# Patient Record
Sex: Male | Born: 1965 | Race: White | Hispanic: No | Marital: Single | Smoking: Current every day smoker
Health system: Southern US, Community
[De-identification: ages and names within clinical notes are randomized; demographics above are authoritative.]

## PROBLEM LIST (undated history)

## (undated) DIAGNOSIS — F1490 Cocaine use, unspecified, uncomplicated: Secondary | ICD-10-CM

## (undated) DIAGNOSIS — E039 Hypothyroidism, unspecified: Secondary | ICD-10-CM

## (undated) DIAGNOSIS — F1010 Alcohol abuse, uncomplicated: Secondary | ICD-10-CM

## (undated) DIAGNOSIS — J449 Chronic obstructive pulmonary disease, unspecified: Secondary | ICD-10-CM

## (undated) DIAGNOSIS — F32A Depression, unspecified: Secondary | ICD-10-CM

## (undated) DIAGNOSIS — F329 Major depressive disorder, single episode, unspecified: Secondary | ICD-10-CM

## (undated) DIAGNOSIS — R569 Unspecified convulsions: Secondary | ICD-10-CM

## (undated) HISTORY — PX: BACK SURGERY: SHX140

---

## 2016-09-08 ENCOUNTER — Emergency Department
Admission: EM | Admit: 2016-09-08 | Discharge: 2016-09-09 | Disposition: A | Discharge status: Home / Self Care | Payer: Medicaid Other | Attending: Emergency Medicine | Admitting: Emergency Medicine

## 2016-09-08 ENCOUNTER — Encounter: Payer: Self-pay | Admitting: Emergency Medicine

## 2016-09-08 DIAGNOSIS — E872 Acidosis, unspecified: Secondary | ICD-10-CM

## 2016-09-08 DIAGNOSIS — F10920 Alcohol use, unspecified with intoxication, uncomplicated: Secondary | ICD-10-CM

## 2016-09-08 DIAGNOSIS — Z9114 Patient's other noncompliance with medication regimen: Secondary | ICD-10-CM | POA: Insufficient documentation

## 2016-09-08 DIAGNOSIS — F10129 Alcohol abuse with intoxication, unspecified: Secondary | ICD-10-CM | POA: Insufficient documentation

## 2016-09-08 DIAGNOSIS — E162 Hypoglycemia, unspecified: Secondary | ICD-10-CM | POA: Insufficient documentation

## 2016-09-08 DIAGNOSIS — F17200 Nicotine dependence, unspecified, uncomplicated: Secondary | ICD-10-CM | POA: Insufficient documentation

## 2016-09-08 HISTORY — DX: Unspecified convulsions: R56.9

## 2016-09-08 HISTORY — DX: Alcohol abuse, uncomplicated: F10.10

## 2016-09-08 LAB — CBC WITH DIFFERENTIAL/PLATELET
BASOS PCT: 2 %
Basophils Absolute: 0.2 10*3/uL — ABNORMAL HIGH (ref 0–0.1)
EOS PCT: 1 %
Eosinophils Absolute: 0.2 10*3/uL (ref 0–0.7)
HEMATOCRIT: 49.5 % (ref 40.0–52.0)
HEMOGLOBIN: 16.5 g/dL (ref 13.0–18.0)
Lymphocytes Relative: 38 %
Lymphs Abs: 4.4 10*3/uL — ABNORMAL HIGH (ref 1.0–3.6)
MCH: 30.2 pg (ref 26.0–34.0)
MCHC: 33.3 g/dL (ref 32.0–36.0)
MCV: 90.6 fL (ref 80.0–100.0)
MONO ABS: 0.6 10*3/uL (ref 0.2–1.0)
Monocytes Relative: 5 %
Neutro Abs: 6.3 10*3/uL (ref 1.4–6.5)
Neutrophils Relative %: 54 %
Platelets: 371 10*3/uL (ref 150–440)
RBC: 5.47 MIL/uL (ref 4.40–5.90)
RDW: 15.5 % — ABNORMAL HIGH (ref 11.5–14.5)
WBC: 11.6 10*3/uL — AB (ref 3.8–10.6)

## 2016-09-08 LAB — URINE DRUG SCREEN, QUALITATIVE (ARMC ONLY)
Amphetamines, Ur Screen: NOT DETECTED
BARBITURATES, UR SCREEN: NOT DETECTED
Benzodiazepine, Ur Scrn: NOT DETECTED
CANNABINOID 50 NG, UR ~~LOC~~: NOT DETECTED
COCAINE METABOLITE, UR ~~LOC~~: NOT DETECTED
MDMA (Ecstasy)Ur Screen: NOT DETECTED
METHADONE SCREEN, URINE: NOT DETECTED
OPIATE, UR SCREEN: NOT DETECTED
PHENCYCLIDINE (PCP) UR S: NOT DETECTED
Tricyclic, Ur Screen: NOT DETECTED

## 2016-09-08 LAB — COMPREHENSIVE METABOLIC PANEL
ALBUMIN: 4.2 g/dL (ref 3.5–5.0)
ALT: 18 U/L (ref 17–63)
ANION GAP: 16 — AB (ref 5–15)
AST: 27 U/L (ref 15–41)
Alkaline Phosphatase: 82 U/L (ref 38–126)
BILIRUBIN TOTAL: 0.4 mg/dL (ref 0.3–1.2)
BUN: 14 mg/dL (ref 6–20)
CO2: 18 mmol/L — ABNORMAL LOW (ref 22–32)
Calcium: 8.5 mg/dL — ABNORMAL LOW (ref 8.9–10.3)
Chloride: 109 mmol/L (ref 101–111)
Creatinine, Ser: 0.83 mg/dL (ref 0.61–1.24)
GFR calc non Af Amer: >60 mL/min (ref >60)
GLUCOSE: 70 mg/dL (ref 65–99)
POTASSIUM: 3.2 mmol/L — AB (ref 3.5–5.1)
Sodium: 143 mmol/L (ref 135–145)
TOTAL PROTEIN: 7.8 g/dL (ref 6.5–8.1)

## 2016-09-08 LAB — GLUCOSE, CAPILLARY
GLUCOSE-CAPILLARY: 192 mg/dL — AB (ref 65–99)
Glucose-Capillary: 44 mg/dL — CL (ref 65–99)

## 2016-09-08 LAB — LIPASE, BLOOD: LIPASE: 19 U/L (ref 11–51)

## 2016-09-08 LAB — OSMOLALITY: Osmolality: 378 mOsm/kg (ref 275–295)

## 2016-09-08 LAB — ACETAMINOPHEN LEVEL: Acetaminophen (Tylenol), Serum: <10 ug/mL — ABNORMAL LOW (ref 10–30)

## 2016-09-08 LAB — SALICYLATE LEVEL

## 2016-09-08 LAB — ETHANOL: ALCOHOL ETHYL (B): 313 mg/dL — AB (ref <5)

## 2016-09-08 MED ORDER — DEXTROSE 50 % IV SOLN
INTRAVENOUS | Status: AC
  Administered 2016-09-08: 50 mL via INTRAVENOUS
  Filled 2016-09-08: qty 50

## 2016-09-08 MED ORDER — SODIUM CHLORIDE 0.9 % IV BOLUS (SEPSIS)
1000.0000 mL | Freq: Once | INTRAVENOUS | Status: AC
  Administered 2016-09-08: 1000 mL via INTRAVENOUS

## 2016-09-08 MED ORDER — THIAMINE HCL 100 MG/ML IJ SOLN
100.0000 mg | Freq: Once | INTRAMUSCULAR | Status: AC
  Administered 2016-09-08: 100 mg via INTRAVENOUS
  Filled 2016-09-08: qty 2

## 2016-09-08 MED ORDER — FOLIC ACID 5 MG/ML IJ SOLN
1.0000 mg | Freq: Every day | INTRAMUSCULAR | Status: DC
  Administered 2016-09-08: 1 mg via INTRAVENOUS
  Filled 2016-09-08 (×2): qty 0.2

## 2016-09-08 MED ORDER — DEXTROSE 50 % IV SOLN
50.0000 mL | Freq: Once | INTRAVENOUS | Status: AC
  Administered 2016-09-08: 50 mL via INTRAVENOUS

## 2016-09-08 NOTE — ED Notes (Signed)
Pt eating sandwich tray and water to drink

## 2016-09-08 NOTE — ED Provider Notes (Signed)
[facility] Emergency Department Provider Note  ____________________________________________  Time seen: Approximately 7:18 PM  I have reviewed the triage vital signs and the nursing notes.   HISTORY  Chief Complaint Alcohol Intoxication and Hypoglycemia Level 5 caveat:  Portions of the history and physical were unable to be obtained due to: Altered mental status due to intoxication    HPI Rodney Watson is a 51 y.o. male who reports drinking 2.5 bottles of Listerine today. Reports she normally drinks a gallon of liquor daily. A seizure earlier today and a month ago due to medication noncompliance. He supposed to be on Depakote and Dilantin. Denies any acute pain anywhere. Not short of breath no fever or vomiting.     Past Medical History:  Diagnosis Date  . Alcohol abuse   . Seizures (HCC)      There are no active problems to display for this patient.    History reviewed. No pertinent surgical history.   Prior to Admission medications   Not on File  None currently, noncompliant with Depakote and phenytoin   Allergies Chocolate   No family history on file.  Social History Social History  Substance Use Topics  . Smoking status: Current Some Day Smoker  . Smokeless tobacco: Current User  . Alcohol use Yes  Marijuana use  Review of Systems  Constitutional:   No fever or chills.  ENT:   No sore throat. No rhinorrhea. Cardiovascular:   No chest pain or syncope. Respiratory:   No dyspnea or cough. Gastrointestinal:   Negative for abdominal pain, vomiting and diarrhea.  Musculoskeletal:   Negative for focal pain or swelling All other systems reviewed and are negative except as documented above in ROS and HPI.  ____________________________________________   PHYSICAL EXAM:  VITAL SIGNS: ED Triage Vitals  Enc Vitals Group     BP 09/08/16 1819 116/73     Pulse Rate 09/08/16 1819 (!) 103     Resp 09/08/16 1819 16     Temp 09/08/16  1819 97.9 F (36.6 C)     Temp Source 09/08/16 1819 Oral     SpO2 09/08/16 1818 96 %     Weight --      Height --      Head Circumference --      Peak Flow --      Pain Score --      Pain Loc --      Pain Edu? --      Excl. in GC? --     Vital signs reviewed, nursing assessments reviewed.   Constitutional:   Alert and orientedTo person and place. Not in distress. Eyes:   No scleral icterus.  EOMI. No nystagmus. No conjunctival pallor. PERRL. ENT   Head:   Normocephalic and atraumatic.   Nose:   No congestion/rhinnorhea.    Mouth/Throat:   Dry mucous membranes, no pharyngeal erythema. No peritonsillar mass. Minty breath   Neck:   No meningismus. Full ROM Hematological/Lymphatic/Immunilogical:   No cervical lymphadenopathy. Cardiovascular:   Tachycardia heart rate 105. Symmetric bilateral radial and DP pulses.  No murmurs.  Respiratory:   Normal respiratory effort without tachypnea/retractions. Breath sounds are clear and equal bilaterally. No wheezes/rales/rhonchi. Gastrointestinal:   Soft and nontender. Non distended. There is no CVA tenderness.  No rebound, rigidity, or guarding. Genitourinary:   deferred Musculoskeletal:   Normal range of motion in all extremities. No joint effusions.  No lower extremity tenderness.  No edema. Neurologic:   Normal speech  and language.  Motor grossly intact. No gross focal neurologic deficits are appreciated.  Skin:    Skin is warm, dry and intact. No rash noted.  No petechiae, purpura, or bullae.  ____________________________________________    LABS (pertinent positives/negatives) (all labs ordered are listed, but only abnormal results are displayed) Labs Reviewed  ACETAMINOPHEN LEVEL - Abnormal; Notable for the following:       Result Value   Acetaminophen (Tylenol), Serum <10 (*)    All other components within normal limits  COMPREHENSIVE METABOLIC PANEL - Abnormal; Notable for the following:    Potassium 3.2 (*)    CO2  18 (*)    Calcium 8.5 (*)    Anion gap 16 (*)    All other components within normal limits  ETHANOL - Abnormal; Notable for the following:    Alcohol, Ethyl (B) 313 (*)    All other components within normal limits  CBC WITH DIFFERENTIAL/PLATELET - Abnormal; Notable for the following:    WBC 11.6 (*)    RDW 15.5 (*)    Lymphs Abs 4.4 (*)    Basophils Absolute 0.2 (*)    All other components within normal limits  GLUCOSE, CAPILLARY - Abnormal; Notable for the following:    Glucose-Capillary 44 (*)    All other components within normal limits  LIPASE, BLOOD  SALICYLATE LEVEL  URINE DRUG SCREEN, QUALITATIVE ([facility] ONLY)  OSMOLALITY   ____________________________________________   EKG    ____________________________________________    RADIOLOGY  No results found.  ____________________________________________   PROCEDURES Procedures CRITICAL CARE Performed by: Scotty Court, Jaxzen Vanhorn   Total critical care time: 35 minutes  Critical care time was exclusive of separately billable procedures and treating other patients.  Critical care was necessary to treat or prevent imminent or life-threatening deterioration.  Critical care was time spent personally by me on the following activities: development of treatment plan with patient and/or surrogate as well as nursing, discussions with consultants, evaluation of patient's response to treatment, examination of patient, obtaining history from patient or surrogate, ordering and performing treatments and interventions, ordering and review of laboratory studies, ordering and review of radiographic studies, pulse oximetry and re-evaluation of patient's condition.  ____________________________________________   INITIAL IMPRESSION / ASSESSMENT AND PLAN / ED COURSE  Pertinent labs & imaging results that were available during my care of the patient were reviewed by me and considered in my medical decision making (see chart for  details).  Patient presents with intoxication, concern for salicylate toxicity as well. History not consistent with other toxic alcohol ingestion. We'll need to monitor in the ED until clinically sober given his homelessness and lack of other people to care for him.  Clinical Course as of Sep 08 2340  Sat Sep 08, 2016  1938 OBSERVATION CARE: This patient is being placed under observation care for the following reasons: Questionable overdose observed to r/o significant toxicity   [PS]  2000 Serum Osm gap is -1.5, within normal range. No evidence of unmeasured osmoles such as toxic alcohol at this time. Osmolality: (!!) 378 [PS]  2340 END OF OBSERVATION STATUS: After an appropriate period of observation, this patient is clinically progressing as expected and likely to be discharged due to improving mental status and lack of neurologic deterioration or other signs of toxicity.  Not yet clinically sober. Care signed out to Dr. Dolores Frame to continue monitoring to sobriety so that patient will be able to care for himself once discharged.   [PS]    Clinical Course User  Index [PS] Sharman Cheek, MD     ____________________________________________   FINAL CLINICAL IMPRESSION(S) / ED DIAGNOSES  Final diagnoses:  Alcoholic intoxication without complication (HCC)  Metabolic acidosis  Hypoglycemia      New Prescriptions   No medications on file     Portions of this note were generated with dragon dictation software. Dictation errors may occur despite best attempts at proofreading.    Sharman Cheek, MD 09/08/16 [phone]
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
## 2016-09-09 NOTE — ED Notes (Addendum)
Pt provided breakfast tray upon discharge.  Pt A/Ox4 and ambulatory with steady gait.  Pt provided phone to call his cousin prior to being placed in lobby.

## 2016-09-09 NOTE — ED Notes (Signed)
Pt up to bathroom with steady gait and then returned to bed. No complaints voiced at this time.

## 2016-09-09 NOTE — ED Notes (Signed)
Patient is resting at this time.

## 2016-09-09 NOTE — ED Provider Notes (Signed)
-----------------------------------------   7:04 AM on 09/09/2016 -----------------------------------------  Patient slept overnight.He did get up to use the restroom but promptly fell back asleep. Anticipate discharge home once patient is sober and ambulatory with steady gait. Care transferred to Dr. Darnelle CatalanMalinda.   Irean HongSung, Inza Mikrut J, MD 09/09/16 [phone]

## 2016-09-09 NOTE — ED Provider Notes (Signed)
Patient is now awake alert feeling well and called his relatives to come get him I will discharge him.   Rodney Watson, [name], MD 09/09/16 [phone]

## 2016-09-09 NOTE — Discharge Instructions (Signed)
Please return for any further problems. Be sure to eat regularly. Follow-up with [facility] if needed and AA.

## 2016-09-14 ENCOUNTER — Encounter: Payer: Self-pay | Admitting: Emergency Medicine

## 2016-09-14 ENCOUNTER — Emergency Department
Admission: EM | Admit: 2016-09-14 | Discharge: 2016-09-14 | Disposition: A | Discharge status: Home / Self Care | Payer: Medicaid Other | Attending: Emergency Medicine | Admitting: Emergency Medicine

## 2016-09-14 DIAGNOSIS — F17200 Nicotine dependence, unspecified, uncomplicated: Secondary | ICD-10-CM | POA: Diagnosis not present

## 2016-09-14 DIAGNOSIS — F10920 Alcohol use, unspecified with intoxication, uncomplicated: Secondary | ICD-10-CM | POA: Insufficient documentation

## 2016-09-14 DIAGNOSIS — T50902A Poisoning by unspecified drugs, medicaments and biological substances, intentional self-harm, initial encounter: Secondary | ICD-10-CM | POA: Diagnosis not present

## 2016-09-14 DIAGNOSIS — R4182 Altered mental status, unspecified: Secondary | ICD-10-CM | POA: Diagnosis present

## 2016-09-14 DIAGNOSIS — F17290 Nicotine dependence, other tobacco product, uncomplicated: Secondary | ICD-10-CM | POA: Diagnosis not present

## 2016-09-14 LAB — COMPREHENSIVE METABOLIC PANEL
ALBUMIN: 3.9 g/dL (ref 3.5–5.0)
ALT: 14 U/L — ABNORMAL LOW (ref 17–63)
ANION GAP: 11 (ref 5–15)
AST: 27 U/L (ref 15–41)
Alkaline Phosphatase: 72 U/L (ref 38–126)
BILIRUBIN TOTAL: 1.1 mg/dL (ref 0.3–1.2)
BUN: 14 mg/dL (ref 6–20)
CALCIUM: 8.3 mg/dL — AB (ref 8.9–10.3)
CO2: 24 mmol/L (ref 22–32)
Chloride: 100 mmol/L — ABNORMAL LOW (ref 101–111)
Creatinine, Ser: 0.75 mg/dL (ref 0.61–1.24)
Glucose, Bld: 180 mg/dL — ABNORMAL HIGH (ref 65–99)
POTASSIUM: 3.1 mmol/L — AB (ref 3.5–5.1)
Sodium: 135 mmol/L (ref 135–145)
Total Protein: 6.8 g/dL (ref 6.5–8.1)

## 2016-09-14 LAB — CBC WITH DIFFERENTIAL/PLATELET
Basophils Absolute: 0 10*3/uL (ref 0–0.1)
Basophils Relative: 0 %
Eosinophils Absolute: 0 10*3/uL (ref 0–0.7)
Eosinophils Relative: 1 %
HEMATOCRIT: 42.1 % (ref 40.0–52.0)
HEMOGLOBIN: 14.4 g/dL (ref 13.0–18.0)
LYMPHS ABS: 1.1 10*3/uL (ref 1.0–3.6)
LYMPHS PCT: 19 %
MCH: 31.1 pg (ref 26.0–34.0)
MCHC: 34.2 g/dL (ref 32.0–36.0)
MCV: 91.1 fL (ref 80.0–100.0)
MONO ABS: 0.4 10*3/uL (ref 0.2–1.0)
Monocytes Relative: 7 %
NEUTROS ABS: 4.2 10*3/uL (ref 1.4–6.5)
NEUTROS PCT: 73 %
Platelets: 202 10*3/uL (ref 150–440)
RBC: 4.62 MIL/uL (ref 4.40–5.90)
RDW: 15.2 % — ABNORMAL HIGH (ref 11.5–14.5)
WBC: 5.8 10*3/uL (ref 3.8–10.6)

## 2016-09-14 LAB — ACETAMINOPHEN LEVEL: Acetaminophen (Tylenol), Serum: <10 ug/mL — ABNORMAL LOW (ref 10–30)

## 2016-09-14 LAB — LIPASE, BLOOD: Lipase: 22 U/L (ref 11–51)

## 2016-09-14 LAB — ETHANOL: Alcohol, Ethyl (B): 185 mg/dL — ABNORMAL HIGH (ref <5)

## 2016-09-14 LAB — SALICYLATE LEVEL: Salicylate Lvl: <7 mg/dL (ref 2.8–30.0)

## 2016-09-14 MED ORDER — SODIUM CHLORIDE 0.9 % IV BOLUS (SEPSIS)
1000.0000 mL | Freq: Once | INTRAVENOUS | Status: AC
  Administered 2016-09-14: 1000 mL via INTRAVENOUS

## 2016-09-14 NOTE — BH Assessment (Signed)
TTS consult attempted. Pt states he does not feel well and instructs clinician to assess at later time.

## 2016-09-14 NOTE — Discharge Instructions (Signed)
Please continue your medicines. Please follow-up with [facility]. You can also follow-up with Alcoholics Anonymous. Remember they have meetings multiple times a day every day. Please return here for any further problems..Marland Kitchen

## 2016-09-14 NOTE — ED Notes (Addendum)
Bed alarm placed on pt per charge nurse

## 2016-09-14 NOTE — ED Triage Notes (Signed)
Pt to ED by EMS after "friends" called due to pt being Unresponsive. Upon arrival the pt was alert and told EMS that he was trying to kill himself.  Pt is homeless and claims he took 20 mucinex in an attempt to kill himself. EMS administered nasal narcan with no change. EMS states that the pt's CBG was 51 and they administered 1 amp of D50 which brought the pt's CBG to 313. Pt has vomited at least 2x. Pt is able to answer questions for triage.

## 2016-09-14 NOTE — ED Notes (Signed)
SOC completed - pt to discharge to home  Pt reports that he wishes to go to the homeless shelter - bus ticket to be provided at discharge so the red line can take him downtown to the shelter  Pt verbalizes agreement with the plan  Information for [facility] provided to him also

## 2016-09-14 NOTE — ED Provider Notes (Signed)
[facility] Emergency Department Provider Note  ____________________________________________  Time seen: Approximately 1:57 PM  I have reviewed the triage vital signs and the nursing notes.   HISTORY  Chief Complaint Altered Mental Status   Level 5 caveat:  Portions of the history and physical were unable to be obtained due to: Altered mental status due to intoxication  HPI Rodney Watson is a 51 y.o. male brought to the ED by EMS due to being unresponsive. They tried Narcan without relief. Patient is homeless and reports that he took an intentional overdose of Mucinex, 20 tablets, and an attempt to kill himself. He reports that he "don't want to be here no more." Not able to explain why. Denies drinking today, but is slurring his speech and smells of Listerine, which I know from previous expense with this patient to be a frequent substance of abuse for him.He reports he is supposed to take Depakote for seizures but has been out for 3 months.     Past Medical History:  Diagnosis Date  . Alcohol abuse   . Seizures (HCC)      There are no active problems to display for this patient.    History reviewed. No pertinent surgical history.   Prior to Admission medications   Not on File  None   Allergies Chocolate   History reviewed. No pertinent family history.  Social History Social History  Substance Use Topics  . Smoking status: Current Some Day Smoker  . Smokeless tobacco: Current User  . Alcohol use Yes    Review of Systems  Constitutional:   No fever or chills.  ENT:   No sore throat. No rhinorrhea. Cardiovascular:   No chest pain or syncope. Respiratory:   No dyspnea or cough. Gastrointestinal:   Negative for abdominal pain,Positive vomiting Musculoskeletal:   Negative for focal pain or swelling All other systems reviewed and are negative except as documented above in ROS and  HPI.  ____________________________________________   PHYSICAL EXAM:  VITAL SIGNS: ED Triage Vitals  Enc Vitals Group     BP 09/14/16 1345 (!) 127/91     Pulse Rate 09/14/16 1345 93     Resp --      Temp 09/14/16 1345 98.5 F (36.9 C)     Temp Source 09/14/16 1345 Oral     SpO2 09/14/16 1345 97 %     Weight 09/14/16 1340 160 lb (72.6 kg)     Height 09/14/16 1340 5\' 10"  (1.778 m)     Head Circumference --      Peak Flow --      Pain Score --      Pain Loc --      Pain Edu? --      Excl. in GC? --     Vital signs reviewed, nursing assessments reviewed.   Constitutional:   Alert and orientedTo person and place. Well appearing and in no distress. Eyes:   No scleral icterus.  EOMI. No nystagmus. No conjunctival pallor. PERRL. ENT   Head:   Normocephalic and atraumatic.   Nose:   No congestion/rhinnorhea.    Mouth/Throat:   MMM, no pharyngeal erythema. No peritonsillar mass.    Neck:   No meningismus. Full ROM Hematological/Lymphatic/Immunilogical:   No cervical lymphadenopathy. Cardiovascular:   RRR. Symmetric bilateral radial and DP pulses.  No murmurs.  Respiratory:   Normal respiratory effort without tachypnea/retractions. Breath sounds are clear and equal bilaterally. No wheezes/rales/rhonchi. Gastrointestinal:   Soft and nontender. Non  distended. There is no CVA tenderness.  No rebound, rigidity, or guarding. Genitourinary:   deferred Musculoskeletal:   Normal range of motion in all extremities. No joint effusions.  No lower extremity tenderness.  No edema. No midline spinal tenderness. Neurologic:   Slurred speech Slowed reaction time.  Motor grossly intact.   Skin:    Skin is warm, dry and intact. No rash noted.  No petechiae, purpura, or bullae.  ____________________________________________    LABS (pertinent positives/negatives) (all labs ordered are listed, but only abnormal results are displayed) Labs Reviewed  ACETAMINOPHEN LEVEL   COMPREHENSIVE METABOLIC PANEL  ETHANOL  LIPASE, BLOOD  SALICYLATE LEVEL  CBC WITH DIFFERENTIAL/PLATELET  URINALYSIS, COMPLETE ([facility]) WITH MICROSCOPIC  URINE DRUG SCREEN, QUALITATIVE ([facility] ONLY)  OSMOLALITY   ____________________________________________   EKG    ____________________________________________    RADIOLOGY  No results found.  ____________________________________________   PROCEDURES Procedures  ____________________________________________   INITIAL IMPRESSION / ASSESSMENT AND PLAN / ED COURSE  Pertinent labs & imaging results that were available during my care of the patient were reviewed by me and considered in my medical decision making (see chart for details).  Patient presents with likely alcohol intoxication and suicide attempt by Mucinex overdose. Unlikely to suffer any adverse effects from the Mucinex other than possibly kidney stones. Check labs and EKG. IVC pending sobriety and psych eval. IV fluids.   ----------------------------------------- 3:28 PM on 09/14/2016 -----------------------------------------  Labs unremarkable, ethanol level 180. Salicylate negative. Monitor for clinical sobriety given his homelessness, psych eval.     ____________________________________________   FINAL CLINICAL IMPRESSION(S) / ED DIAGNOSES  Final diagnoses:  Alcoholic intoxication without complication (HCC)  Intentional overdose of drug in tablet form (HCC)      New Prescriptions   No medications on file     Portions of this note were generated with dragon dictation software. Dictation errors may occur despite best attempts at proofreading.    Sharman CheekStafford, Evelio Rueda, MD 09/14/16 [phone]

## 2016-09-14 NOTE — ED Notes (Signed)
Charge notified of pt's SI. No sitter available at this time. RN will place a bed alarm on the patient and ensure that the pt's door remains open with frequent rounding on the pt.

## 2016-09-14 NOTE — ED Notes (Signed)
SOC    CALLED 

## 2016-09-14 NOTE — ED Provider Notes (Addendum)
Patient has been cleared and released by tele-psychiatry. He is currently sleeping. When he wakes up I will go talk to him again and if everything is going well I will discharge him.   Arnaldo NatalMalinda, Lisamarie Coke F, MD 09/14/16 1638 EKG was done earlier shows normal sinus rhythm rate of 94 slight rightward axis no acute ST-T wave changes there is delayed R-wave progression.   Arnaldo NatalMalinda, Natesha Hassey F, MD 09/14/16 [phone]

## 2016-09-14 NOTE — ED Notes (Signed)
Pt asked ED tech to throw his green polo shirt away.
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
## 2016-09-14 NOTE — ED Notes (Addendum)
MD Scotty CourtStafford at bedside. Pt informed EDP that he "OD'd " today and that he was trying to kill himself because he "doesn't want to be here anymore".

## 2016-09-14 NOTE — ED Notes (Signed)
Patient clean and placed on monitor

## 2016-09-14 NOTE — ED Notes (Signed)
PT IVC/ PENDING SOC CONSULT  

## 2016-10-27 ENCOUNTER — Emergency Department
Admission: EM | Admit: 2016-10-27 | Discharge: 2016-10-27 | Disposition: A | Discharge status: Home / Self Care | Payer: Medicaid Other | Attending: Emergency Medicine | Admitting: Emergency Medicine

## 2016-10-27 ENCOUNTER — Encounter: Payer: Self-pay | Admitting: Emergency Medicine

## 2016-10-27 DIAGNOSIS — Y939 Activity, unspecified: Secondary | ICD-10-CM | POA: Insufficient documentation

## 2016-10-27 DIAGNOSIS — T07XXXA Unspecified multiple injuries, initial encounter: Secondary | ICD-10-CM

## 2016-10-27 DIAGNOSIS — F1490 Cocaine use, unspecified, uncomplicated: Secondary | ICD-10-CM | POA: Insufficient documentation

## 2016-10-27 DIAGNOSIS — M25562 Pain in left knee: Secondary | ICD-10-CM | POA: Insufficient documentation

## 2016-10-27 DIAGNOSIS — Y999 Unspecified external cause status: Secondary | ICD-10-CM | POA: Insufficient documentation

## 2016-10-27 DIAGNOSIS — Y92410 Unspecified street and highway as the place of occurrence of the external cause: Secondary | ICD-10-CM | POA: Insufficient documentation

## 2016-10-27 DIAGNOSIS — Z23 Encounter for immunization: Secondary | ICD-10-CM | POA: Diagnosis not present

## 2016-10-27 DIAGNOSIS — T50903A Poisoning by unspecified drugs, medicaments and biological substances, assault, initial encounter: Secondary | ICD-10-CM | POA: Diagnosis not present

## 2016-10-27 DIAGNOSIS — F1010 Alcohol abuse, uncomplicated: Secondary | ICD-10-CM | POA: Insufficient documentation

## 2016-10-27 DIAGNOSIS — S30810A Abrasion of lower back and pelvis, initial encounter: Secondary | ICD-10-CM | POA: Insufficient documentation

## 2016-10-27 DIAGNOSIS — F1290 Cannabis use, unspecified, uncomplicated: Secondary | ICD-10-CM | POA: Insufficient documentation

## 2016-10-27 DIAGNOSIS — S3992XA Unspecified injury of lower back, initial encounter: Secondary | ICD-10-CM | POA: Diagnosis present

## 2016-10-27 DIAGNOSIS — F1910 Other psychoactive substance abuse, uncomplicated: Secondary | ICD-10-CM

## 2016-10-27 LAB — BASIC METABOLIC PANEL
Anion gap: 14 (ref 5–15)
BUN: 15 mg/dL (ref 6–20)
CHLORIDE: 97 mmol/L — AB (ref 101–111)
CO2: 25 mmol/L (ref 22–32)
CREATININE: 1.03 mg/dL (ref 0.61–1.24)
Calcium: 8.7 mg/dL — ABNORMAL LOW (ref 8.9–10.3)
GFR calc Af Amer: >60 mL/min (ref >60)
GFR calc non Af Amer: >60 mL/min (ref >60)
GLUCOSE: 142 mg/dL — AB (ref 65–99)
Potassium: 3.3 mmol/L — ABNORMAL LOW (ref 3.5–5.1)
SODIUM: 136 mmol/L (ref 135–145)

## 2016-10-27 LAB — URINALYSIS, COMPLETE (UACMP) WITH MICROSCOPIC
BILIRUBIN URINE: NEGATIVE
Bacteria, UA: NONE SEEN
Glucose, UA: NEGATIVE mg/dL
HGB URINE DIPSTICK: NEGATIVE
Ketones, ur: 20 mg/dL — AB
LEUKOCYTES UA: NEGATIVE
Nitrite: NEGATIVE
PH: 5 (ref 5.0–8.0)
Protein, ur: 100 mg/dL — AB
RBC / HPF: NONE SEEN RBC/hpf (ref 0–5)
SPECIFIC GRAVITY, URINE: 1.036 — AB (ref 1.005–1.030)

## 2016-10-27 LAB — HEPATIC FUNCTION PANEL
ALK PHOS: 69 U/L (ref 38–126)
ALT: 21 U/L (ref 17–63)
AST: 31 U/L (ref 15–41)
Albumin: 4 g/dL (ref 3.5–5.0)
BILIRUBIN INDIRECT: 0.6 mg/dL (ref 0.3–0.9)
Bilirubin, Direct: 0.2 mg/dL (ref 0.1–0.5)
Total Bilirubin: 0.8 mg/dL (ref 0.3–1.2)
Total Protein: 7.9 g/dL (ref 6.5–8.1)

## 2016-10-27 LAB — URINE DRUG SCREEN, QUALITATIVE (ARMC ONLY)
AMPHETAMINES, UR SCREEN: NOT DETECTED
BENZODIAZEPINE, UR SCRN: NOT DETECTED
Barbiturates, Ur Screen: NOT DETECTED
Cannabinoid 50 Ng, Ur ~~LOC~~: POSITIVE — AB
Cocaine Metabolite,Ur ~~LOC~~: POSITIVE — AB
MDMA (ECSTASY) UR SCREEN: NOT DETECTED
METHADONE SCREEN, URINE: NOT DETECTED
OPIATE, UR SCREEN: NOT DETECTED
Phencyclidine (PCP) Ur S: NOT DETECTED
Tricyclic, Ur Screen: POSITIVE — AB

## 2016-10-27 LAB — CBC WITH DIFFERENTIAL/PLATELET
BASOS ABS: 0.1 10*3/uL (ref 0–0.1)
BASOS PCT: 1 %
EOS PCT: 1 %
Eosinophils Absolute: 0.1 10*3/uL (ref 0–0.7)
HCT: 50.5 % (ref 40.0–52.0)
Hemoglobin: 17.3 g/dL (ref 13.0–18.0)
Lymphocytes Relative: 14 %
Lymphs Abs: 1.9 10*3/uL (ref 1.0–3.6)
MCH: 30.8 pg (ref 26.0–34.0)
MCHC: 34.3 g/dL (ref 32.0–36.0)
MCV: 89.8 fL (ref 80.0–100.0)
MONO ABS: 1.5 10*3/uL — AB (ref 0.2–1.0)
Monocytes Relative: 11 %
Neutro Abs: 10.1 10*3/uL — ABNORMAL HIGH (ref 1.4–6.5)
Neutrophils Relative %: 73 %
Platelets: 112 10*3/uL — ABNORMAL LOW (ref 150–440)
RBC: 5.62 MIL/uL (ref 4.40–5.90)
RDW: 16.3 % — AB (ref 11.5–14.5)
WBC: 13.7 10*3/uL — ABNORMAL HIGH (ref 3.8–10.6)

## 2016-10-27 LAB — ETHANOL: Alcohol, Ethyl (B): 176 mg/dL — ABNORMAL HIGH (ref <5)

## 2016-10-27 MED ORDER — LIDOCAINE HCL 2 % EX GEL
1.0000 "application " | Freq: Once | CUTANEOUS | Status: AC
  Administered 2016-10-27: 10 via TOPICAL

## 2016-10-27 MED ORDER — LIDOCAINE VISCOUS 2 % MT SOLN: 15.0000 mL | Freq: Once | OROMUCOSAL | Status: DC

## 2016-10-27 MED ORDER — SODIUM CHLORIDE 0.9 % IV BOLUS (SEPSIS)
1000.0000 mL | Freq: Once | INTRAVENOUS | Status: AC
  Administered 2016-10-27: 1000 mL via INTRAVENOUS

## 2016-10-27 MED ORDER — CEPHALEXIN 500 MG PO CAPS: 500.0000 mg | ORAL_CAPSULE | Freq: Three times a day (TID) | ORAL | 0 refills | Status: DC

## 2016-10-27 MED ORDER — LIDOCAINE HCL 2 % EX GEL: 1.0000 "application " | Freq: Two times a day (BID) | CUTANEOUS | 0 refills | Status: DC

## 2016-10-27 MED ORDER — LORAZEPAM 2 MG PO TABS
2.0000 mg | ORAL_TABLET | Freq: Once | ORAL | Status: AC
  Administered 2016-10-27: 2 mg via ORAL
  Filled 2016-10-27: qty 1

## 2016-10-27 MED ORDER — TETANUS-DIPHTH-ACELL PERTUSSIS 5-2.5-18.5 LF-MCG/0.5 IM SUSP
0.5000 mL | Freq: Once | INTRAMUSCULAR | Status: AC
  Administered 2016-10-27: 0.5 mL via INTRAMUSCULAR
  Filled 2016-10-27: qty 0.5

## 2016-10-27 MED ORDER — LIDOCAINE HCL 2 % EX GEL
CUTANEOUS | Status: AC
  Administered 2016-10-27: 10 via TOPICAL
  Filled 2016-10-27: qty 10
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
## 2016-10-27 NOTE — ED Notes (Signed)
Bilateral gluteal dressing applied to abrasion.

## 2016-10-27 NOTE — ED Notes (Signed)
Pt resting. Chest rise and fall noted.
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
## 2016-10-27 NOTE — Discharge Instructions (Signed)
Clean abrasions twice a day with mild soap and water. Begin taking Keflex 3 times a day for 10 days to prevent infection. Follow-up with your primary care doctor or [facility] if any concerns or for wound recheck. You may take Tylenol or ibuprofen as needed for pain.  Follow up sooner if any concerns for infection, fever, nausea or vomiting.
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
## 2016-10-27 NOTE — ED Notes (Signed)
Pt ambulatory to restroom. Pt changed back into street clothes. Meal tray provided. Unable to find ride home. Will d/c to lobby per Derrill KayGoodman MD orders.

## 2016-10-27 NOTE — ED Notes (Signed)
Refused vitals 

## 2017-03-30 ENCOUNTER — Emergency Department (HOSPITAL_COMMUNITY)
Admission: EM | Admit: 2017-03-30 | Discharge: 2017-04-01 | Disposition: A | Discharge status: Home / Self Care | Payer: Medicaid Other | Attending: Emergency Medicine | Admitting: Emergency Medicine

## 2017-03-30 ENCOUNTER — Emergency Department (HOSPITAL_COMMUNITY): Payer: Medicaid Other

## 2017-03-30 ENCOUNTER — Encounter (HOSPITAL_COMMUNITY): Payer: Self-pay

## 2017-03-30 DIAGNOSIS — R45851 Suicidal ideations: Secondary | ICD-10-CM | POA: Insufficient documentation

## 2017-03-30 DIAGNOSIS — S0001XA Abrasion of scalp, initial encounter: Secondary | ICD-10-CM | POA: Diagnosis not present

## 2017-03-30 DIAGNOSIS — Z23 Encounter for immunization: Secondary | ICD-10-CM | POA: Diagnosis not present

## 2017-03-30 DIAGNOSIS — S59901A Unspecified injury of right elbow, initial encounter: Secondary | ICD-10-CM | POA: Diagnosis not present

## 2017-03-30 DIAGNOSIS — F17210 Nicotine dependence, cigarettes, uncomplicated: Secondary | ICD-10-CM | POA: Insufficient documentation

## 2017-03-30 DIAGNOSIS — Y939 Activity, unspecified: Secondary | ICD-10-CM | POA: Diagnosis not present

## 2017-03-30 DIAGNOSIS — F10129 Alcohol abuse with intoxication, unspecified: Secondary | ICD-10-CM | POA: Insufficient documentation

## 2017-03-30 DIAGNOSIS — Y999 Unspecified external cause status: Secondary | ICD-10-CM | POA: Diagnosis not present

## 2017-03-30 DIAGNOSIS — F10920 Alcohol use, unspecified with intoxication, uncomplicated: Secondary | ICD-10-CM

## 2017-03-30 DIAGNOSIS — Y929 Unspecified place or not applicable: Secondary | ICD-10-CM | POA: Diagnosis not present

## 2017-03-30 DIAGNOSIS — W19XXXA Unspecified fall, initial encounter: Secondary | ICD-10-CM

## 2017-03-30 DIAGNOSIS — T07XXXA Unspecified multiple injuries, initial encounter: Secondary | ICD-10-CM

## 2017-03-30 DIAGNOSIS — F332 Major depressive disorder, recurrent severe without psychotic features: Secondary | ICD-10-CM | POA: Insufficient documentation

## 2017-03-30 DIAGNOSIS — W1789XA Other fall from one level to another, initial encounter: Secondary | ICD-10-CM | POA: Diagnosis not present

## 2017-03-30 DIAGNOSIS — S0990XA Unspecified injury of head, initial encounter: Secondary | ICD-10-CM | POA: Diagnosis present

## 2017-03-30 HISTORY — DX: Alcohol abuse, uncomplicated: F10.10

## 2017-03-30 LAB — COMPREHENSIVE METABOLIC PANEL
ALT: 14 U/L — ABNORMAL LOW (ref 17–63)
AST: 32 U/L (ref 15–41)
Albumin: 4.3 g/dL (ref 3.5–5.0)
Alkaline Phosphatase: 77 U/L (ref 38–126)
Anion gap: 17 — ABNORMAL HIGH (ref 5–15)
BUN: 8 mg/dL (ref 6–20)
CO2: 19 mmol/L — ABNORMAL LOW (ref 22–32)
Calcium: 8.5 mg/dL — ABNORMAL LOW (ref 8.9–10.3)
Chloride: 100 mmol/L — ABNORMAL LOW (ref 101–111)
Creatinine, Ser: 0.81 mg/dL (ref 0.61–1.24)
GFR calc Af Amer: >60 mL/min (ref >60)
GFR calc non Af Amer: >60 mL/min (ref >60)
Glucose, Bld: 88 mg/dL (ref 65–99)
Potassium: 4.5 mmol/L (ref 3.5–5.1)
Sodium: 136 mmol/L (ref 135–145)
Total Bilirubin: 0.6 mg/dL (ref 0.3–1.2)
Total Protein: 7.4 g/dL (ref 6.5–8.1)

## 2017-03-30 LAB — CBC WITH DIFFERENTIAL/PLATELET
Basophils Absolute: 0.1 10*3/uL (ref 0.0–0.1)
Basophils Relative: 1 %
Eosinophils Absolute: 0.1 10*3/uL (ref 0.0–0.7)
Eosinophils Relative: 1 %
HCT: 50.6 % (ref 39.0–52.0)
Hemoglobin: 16.9 g/dL (ref 13.0–17.0)
Lymphocytes Relative: 51 %
Lymphs Abs: 5.5 10*3/uL — ABNORMAL HIGH (ref 0.7–4.0)
MCH: 31.1 pg (ref 26.0–34.0)
MCHC: 33.4 g/dL (ref 30.0–36.0)
MCV: 93 fL (ref 78.0–100.0)
Monocytes Absolute: 0.9 10*3/uL (ref 0.1–1.0)
Monocytes Relative: 8 %
Neutro Abs: 4.3 10*3/uL (ref 1.7–7.7)
Neutrophils Relative %: 39 %
Platelets: 295 10*3/uL (ref 150–400)
RBC: 5.44 MIL/uL (ref 4.22–5.81)
RDW: 14.9 % (ref 11.5–15.5)
WBC: 10.9 10*3/uL — ABNORMAL HIGH (ref 4.0–10.5)

## 2017-03-30 LAB — I-STAT CG4 LACTIC ACID, ED
Lactic Acid, Venous: 5.83 mmol/L (ref 0.5–1.9)
Lactic Acid, Venous: 2.74 mmol/L (ref 0.5–1.9)

## 2017-03-30 LAB — ETHANOL: Alcohol, Ethyl (B): 339 mg/dL (ref <10)

## 2017-03-30 MED ORDER — SODIUM CHLORIDE 0.9 % IV BOLUS (SEPSIS)
1000.0000 mL | Freq: Once | INTRAVENOUS | Status: AC
  Administered 2017-03-30: 1000 mL via INTRAVENOUS

## 2017-03-30 MED ORDER — FENTANYL CITRATE (PF) 100 MCG/2ML IJ SOLN
50.0000 ug | INTRAMUSCULAR | Status: DC | PRN
  Administered 2017-03-30: 50 ug via INTRAVENOUS
  Filled 2017-03-30: qty 2

## 2017-03-30 MED ORDER — TETANUS-DIPHTH-ACELL PERTUSSIS 5-2.5-18.5 LF-MCG/0.5 IM SUSP
0.5000 mL | Freq: Once | INTRAMUSCULAR | Status: AC
  Administered 2017-03-30: 0.5 mL via INTRAMUSCULAR
  Filled 2017-03-30: qty 0.5

## 2017-03-30 MED ORDER — IOPAMIDOL (ISOVUE-300) INJECTION 61%
INTRAVENOUS | Status: AC
  Administered 2017-03-30: 100 mL via INTRAVENOUS
  Filled 2017-03-30: qty 100

## 2017-03-30 MED ORDER — SODIUM CHLORIDE 0.9 % IV SOLN
INTRAVENOUS | Status: DC
  Administered 2017-03-30: 19:00:00 via INTRAVENOUS

## 2017-03-30 NOTE — ED Notes (Signed)
Pt taken to CT.

## 2017-03-30 NOTE — ED Notes (Signed)
Monique RN made aware of patient's statements about not wanting to be here anymore. Made aware to reassess when patient is more sober.
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
## 2017-03-30 NOTE — Progress Notes (Signed)
Checked in with the nurse for this patient to see if there were any needs for him that she knew of.  Patient is sleep.  Suspected ETOH.  No needs at t   03/30/17 2017  Clinical Encounter Type  Visited With Health care provider  Visit Type Follow-up;Spiritual support;ED;Trauma  is time.  Spiritual Care services are available should they be needed.
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
## 2017-03-31 ENCOUNTER — Other Ambulatory Visit: Payer: Self-pay

## 2017-03-31 DIAGNOSIS — F333 Major depressive disorder, recurrent, severe with psychotic symptoms: Secondary | ICD-10-CM | POA: Diagnosis not present

## 2017-03-31 DIAGNOSIS — R45851 Suicidal ideations: Secondary | ICD-10-CM | POA: Diagnosis not present

## 2017-03-31 LAB — RAPID URINE DRUG SCREEN, HOSP PERFORMED
AMPHETAMINES: NOT DETECTED
BARBITURATES: NOT DETECTED
Benzodiazepines: NOT DETECTED
Cocaine: NOT DETECTED
OPIATES: NOT DETECTED
TETRAHYDROCANNABINOL: NOT DETECTED

## 2017-03-31 LAB — I-STAT CG4 LACTIC ACID, ED
LACTIC ACID, VENOUS: 2.27 mmol/L — AB (ref 0.5–1.9)
Lactic Acid, Venous: 1.25 mmol/L (ref 0.5–1.9)

## 2017-03-31 LAB — ETHANOL: Alcohol, Ethyl (B): <10 mg/dL (ref <10)

## 2017-03-31 MED ORDER — SODIUM CHLORIDE 0.9 % IV BOLUS (SEPSIS)
1000.0000 mL | Freq: Once | INTRAVENOUS | Status: AC
  Administered 2017-03-31: 1000 mL via INTRAVENOUS

## 2017-03-31 MED ORDER — VITAMIN B-1 100 MG PO TABS
100.0000 mg | ORAL_TABLET | Freq: Every day | ORAL | Status: DC
  Administered 2017-04-01: 100 mg via ORAL
  Filled 2017-03-31: qty 1

## 2017-03-31 MED ORDER — DIAZEPAM 5 MG/ML IJ SOLN
10.0000 mg | Freq: Once | INTRAMUSCULAR | Status: AC
  Administered 2017-03-31: 10 mg via INTRAVENOUS
  Filled 2017-03-31: qty 2

## 2017-03-31 MED ORDER — LORAZEPAM 2 MG/ML IJ SOLN
0.0000 mg | Freq: Four times a day (QID) | INTRAMUSCULAR | Status: DC
  Administered 2017-03-31: 1 mg via INTRAVENOUS
  Filled 2017-03-31: qty 1

## 2017-03-31 MED ORDER — LORAZEPAM 1 MG PO TABS
0.0000 mg | ORAL_TABLET | Freq: Four times a day (QID) | ORAL | Status: DC
  Administered 2017-03-31 (×2): 1 mg via ORAL
  Administered 2017-04-01: 2 mg via ORAL
  Administered 2017-04-01: 1 mg via ORAL
  Filled 2017-03-31: qty 1
  Filled 2017-03-31: qty 2
  Filled 2017-03-31 (×2): qty 1

## 2017-03-31 MED ORDER — THIAMINE HCL 100 MG/ML IJ SOLN
100.0000 mg | Freq: Every day | INTRAMUSCULAR | Status: DC
  Administered 2017-03-31: 100 mg via INTRAVENOUS
  Filled 2017-03-31: qty 2

## 2017-03-31 MED ORDER — ONDANSETRON HCL 4 MG PO TABS: 4.0000 mg | ORAL_TABLET | Freq: Three times a day (TID) | ORAL | Status: DC | PRN

## 2017-03-31 MED ORDER — LORAZEPAM 1 MG PO TABS: 0.0000 mg | ORAL_TABLET | Freq: Two times a day (BID) | ORAL | Status: DC

## 2017-03-31 MED ORDER — ONDANSETRON HCL 4 MG/2ML IJ SOLN
4.0000 mg | Freq: Once | INTRAMUSCULAR | Status: AC
  Administered 2017-03-31: 4 mg via INTRAVENOUS
  Filled 2017-03-31: qty 2

## 2017-03-31 MED ORDER — LORAZEPAM 2 MG/ML IJ SOLN: 0.0000 mg | Freq: Two times a day (BID) | INTRAMUSCULAR | Status: DC
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
## 2017-03-31 NOTE — ED Notes (Signed)
Pt arrived to F8 ambulatory - wearing hospital gown. Pt changed into burgundy scrubs, wanded by security, fall risk bracelet applied to pt's wrist, fall risk sign placed on door d/t pt unsteady on feet. Pt states he is in ED d/t "I was found in a creek. I drink too much". Pt noted to be calm, cooperative, pleasant. Lunch meal tray ordered for pt.

## 2017-03-31 NOTE — ED Notes (Signed)
[facility] called back and requested ETOH to be re-drawn to ensure below 100. Requested to be called back at [phone] w/results. Dr Phineas RealMabe aware. Order received.

## 2017-03-31 NOTE — ED Notes (Signed)
[facility] called inquiring about pt's referral - advised will call back to advise if has been accepted.

## 2017-03-31 NOTE — ED Notes (Signed)
Pt removed collar from neck without being cleared by EDP-Monique,RN

## 2017-03-31 NOTE — ED Notes (Signed)
Pt asked RN if it was bad to not want to be here anymore-Monique,RN

## 2017-03-31 NOTE — ED Notes (Addendum)
Pt unable to ambulate without assistance; pt does not have good balance and stumbles when standing; pt then confessed to Rancour, MD that he was suicidal with plan to "jump in front of a mack truck"

## 2017-03-31 NOTE — ED Notes (Signed)
Telepsych being performed. 

## 2017-03-31 NOTE — ED Notes (Signed)
No needs from sitter at bedside.
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
## 2017-03-31 NOTE — ED Notes (Signed)
Consulting civil engineerCharge RN at [facility] notified about pt's ETOH level. No beds currently available at facility. Pending discharges tomorrow and will be calling back with an update. [facility] # [phone]

## 2017-03-31 NOTE — ED Notes (Signed)
Pt has 4 black t-shirts, 1 coat, 1 vest, 3 pairs of socks, 1 pair of boots, toiletry items, bag with snacks, and 1 large back pack placed in [facility]. [facility] wallet with bank card, cell phone with ear buds, and a pocket knife-Monique,RN
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
## 2017-03-31 NOTE — [facility] Notes (Signed)
Patient has been re-assessed and recommendation: inpatient psych  [facility] the [facility]: [facility] appropriate beds [place]: no beds  Will await review of referrals and follow up with placement.  Rodney EmoryHannah Martha Soltys LCSW, MSW Clinical Social Work: Optician, dispensingystem Wide Float Coverage for :  [phone]

## 2017-03-31 NOTE — ED Notes (Signed)
TTS in process-Monique,RN  

## 2017-04-01 ENCOUNTER — Inpatient Hospital Stay (HOSPITAL_COMMUNITY)
Admission: AD | Admit: 2017-04-01 | Discharge: 2017-04-06 | DRG: 897 | Disposition: A | Discharge status: Home / Self Care | Payer: Medicaid Other | Source: Intra-hospital | Attending: Psychiatry | Admitting: Psychiatry

## 2017-04-01 ENCOUNTER — Encounter (HOSPITAL_COMMUNITY): Payer: Self-pay | Admitting: Emergency Medicine

## 2017-04-01 ENCOUNTER — Encounter (HOSPITAL_COMMUNITY): Payer: Self-pay | Admitting: *Deleted

## 2017-04-01 ENCOUNTER — Other Ambulatory Visit: Payer: Self-pay

## 2017-04-01 DIAGNOSIS — F332 Major depressive disorder, recurrent severe without psychotic features: Secondary | ICD-10-CM | POA: Diagnosis not present

## 2017-04-01 DIAGNOSIS — F1010 Alcohol abuse, uncomplicated: Secondary | ICD-10-CM

## 2017-04-01 DIAGNOSIS — Z885 Allergy status to narcotic agent status: Secondary | ICD-10-CM

## 2017-04-01 DIAGNOSIS — J449 Chronic obstructive pulmonary disease, unspecified: Secondary | ICD-10-CM | POA: Diagnosis present

## 2017-04-01 DIAGNOSIS — R569 Unspecified convulsions: Secondary | ICD-10-CM | POA: Diagnosis present

## 2017-04-01 DIAGNOSIS — F10229 Alcohol dependence with intoxication, unspecified: Principal | ICD-10-CM | POA: Diagnosis present

## 2017-04-01 DIAGNOSIS — F17210 Nicotine dependence, cigarettes, uncomplicated: Secondary | ICD-10-CM | POA: Diagnosis present

## 2017-04-01 DIAGNOSIS — F322 Major depressive disorder, single episode, severe without psychotic features: Secondary | ICD-10-CM | POA: Diagnosis present

## 2017-04-01 DIAGNOSIS — R45851 Suicidal ideations: Secondary | ICD-10-CM | POA: Diagnosis present

## 2017-04-01 DIAGNOSIS — F329 Major depressive disorder, single episode, unspecified: Secondary | ICD-10-CM | POA: Diagnosis present

## 2017-04-01 DIAGNOSIS — Z79899 Other long term (current) drug therapy: Secondary | ICD-10-CM

## 2017-04-01 DIAGNOSIS — Z590 Homelessness: Secondary | ICD-10-CM

## 2017-04-01 DIAGNOSIS — F1290 Cannabis use, unspecified, uncomplicated: Secondary | ICD-10-CM | POA: Diagnosis not present

## 2017-04-01 DIAGNOSIS — F39 Unspecified mood [affective] disorder: Secondary | ICD-10-CM | POA: Diagnosis not present

## 2017-04-01 DIAGNOSIS — F331 Major depressive disorder, recurrent, moderate: Secondary | ICD-10-CM | POA: Diagnosis not present

## 2017-04-01 DIAGNOSIS — R450 Nervousness: Secondary | ICD-10-CM | POA: Diagnosis not present

## 2017-04-01 DIAGNOSIS — F17220 Nicotine dependence, chewing tobacco, uncomplicated: Secondary | ICD-10-CM | POA: Diagnosis present

## 2017-04-01 DIAGNOSIS — Z560 Unemployment, unspecified: Secondary | ICD-10-CM | POA: Diagnosis not present

## 2017-04-01 DIAGNOSIS — R451 Restlessness and agitation: Secondary | ICD-10-CM | POA: Diagnosis not present

## 2017-04-01 DIAGNOSIS — S0001XA Abrasion of scalp, initial encounter: Secondary | ICD-10-CM | POA: Diagnosis not present

## 2017-04-01 DIAGNOSIS — F4310 Post-traumatic stress disorder, unspecified: Secondary | ICD-10-CM | POA: Diagnosis present

## 2017-04-01 DIAGNOSIS — G4700 Insomnia, unspecified: Secondary | ICD-10-CM | POA: Diagnosis not present

## 2017-04-01 DIAGNOSIS — F1910 Other psychoactive substance abuse, uncomplicated: Secondary | ICD-10-CM | POA: Diagnosis not present

## 2017-04-01 DIAGNOSIS — Z91018 Allergy to other foods: Secondary | ICD-10-CM | POA: Diagnosis not present

## 2017-04-01 DIAGNOSIS — Y908 Blood alcohol level of 240 mg/100 ml or more: Secondary | ICD-10-CM | POA: Diagnosis present

## 2017-04-01 DIAGNOSIS — F419 Anxiety disorder, unspecified: Secondary | ICD-10-CM | POA: Diagnosis not present

## 2017-04-01 HISTORY — DX: Chronic obstructive pulmonary disease, unspecified: J44.9

## 2017-04-01 HISTORY — DX: Depression, unspecified: F32.A

## 2017-04-01 HISTORY — DX: Major depressive disorder, single episode, unspecified: F32.9

## 2017-04-01 HISTORY — DX: Alcohol abuse, uncomplicated: F10.10

## 2017-04-01 MED ORDER — HYDROXYZINE HCL 25 MG PO TABS
25.0000 mg | ORAL_TABLET | Freq: Four times a day (QID) | ORAL | Status: AC | PRN
  Administered 2017-04-03: 25 mg via ORAL
  Filled 2017-04-01: qty 1

## 2017-04-01 MED ORDER — IBUPROFEN 400 MG PO TABS: 400.0000 mg | ORAL_TABLET | ORAL | Status: DC | PRN

## 2017-04-01 MED ORDER — LORAZEPAM 1 MG PO TABS
1.0000 mg | ORAL_TABLET | Freq: Two times a day (BID) | ORAL | Status: AC
  Administered 2017-04-04 (×2): 1 mg via ORAL
  Filled 2017-04-01 (×2): qty 1

## 2017-04-01 MED ORDER — VITAMIN B-1 100 MG PO TABS
100.0000 mg | ORAL_TABLET | Freq: Every day | ORAL | Status: DC
  Administered 2017-04-02 – 2017-04-06 (×5): 100 mg via ORAL
  Filled 2017-04-01 (×8): qty 1

## 2017-04-01 MED ORDER — LOPERAMIDE HCL 2 MG PO CAPS: 2.0000 mg | ORAL_CAPSULE | ORAL | Status: AC | PRN

## 2017-04-01 MED ORDER — NICOTINE POLACRILEX 2 MG MT GUM
2.0000 mg | CHEWING_GUM | OROMUCOSAL | Status: DC | PRN
  Administered 2017-04-02 – 2017-04-06 (×4): 2 mg via ORAL

## 2017-04-01 MED ORDER — LORAZEPAM 1 MG PO TABS
1.0000 mg | ORAL_TABLET | Freq: Every day | ORAL | Status: AC
  Administered 2017-04-05: 1 mg via ORAL
  Filled 2017-04-01: qty 1

## 2017-04-01 MED ORDER — ADULT MULTIVITAMIN W/MINERALS CH
1.0000 | ORAL_TABLET | Freq: Every day | ORAL | Status: DC
  Administered 2017-04-01 – 2017-04-06 (×6): 1 via ORAL
  Filled 2017-04-01 (×10): qty 1

## 2017-04-01 MED ORDER — ALUM & MAG HYDROXIDE-SIMETH 200-200-20 MG/5ML PO SUSP: 30.0000 mL | ORAL | Status: DC | PRN

## 2017-04-01 MED ORDER — LORAZEPAM 1 MG PO TABS
1.0000 mg | ORAL_TABLET | Freq: Three times a day (TID) | ORAL | Status: AC
  Administered 2017-04-03 (×3): 1 mg via ORAL
  Filled 2017-04-01 (×3): qty 1

## 2017-04-01 MED ORDER — LORAZEPAM 1 MG PO TABS
1.0000 mg | ORAL_TABLET | Freq: Four times a day (QID) | ORAL | Status: AC
  Administered 2017-04-01 – 2017-04-02 (×6): 1 mg via ORAL
  Filled 2017-04-01 (×6): qty 1

## 2017-04-01 MED ORDER — LORAZEPAM 1 MG PO TABS: 1.0000 mg | ORAL_TABLET | Freq: Four times a day (QID) | ORAL | Status: AC | PRN

## 2017-04-01 MED ORDER — THIAMINE HCL 100 MG/ML IJ SOLN
100.0000 mg | Freq: Once | INTRAMUSCULAR | Status: AC
  Administered 2017-04-01: 100 mg via INTRAMUSCULAR
  Filled 2017-04-01: qty 2

## 2017-04-01 MED ORDER — TRAZODONE HCL 50 MG PO TABS
50.0000 mg | ORAL_TABLET | Freq: Every evening | ORAL | Status: DC | PRN
  Administered 2017-04-01 – 2017-04-04 (×2): 50 mg via ORAL
  Filled 2017-04-01 (×3): qty 1

## 2017-04-01 MED ORDER — MAGNESIUM HYDROXIDE 400 MG/5ML PO SUSP: 30.0000 mL | Freq: Every day | ORAL | Status: DC | PRN

## 2017-04-01 MED ORDER — HYDROXYZINE HCL 25 MG PO TABS: 25.0000 mg | ORAL_TABLET | Freq: Three times a day (TID) | ORAL | Status: DC | PRN

## 2017-04-01 MED ORDER — ONDANSETRON 4 MG PO TBDP: 4.0000 mg | ORAL_TABLET | Freq: Four times a day (QID) | ORAL | Status: AC | PRN

## 2017-04-01 NOTE — [facility] Notes (Signed)
D: Pt was in his room upon initial approach.  Pt presents with depressed affect and mood.  Pt is disheveled and in scrubs.  He has abrasion to upper R side of forehead and he reports it is from a fall that occurred prior to admission.  His goal is "just to make it."  Pt reports he has history of seizures and he used to take Dilantin 200 mg PO daily, provider informed and phenytoin level lab ordered for tomorrow.  Pt denies HI, denies hallucinations, denies pain.  When asked if he is suicidal, he reports "that's why I'm here."  He [place]."  [facility].  He has minor tremor and sweats during assessment.  Pt has been visible in milieu interacting with peers and staff appropriately.  Pt attended evening group.    A: Introduced self to pt.  Actively listened to pt and offered support and encouragement. Medication administered per order.  PRN medication administered for sleep.  Fall prevention techniques reviewed with pt and pt verbalized understanding.  Q15 minute safety checks maintained.  R: Pt is safe on the unit.  Pt is compliant with medications.  He reports he will inform staff of needs and concerns.  Pt verbally contracts for safety.  Will continue to monitor and assess.

## 2017-04-01 NOTE — [facility] Notes (Signed)
[facility]  Ferne ReusJustina Okonkwo, NP is the accepting provider.  Dr.Cobos is the attending provider.  Call report to [phone]  [facility] notified.   Pt is Voluntary.  Pt may be transported by [facility].  Carney BernJean T. Kaylyn LimSutter, MSW, LCSWA Disposition Clinical Social Work [phone] (cell) [phone] (office)

## 2017-04-01 NOTE — Tx Team (Addendum)
Initial Treatment Plan 04/01/2017 4:50 PM Rodney Watson [id number]    PATIENT STRESSORS: Financial difficulties Substance abuse   PATIENT STRENGTHS: Ability for insight Average or above average intelligence Communication skills General fund of knowledge Motivation for treatment/growth   PATIENT IDENTIFIED PROBLEMS: Suicidal ideation " I'm tired of life"  Depression "I've been through a lot"  PTSD  Substance Abuse               DISCHARGE CRITERIA:  Improved stabilization in mood, thinking, and/or behavior Motivation to continue treatment in a less acute level of care Need for constant or close observation no longer present Reduction of life-threatening or endangering symptoms to within safe limits Verbal commitment to aftercare and medication compliance Withdrawal symptoms are absent or subacute and managed without 24-hour nursing intervention  PRELIMINARY DISCHARGE PLAN: Outpatient therapy Return to previous living arrangement  PATIENT/FAMILY INVOLVEMENT: This treatment plan has been presented to and reviewed with the patient, Rodney Watson, and/or family member.  The patient and family have been given the opportunity to ask questions and make suggestions.  Hoover BrownsJones, Braxden Lovering Howard, RN 04/01/2017, 4:50 PM

## 2017-04-01 NOTE — ED Notes (Signed)
Lunch tray orderd 

## 2017-04-01 NOTE — [facility] Group Notes (Signed)
[facility] Group Notes:  (Nursing/MHT/Case Management/Adjunct)  Date:  04/01/2017  Time:  4:00 p.m.  Type of Therapy:  Psychoeducational Skills  Participation Level:  Did Not Attend  Participation Quality:    Affect:    Cognitive:    Insight:    Engagement in Group:    Modes of Intervention:    Summary of Progress/Problems:  Patient did not attend.   Earline MayotteKnight, Taiga Lupinacci Shephard 04/01/2017, 6:11 PM

## 2017-04-01 NOTE — ED Notes (Signed)
[facility]  [facility] [place]

## 2017-04-01 NOTE — BH Assessment (Signed)
[facility] Assessment Progress Note Reassessment Pt reports due to medication for withdrawals that he slept well, but reports he still has no appetite. Pt reports he still has SI. Pt was shaking during reassessment. Pt reports he was praying for placement.

## 2017-04-01 NOTE — [facility] Notes (Signed)
Pt attended evening wrap up group and stated today was a 3 for him [facility] and is tired.
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
## 2017-04-02 DIAGNOSIS — Z590 Homelessness: Secondary | ICD-10-CM

## 2017-04-02 DIAGNOSIS — F322 Major depressive disorder, single episode, severe without psychotic features: Secondary | ICD-10-CM | POA: Diagnosis present

## 2017-04-02 LAB — PHENYTOIN LEVEL, TOTAL: Phenytoin Lvl: <2.5 ug/mL — ABNORMAL LOW (ref 10.0–20.0)

## 2017-04-02 LAB — TSH: TSH: 5.749 u[IU]/mL — ABNORMAL HIGH (ref 0.350–4.500)

## 2017-04-02 MED ORDER — PHENYTOIN 50 MG PO CHEW
100.0000 mg | CHEWABLE_TABLET | Freq: Three times a day (TID) | ORAL | Status: DC
  Filled 2017-04-02 (×4): qty 2

## 2017-04-02 MED ORDER — FLUOXETINE HCL 10 MG PO CAPS
10.0000 mg | ORAL_CAPSULE | Freq: Every day | ORAL | Status: DC
  Administered 2017-04-02 – 2017-04-06 (×5): 10 mg via ORAL
  Filled 2017-04-02 (×10): qty 1

## 2017-04-02 MED ORDER — MIRTAZAPINE 15 MG PO TABS
15.0000 mg | ORAL_TABLET | Freq: Every day | ORAL | Status: DC
  Administered 2017-04-02 – 2017-04-05 (×4): 15 mg via ORAL
  Filled 2017-04-02 (×7): qty 1
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
## 2017-04-02 NOTE — Progress Notes (Signed)
Recreation Therapy Notes Animal-Assisted Activity (AAA) Program Checklist/Progress Notes Patient Eligibility Criteria Checklist & Daily Group note for Rec TxIntervention  Date: 2.12.19 Time: 2:45 pm  [facility] [address]   AAA/T Program Assumption of Risk Form signed by Patient/ or Parent Legal Guardian Yes  Patient is free of allergies or sever asthma Yes  Patient reports no fear of animals Yes  Patient reports no history of cruelty to animals Yes  Patient understands his/her participation is voluntary Yes  Behavioral Response: Did not attend   Sheryle Hailarian Nazli Penn, Recreation Therapy Intern  Sheryle HailDarian Chandon Lazcano 04/02/2017 8:36 AM

## 2017-04-02 NOTE — [facility] Group Notes (Signed)
[facility] Group Notes:  (Nursing/MHT/Case Management/Adjunct)  Date:  04/02/2017  Time:  3:30 p.m.  Type of Therapy:  Psychoeducational Skills  Participation Level:  Did Not Attend   Participation Quality:    Affect:    Cognitive:    Insight:   Engagement in Group:   Modes of Intervention:   Summary of Progress/Problems:  Rodney Watson, Rodney Watson 04/02/2017, 5:40 PM

## 2017-04-02 NOTE — [facility] Group Notes (Signed)
[facility] Therapy 04/02/2017 1:15pm  Type of Therapy: Mental Health Association Presentation  Participation Level: DID NOT ATTEND. Pt chose to remain in bed.   Summary of Progress/Problems: Mental Health Association [facility]) Speaker came to talk about his personal journey with mental health. The pt processed ways by which to relate to the speaker. MHA speaker provided handouts and educational information pertaining to groups and services offered by [facility]. Pt was engaged in speaker's presentation and was receptive to resources provided.    Pulte HomesHeather N Smart, LCSW 04/02/2017 3:36 PM

## 2017-04-02 NOTE — Progress Notes (Signed)
DAR NOTE: Patient presents with anxious affect and depressed mood. Pt has been isolating in the room. Has some tremulous from alcohol consumption.  Denies pain, auditory and visual hallucinations.  Rates depression at 7, hopelessness at 6, and anxiety at 8.  Maintained on routine safety checks.  Medications given as prescribed.  Support and encouragement offered as needed.  Did not attended group.  States goal for today is "getting better." Patient observed socializing with peers in the dayroom.  Will continue to monitor.
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
## 2017-04-02 NOTE — Progress Notes (Signed)
D: Pt was in his room upon initial approach.  Pt presents with depressed affect and mood.  He reports his day was "great, I seen the doctor and he put me on some new medication."  His goal is to "stay in the right frame of mind."  Pt denies HI, denies hallucinations, denies pain.  He endorses SI without a plan and verbally contracts for safety.  Pt has withdrawal symptom of mild tremor tonight.  Pt has been visible in milieu interacting with peers and staff appropriately.  Pt attended part of evening group but left early.    A: Introduced self to pt.  Actively listened to pt and offered support and encouragement. Medications administered per order.  Medication education provided.  Q15 minute safety checks maintained.  R: Pt is safe on the unit.  Pt is compliant with medications.  Pt verbally contracts for safety.  Will continue to monitor and assess.
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
## 2017-04-02 NOTE — [facility] Group Notes (Signed)
Adult Psychoeducational Group Note  Date:  04/02/2017 Time:  7:10 PM  Group Topic/Focus:  Activity  Participation Level:  Did Not Attend   Philip AspenLatoya O Shiara Mcgough 04/02/2017, 7:10 PM

## 2017-04-03 DIAGNOSIS — F332 Major depressive disorder, recurrent severe without psychotic features: Secondary | ICD-10-CM

## 2017-04-03 DIAGNOSIS — F17210 Nicotine dependence, cigarettes, uncomplicated: Secondary | ICD-10-CM

## 2017-04-03 MED ORDER — PHENYTOIN SODIUM EXTENDED 100 MG PO CAPS
100.0000 mg | ORAL_CAPSULE | Freq: Three times a day (TID) | ORAL | Status: DC
  Administered 2017-04-03 – 2017-04-06 (×11): 100 mg via ORAL
  Filled 2017-04-03 (×17): qty 1

## 2017-04-03 NOTE — Plan of Care (Signed)
Patient verbalizes understanding of education, information provided. 

## 2017-04-03 NOTE — Progress Notes (Signed)
[facility] MD Progress Note  04/03/2017 12:44 PM Rodney Watson  MRN:  [id number]   Subjective:  Patient reports that he is doing okay today. He reports minimal withdrawal sym[ptoms with the use of medications. He reports sleeping well and having a good appetite. He denies any SI/HI/AVH and contracts for safety. He reports that after detox he jut wants to go to an outpatient treatment and refuses referral to residential programs.    Objective: Patient's chart and findings reviewed and discussed with treatment team. Patient presents in his room sleeping and has remained there most of the day. He has not attended groups yet, but feel this is due to withdrawals. Will continue current medications and the CSW is providing patient with information for outpatient treatment options.   Principal Problem: MDD (major depressive disorder) Diagnosis:   Patient Active Problem List   Diagnosis Date Noted  . MDD (major depressive disorder) [F32.9] 04/02/2017  . Alcohol abuse [F10.10] 04/01/2017   Total Time spent with patient: 15 minutes  Past Psychiatric History: See H&P  Past Medical History:  Past Medical History:  Diagnosis Date  . COPD (chronic obstructive pulmonary disease) (HCC)   . Depression   . ETOH abuse   . Seizures (HCC)    History reviewed. No pertinent surgical history. Family History: History reviewed. No pertinent family history. Family Psychiatric  History: See H&P Social History:  Social History   Substance and Sexual Activity  Alcohol Use Yes   Comment: 5th daily     Social History   Substance and Sexual Activity  Drug Use Yes  . Types: Marijuana   Comment: 1-2 joints/week    Social History   Socioeconomic History  . Marital status: Single    Spouse name: None  . Number of children: None  . Years of education: None  . Highest education level: None  Social Needs  . Financial resource strain: None  . Food insecurity - worry: None  . Food insecurity - inability: None   . Transportation needs - medical: None  . Transportation needs - non-medical: None  Occupational History  . None  Tobacco Use  . Smoking status: Current Every Day Smoker    Packs/day: 1.50    Years: 39.00    Pack years: 58.50    Types: Cigarettes  . Smokeless tobacco: Current User    Types: Chew  Substance and Sexual Activity  . Alcohol use: Yes    Comment: 5th daily  . Drug use: Yes    Types: Marijuana    Comment: 1-2 joints/week  . Sexual activity: No  Other Topics Concern  . None  Social History Narrative  . None   Additional Social History:    History of alcohol / drug use?: Yes Longest period of sobriety (when/how long): (unknown) Negative Consequences of Use: Financial, Personal relationships Withdrawal Symptoms: Seizures, Tremors, Sweats, Nausea / Vomiting Onset of Seizures: unknown Date of most recent seizure: 2 weeks ago   Name of Substance 2: marijuana 2 - Age of First Use: 12 2 - Amount (size/oz): 1-2 joints 2 - Frequency: monthly 2 - Duration: Ongoing 2 - Last Use / Amount: last month                Sleep: Good  Appetite:  Good  Current Medications: Current Facility-Administered Medications  Medication Dose Route Frequency Provider Last Rate Last Dose  . alum & mag hydroxide-simeth (MAALOX/MYLANTA) 200-200-20 MG/5ML suspension 30 mL  30 mL Oral Q4H PRN Oneta Rack, NP      .  FLUoxetine (PROZAC) capsule 10 mg  10 mg Oral Daily Izediuno, Delight Ovens, MD   10 mg at 04/03/17 0913  . hydrOXYzine (ATARAX/VISTARIL) tablet 25 mg  25 mg Oral Q6H PRN Oneta Rack, NP      . ibuprofen (ADVIL,MOTRIN) tablet 400 mg  400 mg Oral Q4H PRN Oneta Rack, NP      . loperamide (IMODIUM) capsule 2-4 mg  2-4 mg Oral PRN Oneta Rack, NP      . LORazepam (ATIVAN) tablet 1 mg  1 mg Oral Q6H PRN Oneta Rack, NP      . LORazepam (ATIVAN) tablet 1 mg  1 mg Oral TID Oneta Rack, NP   1 mg at 04/03/17 1204   Followed by  . [START ON 04/04/2017]  LORazepam (ATIVAN) tablet 1 mg  1 mg Oral BID Oneta Rack, NP       Followed by  . [START ON 04/05/2017] LORazepam (ATIVAN) tablet 1 mg  1 mg Oral Daily Lewis, Tanika N, NP      . magnesium hydroxide (MILK OF MAGNESIA) suspension 30 mL  30 mL Oral Daily PRN Oneta Rack, NP      . mirtazapine (REMERON) tablet 15 mg  15 mg Oral QHS Izediuno, Delight Ovens, MD   15 mg at 04/02/17 2104  . multivitamin with minerals tablet 1 tablet  1 tablet Oral Daily Oneta Rack, NP   1 tablet at 04/03/17 0913  . nicotine polacrilex (NICORETTE) gum 2 mg  2 mg Oral PRN Georgiann Cocker, MD   2 mg at 04/02/17 1341  . ondansetron (ZOFRAN-ODT) disintegrating tablet 4 mg  4 mg Oral Q6H PRN Oneta Rack, NP      . phenytoin (DILANTIN) ER capsule 100 mg  100 mg Oral TID Georgiann Cocker, MD   100 mg at 04/03/17 1202  . thiamine (VITAMIN B-1) tablet 100 mg  100 mg Oral Daily Oneta Rack, NP   100 mg at 04/03/17 0912  . traZODone (DESYREL) tablet 50 mg  50 mg Oral QHS PRN Oneta Rack, NP   50 mg at 04/01/17 2109    Lab Results:  Results for orders placed or performed during the hospital encounter of 04/01/17 (from the past 48 hour(s))  TSH     Status: Abnormal   Collection Time: 04/02/17  6:33 AM  Result Value Ref Range   TSH 5.749 (H) 0.350 - 4.500 uIU/mL    Comment: Performed by a 3rd Generation assay with a functional sensitivity of <=0.01 uIU/mL. Performed at [facility], [address] [place], [place] [address]   Phenytoin level, total     Status: Abnormal   Collection Time: 04/02/17  6:33 AM  Result Value Ref Range   Phenytoin Lvl <2.5 (L) 10.0 - 20.0 ug/mL    Comment: Performed at [facility], [address] [place], [place] [address]    Blood Alcohol level:  Lab Results  Component Value Date   ETH <10 03/31/2017   ETH 339 (HH) 03/30/2017    Metabolic Disorder Labs: No results found for: HGBA1C, MPG No results found for: PROLACTIN No  results found for: CHOL, TRIG, HDL, CHOLHDL, VLDL, LDLCALC  Physical Findings: AIMS: Facial and Oral Movements Muscles of Facial Expression: None, normal Lips and Perioral Area: None, normal Jaw: None, normal Tongue: None, normal,Extremity Movements Upper (arms, wrists, hands, fingers): None, normal Lower (legs, knees, ankles, toes): None, normal, Trunk Movements  Neck, shoulders, hips: None, normal, Overall Severity Severity of abnormal movements (highest score from questions above): None, normal Incapacitation due to abnormal movements: None, normal Patient's awareness of abnormal movements (rate only patient's report): No Awareness, Dental Status Current problems with teeth and/or dentures?: No Does patient usually wear dentures?: No  CIWA:  CIWA-Ar Total: 4 COWS:     Musculoskeletal: Strength & Muscle Tone: within normal limits Gait & Station: normal Patient leans: N/A  Psychiatric Specialty Exam: Physical Exam  Nursing note and vitals reviewed. Constitutional: He is oriented to person, place, and time. He appears well-developed and well-nourished.  Cardiovascular: Normal rate.  Respiratory: Effort normal.  Musculoskeletal: Normal range of motion.  Neurological: He is alert and oriented to person, place, and time.  Skin: Skin is warm.    Review of Systems  Constitutional: Negative.   HENT: Negative.   Eyes: Negative.   Respiratory: Negative.   Cardiovascular: Negative.   Gastrointestinal: Negative.   Genitourinary: Negative.   Musculoskeletal: Negative.   Skin: Negative.   Neurological: Negative.   Endo/Heme/Allergies: Negative.   Psychiatric/Behavioral: Positive for depression and substance abuse. Negative for hallucinations and suicidal ideas. The patient is nervous/anxious.     Blood pressure (!) 138/99, pulse 85, temperature 98.2 F (36.8 C), temperature source Oral, resp. rate 18, height 5' 5.5" (1.664 m).Body mass index is 27.04 kg/m.  General Appearance:  Casual  Eye Contact:  Good  Speech:  Clear and Coherent and Normal Rate  Volume:  Normal  Mood:  Depressed  Affect:  Flat  Thought Process:  Goal Directed and Descriptions of Associations: Intact  Orientation:  Full (Time, Place, and Person)  Thought Content:  WDL  Suicidal Thoughts:  No  Homicidal Thoughts:  No  Memory:  Immediate;   Good Recent;   Good Remote;   Good  Judgement:  Good  Insight:  Good  Psychomotor Activity:  Normal  Concentration:  Concentration: Good and Attention Span: Good  Recall:  Good  Fund of Knowledge:  Good  Language:  Good  Akathisia:  No  Handed:  Right  AIMS (if indicated):     Assets:  Communication Skills Desire for Improvement Financial Resources/Insurance Housing Physical Health Social Support Transportation  ADL's:  Intact  Cognition:  WNL  Sleep:  Number of Hours: 6.5   Problems Addressed: MDD severe Alcohol abuse  Treatment Plan Summary: Daily contact with patient to assess and evaluate symptoms and progress in treatment, Medication management and Plan is to:  -Continue Prozac 10 mg PO Daily for mood control -Continue Ativan Detox Protocol -Continue Remeron 15 mg PO QHS for mood stability -Continue Trazodone 50 mg PO QHS PRN for insomnia -Continue Vistaril 25 mg PO Q6H PRN for anxiety -Encourage group therapy participation  Maryfrances Bunnellravis B Pernella Ackerley, FNP 04/03/2017, 12:44 PM

## 2017-04-03 NOTE — [facility] Group Notes (Signed)
Pt was alert and engaged in therapeutic television topic on vulnerability. 

## 2017-04-03 NOTE — Progress Notes (Signed)
D: Patient observed resting in bed, withdrawn. Patient's gait unsteady however states he does not need to use the wheelchair. Refused staff assist with ambulation. Patient's affect flat, mood depressed. Refused self inventory completion. States goal for today is to "catch up on my rest." Denies pain however is experiencing some withdrawal. Reports slight tremors, sweating.   A: Medicated per orders, no prns requested or required. Level III obs in place for safety. Emotional support offered and self inventory reviewed. Encouraged completion of Suicide Safety Plan and programming participation. Discussed POC with MD, SW.  Fall prevention plan in place and reviewed with patient as pt is a high fall risk due to seizure hx and unsteady gait.   R: Patient verbalizes understanding of POC, falls prevention education.Patient endorsing passive SI however verbally contracts for safety. No HI/AVH and remains safe on level III obs. Will continue to monitor closely and make verbal contact frequently.

## 2017-04-03 NOTE — Tx Team (Signed)
Interdisciplinary Treatment and Diagnostic Plan Update  04/03/2017 Time of Session: 0830AM Rodney Watson MRN: [id number]  Principal Diagnosis: MDD (major depressive disorder)  Secondary Diagnoses: Principal Problem:   MDD (major depressive disorder) Active Problems:   Alcohol abuse   Current Medications:  Current Facility-Administered Medications  Medication Dose Route Frequency Provider Last Rate Last Dose  . alum & mag hydroxide-simeth (MAALOX/MYLANTA) 200-200-20 MG/5ML suspension 30 mL  30 mL Oral Q4H PRN Oneta Rack, NP      . FLUoxetine (PROZAC) capsule 10 mg  10 mg Oral Daily Izediuno, Delight Ovens, MD   10 mg at 04/03/17 0913  . hydrOXYzine (ATARAX/VISTARIL) tablet 25 mg  25 mg Oral Q6H PRN Oneta Rack, NP      . ibuprofen (ADVIL,MOTRIN) tablet 400 mg  400 mg Oral Q4H PRN Oneta Rack, NP      . loperamide (IMODIUM) capsule 2-4 mg  2-4 mg Oral PRN Oneta Rack, NP      . LORazepam (ATIVAN) tablet 1 mg  1 mg Oral Q6H PRN Oneta Rack, NP      . LORazepam (ATIVAN) tablet 1 mg  1 mg Oral TID Oneta Rack, NP   1 mg at 04/03/17 0913   Followed by  . [START ON 04/04/2017] LORazepam (ATIVAN) tablet 1 mg  1 mg Oral BID Oneta Rack, NP       Followed by  . [START ON 04/05/2017] LORazepam (ATIVAN) tablet 1 mg  1 mg Oral Daily Lewis, Tanika N, NP      . magnesium hydroxide (MILK OF MAGNESIA) suspension 30 mL  30 mL Oral Daily PRN Oneta Rack, NP      . mirtazapine (REMERON) tablet 15 mg  15 mg Oral QHS Izediuno, Delight Ovens, MD   15 mg at 04/02/17 2104  . multivitamin with minerals tablet 1 tablet  1 tablet Oral Daily Oneta Rack, NP   1 tablet at 04/03/17 0913  . nicotine polacrilex (NICORETTE) gum 2 mg  2 mg Oral PRN Georgiann Cocker, MD   2 mg at 04/02/17 1341  . ondansetron (ZOFRAN-ODT) disintegrating tablet 4 mg  4 mg Oral Q6H PRN Oneta Rack, NP      . phenytoin (DILANTIN) ER capsule 100 mg  100 mg Oral TID Georgiann Cocker, MD   100 mg at  04/03/17 0913  . thiamine (VITAMIN B-1) tablet 100 mg  100 mg Oral Daily Oneta Rack, NP   100 mg at 04/03/17 0912  . traZODone (DESYREL) tablet 50 mg  50 mg Oral QHS PRN Oneta Rack, NP   50 mg at 04/01/17 2109   PTA Medications: Medications Prior to Admission  Medication Sig Dispense Refill Last Dose  . phenytoin (DILANTIN) 200 MG ER capsule Take 200 mg by mouth daily.   Unknown at Unknown time    Patient Stressors: Financial difficulties Substance abuse  Patient Strengths: Ability for insight Average or above average intelligence Communication skills General fund of knowledge Motivation for treatment/growth  Treatment Modalities: Medication Management, Group therapy, Case management,  1 to 1 session with clinician, Psychoeducation, Recreational therapy.   Physician Treatment Plan for Primary Diagnosis: MDD (major depressive disorder) Long Term Goal(s): Improvement in symptoms so as ready for discharge Improvement in symptoms so as ready for discharge   Short Term Goals: Ability to identify changes in lifestyle to reduce recurrence of condition will improve Ability to verbalize feelings will improve Ability to disclose and discuss  suicidal ideas Ability to demonstrate self-control will improve Ability to identify and develop effective coping behaviors will improve Ability to maintain clinical measurements within normal limits will improve Compliance with prescribed medications will improve Ability to identify triggers associated with substance abuse/mental health issues will improve Ability to identify changes in lifestyle to reduce recurrence of condition will improve Ability to verbalize feelings will improve Ability to disclose and discuss suicidal ideas Ability to demonstrate self-control will improve Ability to identify and develop effective coping behaviors will improve Ability to maintain clinical measurements within normal limits will improve Compliance with  prescribed medications will improve Ability to identify triggers associated with substance abuse/mental health issues will improve  Medication Management: Evaluate patient's response, side effects, and tolerance of medication regimen.  Therapeutic Interventions: 1 to 1 sessions, Unit Group sessions and Medication administration.  Evaluation of Outcomes: Progressing  Physician Treatment Plan for Secondary Diagnosis: Principal Problem:   MDD (major depressive disorder) Active Problems:   Alcohol abuse  Long Term Goal(s): Improvement in symptoms so as ready for discharge Improvement in symptoms so as ready for discharge   Short Term Goals: Ability to identify changes in lifestyle to reduce recurrence of condition will improve Ability to verbalize feelings will improve Ability to disclose and discuss suicidal ideas Ability to demonstrate self-control will improve Ability to identify and develop effective coping behaviors will improve Ability to maintain clinical measurements within normal limits will improve Compliance with prescribed medications will improve Ability to identify triggers associated with substance abuse/mental health issues will improve Ability to identify changes in lifestyle to reduce recurrence of condition will improve Ability to verbalize feelings will improve Ability to disclose and discuss suicidal ideas Ability to demonstrate self-control will improve Ability to identify and develop effective coping behaviors will improve Ability to maintain clinical measurements within normal limits will improve Compliance with prescribed medications will improve Ability to identify triggers associated with substance abuse/mental health issues will improve     Medication Management: Evaluate patient's response, side effects, and tolerance of medication regimen.  Therapeutic Interventions: 1 to 1 sessions, Unit Group sessions and Medication administration.  Evaluation of  Outcomes: Progressing   RN Treatment Plan for Primary Diagnosis: MDD (major depressive disorder) Long Term Goal(s): Knowledge of disease and therapeutic regimen to maintain health will improve  Short Term Goals: Ability to remain free from injury will improve, Ability to disclose and discuss suicidal ideas and Ability to identify and develop effective coping behaviors will improve  Medication Management: RN will administer medications as ordered by provider, will assess and evaluate patient's response and provide education to patient for prescribed medication. RN will report any adverse and/or side effects to prescribing provider.  Therapeutic Interventions: 1 on 1 counseling sessions, Psychoeducation, Medication administration, Evaluate responses to treatment, Monitor vital signs and CBGs as ordered, Perform/monitor CIWA, COWS, AIMS and Fall Risk screenings as ordered, Perform wound care treatments as ordered.  Evaluation of Outcomes: Progressing   LCSW Treatment Plan for Primary Diagnosis: MDD (major depressive disorder) Long Term Goal(s): Safe transition to appropriate next level of care at discharge, Engage patient in therapeutic group addressing interpersonal concerns.  Short Term Goals: Engage patient in aftercare planning with referrals and resources, Facilitate patient progression through stages of change regarding substance use diagnoses and concerns and Identify triggers associated with mental health/substance abuse issues  Therapeutic Interventions: Assess for all discharge needs, 1 to 1 time with Social worker, Explore available resources and support systems, Assess for adequacy in community support network,  Educate family and significant other(s) on suicide prevention, Complete Psychosocial Assessment, Interpersonal group therapy.  Evaluation of Outcomes: Progressing   Progress in Treatment: Attending groups: Yes. Participating in groups: Yes. Taking medication as prescribed:  Yes. Toleration medication: Yes. Family/Significant other contact made: SPE completed with pt; pt declined to consent to collateral contact.  Patient understands diagnosis: Yes. Discussing patient identified problems/goals with staff: Yes. Medical problems stabilized or resolved: Yes. Denies suicidal/homicidal ideation: Yes. Issues/concerns per patient self-inventory: No.  Other: n/a   New problem(s) identified: No, Describe:  n/a  New Short Term/Long Term Goal(s): detox, medication management for mood stabilization; elimination of SI thoughts; development of comprehensive mental wellness/sobriety plan.   Patient Goal: "to follow-up at [facility] when I leave here and get my medications right."   Discharge Plan or Barriers: CSW assessing for appropriate referrals. Pt requesting [facility] appt and information to "other programs." Pt states that he does not want to go directly to treatment from here. [facility] pamphlet and AA/NA list provided for additional community support.   Reason for Continuation of Hospitalization: Anxiety Depression Medication stabilization Suicidal ideation  Estimated Length of Stay: Friday, 04/05/17  Attendees: Patient: Rodney Watson 04/03/2017 9:25 AM  Physician: Dr. Jackquline Berlin MD; Dr. Altamese Marshall MD 04/03/2017 9:25 AM  Nursing: Armando Reichert RN; [place] RN 04/03/2017 9:25 AM  RN Care Manager: Juliann Pares 04/03/2017 9:25 AM  Social Worker: [employer], LCSW 04/03/2017 9:25 AM  Recreational Therapist: x 04/03/2017 9:25 AM  Other: Armandina Stammer NP; Feliz Beam Money NP 04/03/2017 9:25 AM  Other:  04/03/2017 9:25 AM  Other: 04/03/2017 9:25 AM    Scribe for Treatment Team: Ledell Peoples Smart, LCSW 04/03/2017 9:25 AM

## 2017-04-03 NOTE — Progress Notes (Signed)
Recreation Therapy Notes  Date: 04/03/17 Time: 0930 Location: [address] Dayroom  Group Topic: Stress Management  Goal Area(s) Addresses:  Patient will verbalize importance of using healthy stress management.  Patient will identify positive emotions associated with healthy stress management.   Intervention: Stress Management  Activity :  LRT introduced the stress management technique of guided imagery.  LRT read a script to guide patients to envision their peaceful place. Patients were to follow along as LRT read script.  Education:  Stress Management, Discharge Planning.   Education Outcome: Acknowledges edcuation/In group clarification offered/Needs additional education  Clinical Observations/Feedback: Pt did not attend group.    Caroll RancherMarjette Sahvannah Rieser, LRT/CTRS         Caroll RancherLindsay, Aleaya Latona A 04/03/2017 11:33 AM

## 2017-04-03 NOTE — [facility] Group Notes (Signed)
LCSW Group Therapy Note   04/03/2017 1:15pm   Type of Therapy and Topic:  Group Therapy:  Overcoming Obstacles   Participation Level:  Did Not Attend--pt invited. Chose to remain in bed.    Description of Group:    In this group patients will be encouraged to explore what they see as obstacles to their own wellness and recovery. They will be guided to discuss their thoughts, feelings, and behaviors related to these obstacles. The group will process together ways to cope with barriers, with attention given to specific choices patients can make. Each patient will be challenged to identify changes they are motivated to make in order to overcome their obstacles. This group will be process-oriented, with patients participating in exploration of their own experiences as well as giving and receiving support and challenge from other group members.   Therapeutic Goals: 1. Patient will identify personal and current obstacles as they relate to admission. 2. Patient will identify barriers that currently interfere with their wellness or overcoming obstacles.  3. Patient will identify feelings, thought process and behaviors related to these barriers. 4. Patient will identify two changes they are willing to make to overcome these obstacles:      Summary of Patient Progress   x   Therapeutic Modalities:   Cognitive Behavioral Therapy Solution Focused Therapy Motivational Interviewing Relapse Prevention Therapy  Ledell PeoplesHeather N Smart, LCSW 04/03/2017 12:39 PM

## 2017-04-03 NOTE — Progress Notes (Signed)
Patient did not attend the evening speaker NA meeting. Pt was notified that group was beginning but returned to his room.   

## 2017-04-04 DIAGNOSIS — F332 Major depressive disorder, recurrent severe without psychotic features: Secondary | ICD-10-CM

## 2017-04-04 DIAGNOSIS — G4700 Insomnia, unspecified: Secondary | ICD-10-CM

## 2017-04-04 DIAGNOSIS — F419 Anxiety disorder, unspecified: Secondary | ICD-10-CM

## 2017-04-04 DIAGNOSIS — F1290 Cannabis use, unspecified, uncomplicated: Secondary | ICD-10-CM

## 2017-04-04 DIAGNOSIS — R450 Nervousness: Secondary | ICD-10-CM

## 2017-04-04 DIAGNOSIS — F1910 Other psychoactive substance abuse, uncomplicated: Secondary | ICD-10-CM

## 2017-04-04 MED ORDER — HYDROXYZINE HCL 25 MG PO TABS
25.0000 mg | ORAL_TABLET | Freq: Four times a day (QID) | ORAL | Status: DC | PRN
Start: 2017-04-04 — End: 2017-04-06
  Filled 2017-04-04: qty 1

## 2017-04-04 NOTE — Progress Notes (Signed)
[facility] MD Progress Note  04/04/2017 11:41 AM Rodney Watson  MRN:  [id number]   Subjective:  Patient states that he is doing well today. He denies any withdrawal symptoms and denies any depression. He has some minor anxiety. Patient denies any SI/HI/AVH and contracts for safety. He reports sleeping good and having a good appetite. He is still refusing residential treatment and just wants to go to outpatient services and groups like AA.  Objective: Patient's chart and findings reviewed and discussed with treatment team. Patient presents in his room lying down. He is pleasant and cooperative. Will continue current medication regimen and detox. Will discharge in 1-2 more days. He is homeless and wants to discharge back to the street.    Principal Problem: MDD (major depressive disorder), severe (HCC) Diagnosis:   Patient Active Problem List   Diagnosis Date Noted  . Severe episode of recurrent major depressive disorder, without psychotic features (HCC) [F33.2]   . MDD (major depressive disorder), severe (HCC) [F32.2] 04/02/2017  . Alcohol abuse [F10.10] 04/01/2017   Total Time spent with patient: 15 minutes  Past Psychiatric History: See H&P  Past Medical History:  Past Medical History:  Diagnosis Date  . COPD (chronic obstructive pulmonary disease) (HCC)   . Depression   . ETOH abuse   . Seizures (HCC)    History reviewed. No pertinent surgical history. Family History: History reviewed. No pertinent family history. Family Psychiatric  History: See H&P Social History:  Social History   Substance and Sexual Activity  Alcohol Use Yes   Comment: 5th daily     Social History   Substance and Sexual Activity  Drug Use Yes  . Types: Marijuana   Comment: 1-2 joints/week    Social History   Socioeconomic History  . Marital status: Single    Spouse name: None  . Number of children: None  . Years of education: None  . Highest education level: None  Social Needs  . Financial  resource strain: None  . Food insecurity - worry: None  . Food insecurity - inability: None  . Transportation needs - medical: None  . Transportation needs - non-medical: None  Occupational History  . None  Tobacco Use  . Smoking status: Current Every Day Smoker    Packs/day: 1.50    Years: 39.00    Pack years: 58.50    Types: Cigarettes  . Smokeless tobacco: Current User    Types: Chew  Substance and Sexual Activity  . Alcohol use: Yes    Comment: 5th daily  . Drug use: Yes    Types: Marijuana    Comment: 1-2 joints/week  . Sexual activity: No  Other Topics Concern  . None  Social History Narrative  . None   Additional Social History:    History of alcohol / drug use?: Yes Longest period of sobriety (when/how long): (unknown) Negative Consequences of Use: Financial, Personal relationships Withdrawal Symptoms: Seizures, Tremors, Sweats, Nausea / Vomiting Onset of Seizures: unknown Date of most recent seizure: 2 weeks ago   Name of Substance 2: marijuana 2 - Age of First Use: 12 2 - Amount (size/oz): 1-2 joints 2 - Frequency: monthly 2 - Duration: Ongoing 2 - Last Use / Amount: last month                Sleep: Good  Appetite:  Good  Current Medications: Current Facility-Administered Medications  Medication Dose Route Frequency Provider Last Rate Last Dose  . alum & mag hydroxide-simeth (MAALOX/MYLANTA) 200-200-20 MG/5ML  suspension 30 mL  30 mL Oral Q4H PRN Oneta Rack, NP      . FLUoxetine (PROZAC) capsule 10 mg  10 mg Oral Daily Izediuno, Delight Ovens, MD   10 mg at 04/04/17 0818  . hydrOXYzine (ATARAX/VISTARIL) tablet 25 mg  25 mg Oral Q6H PRN Oneta Rack, NP   25 mg at 04/03/17 2110  . ibuprofen (ADVIL,MOTRIN) tablet 400 mg  400 mg Oral Q4H PRN Oneta Rack, NP      . loperamide (IMODIUM) capsule 2-4 mg  2-4 mg Oral PRN Oneta Rack, NP      . LORazepam (ATIVAN) tablet 1 mg  1 mg Oral Q6H PRN Oneta Rack, NP      . LORazepam (ATIVAN)  tablet 1 mg  1 mg Oral BID Oneta Rack, NP   1 mg at 04/04/17 0818   Followed by  . [START ON 04/05/2017] LORazepam (ATIVAN) tablet 1 mg  1 mg Oral Daily Lewis, Tanika N, NP      . magnesium hydroxide (MILK OF MAGNESIA) suspension 30 mL  30 mL Oral Daily PRN Oneta Rack, NP      . mirtazapine (REMERON) tablet 15 mg  15 mg Oral QHS Izediuno, Delight Ovens, MD   15 mg at 04/03/17 2110  . multivitamin with minerals tablet 1 tablet  1 tablet Oral Daily Oneta Rack, NP   1 tablet at 04/04/17 0981  . nicotine polacrilex (NICORETTE) gum 2 mg  2 mg Oral PRN Georgiann Cocker, MD   2 mg at 04/03/17 2151  . ondansetron (ZOFRAN-ODT) disintegrating tablet 4 mg  4 mg Oral Q6H PRN Oneta Rack, NP      . phenytoin (DILANTIN) ER capsule 100 mg  100 mg Oral TID Georgiann Cocker, MD   100 mg at 04/04/17 0817  . thiamine (VITAMIN B-1) tablet 100 mg  100 mg Oral Daily Oneta Rack, NP   100 mg at 04/04/17 0817  . traZODone (DESYREL) tablet 50 mg  50 mg Oral QHS PRN Oneta Rack, NP   50 mg at 04/01/17 2109    Lab Results:  No results found for this or any previous visit (from the past 48 hour(s)).  Blood Alcohol level:  Lab Results  Component Value Date   ETH <10 03/31/2017   ETH 339 (HH) 03/30/2017    Metabolic Disorder Labs: No results found for: HGBA1C, MPG No results found for: PROLACTIN No results found for: CHOL, TRIG, HDL, CHOLHDL, VLDL, LDLCALC  Physical Findings: AIMS: Facial and Oral Movements Muscles of Facial Expression: None, normal Lips and Perioral Area: None, normal Jaw: None, normal Tongue: None, normal,Extremity Movements Upper (arms, wrists, hands, fingers): None, normal Lower (legs, knees, ankles, toes): None, normal, Trunk Movements Neck, shoulders, hips: None, normal, Overall Severity Severity of abnormal movements (highest score from questions above): None, normal Incapacitation due to abnormal movements: None, normal Patient's awareness of abnormal  movements (rate only patient's report): No Awareness, Dental Status Current problems with teeth and/or dentures?: No Does patient usually wear dentures?: No  CIWA:  CIWA-Ar Total: 2 COWS:     Musculoskeletal: Strength & Muscle Tone: within normal limits Gait & Station: normal Patient leans: N/A  Psychiatric Specialty Exam: Physical Exam  Nursing note and vitals reviewed. Constitutional: He is oriented to person, place, and time. He appears well-developed and well-nourished.  Cardiovascular: Normal rate.  Respiratory: Effort normal.  Musculoskeletal: Normal range of motion.  Neurological: He is  alert and oriented to person, place, and time.  Skin: Skin is warm.    Review of Systems  Constitutional: Negative.   HENT: Negative.   Eyes: Negative.   Respiratory: Negative.   Cardiovascular: Negative.   Gastrointestinal: Negative.   Genitourinary: Negative.   Musculoskeletal: Negative.   Skin: Negative.   Neurological: Negative.   Endo/Heme/Allergies: Negative.   Psychiatric/Behavioral: Positive for substance abuse. Negative for hallucinations and suicidal ideas. The patient is nervous/anxious.     Blood pressure (!) 139/105, pulse 81, temperature 98 F (36.7 C), temperature source Oral, resp. rate 18, height 5' 5.5" (1.664 m).Body mass index is 27.04 kg/m.  General Appearance: Casual  Eye Contact:  Good  Speech:  Clear and Coherent and Normal Rate  Volume:  Normal  Mood:  Euthymic  Affect:  Congruent  Thought Process:  Goal Directed and Descriptions of Associations: Intact  Orientation:  Full (Time, Place, and Person)  Thought Content:  WDL  Suicidal Thoughts:  No  Homicidal Thoughts:  No  Memory:  Immediate;   Good Recent;   Good Remote;   Good  Judgement:  Good  Insight:  Good  Psychomotor Activity:  Normal  Concentration:  Concentration: Good and Attention Span: Good  Recall:  Good  Fund of Knowledge:  Good  Language:  Good  Akathisia:  No  Handed:  Right   AIMS (if indicated):     Assets:  Communication Skills Desire for Improvement Financial Resources/Insurance Physical Health Social Support Transportation  ADL's:  Intact  Cognition:  WNL  Sleep:  Number of Hours: 4   Problems Addressed: MDD severe Alcohol abuse  Treatment Plan Summary: Daily contact with patient to assess and evaluate symptoms and progress in treatment, Medication management and Plan is to:  -Continue Prozac 10 mg PO Daily for mood control -Continue Ativan Detox Protocol -Continue Remeron 15 mg PO QHS for mood stability -Continue Trazodone 50 mg PO QHS PRN for insomnia -Continue Vistaril 25 mg PO Q6H PRN for anxiety -Encourage group therapy participation  Maryfrances Bunnellravis B Meila Berke, FNP 04/04/2017, 11:41 AM

## 2017-04-04 NOTE — [facility] Group Notes (Signed)
LCSW Group Therapy Note  04/04/2017 1:15pm  Type of Therapy/Topic:  Group Therapy:  Feelings about Diagnosis  Participation Level:  Active   Description of Group:   This group will allow patients to explore their thoughts and feelings about diagnoses they have received. Patients will be guided to explore their level of understanding and acceptance of these diagnoses. Facilitator will encourage patients to process their thoughts and feelings about the reactions of others to their diagnosis and will guide patients in identifying ways to discuss their diagnosis with significant others in their lives. This group will be process-oriented, with patients participating in exploration of their own experiences, giving and receiving support, and processing challenge from other group members.   Therapeutic Goals: 1. Patient will demonstrate understanding of diagnosis as evidenced by identifying two or more symptoms of the disorder 2. Patient will be able to express two feelings regarding the diagnosis 3. Patient will demonstrate their ability to communicate their needs through discussion and/or role play  Summary of Patient Progress:  Rodney Watson was attentive and engaged during today's processing group. He shared that he thinks there is still a stigma attached to mental illness "but I"m ready to get some help for myself." Rodney Watson continues to show progress in the group setting with improving insight.      Therapeutic Modalities:   Cognitive Behavioral Therapy Brief Therapy Feelings Identification    Ledell PeoplesHeather N Smart, LCSW 04/04/2017 12:46 PM

## 2017-04-04 NOTE — Progress Notes (Signed)
Patient ID: Rodney Watson, male   DOB: [birth year], 52 y.o.   MRN: [id number]   DAR: Pt. Denies SI/HI and A/V Hallucinations. He reports that his sleep last night was fair, his appetite is fair, his energy level is low, and his concentration is good. He rates his depression level 6/10, his hopelessness level 6/10, and his anxiety level is 8/10. He reports agitation and tremors as part of his withdrawal symptoms although he does not appear tremulous. Support and encouragement provided to the patient. Scheduled medications administered to patient per physician's orders. Patient is initially irritable and guarded however as the day progresses he is more polite and cooperative. He is seen in the milieu intermittently. Q15 minute checks are maintained for safety.

## 2017-04-04 NOTE — Progress Notes (Signed)
Pt did attend the evening wrap up group and was attentive and supportive. Pt was a little resistant and irritable asking if he had the right to remain silent and not share during group. Pt did share one positive thing about his day is that he got through it but wanted to skip what he was grateful for. Pt participated in sharing three loves for Valentine's day. Pt loves to eat shrimp, loves to walk, and loves to hear quiet.

## 2017-04-05 MED ORDER — GABAPENTIN 300 MG PO CAPS
300.0000 mg | ORAL_CAPSULE | Freq: Three times a day (TID) | ORAL | Status: DC
  Administered 2017-04-05 – 2017-04-06 (×3): 300 mg via ORAL
  Filled 2017-04-05 (×8): qty 1

## 2017-04-05 MED ORDER — TRIAMCINOLONE ACETONIDE 0.1 % EX CREA
TOPICAL_CREAM | Freq: Three times a day (TID) | CUTANEOUS | Status: DC
  Administered 2017-04-06 (×2): via TOPICAL
  Filled 2017-04-05 (×2): qty 15

## 2017-04-05 MED ORDER — GABAPENTIN 100 MG PO CAPS
100.0000 mg | ORAL_CAPSULE | Freq: Three times a day (TID) | ORAL | Status: DC
  Administered 2017-04-05: 100 mg via ORAL
  Filled 2017-04-05 (×4): qty 1

## 2017-04-05 NOTE — Progress Notes (Signed)
[facility] MD Progress Note  04/05/2017 11:51 AM Rodney Watson  MRN:  [id number]   Subjective:  Patient reports that Rodney Watson slept good last night. Rodney Watson is feeling agitated and has some minor withdrawals. Rodney Watson denies any SI/HI/AVH and contracts for safety. Rodney Watson still wants to be discharged to outpatient services and go to [employer]    Objective: Patient's chart and findings reviewed and discussed with treatment team. Rodney Watson presents in his bed sleeping. Rodney Watson has a flat affect and appears irritated, but is cooperative. Today was the last dose of the Ativan detox and Rodney Watson will be monitored for at least another day. Will also start him on Gabapentin 100 mg TID for agitation and withdrawal symptoms.     Principal Problem: MDD (major depressive disorder), severe (HCC) Diagnosis:   Patient Active Problem List   Diagnosis Date Noted  . Severe episode of recurrent major depressive disorder, without psychotic features (HCC) [F33.2]   . MDD (major depressive disorder), severe (HCC) [F32.2] 04/02/2017  . Alcohol abuse [F10.10] 04/01/2017   Total Time spent with patient: 25 minutes  Past Psychiatric History: See H&P  Past Medical History:  Past Medical History:  Diagnosis Date  . COPD (chronic obstructive pulmonary disease) (HCC)   . Depression   . ETOH abuse   . Seizures (HCC)    History reviewed. No pertinent surgical history. Family History: History reviewed. No pertinent family history. Family Psychiatric  History: See H&P Social History:  Social History   Substance and Sexual Activity  Alcohol Use Yes   Comment: 5th daily     Social History   Substance and Sexual Activity  Drug Use Yes  . Types: Marijuana   Comment: 1-2 joints/week    Social History   Socioeconomic History  . Marital status: Single    Spouse name: None  . Number of children: None  . Years of education: None  . Highest education level: None  Social Needs  . Financial resource strain: None  . Food insecurity - worry: None  .  Food insecurity - inability: None  . Transportation needs - medical: None  . Transportation needs - non-medical: None  Occupational History  . None  Tobacco Use  . Smoking status: Current Every Day Smoker    Packs/day: 1.50    Years: 39.00    Pack years: 58.50    Types: Cigarettes  . Smokeless tobacco: Current User    Types: Chew  Substance and Sexual Activity  . Alcohol use: Yes    Comment: 5th daily  . Drug use: Yes    Types: Marijuana    Comment: 1-2 joints/week  . Sexual activity: No  Other Topics Concern  . None  Social History Narrative  . None   Additional Social History:    History of alcohol / drug use?: Yes Longest period of sobriety (when/how long): (unknown) Negative Consequences of Use: Financial, Personal relationships Withdrawal Symptoms: Seizures, Tremors, Sweats, Nausea / Vomiting Onset of Seizures: unknown Date of most recent seizure: 2 weeks ago   Name of Substance 2: marijuana 2 - Age of First Use: 12 2 - Amount (size/oz): 1-2 joints 2 - Frequency: monthly 2 - Duration: Ongoing 2 - Last Use / Amount: last month                Sleep: Good  Appetite:  Good  Current Medications: Current Facility-Administered Medications  Medication Dose Route Frequency Provider Last Rate Last Dose  . alum & mag hydroxide-simeth (MAALOX/MYLANTA) 200-200-20 MG/5ML suspension  30 mL  30 mL Oral Q4H PRN Oneta RackLewis, Tanika N, NP      . FLUoxetine (PROZAC) capsule 10 mg  10 mg Oral Daily Izediuno, Delight OvensVincent A, MD   10 mg at 04/05/17 0823  . gabapentin (NEURONTIN) capsule 100 mg  100 mg Oral TID Saphire Barnhart, Gerlene Burdockravis B, FNP      . hydrOXYzine (ATARAX/VISTARIL) tablet 25 mg  25 mg Oral Q6H PRN Nira ConnBerry, Jason A, NP      . ibuprofen (ADVIL,MOTRIN) tablet 400 mg  400 mg Oral Q4H PRN Oneta RackLewis, Tanika N, NP      . magnesium hydroxide (MILK OF MAGNESIA) suspension 30 mL  30 mL Oral Daily PRN Oneta RackLewis, Tanika N, NP      . mirtazapine (REMERON) tablet 15 mg  15 mg Oral QHS Izediuno, Delight OvensVincent A,  MD   15 mg at 04/04/17 2100  . multivitamin with minerals tablet 1 tablet  1 tablet Oral Daily Oneta RackLewis, Tanika N, NP   1 tablet at 04/05/17 0824  . nicotine polacrilex (NICORETTE) gum 2 mg  2 mg Oral PRN Georgiann CockerIzediuno, Vincent A, MD   2 mg at 04/03/17 2151  . phenytoin (DILANTIN) ER capsule 100 mg  100 mg Oral TID Georgiann CockerIzediuno, Vincent A, MD   100 mg at 04/05/17 0823  . thiamine (VITAMIN B-1) tablet 100 mg  100 mg Oral Daily Oneta RackLewis, Tanika N, NP   100 mg at 04/05/17 [id number]  . traZODone (DESYREL) tablet 50 mg  50 mg Oral QHS PRN Oneta RackLewis, Tanika N, NP   50 mg at 04/04/17 2100    Lab Results:  No results found for this or any previous visit (from the past 48 hour(s)).  Blood Alcohol level:  Lab Results  Component Value Date   ETH <10 03/31/2017   ETH 339 (HH) 03/30/2017    Metabolic Disorder Labs: No results found for: HGBA1C, MPG No results found for: PROLACTIN No results found for: CHOL, TRIG, HDL, CHOLHDL, VLDL, LDLCALC  Physical Findings: AIMS: Facial and Oral Movements Muscles of Facial Expression: None, normal Lips and Perioral Area: None, normal Jaw: None, normal Tongue: None, normal,Extremity Movements Upper (arms, wrists, hands, fingers): None, normal Lower (legs, knees, ankles, toes): None, normal, Trunk Movements Neck, shoulders, hips: None, normal, Overall Severity Severity of abnormal movements (highest score from questions above): None, normal Incapacitation due to abnormal movements: None, normal Patient's awareness of abnormal movements (rate only patient's report): No Awareness, Dental Status Current problems with teeth and/or dentures?: No Does patient usually wear dentures?: No  CIWA:  CIWA-Ar Total: 1 COWS:     Musculoskeletal: Strength & Muscle Tone: within normal limits Gait & Station: normal Patient leans: N/A  Psychiatric Specialty Exam: Physical Exam  Nursing note and vitals reviewed. Constitutional: Rodney Watson is oriented to person, place, and time. Rodney Watson appears  well-developed and well-nourished.  Cardiovascular: Normal rate.  Respiratory: Effort normal.  Musculoskeletal: Normal range of motion.  Neurological: Rodney Watson is alert and oriented to person, place, and time.  Skin: Skin is warm.    Review of Systems  Constitutional: Negative.   HENT: Negative.   Eyes: Negative.   Respiratory: Negative.   Cardiovascular: Negative.   Gastrointestinal: Negative.   Genitourinary: Negative.   Musculoskeletal: Negative.   Skin: Negative.   Neurological: Negative.   Endo/Heme/Allergies: Negative.   Psychiatric/Behavioral: Positive for substance abuse. Negative for hallucinations and suicidal ideas. The patient is nervous/anxious.     Blood pressure (!) 123/93, pulse 83, temperature 98.3 F (36.8 C), resp. rate 16,  height 5' 5.5" (1.664 m).Body mass index is 27.04 kg/m.  General Appearance: Casual  Eye Contact:  Good  Speech:  Clear and Coherent and Normal Rate  Volume:  Normal  Mood:  Irritable  Affect:  Flat  Thought Process:  Goal Directed and Descriptions of Associations: Intact  Orientation:  Full (Time, Place, and Person)  Thought Content:  WDL  Suicidal Thoughts:  No  Homicidal Thoughts:  No  Memory:  Immediate;   Good Recent;   Good Remote;   Good  Judgement:  Good  Insight:  Good  Psychomotor Activity:  Normal  Concentration:  Concentration: Good and Attention Span: Good  Recall:  Good  Fund of Knowledge:  Good  Language:  Good  Akathisia:  No  Handed:  Right  AIMS (if indicated):     Assets:  Communication Skills Desire for Improvement Financial Resources/Insurance Physical Health Social Support Transportation  ADL's:  Intact  Cognition:  WNL  Sleep:  Number of Hours: 4.5   Problems Addressed: MDD severe Alcohol abuse  Treatment Plan Summary: Daily contact with patient to assess and evaluate symptoms and progress in treatment, Medication management and Plan is to:  -Continue Prozac 10 mg PO Daily for mood control -Start  Gabapentin 100 mg PO TID PRN for agitation and withdrawal symptoms -Continue Remeron 15 mg PO QHS for mood stability -Continue Trazodone 50 mg PO QHS PRN for insomnia -Continue Vistaril 25 mg PO Q6H PRN for anxiety -Encourage group therapy participation  Maryfrances Bunnell, FNP 04/05/2017, 11:51 AM

## 2017-04-05 NOTE — Progress Notes (Signed)
Data. Patient denies SI/HI/AVH. Verbally contracts for safety on the unit and to come to staff before acting of any self harm thoughts/feelings.  Patient  Woke up irritable this morning, and reported poor sleep. He requested a change in his sleep medication. Patients affect is flat and irritable and does not brighten with interaction. Patient slept late and missed morning orientation group. Patient did not complete is self assessment this shift. Action. Emotional support and encouragement offered. Education provided on medication, indications and side effect. Q 15 minute checks done for safety. Response. Safety on the unit maintained through 15 minute checks.  Medications taken as prescribed. Attended groups.

## 2017-04-05 NOTE — Plan of Care (Signed)
Patient has been appropriate in his communication of his feelings this shift. Patient has had no injuries this shift, either self inflicted, or otherwise. Patient has been attending most unit activities this shift.

## 2017-04-05 NOTE — Progress Notes (Signed)
Psychoeducational Group Note  Date:  04/05/2017 Time:1000  Group Topic/Focus:  Recovery Goals:   The focus of this group is to identify appropriate goals for recovery and establish a plan to achieve them.  Participation Level: Did Not Attend  Participation Quality:  Not Applicable  Affect:  Not Applicable  Cognitive:  Not Applicable  Insight:  Not Applicable  Engagement in Group: Not Applicable  Additional Comments:    Rodney Watson, Rodney Watson Patience 04/05/2017, 3:17 PM

## 2017-04-05 NOTE — [facility] Group Notes (Signed)
LCSW Group Therapy Note  04/05/2017 1:15pm  Type of Therapy and Topic:  Group Therapy:  Feelings around Relapse and Recovery  Participation Level:  Active   Description of Group:    Patients in this group will discuss emotions they experience before and after a relapse. They will process how experiencing these feelings, or avoidance of experiencing them, relates to having a relapse. Facilitator will guide patients to explore emotions they have related to recovery. Patients will be encouraged to process which emotions are more powerful. They will be guided to discuss the emotional reaction significant others in their lives may have to their relapse or recovery. Patients will be assisted in exploring ways to respond to the emotions of others without this contributing to a relapse.  Therapeutic Goals: 1. Patient will identify two or more emotions that lead to a relapse for them 2. Patient will identify two emotions that result when they relapse 3. Patient will identify two emotions related to recovery 4. Patient will demonstrate ability to communicate their needs through discussion and/or role plays   Summary of Patient Progress:  Rodney Watson was attentive and engaged during today's processing group. He shared that through his last relapse, he learned that he needs to "Stay away from alcohol [facility]." Rodney Watson continues to show progress in the group setting with improving insight.    Therapeutic Modalities:   Cognitive Behavioral Therapy Solution-Focused Therapy Assertiveness Training Relapse Prevention Therapy   Ledell PeoplesHeather N Smart, LCSW 04/05/2017 11:32 AM

## 2017-04-05 NOTE — Progress Notes (Signed)
D.  Pt pleasant but anxious on approach, complaint of circular reddened area to his left palm.  Pt states area has been itching and that he has had it form before and become a blister.  Pt was positive for evening AA group, observed engaged in appropriate but minimal interaction with peers on the unit.  Pt denies SI/HI/AVH at this time.  A.  Support and encouragement offered, medication given as ordered.  Spoke to NP SardisJason who then went to see Pt.  New orders received.  R.  Pt remains safe on the unit, will continue to monitor.

## 2017-04-05 NOTE — Progress Notes (Signed)
Recreation Therapy Notes  Date: 04/05/17 Time: 0930 Location: 300 [address] Dayroom  Group Topic: Stress Management  Goal Area(s) Addresses:  Patient will verbalize importance of using healthy stress management.  Patient will identify positive emotions associated with healthy stress management.   Intervention: Stress Management  Activity :  Body Scan Meditation.  LRT introduced the stress management technique of meditation.  LRT played a meditation from the Calm app that allowed patients to take inventory of any sensations or tension they may have been experiencing.  Education:  Stress Management, Discharge Planning.   Education Outcome: Acknowledges edcuation/In group clarification offered/Needs additional education  Clinical Observations/Feedback: Pt did not attend group.    Caroll RancherMarjette Kaysha Parsell, LRT/CTRS         Caroll RancherLindsay, Valeta Paz A 04/05/2017 12:53 PM

## 2017-04-05 NOTE — [facility] Group Notes (Signed)
[facility] Group Notes:  (Nursing/MHT/Case Management/Adjunct)  Date:  04/05/2017  Time:  6:40 PM  Type of Therapy:  Nurse Education  Participation Level:  Minimal  Participation Quality:  Appropriate  Affect:  Appropriate  Cognitive:  Appropriate  Insight:  Appropriate  Engagement in Group:  Engaged  Modes of Intervention:  Discussion and Education  Summary of Progress/Problems: Nurse discussed the SMART goal setting guide for coping skills and relapse prevention. Nurse also discussed the Relapse Prevention Plan Worksheet and encouraged all patients to fill the worksheet out, individually, thoughtfully and with the goal of staying heathy.   Almira Barenny G Kimbree Casanas 04/05/2017, 6:40 PM

## 2017-04-06 DIAGNOSIS — Z560 Unemployment, unspecified: Secondary | ICD-10-CM

## 2017-04-06 DIAGNOSIS — F39 Unspecified mood [affective] disorder: Secondary | ICD-10-CM

## 2017-04-06 DIAGNOSIS — F1010 Alcohol abuse, uncomplicated: Secondary | ICD-10-CM

## 2017-04-06 DIAGNOSIS — F331 Major depressive disorder, recurrent, moderate: Secondary | ICD-10-CM

## 2017-04-06 DIAGNOSIS — Y908 Blood alcohol level of 240 mg/100 ml or more: Secondary | ICD-10-CM

## 2017-04-06 DIAGNOSIS — F322 Major depressive disorder, single episode, severe without psychotic features: Secondary | ICD-10-CM

## 2017-04-06 DIAGNOSIS — R451 Restlessness and agitation: Secondary | ICD-10-CM

## 2017-04-06 MED ORDER — GABAPENTIN 300 MG PO CAPS: 300.0000 mg | ORAL_CAPSULE | Freq: Three times a day (TID) | ORAL | 0 refills | Status: DC

## 2017-04-06 MED ORDER — MIRTAZAPINE 15 MG PO TABS: 15.0000 mg | ORAL_TABLET | Freq: Every day | ORAL | 0 refills | Status: DC

## 2017-04-06 MED ORDER — TRAZODONE HCL 50 MG PO TABS: 50.0000 mg | ORAL_TABLET | Freq: Every evening | ORAL | 0 refills | Status: DC | PRN

## 2017-04-06 MED ORDER — FLUOXETINE HCL 10 MG PO CAPS: 10.0000 mg | ORAL_CAPSULE | Freq: Every day | ORAL | 0 refills | Status: DC

## 2017-04-06 MED ORDER — PHENYTOIN SODIUM EXTENDED 100 MG PO CAPS: 100.0000 mg | ORAL_CAPSULE | Freq: Three times a day (TID) | ORAL | 0 refills | Status: DC

## 2017-04-06 MED ORDER — TRIAMCINOLONE ACETONIDE 0.1 % EX CREA: TOPICAL_CREAM | Freq: Three times a day (TID) | CUTANEOUS | 0 refills | Status: DC

## 2017-04-06 NOTE — B[facility] Suicide Risk Assessment (Signed)
[facility] Discharge Suicide Risk Assessment   Principal Problem: MDD (major depressive disorder), severe [facility]) Discharge Diagnoses:  Patient Active Problem List   Diagnosis Date Noted  . Severe episode of recurrent major depressive disorder, without psychotic features (HCC) [F33.2]   . MDD (major depressive disorder), severe (HCC) [F32.2] 04/02/2017  . Alcohol abuse [F10.10] 04/01/2017    Total Time spent with patient: 45 minutes  Musculoskeletal: Strength & Muscle Tone: within normal limits Gait & Station: normal Patient leans: N/A  Psychiatric Specialty Exam: Review of Systems  Constitutional: Negative.   HENT: Negative.   Eyes: Negative.   Respiratory: Negative.   Cardiovascular: Negative.   Gastrointestinal: Negative.   Genitourinary: Negative.   Musculoskeletal: Negative.   Skin: Negative.   Neurological: Negative.   Endo/Heme/Allergies: Negative.   Psychiatric/Behavioral: Negative for depression, hallucinations, memory loss, substance abuse and suicidal ideas. The patient is not nervous/anxious and does not have insomnia.     Blood pressure (!) 121/94, pulse 83, temperature 98.1 F (36.7 C), temperature source Oral, resp. rate 18, height 5' 5.5" (1.664 m).Body mass index is 27.04 kg/m.  General Appearance: Neatly dressed, pleasant, engaging well and cooperative. Appropriate behavior. Not in any distress. Good relatedness. Not internally stimulated.  Eye Contact::  Good  Speech:  Spontaneous, normal prosody. Normal tone and rate.   Volume:  Normal  Mood:  Euthymic  Affect:  Appropriate and Full Range  Thought Process:  Linear  Orientation:  Full (Time, Place, and Person)  Thought Content:  No delusional theme. No preoccupation with violent thoughts. No negative ruminations. No obsession.  No hallucination in any modality.   Suicidal Thoughts:  No  Homicidal Thoughts:  No  Memory:  Immediate;   Good Recent;   Good Remote;   Good  Judgement:  Good  Insight:  Good   Psychomotor Activity:  Normal  Concentration:  Good  Recall:  Good  Fund of Knowledge:Good  Language: WNL  Akathisia:  Negative  Handed:    AIMS (if indicated):     Assets:  Communication Skills Desire for Improvement Financial Resources/Insurance Resilience  Sleep:  Number of Hours: 6.75  Cognition: WNL  ADL's:  Intact   Clinical Assessment::   52 y.o Caucasian male, never married, no kids, homeless since 1996. Background history of AUD, MDD and PTSD Presented to the ER via emergency services. Rodney Watson Seat of a height and landed 20 feet below. Was intoxicated with alcohol at presentation.BAL 339 mg/dl. Reported suicidal thoughts and increasing depression at the ER. Routine labs are within normal limits. Toxicology is negative. UDS is negative.   Seen today. He has completely come off alcohol. No residual withdrawal symptoms. He showed me rash over his palm. Says it has been a recurring theme for months now. 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 we reviewed literature on it's association with with alcohol together. Patient told me that he has just made up his mind to quit alcohol for good.. Reports that he is in good spirits. Not feeling depressed. Reports normal energy and interest. Has been maintaining normal biological functions. He is able to think clearly. He is able to focus on task. His thoughts are not crowded or racing. No evidence of mania. No hallucination in any modality. He is not making any delusional statement. No passivity of will/thought. He is fully in touch with reality. No thoughts of suicide. No thoughts of homicide. No violent thoughts. No overwhelming anxiety. No access to weapons.  Denies any new stressors.   Nursing  staff reports that patient has been appropriate on the unit. Patient has been interacting well with peers. No behavioral issues. Patient has not voiced any suicidal thoughts. Patient has not been observed to be internally stimulated. Patient has  been adherent with treatment recommendations. Patient has been tolerating their medication well.   Patient was discussed at team. Team members feels that patient is back to his baseline level of function. Team agrees with plan to discharge patient today.   Demographic Factors:  Male  Loss Factors: NA  Historical Factors: Impulsivity  Risk Reduction Factors:   Positive social support, Positive therapeutic relationship and Positive coping skills or problem solving skills  Continued Clinical Symptoms:  As above   Cognitive Features That Contribute To Risk:  None    Suicide Risk:  Minimal: No identifiable suicidal ideation.  Patient is not having any thoughts of suicide at this time. Modifiable risk factors targeted during this admission includes substance use. Demographical and historical risk factors cannot be modified. Patient is now engaging well. Patient is reliable and is future oriented. We have buffered patient's support structures. At this point, patient is at low risk of suicide. Patient is aware of the effects of psychoactive substances on decision making process. Patient has been provided with emergency contacts. Patient acknowledges to use resources provided if unforseen circumstances changes their current risk stratification.    Follow-up Information    [facility] Follow up on 04/10/2017.   Specialty:  Behavioral Health Why:  Follow up is scheduled for 04/10/17 at 8:15. Please bring your ID and any proof of insurance. Contact information: [address] [place] [address] [phone]           Plan Of Care/Follow-up recommendations:  1. Continue current psychotropic medications 2. Mental health and addiction follow up as arranged.  3. Provided limited quantity of prescriptions   Georgiann CockerVincent A Analeise Mccleery, MD 04/06/2017, 10:27 AM

## 2017-04-06 NOTE — Progress Notes (Signed)
  [facility] :  Will you be returning to the same living situation after discharge:  Yes,  alone in woods At discharge, do you have transportation home?: Yes,  walk Do you have the ability to pay for your medications: Yes,  disability income and Medicaid  Release of information consent forms completed and turned in to Medical Records by CSW.  Patient to Follow up at: Follow-up Information    [facility] Follow up on 04/10/2017.   Specialty:  Behavioral Health Why:  Follow up is scheduled for 04/10/17 at 8:15. Please bring your ID and any proof of insurance. Contact information: [address] [place] [address] [phone]           Next level of care provider has [facility] Link:no  Safety Planning and Suicide Prevention discussed: Yes,  with patient  Have you used any form of tobacco in the last 30 days? (Cigarettes, Smokeless Tobacco, Cigars, and/or Pipes): Yes  Has patient been referred to the Quitline?: Patient refused referral  Patient has been referred for addiction treatment: Yes  Lynnell ChadMareida J Grossman-Orr, LCSW 04/06/2017, 5:14 PM

## 2017-04-06 NOTE — Discharge Summary (Signed)
Physician Discharge Summary Note  Patient:  Rodney Watson is an 52 y.o., male MRN:  [id number] DOB:  [birth year] Patient phone:  [phone] (home)  Patient address:   [place] [address],  Total Time spent with patient: 30 minutes  Date of Admission:  04/01/2017 Date of Discharge: 04/06/2017  Reason for Admission:  Depression with suicide threat  Principal Problem: MDD (major depressive disorder), severe [facility]) Discharge Diagnoses: Patient Active Problem List   Diagnosis Date Noted  . Moderate episode of recurrent major depressive disorder ([place]) [F33.1]   . Severe episode of recurrent major depressive disorder, without psychotic features ([place]) [F33.2]   . MDD (major depressive disorder), severe ([place]) [F32.2] 04/02/2017  . Alcohol abuse [F10.10] 04/01/2017    Past Psychiatric History: depression, alcohol abuse  Past Medical History:  Past Medical History:  Diagnosis Date  . COPD (chronic obstructive pulmonary disease) ([place])   . Depression   . ETOH abuse   . Seizures ([place])    History reviewed. No pertinent surgical history. Family History: History reviewed. No pertinent family history. Family Psychiatric  History: none Social History:  Social History   Substance and Sexual Activity  Alcohol Use Yes   Comment: 5th daily     Social History   Substance and Sexual Activity  Drug Use Yes  . Types: Marijuana   Comment: 1-2 joints/week    Social History   Socioeconomic History  . Marital status: Single    Spouse name: None  . Number of children: None  . Years of education: None  . Highest education level: None  Social Needs  . Financial resource strain: None  . Food insecurity - worry: None  . Food insecurity - inability: None  . Transportation needs - medical: None  . Transportation needs - non-medical: None  Occupational History  . None  Tobacco Use  . Smoking status: Current Every Day Smoker    Packs/day: 1.50    Years: 39.00    Pack years:  58.50    Types: Cigarettes  . Smokeless tobacco: Current User    Types: Chew  Substance and Sexual Activity  . Alcohol use: Yes    Comment: 5th daily  . Drug use: Yes    Types: Marijuana    Comment: 1-2 joints/week  . Sexual activity: No  Other Topics Concern  . None  Social History Narrative  . None    Hospital Course:  On admission 04/02/2017:  52 y.o Caucasian male, never married, no kids, homeless since 1996. Background history of AUD, MDD and PTSD Presented to the ER via emergency services. [place] of a height and landed 20 feet below. Was intoxicated with alcohol at presentation.BAL 339 mg/dl. Reported suicidal thoughts and increasing depression at the ER. Routine labs are within normal limits. Toxicology is negative. UDS is negative.  At interview, patient reports that he has been an alcoholic for many years. Daily pattern of drinking. Says he prefers to live in a tent in the woods. He has been feeling down off and on. Says he has been feeling like there is no point going on for many years. Says while intoxicated he tends to feel more suicidal. Patient has been off his medications for a long time. Says his fall was accidental and probably related to seizures. Says he wants to get back on his medications. Shakes are less, no sweatiness, no headaches. No retching, nausea or vomiting. No fullness in the head. No visual, tactile or auditory hallucination. No internal restlessness. No current  Suicidal. No homicidal thoughts. No thoughts of violence. He does not have access to weapons.   Medications:  Ativan alcohol detox protocol continued, started Prozac 10 mg daily for depression and Remeron 15 mg at bedtime for sleep.  04/03/2017:  Subjective:  Patient reports that he is doing okay today. He reports minimal withdrawal sym[ptoms with the use of medications. He reports sleeping well and having a good appetite. He denies any SI/HI/AVH and contracts for safety. He reports that after detox he jut  wants to go to an outpatient treatment and refuses referral to residential programs.    Objective: Patient's chart and findings reviewed and discussed with treatment team. Patient presents in his room sleeping and has remained there most of the day. He has not attended groups yet, but feel this is due to withdrawals. Will continue current medications and the CSW is providing patient with information for outpatient treatment options.   Medications:  Continue medications  04/04/2017:  Subjective:  Patient states that he is doing well today. He denies any withdrawal symptoms and denies any depression. He has some minor anxiety. Patient denies any SI/HI/AVH and contracts for safety. He reports sleeping good and having a good appetite. He is still refusing residential treatment and just wants to go to outpatient services and groups like AA.  Objective: Patient's chart and findings reviewed and discussed with treatment team. Patient presents in his room lying down. He is pleasant and cooperative. Will continue current medication regimen and detox. Will discharge in 1-2 more days. He is homeless and wants to discharge back to the street.    Medications:  No changes  04/05/2017:  Subjective:  Patient reports that he slept good last night. He is feeling agitated and has some minor withdrawals. He denies any SI/HI/AVH and contracts for safety. He still wants to be discharged to outpatient services and go to [employer].    Objective: Patient's chart and findings reviewed and discussed with treatment team. He presents in his bed sleeping. He has a flat affect and appears irritated, but is cooperative. Today was the last dose of the Ativan detox and he will be monitored for at least another day. Will also start him on Gabapentin 100 mg TID for agitation and withdrawal symptoms.     Medications:  Ativan alcohol detox complete, started gabapentin 100 mg TID for withdrawal symptoms, continue other  medications  04/06/2017:  Patient has met maximum benefit of hospitalization.  He has attended individual and group therapies.  No suicidal/homicidal ideations, hallucinations, or withdrawal symptoms.  He will [facility] with [facility] after discharge. Rx and crisis numbers provided at discharge, stable for discharge.   Physical Findings: AIMS: Facial and Oral Movements Muscles of Facial Expression: None, normal Lips and Perioral Area: None, normal Jaw: None, normal Tongue: None, normal,Extremity Movements Upper (arms, wrists, hands, fingers): None, normal Lower (legs, knees, ankles, toes): None, normal, Trunk Movements Neck, shoulders, hips: None, normal, Overall Severity Severity of abnormal movements (highest score from questions above): None, normal Incapacitation due to abnormal movements: None, normal Patient's awareness of abnormal movements (rate only patient's report): No Awareness, Dental Status Current problems with teeth and/or dentures?: No Does patient usually wear dentures?: No  CIWA:  CIWA-Ar Total: 3 COWS:     Musculoskeletal: Strength & Muscle Tone: within normal limits Gait & Station: normal Patient leans: N/A  Psychiatric Specialty Exam: Physical Exam  Constitutional: He is oriented to person, place, and time. He appears well-developed and well-nourished.  HENT:  Head:  Normocephalic.  Neck: Normal range of motion.  Respiratory: Effort normal.  Musculoskeletal: Normal range of motion.  Neurological: He is alert and oriented to person, place, and time.  Psychiatric: He has a normal mood and affect. His speech is normal and behavior is normal. Judgment and thought content normal. Cognition and memory are normal.    Review of Systems  Psychiatric/Behavioral: Positive for substance abuse.  All other systems reviewed and are negative.   Blood pressure (!) 121/94, pulse 83, temperature 98.1 F (36.7 C), temperature source Oral, resp. rate 18, height 5' 5.5" (1.664  m).Body mass index is 27.04 kg/m.  General Appearance: Casual  Eye Contact:  Good  Speech:  Normal Rate  Volume:  Normal  Mood:  Euthymic  Affect:  Congruent  Thought Process:  Coherent and Descriptions of Associations: Intact  Orientation:  Full (Time, Place, and Person)  Thought Content:  WDL and Logical  Suicidal Thoughts:  No  Homicidal Thoughts:  No  Memory:  Immediate;   Good Recent;   Good Remote;   Good  Judgement:  Fair  Insight:  Good  Psychomotor Activity:  Normal  Concentration:  Concentration: Good and Attention Span: Good  Recall:  Good  Fund of Knowledge:  Fair  Language:  Good  Akathisia:  No  Handed:  Right  AIMS (if indicated):     Assets:  Leisure Time Physical Health Resilience Social Support  ADL's:  Intact  Cognition:  WNL  Sleep:  Number of Hours: 6.75     Have you used any form of tobacco in the last 30 days? (Cigarettes, Smokeless Tobacco, Cigars, and/or Pipes): Yes  Has this patient used any form of tobacco in the last 30 days? (Cigarettes, Smokeless Tobacco, Cigars, and/or Pipes) No, N/A  Blood Alcohol level:  Lab Results  Component Value Date   ETH <10 03/31/2017   ETH 339 (HH) 42/35/3614    Metabolic Disorder Labs:  No results found for: HGBA1C, MPG No results found for: PROLACTIN No results found for: CHOL, TRIG, HDL, CHOLHDL, VLDL, LDLCALC  See Psychiatric Specialty Exam and Suicide Risk Assessment completed by Attending Physician prior to discharge.  Discharge destination:  Home  Is patient on multiple antipsychotic therapies at discharge:  No   Has Patient had three or more failed trials of antipsychotic monotherapy by history:  No  Recommended Plan for Multiple Antipsychotic Therapies: NA  Discharge Instructions    Diet - low sodium heart healthy   Complete by:  As directed    Discharge instructions   Complete by:  As directed    Follow-up with [facility] on Tuesday   Increase activity slowly   Complete by:  As  directed      Allergies as of 04/06/2017      Reactions   Chocolate Hives   Vicodin [hydrocodone-acetaminophen] Nausea And Vomiting      Medication List    TAKE these medications     Indication  FLUoxetine 10 MG capsule Commonly known as:  PROZAC Take 1 capsule (10 mg total) by mouth daily. Start taking on:  04/07/2017  Indication:  Depression   gabapentin 300 MG capsule Commonly known as:  NEURONTIN Take 1 capsule (300 mg total) by mouth 3 (three) times daily.  Indication:  Alcohol Withdrawal Syndrome   mirtazapine 15 MG tablet Commonly known as:  REMERON Take 1 tablet (15 mg total) by mouth at bedtime.  Indication:  Major Depressive Disorder   phenytoin 100 MG ER capsule Commonly known as:  DILANTIN Take 1 capsule (100 mg total) by mouth 3 (three) times daily. What changed:    medication strength  how much to take  when to take this  Indication:  Seizure   traZODone 50 MG tablet Commonly known as:  DESYREL Take 1 tablet (50 mg total) by mouth at bedtime as needed for sleep.  Indication:  Trouble Sleeping   triamcinolone cream 0.1 % Commonly known as:  KENALOG Apply topically 3 (three) times daily.  Indication:  Allergic Contact Dermatitis      Follow-up Information    [facility] Follow up on 04/10/2017.   Specialty:  Behavioral Health Why:  Follow up is scheduled for 04/10/17 at 8:15. Please bring your ID and any proof of insurance. Contact information: [address] [place] [address] [phone]           Follow-up recommendations:  Activity:  as tolerated Diet:  heart healthy diet  Comments:  Follow-up with [facility] after discharge.  SignedWaylan Boga, NP 04/06/2017, 11:43 AM

## 2017-04-06 NOTE — [facility] Group Notes (Signed)
Orientation / Goals   Date:  04/06/2017  Time:  5:16 PM  Type of Therapy:  Nurse Education  The group focuses on teaching patients who their staff is, what their staff responsibilities are and what the unit programming / scheduling looks like    Participation Level:  Active  Participation Quality:  Attentive  Affect:  Anxious  Cognitive:  Alert  Insight:  Limited  Engagement in Group:  Engaged  Modes of Intervention:  Discussion  Summary of Progress/Problems:  Rodney Watson, Rodney Watson 04/06/2017, 5:16 PM

## 2017-04-06 NOTE — Progress Notes (Signed)
Patient attended the evening A.A. meeting and was appropriate.  

## 2017-04-06 NOTE — [facility] Group Notes (Signed)
LCSW Group Therapy Note  04/06/2017 9:30-10:30AM - 300 Hall, 10:30-11:30 - 400 Hall, 11:30-12:00 - 500 Hall  Type of Therapy and Topic:  Group Therapy: Anger Cues and Responses  Participation Level:  Active   Description of Group:   In this group, patients learned how to recognize the physical, cognitive, emotional, and behavioral responses they have to anger-provoking situations.  They identified a recent time they became angry and how they reacted.  They analyzed how their reaction was possibly beneficial and how it was possibly unhelpful.  The group discussed a variety of healthier coping skills that could help with such a situation in the future.  Deep breathing was practiced briefly.  Therapeutic Goals: 1. Patients will remember their last incident of anger and how they felt emotionally and physically, what their thoughts were at the time, and how they behaved. 2. Patients will identify how their behavior at that time worked for them, as well as how it worked against them. 3. Patients will explore possible new behaviors to use in future anger situations. 4. Patients will learn that anger itself is normal and cannot be eliminated, and that healthier reactions can assist with resolving conflict rather than worsening situations.  Summary of Patient Progress:  The patient shared that their most recent time of anger was so long ago he cannot remember and later talked about how he has to stay away from people in order to not become upset.  He stated he gets angry about being homeless, then also said it is best for him to stay away from people and hang out with "raccoons."  Therapeutic Modalities:   Cognitive Behavioral Therapy  Lynnell ChadMareida J Grossman-Orr  04/06/2017 8:28 AM

## 2017-04-06 NOTE — Progress Notes (Signed)
Patient ID: Rodney Watson, male   DOB: Jun 22, 1965, 52 y.o.   MRN: [id number] Patient discharged to home/self care.  Patient left with on bus.  Patient was eager to discharged and acknowledged understanding of discharge instructions and receipt of his personal belongings.

## 2017-04-06 NOTE — [facility] Suicide Risk Assessment (Signed)
[facility] INPATIENT:  Family/Significant Other Suicide Prevention Education  Suicide Prevention Education:  Education Completed WITH PATIENT BY WEEKDAY CSW, PER HANDOFF.  Patient has been provided the following suicide prevention education, prior to the discharge of the patient.  The suicide prevention education provided includes the following:  Suicide risk factors  Suicide prevention and interventions  National Suicide Hotline telephone number  [facility] assessment telephone number  [facility] Emergency Assistance 911  [facility] and/or Residential Mobile Crisis Unit telephone number  Request made of family/significant other to:  Remove weapons (e.g., guns, rifles, knives), all items previously/currently identified as safety concern.    Remove drugs/medications (over-the-counter, prescriptions, illicit drugs), all items previously/currently identified as a safety concern.  The patient other verbalizes understanding of the suicide prevention education information provided.  The family member/significant other agrees to remove the items of safety concern listed above.  Rodney JaegerMareida J Watson 04/06/2017, 5:27 PM

## 2017-04-08 ENCOUNTER — Encounter: Payer: Self-pay | Admitting: Emergency Medicine

## 2017-04-09 ENCOUNTER — Encounter (HOSPITAL_COMMUNITY): Payer: Self-pay | Admitting: *Deleted

## 2017-04-09 ENCOUNTER — Emergency Department (HOSPITAL_COMMUNITY)
Admission: EM | Admit: 2017-04-09 | Discharge: 2017-04-10 | Disposition: A | Discharge status: Home / Self Care | Payer: Medicaid Other | Attending: Emergency Medicine | Admitting: Emergency Medicine

## 2017-04-09 ENCOUNTER — Other Ambulatory Visit: Payer: Self-pay

## 2017-04-09 DIAGNOSIS — J449 Chronic obstructive pulmonary disease, unspecified: Secondary | ICD-10-CM | POA: Insufficient documentation

## 2017-04-09 DIAGNOSIS — R45851 Suicidal ideations: Secondary | ICD-10-CM | POA: Diagnosis not present

## 2017-04-09 DIAGNOSIS — F1014 Alcohol abuse with alcohol-induced mood disorder: Secondary | ICD-10-CM | POA: Diagnosis present

## 2017-04-09 DIAGNOSIS — F1010 Alcohol abuse, uncomplicated: Secondary | ICD-10-CM

## 2017-04-09 DIAGNOSIS — F17210 Nicotine dependence, cigarettes, uncomplicated: Secondary | ICD-10-CM | POA: Diagnosis not present

## 2017-04-09 LAB — ACETAMINOPHEN LEVEL: Acetaminophen (Tylenol), Serum: <10 ug/mL — ABNORMAL LOW (ref 10–30)

## 2017-04-09 LAB — COMPREHENSIVE METABOLIC PANEL
ALT: 15 U/L — ABNORMAL LOW (ref 17–63)
AST: 23 U/L (ref 15–41)
Albumin: 4.5 g/dL (ref 3.5–5.0)
Alkaline Phosphatase: 86 U/L (ref 38–126)
Anion gap: 13 (ref 5–15)
BUN: 13 mg/dL (ref 6–20)
CO2: 23 mmol/L (ref 22–32)
Calcium: 9.3 mg/dL (ref 8.9–10.3)
Chloride: 106 mmol/L (ref 101–111)
Creatinine, Ser: 0.77 mg/dL (ref 0.61–1.24)
GFR calc Af Amer: >60 mL/min (ref >60)
GFR calc non Af Amer: >60 mL/min (ref >60)
Glucose, Bld: 108 mg/dL — ABNORMAL HIGH (ref 65–99)
Potassium: 3.9 mmol/L (ref 3.5–5.1)
Sodium: 142 mmol/L (ref 135–145)
Total Bilirubin: 0.6 mg/dL (ref 0.3–1.2)
Total Protein: 8.1 g/dL (ref 6.5–8.1)

## 2017-04-09 LAB — CBC
HCT: 48.3 % (ref 39.0–52.0)
Hemoglobin: 17.1 g/dL — ABNORMAL HIGH (ref 13.0–17.0)
MCH: 32.1 pg (ref 26.0–34.0)
MCHC: 35.4 g/dL (ref 30.0–36.0)
MCV: 90.8 fL (ref 78.0–100.0)
Platelets: 229 10*3/uL (ref 150–400)
RBC: 5.32 MIL/uL (ref 4.22–5.81)
RDW: 14.8 % (ref 11.5–15.5)
WBC: 12.8 10*3/uL — ABNORMAL HIGH (ref 4.0–10.5)

## 2017-04-09 LAB — SALICYLATE LEVEL: Salicylate Lvl: <7 mg/dL (ref 2.8–30.0)

## 2017-04-09 LAB — RAPID URINE DRUG SCREEN, HOSP PERFORMED
Amphetamines: NOT DETECTED
Barbiturates: NOT DETECTED
Benzodiazepines: NOT DETECTED
Cocaine: NOT DETECTED
Opiates: NOT DETECTED
Tetrahydrocannabinol: NOT DETECTED

## 2017-04-09 LAB — ETHANOL: Alcohol, Ethyl (B): 394 mg/dL (ref <10)

## 2017-04-09 MED ORDER — TRAZODONE HCL 50 MG PO TABS: 50.0000 mg | ORAL_TABLET | Freq: Every evening | ORAL | Status: DC | PRN

## 2017-04-09 MED ORDER — MIRTAZAPINE 30 MG PO TABS: 15.0000 mg | ORAL_TABLET | Freq: Every day | ORAL | Status: DC

## 2017-04-09 MED ORDER — LORAZEPAM 2 MG/ML IJ SOLN: 0.0000 mg | Freq: Four times a day (QID) | INTRAMUSCULAR | Status: DC

## 2017-04-09 MED ORDER — LORAZEPAM 1 MG PO TABS
0.0000 mg | ORAL_TABLET | Freq: Four times a day (QID) | ORAL | Status: DC
  Administered 2017-04-10: 1 mg via ORAL
  Filled 2017-04-09: qty 1

## 2017-04-09 MED ORDER — LORAZEPAM 1 MG PO TABS: 0.0000 mg | ORAL_TABLET | Freq: Two times a day (BID) | ORAL | Status: DC

## 2017-04-09 MED ORDER — VITAMIN B-1 100 MG PO TABS: 100.0000 mg | ORAL_TABLET | Freq: Once | ORAL | Status: DC

## 2017-04-09 MED ORDER — PHENYTOIN SODIUM EXTENDED 100 MG PO CAPS
100.0000 mg | ORAL_CAPSULE | Freq: Three times a day (TID) | ORAL | Status: DC
  Administered 2017-04-10: 100 mg via ORAL
  Filled 2017-04-09: qty 1

## 2017-04-09 MED ORDER — FLUOXETINE HCL 10 MG PO CAPS
10.0000 mg | ORAL_CAPSULE | Freq: Every day | ORAL | Status: DC
  Administered 2017-04-10: 10 mg via ORAL
  Filled 2017-04-09: qty 1

## 2017-04-09 MED ORDER — ADULT MULTIVITAMIN W/MINERALS CH
1.0000 | ORAL_TABLET | Freq: Once | ORAL | Status: AC
  Administered 2017-04-10: 1 via ORAL
  Filled 2017-04-09: qty 1

## 2017-04-09 MED ORDER — GABAPENTIN 300 MG PO CAPS
300.0000 mg | ORAL_CAPSULE | Freq: Three times a day (TID) | ORAL | Status: DC
  Administered 2017-04-10: 300 mg via ORAL
  Filled 2017-04-09: qty 1

## 2017-04-09 MED ORDER — LORAZEPAM 2 MG/ML IJ SOLN: 0.0000 mg | Freq: Two times a day (BID) | INTRAMUSCULAR | Status: DC

## 2017-04-09 NOTE — ED Triage Notes (Addendum)
Pt states he wants to "kill myself" plans to use RR tracks. Pt brought [facility]GPD. Pt cc/o hip pain from fall tonight. Pt states he has been drinking

## 2017-04-09 NOTE — ED Notes (Signed)
Pt has been scrubed out and has been wanded by security. Pt's belongings are in 3 personal belonging bags located at triage nurses desk.

## 2017-04-09 NOTE — ED Notes (Signed)
Bed: WLPT4 Expected date:  Expected time:  Means of arrival:  Comments: 

## 2017-04-09 NOTE — ED Provider Notes (Signed)
[facility]-EMERGENCY DEPT Provider Note   CSN: [id number] Arrival date & time: 04/09/17  2032     History   Chief Complaint Chief Complaint  Patient presents with  . Suicidal    HPI Rodney Watson is a 52 y.o. male.  The history is provided by the patient and medical records. No language interpreter was used.   Rodney Watson is a 52 y.o. male  with a PMH of COPD, ETOH abuse, homelessness who presents to the Emergency Department complaining of suicidal thoughts.  Patient reports that he wanted to walk into ongoing traffic or onto the railroad tracks in an attempt to kill himself.  He reports being "tired of being so tired".  He reports homelessness plays a major part in this.  Daily drinker and did drink 2 pints of vodka today.  [employer] at bedside states that they received a phone call of patient walking around near traffic.  He expressed suicidal ideations, therefore was brought to emergency department.  Past Medical History:  Diagnosis Date  . Alcohol abuse   . COPD (chronic obstructive pulmonary disease) ([facility])   . Depression   . ETOH abuse   . Seizures [facility])     Patient Active Problem List   Diagnosis Date Noted  . Moderate episode of recurrent major depressive disorder ([facility])   . Severe episode of recurrent major depressive disorder, without psychotic features ([facility])   . MDD (major depressive disorder), severe ([facility]) 04/02/2017  . Alcohol abuse 04/01/2017    History reviewed. No pertinent surgical history.     Home Medications    Prior to Admission medications   Medication Sig Start Date End Date Taking? Authorizing Provider  phenytoin (DILANTIN) 100 MG ER capsule Take 1 capsule (100 mg total) by mouth 3 (three) times daily. 04/06/17  Yes Lord, Herminio HeadsJamison Y, NP  cephALEXin (KEFLEX) 500 MG capsule Take 1 capsule (500 mg total) by mouth 3 (three) times daily. 10/27/16   Tommi RumpsSummers, Rhonda L, PA-C  FLUoxetine (PROZAC) 10 MG capsule Take 1 capsule (10 mg total)  by mouth daily. 04/07/17   Charm RingsLord, Jamison Y, NP  gabapentin (NEURONTIN) 300 MG capsule Take 1 capsule (300 mg total) by mouth 3 (three) times daily. 04/06/17   Charm RingsLord, Jamison Y, NP  lidocaine (XYLOCAINE) 2 % jelly Apply 1 application topically 2 (two) times daily. Before washing area 10/27/16   Tommi RumpsSummers, Rhonda L, PA-C  mirtazapine (REMERON) 15 MG tablet Take 1 tablet (15 mg total) by mouth at bedtime. 04/06/17   Charm RingsLord, Jamison Y, NP  traZODone (DESYREL) 50 MG tablet Take 1 tablet (50 mg total) by mouth at bedtime as needed for sleep. 04/06/17   Charm RingsLord, Jamison Y, NP  triamcinolone cream (KENALOG) 0.1 % Apply topically 3 (three) times daily. 04/06/17   Charm RingsLord, Jamison Y, NP    Family History No family history on file.  Social History Social History   Tobacco Use  . Smoking status: Current Every Day Smoker    Packs/day: 1.50    Years: 39.00    Pack years: 58.50    Types: Cigarettes  . Smokeless tobacco: Current User    Types: Chew  Substance Use Topics  . Alcohol use: Yes    Comment: 5th daily  . Drug use: Yes    Types: Marijuana    Comment: 1-2 joints/week     Allergies   Chocolate; Chocolate; and Vicodin [hydrocodone-acetaminophen]   Review of Systems Review of Systems  Psychiatric/Behavioral: Positive for suicidal ideas.  All  other systems reviewed and are negative.    Physical Exam Updated Vital Signs BP (!) 140/113 (BP Location: Right Arm)   Pulse (!) 105   Temp 97.8 F (36.6 C) (Oral)   Resp 20   Ht 5\' 8"  (1.727 m)   SpO2 96%   BMI 25.09 kg/m   Physical Exam  Constitutional: He is oriented to person, place, and time. He appears well-developed and well-nourished. No distress.  HENT:  Head: Normocephalic and atraumatic.  Neck: Neck supple.  Cardiovascular: Normal rate, regular rhythm and normal heart sounds.  No murmur heard. Pulmonary/Chest: Effort normal and breath sounds normal. No respiratory distress.  Abdominal: Soft. He exhibits no distension. There is no  tenderness.  Neurological: He is alert and oriented to person, place, and time.  Skin: Skin is warm and dry.  Nursing note and vitals reviewed.    ED Treatments / Results  Labs (all labs ordered are listed, but only abnormal results are displayed) Labs Reviewed  COMPREHENSIVE METABOLIC PANEL - Abnormal; Notable for the following components:      Result Value   Glucose, Bld 108 (*)    ALT 15 (*)    All other components within normal limits  ETHANOL - Abnormal; Notable for the following components:   Alcohol, Ethyl (B) 394 (*)    All other components within normal limits  ACETAMINOPHEN LEVEL - Abnormal; Notable for the following components:   Acetaminophen (Tylenol), Serum <10 (*)    All other components within normal limits  CBC - Abnormal; Notable for the following components:   WBC 12.8 (*)    Hemoglobin 17.1 (*)    All other components within normal limits  SALICYLATE LEVEL  RAPID URINE DRUG SCREEN, HOSP PERFORMED    EKG  EKG Interpretation None       Radiology No results found.  Procedures Procedures (including critical care time)  Medications Ordered in ED Medications  multivitamin with minerals tablet 1 tablet (not administered)  thiamine (VITAMIN B-1) tablet 100 mg (not administered)  LORazepam (ATIVAN) injection 0-4 mg (not administered)    Or  LORazepam (ATIVAN) tablet 0-4 mg (not administered)  LORazepam (ATIVAN) injection 0-4 mg (not administered)    Or  LORazepam (ATIVAN) tablet 0-4 mg (not administered)     Initial Impression / Assessment and Plan / ED Course  I have reviewed the triage vital signs and the nursing notes.  Pertinent labs & imaging results that were available during my care of the patient were reviewed by me and considered in my medical decision making (see chart for details).    Rodney Watson is a 52 y.o. male who presents to ED for suicidal thoughts with plan to run out into traffic or jump in front of the railroad tracks.  Endorses alcohol use with EtOH of 394 in ED today.  Given multivitamin and thiamine.  Labs otherwise reassuring.  Placed on CIWA protocol with disposition pending psychiatry recommendations.   Final Clinical Impressions(s) / ED Diagnoses   Final diagnoses:  Alcohol abuse  Suicidal thoughts    ED Discharge Orders    None       [place]on Merry, Chase Picket, PA-C 04/09/17 2336    Raeford Razor, MD 04/10/17 [phone]

## 2017-04-10 DIAGNOSIS — F1014 Alcohol abuse with alcohol-induced mood disorder: Secondary | ICD-10-CM | POA: Diagnosis present

## 2017-04-10 MED ORDER — GABAPENTIN 300 MG PO CAPS: 300.0000 mg | ORAL_CAPSULE | Freq: Three times a day (TID) | ORAL | 0 refills | Status: DC

## 2017-04-10 MED ORDER — PHENYTOIN SODIUM EXTENDED 100 MG PO CAPS: 100.0000 mg | ORAL_CAPSULE | Freq: Three times a day (TID) | ORAL | 0 refills | Status: DC

## 2017-04-10 NOTE — BHH Suicide Risk Assessment (Signed)
Suicide Risk Assessment  Discharge Assessment   [facility]   Principal Problem: Alcohol abuse with alcohol-induced mood disorder [facility](HCC) Discharge Diagnoses:  Patient Active Problem List   Diagnosis Date Noted  . Alcohol abuse with alcohol-induced mood disorder (HCC) [F10.14] 04/10/2017    Priority: High  . Moderate episode of recurrent major depressive disorder (HCC) [F33.1]   . Alcohol abuse [F10.10] 04/01/2017    Total Time spent with patient: 45 minutes  Musculoskeletal: Strength & Muscle Tone: within normal limits Gait & Station: normal Patient leans: N/A  Psychiatric Specialty Exam:   Blood pressure 131/86, pulse (!) 105, temperature 97.8 F (36.6 C), temperature source Oral, resp. rate 18, height 5\' 8"  (1.727 m), SpO2 95 %.Body mass index is 25.09 kg/m.  General Appearance: Casual  Eye Contact::  Good  Speech:  Normal Rate409  Volume:  Normal  Mood:  Euthymic  Affect:  Congruent  Thought Process:  Coherent and Descriptions of Associations: Intact  Orientation:  Full (Time, Place, and Person)  Thought Content:  WDL and Logical  Suicidal Thoughts:  No  Homicidal Thoughts:  No  Memory:  Immediate;   Good Recent;   Good Remote;   Good  Judgement:  Fair  Insight:  Fair  Psychomotor Activity:  Normal  Concentration:  Good  Recall:  Good  Fund of Knowledge:Fair  Language: Good  Akathisia:  No  Handed:  Right  AIMS (if indicated):     Assets:  Leisure Time Physical Health Resilience Social Support  Sleep:     Cognition: WNL  ADL's:  Intact   Mental Status Per Nursing Assessment::   On Admission:   52 yo male who presented to the ED with alcohol intoxication and suicidal ideations.  [facility] and had refused recovery/rehab.  [facility] and AA information was provided but he did not follow-up.  Unfortunately, he went back to drinking shortly after discharge.  Today, he denies suicidal/homicidal ideations,  hallucinations, and withdrawal symptoms.  Demographic Factors:  Male and Caucasian  Loss Factors: NA  Historical Factors: NA  Risk Reduction Factors:   Sense of responsibility to family  Continued Clinical Symptoms:  None  Cognitive Features That Contribute To Risk:  None    Suicide Risk:  Minimal: No identifiable suicidal ideation.  Patients presenting with no risk factors but with morbid ruminations; may be classified as minimal risk based on the severity of the depressive symptoms    Plan Of Care/Follow-up recommendations:  Activity:  as tolerated Diet:  heart healthy diet  Hilde Churchman, NP 04/10/2017, 11:36 AM

## 2017-04-10 NOTE — Patient Outreach (Signed)
[facility]PSS met with the patient and provided substance use recovery support. Patient stated how he was only interested in getting connected with [facility] and the [facility]. Patient was recently discharged from [facility] on 04/06/17. CPSS provided information for the [facility] and the patient stated how he already has information about [facility] in his bag that he brought with him to the hospital. CPSS provided information for AA meetings in [place] and substance use outpatient/residential substance use recovery treatment resources. CPSS also provided CPSS contact information and encouraged the patient to contact CPSS at anytime for substance use recovery support.

## 2017-04-10 NOTE — ED Notes (Signed)
Pt discharged safely with Resources and RX.  RX and discharge instructions were reviewed prior to his departure.  All belongings were returned to pt.  Bus pass was given.  Pt was in no distress.
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
## 2017-04-10 NOTE — ED Notes (Signed)
Pt oriented to room and unit.  Pt contracts for safety.  He is in no acute distress. He went straight to bed upon arrival .  15 minute checks and video monitoring in place.

## 2017-04-11 ENCOUNTER — Emergency Department (HOSPITAL_COMMUNITY)
Admission: EM | Admit: 2017-04-11 | Discharge: 2017-04-13 | Disposition: A | Discharge status: Other Acute Inpatient Hospital | Payer: Medicaid Other | Attending: Emergency Medicine | Admitting: Emergency Medicine

## 2017-04-11 ENCOUNTER — Encounter (HOSPITAL_COMMUNITY): Payer: Self-pay | Admitting: Emergency Medicine

## 2017-04-11 DIAGNOSIS — F17210 Nicotine dependence, cigarettes, uncomplicated: Secondary | ICD-10-CM | POA: Diagnosis not present

## 2017-04-11 DIAGNOSIS — J449 Chronic obstructive pulmonary disease, unspecified: Secondary | ICD-10-CM | POA: Diagnosis not present

## 2017-04-11 DIAGNOSIS — F1020 Alcohol dependence, uncomplicated: Secondary | ICD-10-CM | POA: Diagnosis not present

## 2017-04-11 DIAGNOSIS — R45851 Suicidal ideations: Secondary | ICD-10-CM | POA: Diagnosis not present

## 2017-04-11 DIAGNOSIS — M25561 Pain in right knee: Secondary | ICD-10-CM | POA: Insufficient documentation

## 2017-04-11 DIAGNOSIS — F332 Major depressive disorder, recurrent severe without psychotic features: Secondary | ICD-10-CM | POA: Diagnosis not present

## 2017-04-11 DIAGNOSIS — F329 Major depressive disorder, single episode, unspecified: Secondary | ICD-10-CM | POA: Diagnosis present

## 2017-04-11 DIAGNOSIS — F1010 Alcohol abuse, uncomplicated: Secondary | ICD-10-CM

## 2017-04-11 LAB — COMPREHENSIVE METABOLIC PANEL
ALBUMIN: 4.4 g/dL (ref 3.5–5.0)
ALT: 15 U/L — ABNORMAL LOW (ref 17–63)
AST: 21 U/L (ref 15–41)
Alkaline Phosphatase: 83 U/L (ref 38–126)
Anion gap: 11 (ref 5–15)
BUN: 7 mg/dL (ref 6–20)
CO2: 26 mmol/L (ref 22–32)
Calcium: 8.7 mg/dL — ABNORMAL LOW (ref 8.9–10.3)
Chloride: 102 mmol/L (ref 101–111)
Creatinine, Ser: 0.77 mg/dL (ref 0.61–1.24)
GFR calc Af Amer: >60 mL/min (ref >60)
GFR calc non Af Amer: >60 mL/min (ref >60)
GLUCOSE: 98 mg/dL (ref 65–99)
POTASSIUM: 3.7 mmol/L (ref 3.5–5.1)
Sodium: 139 mmol/L (ref 135–145)
Total Bilirubin: 0.4 mg/dL (ref 0.3–1.2)
Total Protein: 7.6 g/dL (ref 6.5–8.1)

## 2017-04-11 LAB — SALICYLATE LEVEL: Salicylate Lvl: <7 mg/dL (ref 2.8–30.0)

## 2017-04-11 LAB — RAPID URINE DRUG SCREEN, HOSP PERFORMED
AMPHETAMINES: NOT DETECTED
BARBITURATES: NOT DETECTED
BENZODIAZEPINES: NOT DETECTED
Cocaine: NOT DETECTED
Opiates: NOT DETECTED
Tetrahydrocannabinol: NOT DETECTED

## 2017-04-11 LAB — CBC
HCT: 48.2 % (ref 39.0–52.0)
HEMOGLOBIN: 16.3 g/dL (ref 13.0–17.0)
MCH: 31.2 pg (ref 26.0–34.0)
MCHC: 33.8 g/dL (ref 30.0–36.0)
MCV: 92.2 fL (ref 78.0–100.0)
PLATELETS: 262 10*3/uL (ref 150–400)
RBC: 5.23 MIL/uL (ref 4.22–5.81)
RDW: 15 % (ref 11.5–15.5)
WBC: 9 10*3/uL (ref 4.0–10.5)

## 2017-04-11 LAB — ETHANOL: Alcohol, Ethyl (B): 351 mg/dL (ref <10)

## 2017-04-11 LAB — ACETAMINOPHEN LEVEL: Acetaminophen (Tylenol), Serum: <10 ug/mL — ABNORMAL LOW (ref 10–30)

## 2017-04-11 NOTE — ED Triage Notes (Signed)
BIB EMS from streets. Pt states he is suicidal, he states he wants to jump infront of a train. [facility]. ETOH on board, pt was aggressive en route.

## 2017-04-11 NOTE — ED Provider Notes (Signed)
[facility] EMERGENCY DEPARTMENT Provider Note   CSN: [id number] Arrival date & time: 04/11/17  2138     History   Chief Complaint Chief Complaint  Patient presents with  . Suicidal    HPI Rodney Watson is a 52 y.o. male with a hx of EtOH, COPD, Depression, seizures presents to the Emergency Department complaining of gradual, persistent, progressively worsening SI with plan to step out in front from a train onset evening.  Patient reports that he often feels suicidal but tonight he intended to "go through with it."  Nothing seems to make his symptoms better or worse.  Patient reports he only had "a little bit to drink" however he appears acutely intoxicated.  Patient also complaining of right knee pain for the last several days.  He denies known injury but reports that there is a wound on his knee.  No treatments prior to arrival.  Patient denies homicidal ideation, auditory or visual hallucinations.  He also denies numbness, tingling, weakness in the right leg.  He reports he has been able to ambulate.   The history is provided by the patient and medical records. No language interpreter was used.    Past Medical History:  Diagnosis Date  . Alcohol abuse   . COPD (chronic obstructive pulmonary disease) (HCC)   . Depression   . ETOH abuse   . Seizures [facility])     Patient Active Problem List   Diagnosis Date Noted  . Alcohol abuse with alcohol-induced mood disorder (HCC) 04/10/2017  . Moderate episode of recurrent major depressive disorder (HCC)   . Alcohol abuse 04/01/2017    History reviewed. No pertinent surgical history.     Home Medications    Prior to Admission medications   Medication Sig Start Date End Date Taking? Authorizing Provider  gabapentin (NEURONTIN) 300 MG capsule Take 1 capsule (300 mg total) by mouth 3 (three) times daily. 04/10/17   Charm Rings, NP  phenytoin (DILANTIN) 100 MG ER capsule Take 1 capsule (100 mg total) by mouth 3  (three) times daily. 04/10/17   Charm Rings, NP    Family History No family history on file.  Social History Social History   Tobacco Use  . Smoking status: Current Every Day Smoker    Packs/day: 1.50    Years: 39.00    Pack years: 58.50    Types: Cigarettes  . Smokeless tobacco: Current User    Types: Chew  Substance Use Topics  . Alcohol use: Yes    Comment: 5th daily  . Drug use: Yes    Types: Marijuana    Comment: 1-2 joints/week     Allergies   Chocolate; Chocolate; and Vicodin [hydrocodone-acetaminophen]   Review of Systems Review of Systems  Constitutional: Negative for appetite change, diaphoresis, fatigue, fever and unexpected weight change.  HENT: Negative for mouth sores.   Eyes: Negative for visual disturbance.  Respiratory: Negative for cough, chest tightness, shortness of breath and wheezing.   Cardiovascular: Negative for chest pain.  Gastrointestinal: Negative for abdominal pain, constipation, diarrhea, nausea and vomiting.  Endocrine: Negative for polydipsia, polyphagia and polyuria.  Genitourinary: Negative for dysuria, frequency, hematuria and urgency.  Musculoskeletal: Positive for arthralgias and joint swelling. Negative for back pain and neck stiffness.  Skin: Negative for rash.  Allergic/Immunologic: Negative for immunocompromised state.  Neurological: Negative for syncope, light-headedness and headaches.  Hematological: Does not bruise/bleed easily.  Psychiatric/Behavioral: Positive for suicidal ideas. Negative for sleep disturbance. The patient is not  nervous/anxious.      Physical Exam Updated Vital Signs BP 127/89 (BP Location: Right Arm)   Pulse 97   Temp 98.1 F (36.7 C) (Oral)   Resp 18   Ht 5\' 8"  (1.727 m)   Wt 74.8 kg (165 lb)   SpO2 96%   BMI 25.09 kg/m   Physical Exam  Constitutional: He appears well-developed and well-nourished. No distress.  Awake, alert, nontoxic appearance Somewhat slurred speech.  HENT:    Head: Normocephalic and atraumatic.  Mouth/Throat: Oropharynx is clear and moist. No oropharyngeal exudate.  Eyes: Conjunctivae are normal. No scleral icterus.  Neck: Normal range of motion. Neck supple.  Cardiovascular: Normal rate, regular rhythm and intact distal pulses.  Capillary refill < 3 sec  Pulmonary/Chest: Effort normal and breath sounds normal. No respiratory distress. He has no wheezes.  Equal chest expansion  Abdominal: Soft. Bowel sounds are normal. He exhibits no mass. There is no tenderness. There is no rebound and no guarding.  Musculoskeletal: Normal range of motion. He exhibits tenderness. He exhibits no edema.  Right knee: Full extension and flexion to 90 degrees with some pain.  Patient reports pain specifically to the patella.  Associated abrasions noted to the patella with tenderness to palpation.  No abnormal patellar movement.  Neurological: He is alert. Coordination normal.  Sensation tach to normal touch in the right lower extremity Strength 5/5 in the RLE  Skin: Skin is warm and dry. He is not diaphoretic.  No tenting of the skin  Psychiatric: He has a normal mood and affect. He is not actively hallucinating. He expresses suicidal ideation. He expresses no homicidal ideation. He expresses suicidal plans. He expresses no homicidal plans.  Nursing note and vitals reviewed.    ED Treatments / Results  Labs (all labs ordered are listed, but only abnormal results are displayed) Labs Reviewed  COMPREHENSIVE METABOLIC PANEL - Abnormal; Notable for the following components:      Result Value   Calcium 8.7 (*)    ALT 15 (*)    All other components within normal limits  ETHANOL - Abnormal; Notable for the following components:   Alcohol, Ethyl (B) 351 (*)    All other components within normal limits  ACETAMINOPHEN LEVEL - Abnormal; Notable for the following components:   Acetaminophen (Tylenol), Serum <10 (*)    All other components within normal limits   ETHANOL - Abnormal; Notable for the following components:   Alcohol, Ethyl (B) 225 (*)    All other components within normal limits  SALICYLATE LEVEL  CBC  RAPID URINE DRUG SCREEN, HOSP PERFORMED    Radiology Dg Knee Complete 4 Views Right  Result Date: 04/12/2017 CLINICAL DATA:  Pain and swelling. EXAM: RIGHT KNEE - COMPLETE 4+ VIEW COMPARISON:  None. FINDINGS: No evidence of fracture, dislocation, or joint effusion. No evidence of arthropathy or other focal bone abnormality. Soft tissues are unremarkable. IMPRESSION: Negative. Electronically Signed   By: Awilda Metro M.D.   On: 04/12/2017 01:19    Procedures Procedures (including critical care time)  Medications Ordered in ED Medications  LORazepam (ATIVAN) injection 0-4 mg (0 mg Intravenous Hold 04/12/17 0600)    Or  LORazepam (ATIVAN) tablet 0-4 mg ( Oral See Alternative 04/12/17 0600)  LORazepam (ATIVAN) injection 0-4 mg (not administered)    Or  LORazepam (ATIVAN) tablet 0-4 mg (not administered)  thiamine (VITAMIN B-1) tablet 100 mg (not administered)    Or  thiamine (B-1) injection 100 mg (not administered)  gabapentin (  NEURONTIN) capsule 300 mg (not administered)  phenytoin (DILANTIN) ER capsule 100 mg (not administered)     Initial Impression / Assessment and Plan / ED Course  I have reviewed the triage vital signs and the nursing notes.  Pertinent labs & imaging results that were available during my care of the patient were reviewed by me and considered in my medical decision making (see chart for details).  Clinical Course as of Apr 12 650  Thu Apr 11, 2017  2355 intoxicated Alcohol, Ethyl (B): (!!) 351 [HM]  Fri Apr 12, 2017  [id number] Patient will need to sober before TTS can be initiated. Alcohol, Ethyl (B): (!!) 351 [HM]  [id number] Decreased enough for TTS eval Alcohol, Ethyl (B): (!) 225 [HM]  [id number] I personally reviewed the images. DG Knee Complete 4 Views Right [HM]    Clinical Course User Index [HM]  [place] Hy, Dahlia ClientHannah, [place]PA-C   Patient presents with alcohol intoxication and suicidal ideation.  He was required to sober before TTS evaluation was ordered.  Patient also complaining of right knee pain.  Several abrasions noted.  Patient is able to flex and extend knee.  Doubt septic joint.  Abrasions are several days old.  Recommend he keep them clean  TTS evaluation pending.  Patient is medically cleared for evaluation.  Final Clinical Impressions(s) / ED Diagnoses   Final diagnoses:  Suicidal ideation  Alcohol abuse  Acute pain of right knee    ED Discharge Orders    None       Mardene SayerMuthersbaugh, Boyd KerbsHannah, PA-C 04/12/17 [id number]    Zadie RhineWickline, Donald, MD 04/12/17 [phone]

## 2017-04-11 NOTE — ED Notes (Signed)
Pt changed into paper scrubs. Wanded by security. Per staffing no sitter available. Charge RN aware

## 2017-04-12 ENCOUNTER — Emergency Department (HOSPITAL_COMMUNITY): Payer: Medicaid Other

## 2017-04-12 LAB — ETHANOL: Alcohol, Ethyl (B): 225 mg/dL — ABNORMAL HIGH (ref <10)

## 2017-04-12 MED ORDER — PHENYTOIN SODIUM EXTENDED 100 MG PO CAPS
100.0000 mg | ORAL_CAPSULE | Freq: Three times a day (TID) | ORAL | Status: DC
  Administered 2017-04-12 – 2017-04-13 (×4): 100 mg via ORAL
  Filled 2017-04-12 (×4): qty 1

## 2017-04-12 MED ORDER — THIAMINE HCL 100 MG/ML IJ SOLN: 100.0000 mg | Freq: Every day | INTRAMUSCULAR | Status: DC

## 2017-04-12 MED ORDER — LORAZEPAM 2 MG/ML IJ SOLN: 0.0000 mg | Freq: Two times a day (BID) | INTRAMUSCULAR | Status: DC

## 2017-04-12 MED ORDER — LORAZEPAM 1 MG PO TABS
0.0000 mg | ORAL_TABLET | Freq: Four times a day (QID) | ORAL | Status: DC
  Administered 2017-04-12 – 2017-04-13 (×4): 2 mg via ORAL
  Filled 2017-04-12 (×4): qty 2

## 2017-04-12 MED ORDER — LORAZEPAM 1 MG PO TABS: 0.0000 mg | ORAL_TABLET | Freq: Two times a day (BID) | ORAL | Status: DC

## 2017-04-12 MED ORDER — LORAZEPAM 2 MG/ML IJ SOLN: 0.0000 mg | Freq: Four times a day (QID) | INTRAMUSCULAR | Status: DC

## 2017-04-12 MED ORDER — VITAMIN B-1 100 MG PO TABS
100.0000 mg | ORAL_TABLET | Freq: Every day | ORAL | Status: DC
  Administered 2017-04-12 – 2017-04-13 (×2): 100 mg via ORAL
  Filled 2017-04-12 (×2): qty 1

## 2017-04-12 MED ORDER — GABAPENTIN 300 MG PO CAPS
300.0000 mg | ORAL_CAPSULE | Freq: Three times a day (TID) | ORAL | Status: DC
  Administered 2017-04-12 – 2017-04-13 (×4): 300 mg via ORAL
  Filled 2017-04-12 (×4): qty 1

## 2017-04-12 NOTE — ED Notes (Signed)
Pt having TTS, pharmacy tech at nurses station looking through pts belongings in front of this RN to see what meds the pt takes, sitter present

## 2017-04-12 NOTE — ED Notes (Signed)
[place], MD informed of pts CBG, pt to have bag lunch until tray arrives, pt lunch tray declined and reordered b/c pt does not want fish,

## 2017-04-12 NOTE — ED Notes (Signed)
Regular Diet was ordered for lunch.

## 2017-04-12 NOTE — ED Notes (Signed)
Patient was given a snack and drink. 
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
## 2017-04-13 ENCOUNTER — Encounter (HOSPITAL_COMMUNITY): Payer: Self-pay

## 2017-04-13 ENCOUNTER — Inpatient Hospital Stay (HOSPITAL_COMMUNITY)
Admission: AD | Admit: 2017-04-13 | Discharge: 2017-04-16 | DRG: 885 | Disposition: A | Discharge status: Home / Self Care | Payer: Medicaid Other | Source: Intra-hospital | Attending: Psychiatry | Admitting: Psychiatry

## 2017-04-13 ENCOUNTER — Other Ambulatory Visit: Payer: Self-pay

## 2017-04-13 DIAGNOSIS — F1290 Cannabis use, unspecified, uncomplicated: Secondary | ICD-10-CM | POA: Diagnosis not present

## 2017-04-13 DIAGNOSIS — F17210 Nicotine dependence, cigarettes, uncomplicated: Secondary | ICD-10-CM | POA: Diagnosis present

## 2017-04-13 DIAGNOSIS — Z885 Allergy status to narcotic agent status: Secondary | ICD-10-CM | POA: Diagnosis not present

## 2017-04-13 DIAGNOSIS — F10239 Alcohol dependence with withdrawal, unspecified: Secondary | ICD-10-CM | POA: Diagnosis present

## 2017-04-13 DIAGNOSIS — Z91018 Allergy to other foods: Secondary | ICD-10-CM

## 2017-04-13 DIAGNOSIS — R569 Unspecified convulsions: Secondary | ICD-10-CM | POA: Diagnosis present

## 2017-04-13 DIAGNOSIS — G4700 Insomnia, unspecified: Secondary | ICD-10-CM | POA: Diagnosis not present

## 2017-04-13 DIAGNOSIS — F1014 Alcohol abuse with alcohol-induced mood disorder: Secondary | ICD-10-CM

## 2017-04-13 DIAGNOSIS — Z736 Limitation of activities due to disability: Secondary | ICD-10-CM

## 2017-04-13 DIAGNOSIS — J449 Chronic obstructive pulmonary disease, unspecified: Secondary | ICD-10-CM | POA: Diagnosis present

## 2017-04-13 DIAGNOSIS — Z590 Homelessness: Secondary | ICD-10-CM | POA: Diagnosis not present

## 2017-04-13 DIAGNOSIS — F332 Major depressive disorder, recurrent severe without psychotic features: Secondary | ICD-10-CM | POA: Diagnosis present

## 2017-04-13 DIAGNOSIS — F1024 Alcohol dependence with alcohol-induced mood disorder: Secondary | ICD-10-CM | POA: Diagnosis present

## 2017-04-13 DIAGNOSIS — Z9119 Patient's noncompliance with other medical treatment and regimen: Secondary | ICD-10-CM | POA: Diagnosis not present

## 2017-04-13 DIAGNOSIS — R45851 Suicidal ideations: Secondary | ICD-10-CM

## 2017-04-13 DIAGNOSIS — F1010 Alcohol abuse, uncomplicated: Secondary | ICD-10-CM | POA: Diagnosis present

## 2017-04-13 DIAGNOSIS — F419 Anxiety disorder, unspecified: Secondary | ICD-10-CM | POA: Diagnosis not present

## 2017-04-13 DIAGNOSIS — Z23 Encounter for immunization: Secondary | ICD-10-CM | POA: Diagnosis not present

## 2017-04-13 DIAGNOSIS — F331 Major depressive disorder, recurrent, moderate: Secondary | ICD-10-CM | POA: Diagnosis not present

## 2017-04-13 DIAGNOSIS — R450 Nervousness: Secondary | ICD-10-CM | POA: Diagnosis not present

## 2017-04-13 MED ORDER — HYDROXYZINE HCL 25 MG PO TABS
25.0000 mg | ORAL_TABLET | Freq: Four times a day (QID) | ORAL | Status: AC | PRN
  Administered 2017-04-13 – 2017-04-16 (×8): 25 mg via ORAL
  Filled 2017-04-13 (×8): qty 1

## 2017-04-13 MED ORDER — MAGNESIUM HYDROXIDE 400 MG/5ML PO SUSP: 30.0000 mL | Freq: Every day | ORAL | Status: DC | PRN

## 2017-04-13 MED ORDER — TRAZODONE HCL 50 MG PO TABS
50.0000 mg | ORAL_TABLET | Freq: Every day | ORAL | Status: DC
  Administered 2017-04-13: 50 mg via ORAL
  Filled 2017-04-13 (×3): qty 1

## 2017-04-13 MED ORDER — MIRTAZAPINE 15 MG PO TABS
15.0000 mg | ORAL_TABLET | Freq: Every day | ORAL | Status: DC
  Administered 2017-04-13 – 2017-04-15 (×3): 15 mg via ORAL
  Filled 2017-04-13 (×7): qty 1

## 2017-04-13 MED ORDER — CHLORDIAZEPOXIDE HCL 25 MG PO CAPS
25.0000 mg | ORAL_CAPSULE | Freq: Four times a day (QID) | ORAL | Status: AC | PRN
  Administered 2017-04-13: 25 mg via ORAL
  Filled 2017-04-13: qty 1

## 2017-04-13 MED ORDER — NICOTINE POLACRILEX 2 MG MT GUM
2.0000 mg | CHEWING_GUM | OROMUCOSAL | Status: DC | PRN
  Administered 2017-04-13 – 2017-04-16 (×7): 2 mg via ORAL
  Filled 2017-04-13 (×5): qty 1

## 2017-04-13 MED ORDER — PNEUMOCOCCAL VAC POLYVALENT 25 MCG/0.5ML IJ INJ
0.5000 mL | INJECTION | INTRAMUSCULAR | Status: AC
  Administered 2017-04-14: 0.5 mL via INTRAMUSCULAR

## 2017-04-13 MED ORDER — TRIAMCINOLONE ACETONIDE 0.1 % EX CREA
1.0000 | TOPICAL_CREAM | Freq: Three times a day (TID) | CUTANEOUS | Status: DC
Start: 2017-04-14 — End: 2017-04-16
  Administered 2017-04-14 – 2017-04-16 (×4): 1 via TOPICAL
  Filled 2017-04-13: qty 15

## 2017-04-13 MED ORDER — LOPERAMIDE HCL 2 MG PO CAPS: 2.0000 mg | ORAL_CAPSULE | ORAL | Status: AC | PRN

## 2017-04-13 MED ORDER — THIAMINE HCL 100 MG/ML IJ SOLN: 100.0000 mg | Freq: Once | INTRAMUSCULAR | Status: DC

## 2017-04-13 MED ORDER — ALUM & MAG HYDROXIDE-SIMETH 200-200-20 MG/5ML PO SUSP: 30.0000 mL | ORAL | Status: DC | PRN

## 2017-04-13 MED ORDER — ONDANSETRON 4 MG PO TBDP
4.0000 mg | ORAL_TABLET | Freq: Four times a day (QID) | ORAL | Status: AC | PRN
Start: 2017-04-13 — End: 2017-04-16

## 2017-04-13 MED ORDER — PHENYTOIN SODIUM EXTENDED 100 MG PO CAPS
100.0000 mg | ORAL_CAPSULE | Freq: Three times a day (TID) | ORAL | Status: DC
  Administered 2017-04-13 – 2017-04-16 (×10): 100 mg via ORAL
  Filled 2017-04-13 (×16): qty 1

## 2017-04-13 MED ORDER — ADULT MULTIVITAMIN W/MINERALS CH
1.0000 | ORAL_TABLET | Freq: Every day | ORAL | Status: DC
  Administered 2017-04-14 – 2017-04-16 (×3): 1 via ORAL
  Filled 2017-04-13 (×6): qty 1

## 2017-04-13 MED ORDER — FLUOXETINE HCL 10 MG PO CAPS
10.0000 mg | ORAL_CAPSULE | Freq: Every day | ORAL | Status: DC
  Administered 2017-04-13 – 2017-04-16 (×4): 10 mg via ORAL
  Filled 2017-04-13 (×7): qty 1

## 2017-04-13 MED ORDER — VITAMIN B-1 100 MG PO TABS
100.0000 mg | ORAL_TABLET | Freq: Every day | ORAL | Status: DC
  Administered 2017-04-14 – 2017-04-16 (×3): 100 mg via ORAL
  Filled 2017-04-13 (×6): qty 1

## 2017-04-13 MED ORDER — INFLUENZA VAC SPLIT QUAD 0.5 ML IM SUSY
0.5000 mL | PREFILLED_SYRINGE | INTRAMUSCULAR | Status: AC
  Administered 2017-04-14: 0.5 mL via INTRAMUSCULAR
  Filled 2017-04-13: qty 0.5

## 2017-04-13 MED ORDER — GABAPENTIN 300 MG PO CAPS
300.0000 mg | ORAL_CAPSULE | Freq: Three times a day (TID) | ORAL | Status: DC
  Administered 2017-04-13 – 2017-04-16 (×10): 300 mg via ORAL
  Filled 2017-04-13 (×20): qty 1

## 2017-04-13 NOTE — ED Notes (Signed)
Pt requesting Sprite to drink. Reminded pt of snack and meal times, but agreed to get it for him at this time since he had been sleeping soundly at the 2100 snack time tonight.

## 2017-04-13 NOTE — ED Notes (Signed)
Pt voiced understanding and agreement w/tx plan [facility] - may arrive at 1230. Pt signed consent forms - [facility], copy sent to [facility] Records, and [facility].

## 2017-04-13 NOTE — Progress Notes (Signed)
Per Steffanie RainwaterLindsey, [facility] rm [facility]. Accepted by Leighton Ruffina Okonkwo, NP.  Pt can come at 12:30 on 04/13/17. Garner NashGregory Katisha Shimizu, MSW, LCSW Clinical Social Worker 04/13/2017 11:21 AM

## 2017-04-13 NOTE — ED Notes (Signed)
Pt has been ambulating to bathroom and back to room x 3 this am w/o difficulty. When pt is sleeping, no tremors noted. When RN woke pt so may take his meds, pt began shaking his hands. Pt took meds w/o difficulty - no shaking/tremors noted - pt was able to place med cup and cup of water to his mouth w/o difficulty.

## 2017-04-13 NOTE — ED Provider Notes (Signed)
Dr. Jama Flavorsobos - ac cepted   Eber HongMiller, Tatelyn Vanhecke, MD 04/13/17 [phone]

## 2017-04-13 NOTE — Progress Notes (Signed)
D: Pt  Passive SI-contracts for safety. denies HI/AVH. Pt is pleasant and cooperative. Pt stated he was feeling "confused " at this time , but is hopeful to be doing better.   A: Pt was offered support and encouragement. Pt was given scheduled medications. Pt was encourage to attend groups. Q 15 minute checks were done for safety.    R:Pt attends groups and interacts well with peers and staff. Pt is taking medication. Pt has no complaints.Pt receptive to treatment and safety maintained on unit.

## 2017-04-13 NOTE — Consult Note (Signed)
Telepsych Consultation   Reason for Consult: Alcohol use disorder Referring Physician: Agapito Games Location of Patient: [facility] ED Location of Provider: [facility]  Patient Identification: Rodney Watson MRN:  [id number] Principal Diagnosis: <principal problem not specified> Diagnosis:   Patient Active Problem List   Diagnosis Date Noted  . Alcohol abuse with alcohol-induced mood disorder ([place]) [F10.14] 04/10/2017  . Moderate episode of recurrent major depressive disorder ([place]) [F33.1]   . Alcohol abuse [F10.10] 04/01/2017    Total Time spent with patient: 30 minutes  Subjective:   Rodney Watson is a 52 y.o. male patient admitted with Alcohol use disorder, Severe; Major depressive disorder, Recurrent episode, Severe.  HPI: Per the TTS assessment completed on 04/12/17 by Despina Hidden: Rodney Watson is an 52 y.o. male [facility] to [facility] with complaints of depression and suicidal ideation with a plan to step in front of train. Patient report he was recently discharged from behavioral health on Saturday, shortly after his discharge patient relapse on alcohol triggered by feelings of depression. Patient report he really wants to stop drinking and attend therapy to address his depression. Patient not forth coming on trigger for depressive symptoms. Patient report he last drink alcohol 2.21.2019. Patient denies homicidal ideation; auditory / visual hallucinations.    On Exam: Patient was seen via tele-psych, chart reviewed with treatment team. Patient in bed, awake, alert and oriented x4. Patient reiterated the reason for this hospital admission as documented above. Patient stated, "I came to the hospital because of depression and suicide ideation". Depression has been off and on for about a week. He stated since he was discharged from [facility], that he has not been able to get his medications because he don't have the money for the co-pay. He reported that he  gets his disability check on the first of the month. Patient continues to endorse suicidal ideation with plan to walk in front of the train. Patient's stated that he wants to go back to the [facility] because he likes to help that he got there and the treatments too. Patient acknowledges that he drinks too much and he wants to stop. He said that its difficult to stop a habits he is used to for a long time. Patient understands the implications of excessive drinking including financial, emotional, mental and medical burdens. Patient denies any homicide ideation as well as visual and auditory hallucinations. Patient is unable to contract for safety upon discharge.  Past Psychiatric History: As in H&P  Risk to Self: Suicidal Ideation: Yes-Currently Present(Pt report SI thought w/ a plan ) Suicidal Intent: No(pt report he last had a SI intent in 1989 ) Is patient at risk for suicide?: Yes Suicidal Plan?: Yes-Currently Present Specify Current Suicidal Plan: Get hit by a freight train Access to Means: Yes Specify Access to Suicidal Means: Go to train track or railyard What has been your use of drugs/alcohol within the last 12 months?: alcohol, THC How many times?: 1(report SI attempt in 1989) Other Self Harm Risks: none report Triggers for Past Attempts: Other (Comment)(depression, other personal issues pt did not disclose) Intentional Self Injurious Behavior: None Risk to Others: Homicidal Ideation: No Thoughts of Harm to Others: No Current Homicidal Intent: No Current Homicidal Plan: No Access to Homicidal Means: No Identified Victim: no identified victim History of harm to others?: No Assessment of Violence: None Noted Violent Behavior Description: Pt denies Does patient have access to weapons?: No Criminal Charges Pending?: No Does patient have a court date:  No Prior Inpatient Therapy: Prior Inpatient Therapy: Yes Prior Therapy Dates: 02/2017; 2009 Prior Therapy Facilty/Provider(s):  [facility] Reason for Treatment: Alcohol use Prior Outpatient Therapy: Prior Outpatient Therapy: No Prior Therapy Dates: NA Prior Therapy Facilty/Provider(s): NA Reason for Treatment: NA Does patient have an ACCT team?: No Does patient have Intensive In-House Services?  : No Does patient have [facility] services? : No Does patient have P4CC services?: No  Past Medical History:  Past Medical History:  Diagnosis Date  . Alcohol abuse   . COPD (chronic obstructive pulmonary disease) ([place])   . Depression   . ETOH abuse   . Seizures ([place])    History reviewed. No pertinent surgical history. Family History: No family history on file. Family Psychiatric  History: Unknown Social History:  Social History   Substance and Sexual Activity  Alcohol Use Yes   Comment: 5th daily     Social History   Substance and Sexual Activity  Drug Use Yes  . Types: Marijuana   Comment: 1-2 joints/week    Social History   Socioeconomic History  . Marital status: Single    Spouse name: None  . Number of children: None  . Years of education: None  . Highest education level: None  Social Needs  . Financial resource strain: None  . Food insecurity - worry: None  . Food insecurity - inability: None  . Transportation needs - medical: None  . Transportation needs - non-medical: None  Occupational History  . None  Tobacco Use  . Smoking status: Current Every Day Smoker    Packs/day: 1.50    Years: 39.00    Pack years: 58.50    Types: Cigarettes  . Smokeless tobacco: Current User    Types: Chew  Substance and Sexual Activity  . Alcohol use: Yes    Comment: 5th daily  . Drug use: Yes    Types: Marijuana    Comment: 1-2 joints/week  . Sexual activity: No  Other Topics Concern  . None  Social History Narrative   ** Merged History Encounter **       Additional Social History:    Allergies:   Allergies  Allergen Reactions  . Chocolate   . Chocolate Hives  . Vicodin  [Hydrocodone-Acetaminophen] Nausea And Vomiting    Labs:  Results for orders placed or performed during the hospital encounter of 04/11/17 (from the past 48 hour(s))  Comprehensive metabolic panel     Status: Abnormal   Collection Time: 04/11/17  9:51 PM  Result Value Ref Range   Sodium 139 135 - 145 mmol/L   Potassium 3.7 3.5 - 5.1 mmol/L   Chloride 102 101 - 111 mmol/L   CO2 26 22 - 32 mmol/L   Glucose, Bld 98 65 - 99 mg/dL   BUN 7 6 - 20 mg/dL   Creatinine, Ser 0.77 0.61 - 1.24 mg/dL   Calcium 8.7 (L) 8.9 - 10.3 mg/dL   Total Protein 7.6 6.5 - 8.1 g/dL   Albumin 4.4 3.5 - 5.0 g/dL   AST 21 15 - 41 U/L   ALT 15 (L) 17 - 63 U/L   Alkaline Phosphatase 83 38 - 126 U/L   Total Bilirubin 0.4 0.3 - 1.2 mg/dL   GFR calc non Af Amer >60 >60 mL/min   GFR calc Af Amer >60 >60 mL/min    Comment: (NOTE) The eGFR has been calculated using the CKD EPI equation. This calculation has not been validated in all clinical situations. eGFR's persistently <60  mL/min signify possible Chronic Kidney Disease.    Anion gap 11 5 - 15    Comment: Performed at [facility] [address], [place], [place] [address]  Ethanol     Status: Abnormal   Collection Time: 04/11/17  9:51 PM  Result Value Ref Range   Alcohol, Ethyl (B) 351 (HH) <10 mg/dL    Comment:        LOWEST DETECTABLE LIMIT FOR SERUM ALCOHOL IS 10 mg/dL FOR MEDICAL PURPOSES ONLY CRITICAL RESULT CALLED TO, READ BACK BY AND VERIFIED WITH: PHILLIPS,T RN 04/11/2017 2324 JORDANS Performed at [facility], [place] [address], [place], [place] [address]   Salicylate level     Status: None   Collection Time: 04/11/17  9:51 PM  Result Value Ref Range   Salicylate Lvl <6.2 2.8 - 30.0 mg/dL    Comment: Performed at [facility] [address], [place], [place] [address]  Acetaminophen level     Status: Abnormal   Collection Time: 04/11/17  9:51 PM  Result Value Ref Range   Acetaminophen (Tylenol), Serum <10 (L) 10 - 30 ug/mL     Comment:        THERAPEUTIC CONCENTRATIONS VARY SIGNIFICANTLY. A RANGE OF 10-30 ug/mL MAY BE AN EFFECTIVE CONCENTRATION FOR MANY PATIENTS. HOWEVER, SOME ARE BEST TREATED AT CONCENTRATIONS OUTSIDE THIS RANGE. ACETAMINOPHEN CONCENTRATIONS >150 ug/mL AT 4 HOURS AFTER INGESTION AND >50 ug/mL AT 12 HOURS AFTER INGESTION ARE OFTEN ASSOCIATED WITH TOXIC REACTIONS. Performed at [facility], [place] [address], [place], [place] [address]   cbc     Status: None   Collection Time: 04/11/17  9:51 PM  Result Value Ref Range   WBC 9.0 4.0 - 10.5 K/uL   RBC 5.23 4.22 - 5.81 MIL/uL   Hemoglobin 16.3 13.0 - 17.0 g/dL   HCT 48.2 39.0 - 52.0 %   MCV 92.2 78.0 - 100.0 fL   MCH 31.2 26.0 - 34.0 pg   MCHC 33.8 30.0 - 36.0 g/dL   RDW 15.0 11.5 - 15.5 %   Platelets 262 150 - 400 K/uL    Comment: Performed at [facility] [address] [place], [place] [address]  Rapid urine drug screen (hospital performed)     Status: None   Collection Time: 04/11/17  9:51 PM  Result Value Ref Range   Opiates NONE DETECTED NONE DETECTED   Cocaine NONE DETECTED NONE DETECTED   Benzodiazepines NONE DETECTED NONE DETECTED   Amphetamines NONE DETECTED NONE DETECTED   Tetrahydrocannabinol NONE DETECTED NONE DETECTED   Barbiturates NONE DETECTED NONE DETECTED    Comment: (NOTE) DRUG SCREEN FOR MEDICAL PURPOSES ONLY.  IF CONFIRMATION IS NEEDED FOR ANY PURPOSE, NOTIFY LAB WITHIN 5 DAYS. LOWEST DETECTABLE LIMITS FOR URINE DRUG SCREEN Drug Class                     Cutoff (ng/mL) Amphetamine and metabolites    1000 Barbiturate and metabolites    200 Benzodiazepine                 626 Tricyclics and metabolites     300 Opiates and metabolites        300 Cocaine and metabolites        300 THC                            50 Performed at [facility], [place] [address], [place], [place] [address]   Ethanol     Status: Abnormal   Collection Time: 04/12/17  2:39 AM  Result Value Ref Range    Alcohol, Ethyl (B) 225 (H) <10 mg/dL    Comment:        LOWEST DETECTABLE LIMIT FOR SERUM ALCOHOL IS 10 mg/dL FOR MEDICAL PURPOSES ONLY Performed at [facility], [place] [address], [place], [place] [address]     Medications:  Current Facility-Administered Medications  Medication Dose Route Frequency Provider Last Rate Last Dose  . gabapentin (NEURONTIN) capsule 300 mg  300 mg Oral TID Muthersbaugh, Jarrett Soho, PA-C   300 mg at 04/12/17 2231  . LORazepam (ATIVAN) injection 0-4 mg  0-4 mg Intravenous Q6H Muthersbaugh, Jarrett Soho, PA-C   Stopped at 04/12/17 0134   Or  . LORazepam (ATIVAN) tablet 0-4 mg  0-4 mg Oral Q6H Muthersbaugh, Hannah, PA-C   2 mg at 04/13/17 7425  . [START ON 04/14/2017] LORazepam (ATIVAN) injection 0-4 mg  0-4 mg Intravenous Q12H Muthersbaugh, Hannah, PA-C       Or  . Derrill Memo ON 04/14/2017] LORazepam (ATIVAN) tablet 0-4 mg  0-4 mg Oral Q12H Muthersbaugh, Hannah, PA-C      . phenytoin (DILANTIN) ER capsule 100 mg  100 mg Oral TID Muthersbaugh, Hannah, PA-C   100 mg at 04/12/17 2231  . thiamine (VITAMIN B-1) tablet 100 mg  100 mg Oral Daily Muthersbaugh, Jarrett Soho, PA-C   100 mg at 04/12/17 9563   Or  . thiamine (B-1) injection 100 mg  100 mg Intravenous Daily Muthersbaugh, Jarrett Soho, PA-C       Current Outpatient Medications  Medication Sig Dispense Refill  . FLUoxetine (PROZAC) 10 MG tablet Take 10 mg by mouth daily.    . mirtazapine (REMERON) 15 MG tablet Take 15 mg by mouth at bedtime.    . traZODone (DESYREL) 50 MG tablet Take 50 mg by mouth at bedtime.    . triamcinolone cream (KENALOG) 0.1 % Apply 1 application topically 3 (three) times daily.    Marland Kitchen gabapentin (NEURONTIN) 300 MG capsule Take 1 capsule (300 mg total) by mouth 3 (three) times daily. 90 capsule 0  . phenytoin (DILANTIN) 100 MG ER capsule Take 1 capsule (100 mg total) by mouth 3 (three) times daily. 90 capsule 0    Musculoskeletal: UTA via camera  Psychiatric Specialty Exam: Physical Exam  Nursing note  and vitals reviewed.   Review of Systems  Psychiatric/Behavioral: Positive for depression, substance abuse and suicidal ideas. Negative for hallucinations. The patient is not nervous/anxious and does not have insomnia.   All other systems reviewed and are negative.   Blood pressure (!) 142/76, pulse 70, temperature 97.8 F (36.6 C), temperature source Oral, resp. rate 16, height _0  (1.727 m), weight 74.8 kg (165 lb), SpO2 98 %.Body mass index is 25.09 kg/m.  General Appearance: on hospital scrub  Eye Contact:  Good  Speech:  Clear and Coherent and Normal Rate  Volume:  Normal  Mood:  Anxious and Depressed  Affect:  Blunt  Thought Process:  Coherent and Goal Directed  Orientation:  Full (Time, Place, and Person)  Thought Content:  WDL and Logical  Suicidal Thoughts:  Yes.  with intent/plan  Homicidal Thoughts:  No  Memory:  Immediate;   Good Recent;   Good Remote;   Fair  Judgement:  Intact  Insight:  Fair and Present  Psychomotor Activity:  Normal  Concentration:  Concentration: Good and Attention  Span: Good  Recall:  Good  Fund of Knowledge:  Good  Language:  Good  Akathisia:  Negative  Handed:  Right  AIMS (if indicated):     Assets:  Communication Skills Desire for Improvement Financial Resources/Insurance Social Support  ADL's:  Intact  Cognition:  WNL  Sleep:        Treatment Plan Summary: Daily contact with patient to assess and evaluate symptoms and progress in treatment, Medication management and Plan to admit to psych inpatient   Disposition: Recommend psychiatric Inpatient admission when medically cleared.  This service was provided via telemedicine using a 2-way, interactive audio and video technology.  Names of all persons participating in this telemedicine service and their role in this encounter. Name: Rodney Watson Role: Patient  Name: Rodney Watson  Role: NP           Vicenta Aly, NP 04/13/2017 10:06 AM

## 2017-04-13 NOTE — ED Notes (Signed)
Patient was given a snack and drink, and A Regular Diet Ordered for Lunch.

## 2017-04-13 NOTE — Progress Notes (Signed)
Rodney Watson is a 52 year old male being admitted voluntarily to [facility] from [facility].  He came to the ED with suicidal ideation and substance abuse.  [facility], he continues to report suicidal ideation but will contract for safety on the unit.  He denies HI or A/V hallucinations.  He reported drinking 1/5 of liquor daily after he was [facility].  He was pleasant and cooperative.  Alert and oriented X 3.  Oriented him to the unit.  Admission paperwork completed and signed.  Belongings searched and secured in locker # 33, no contraband found.  Skin assessment completed and noted scaly sore on left palm and red area on both buttocks.  Q 15 minute checks initiated for safety.  We will continue to monitor the progress towards his goals.

## 2017-04-13 NOTE — Tx Team (Signed)
Initial Treatment Plan 04/13/2017 1:12 PM Rodney Watson ZOX:[id number]    PATIENT STRESSORS: Financial difficulties Substance abuse   PATIENT STRENGTHS: Capable of independent living Communication skills General fund of knowledge   PATIENT IDENTIFIED PROBLEMS: Substance abuse  Depression  Suicidal ideation  "To get better"  "Get off alcohol"             DISCHARGE CRITERIA:  Improved stabilization in mood, thinking, and/or behavior Verbal commitment to aftercare and medication compliance Withdrawal symptoms are absent or subacute and managed without 24-hour nursing intervention  PRELIMINARY DISCHARGE PLAN: Attend 12-step recovery group Outpatient therapy Medication management  PATIENT/FAMILY INVOLVEMENT: This treatment plan has been presented to and reviewed with the patient, Rodney Watson.  The patient and family have been given the opportunity to ask questions and make suggestions.  Levin BaconHeather V Juriel Cid, RN 04/13/2017, 1:12 PM

## 2017-04-14 DIAGNOSIS — Z590 Homelessness: Secondary | ICD-10-CM

## 2017-04-14 DIAGNOSIS — F17210 Nicotine dependence, cigarettes, uncomplicated: Secondary | ICD-10-CM

## 2017-04-14 DIAGNOSIS — F1014 Alcohol abuse with alcohol-induced mood disorder: Secondary | ICD-10-CM

## 2017-04-14 DIAGNOSIS — F332 Major depressive disorder, recurrent severe without psychotic features: Principal | ICD-10-CM

## 2017-04-14 MED ORDER — ENSURE ENLIVE PO LIQD
237.0000 mL | Freq: Two times a day (BID) | ORAL | Status: DC | PRN
  Administered 2017-04-14: 237 mL via ORAL
  Filled 2017-04-14: qty 237

## 2017-04-14 MED ORDER — ACAMPROSATE CALCIUM 333 MG PO TBEC
666.0000 mg | DELAYED_RELEASE_TABLET | Freq: Three times a day (TID) | ORAL | Status: DC
  Administered 2017-04-14 – 2017-04-16 (×7): 666 mg via ORAL
  Filled 2017-04-14 (×12): qty 2

## 2017-04-14 MED ORDER — TRAZODONE HCL 50 MG PO TABS
50.0000 mg | ORAL_TABLET | Freq: Every evening | ORAL | Status: DC | PRN
  Administered 2017-04-14: 50 mg via ORAL

## 2017-04-14 MED ORDER — TRAZODONE HCL 100 MG PO TABS
100.0000 mg | ORAL_TABLET | Freq: Every day | ORAL | Status: DC
  Filled 2017-04-14 (×2): qty 1

## 2017-04-14 NOTE — Progress Notes (Signed)
D: Pt passive SI-contracts for safety. Pt stated he was feeling better, pt seen in dayroom playing cards with peers  A: Pt was offered support and encouragement. Pt was given scheduled medications. Pt was encourage to attend groups. Q 15 minute checks were done for safety.   R:Pt attends groups and interacts well with peers and staff. Pt is taking medication. Pt has no complaints.Pt receptive to treatment and safety maintained on unit.
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
## 2017-04-14 NOTE — Progress Notes (Signed)
NUTRITION ASSESSMENT  Pt identified as at risk on the Malnutrition Screen Tool  INTERVENTION: 1. Supplements: Ensure Enlive po PRN, each supplement provides 350 kcal and 20 grams of protein  NUTRITION DIAGNOSIS: Unintentional weight loss related to sub-optimal intake as evidenced by pt report.   Goal: Pt to meet >/= 90% of their estimated nutrition needs.  Monitor:  PO intake  Assessment:  Pt admitted with ETOH abuse. Pt reports drinking 1/5th of liquor daily. Per chart review, pt with scaled weight for this admission. Prior weights suspected to be stated weights. Will order Ensure PRN if pt is unable to eat >50% of meals.   Height: Ht Readings from Last 1 Encounters:  04/13/17 5' 5.5" (1.664 m)    Weight: Wt Readings from Last 1 Encounters:  04/13/17 134 lb 8 oz (61 kg)    Weight Hx: Wt Readings from Last 10 Encounters:  04/13/17 134 lb 8 oz (61 kg)  04/11/17 165 lb (74.8 kg)  03/30/17 165 lb (74.8 kg)  10/27/16 130 lb (59 kg)  09/14/16 160 lb (72.6 kg)    BMI:  Body mass index is 22.04 kg/m. Pt meets criteria for normal based on current BMI.  Estimated Nutritional Needs: Kcal: 25-30 kcal/kg Protein: > 1 gram protein/kg Fluid: 1 ml/kcal  Diet Order: Diet regular Room service appropriate? Yes; Fluid consistency: Thin Pt is also offered choice of unit snacks mid-morning and mid-afternoon.  Pt is eating as desired.   Lab results and medications reviewed.   Tilda FrancoLindsey Anali Cabanilla, MS, RD, LDN [facility] Clinical Dietitian Pager: [phone] After Hours Pager: [phone]

## 2017-04-14 NOTE — Plan of Care (Signed)
  Coping: Ability to cope will improve 04/14/2017 1957 - Progressing by Rodney Watson, Rodney Petsch A, RN Note Pt passive SI-contracts for safety

## 2017-04-14 NOTE — [facility] Group Notes (Signed)
[facility] LCSW Group Therapy Note  Date/Time:  04/14/2017  11:00AM-12:00PM  Type of Therapy and Topic:  Group Therapy:  Music and Mood  Participation Level:  Did Not Attend   Description of Group: In this process group, members listened to a variety of genres of music and identified that different types of music evoke different responses.  Patients were encouraged to identify music that was soothing for them and music that was energizing for them.  Patients discussed how this knowledge can help with wellness and recovery in various ways including managing depression and anxiety as well as encouraging healthy sleep habits.    Therapeutic Goals: 1. Patients will explore the impact of different varieties of music on mood 2. Patients will verbalize the thoughts they have when listening to different types of music 3. Patients will identify music that is soothing to them as well as music that is energizing to them 4. Patients will discuss how to use this knowledge to assist in maintaining wellness and recovery 5. Patients will explore the use of music as a coping skill  Summary of Patient Progress:  N/A  Therapeutic Modalities: Solution Focused Brief Therapy Activity   Maribelle Hopple Grossman-Orr, LCSW    
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
## 2017-04-14 NOTE — Progress Notes (Signed)
D. Pt presents with an anxious affect and depressed behavior. Pt mildly agitated this am upon approach, endorsing some anxiety. Pt reports little relief from vistaril. Per pt's self inventory, pt rates his depression, hopelessness and anxiety a 7/6/7, respectively. Pt currently denies pain, SI/HI and AV hallucinations.  A. Labs and vitals monitored. Pt compliant with medications. Pt supported emotionally and encouraged to express concerns and ask questions.   R. Pt remains safe with 15 minute checks. Will continue POC.

## 2017-04-14 NOTE — Progress Notes (Signed)
Patient did attend the evening speaker AA meeting.  

## 2017-04-14 NOTE — [facility] Suicide Risk Assessment (Signed)
[facility] INPATIENT:  Family/Significant Other Suicide Prevention Education  Suicide Prevention Education:  Patient Refusal for Family/Significant Other Suicide Prevention Education: The patient Rodney Watson has refused to provide written consent for family/significant other to be provided Family/Significant Other Suicide Prevention Education during admission and/or prior to discharge.  Physician notified.  PATIENT REPORTS HE HAS NOBODY TO WHOM SPE COULD BE GIVEN.  Carloyn JaegerMareida J Grossman-Orr 04/14/2017, 4:40 PM
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
## 2017-04-15 DIAGNOSIS — F1290 Cannabis use, unspecified, uncomplicated: Secondary | ICD-10-CM

## 2017-04-15 DIAGNOSIS — G4700 Insomnia, unspecified: Secondary | ICD-10-CM

## 2017-04-15 DIAGNOSIS — F419 Anxiety disorder, unspecified: Secondary | ICD-10-CM

## 2017-04-15 DIAGNOSIS — R450 Nervousness: Secondary | ICD-10-CM

## 2017-04-15 LAB — PHENYTOIN LEVEL, TOTAL: Phenytoin Lvl: 5.8 ug/mL — ABNORMAL LOW (ref 10.0–20.0)

## 2017-04-15 MED ORDER — TRAZODONE HCL 100 MG PO TABS
100.0000 mg | ORAL_TABLET | Freq: Every evening | ORAL | Status: DC | PRN
  Administered 2017-04-15: 100 mg via ORAL

## 2017-04-15 NOTE — Progress Notes (Signed)
Pt attended AA group this evening.  

## 2017-04-15 NOTE — Plan of Care (Signed)
Patient has had no injuries this shift, self inflicted or otherwise. Patient has taken his medications as scheduled. Patient has not attended most of the unit activities this shift.

## 2017-04-15 NOTE — Progress Notes (Signed)
D: Pt denies SI/HI/AVH. Pt is pleasant and cooperative. Pt stated he was stressed about the future, pt said he really wanted to stop drinking, he was not going to stop "smoking pot". Pt stated his anxiety was high today due to his stressors.   A: Pt was offered support and encouragement. Pt was given scheduled medications. Pt was encourage to attend groups. Q 15 minute checks were done for safety.   R:Pt attends groups and interacts well with peers and staff. Pt is taking medication. Pt has no complaints.Pt receptive to treatment and safety maintained on unit.

## 2017-04-15 NOTE — [facility] Group Notes (Signed)
LCSW Group Therapy Note   04/15/2017 1:15pm   Type of Therapy and Topic:  Group Therapy:  Overcoming Obstacles   Participation Level:  Active   Description of Group:    In this group patients will be encouraged to explore what they see as obstacles to their own wellness and recovery. They will be guided to discuss their thoughts, feelings, and behaviors related to these obstacles. The group will process together ways to cope with barriers, with attention given to specific choices patients can make. Each patient will be challenged to identify changes they are motivated to make in order to overcome their obstacles. This group will be process-oriented, with patients participating in exploration of their own experiences as well as giving and receiving support and challenge from other group members.   Therapeutic Goals: 1. Patient will identify personal and current obstacles as they relate to admission. 2. Patient will identify barriers that currently interfere with their wellness or overcoming obstacles.  3. Patient will identify feelings, thought process and behaviors related to these barriers. 4. Patient will identify two changes they are willing to make to overcome these obstacles:      Summary of Patient Progress   Rodney Watson was attentive and engaged during today's processing group. He shared that his biggest obstacle involves "staying away from people who drink and use drugs. "They are my friends. It's hard to turn my back on them." Rodney Watson was open to feedback and advice from the group and plans to [facility] to get linked with housing and community support services after discharge. He continues to show progress in the group setting with improving insight.    Therapeutic Modalities:   Cognitive Behavioral Therapy Solution Focused Therapy Motivational Interviewing Relapse Prevention Therapy  Ledell PeoplesHeather N Smart, LCSW 04/15/2017 3:18 PM

## 2017-04-15 NOTE — Progress Notes (Signed)
Psychoeducational Group Note  Date:  04/15/2017 Time:  0932  Group Topic/Focus:  Wellness Toolbox:   The focus of this group is to discuss various aspects of wellness, balancing those aspects and exploring ways to increase the ability to experience wellness.  Patients will create a wellness toolbox for use upon discharge.  Participation Level: Did Not Attend  Participation Quality:  Not Applicable  Affect:  Not Applicable  Cognitive:  Not Applicable  Insight:  Not Applicable  Engagement in Group: Not Applicable  Additional Comments:  Pt refused to attend group this morning.  Kaija Kovacevic E 04/15/2017, 10:51 AM

## 2017-04-15 NOTE — Progress Notes (Signed)
Data. Patient denies SI/HI/AVH. Verbally contracts for safety on the unit and to come to staff before acting of any self harm thoughts/feelings.  Patient was extremely irritable this morning. Patient refused to look at or talk to the nurse. He was able to answer questions by nodding, or shaking, his head. At one point he became so angry, he stepped back from the counter and just put his head down and balled his hands into fists and refused to answer anything else. He just took his meds and walked away. After lunch, patient came to nurse and apologized for his morning behavior and he was appropriate and his affect was bright. No his self assessment patient reports 6/10 for depression, 5/10 for hopelessness and 7/10 for anxiety. His goal for today is, "Get through it." Action. Emotional support and encouragement offered. Education provided on medication, indications and side effect. Q 15 minute checks done for safety. Response. Safety on the unit maintained through 15 minute checks.  Medications taken as prescribed. Attended some groups.

## 2017-04-15 NOTE — Tx Team (Signed)
Interdisciplinary Treatment and Diagnostic Plan Update  04/15/2017 Time of Session: 1829HB Rodney Watson MRN: [id number]    Principal Diagnosis: MDD (major depressive disorder), recurrent episode, severe ([place])  Secondary Diagnoses: Principal Problem:   MDD (major depressive disorder), recurrent episode, severe ([place]) Active Problems:   Alcohol abuse   Alcohol abuse with alcohol-induced mood disorder ([place])   Current Medications:  Current Facility-Administered Medications  Medication Dose Route Frequency Provider Last Rate Last Dose  . acamprosate (CAMPRAL) tablet 666 mg  666 mg Oral TID WC Money, Lowry Ram, FNP   666 mg at 04/15/17 0636  . alum & mag hydroxide-simeth (MAALOX/MYLANTA) 200-200-20 MG/5ML suspension 30 mL  30 mL Oral Q4H PRN Okonkwo, Justina A, NP      . chlordiazePOXIDE (LIBRIUM) capsule 25 mg  25 mg Oral Q6H PRN Money, Lowry Ram, FNP   25 mg at 04/13/17 8101  . feeding supplement (ENSURE ENLIVE) (ENSURE ENLIVE) liquid 237 mL  237 mL Oral BID PRN Cobos, Myer Peer, MD   237 mL at 04/14/17 1414  . FLUoxetine (PROZAC) capsule 10 mg  10 mg Oral Daily Okonkwo, Justina A, NP   10 mg at 04/15/17 0829  . gabapentin (NEURONTIN) capsule 300 mg  300 mg Oral TID Lu Duffel, Justina A, NP   300 mg at 04/15/17 0831  . hydrOXYzine (ATARAX/VISTARIL) tablet 25 mg  25 mg Oral Q6H PRN Money, Lowry Ram, FNP   25 mg at 04/14/17 2244  . loperamide (IMODIUM) capsule 2-4 mg  2-4 mg Oral PRN Money, Lowry Ram, FNP      . magnesium hydroxide (MILK OF MAGNESIA) suspension 30 mL  30 mL Oral Daily PRN Okonkwo, Justina A, NP      . mirtazapine (REMERON) tablet 15 mg  15 mg Oral QHS Okonkwo, Justina A, NP   15 mg at 04/14/17 2244  . multivitamin with minerals tablet 1 tablet  1 tablet Oral Daily Money, Lowry Ram, FNP   1 tablet at 04/15/17 [phone]  . nicotine polacrilex (NICORETTE) gum 2 mg  2 mg Oral PRN Cobos, Myer Peer, MD   2 mg at 04/14/17 2245  . ondansetron (ZOFRAN-ODT) disintegrating tablet 4 mg  4 mg Oral  Q6H PRN Money, Lowry Ram, FNP      . phenytoin (DILANTIN) ER capsule 100 mg  100 mg Oral TID Lu Duffel, Justina A, NP   100 mg at 04/15/17 0830  . thiamine (B-1) injection 100 mg  100 mg Intramuscular Once Money, Darnelle Maffucci B, FNP      . thiamine (VITAMIN B-1) tablet 100 mg  100 mg Oral Daily Money, Lowry Ram, FNP   100 mg at 04/15/17 2585  . traZODone (DESYREL) tablet 50 mg  50 mg Oral QHS PRN Cobos, Myer Peer, MD   50 mg at 04/14/17 2244  . triamcinolone cream (KENALOG) 0.1 % 1 application  1 application Topical TID Rozetta Nunnery, NP   1 application at 27/78/24 1410   PTA Medications: Medications Prior to Admission  Medication Sig Dispense Refill Last Dose  . FLUoxetine (PROZAC) 10 MG tablet Take 10 mg by mouth daily.     Marland Kitchen gabapentin (NEURONTIN) 300 MG capsule Take 1 capsule (300 mg total) by mouth 3 (three) times daily. 90 capsule 0   . mirtazapine (REMERON) 15 MG tablet Take 15 mg by mouth at bedtime.     . phenytoin (DILANTIN) 100 MG ER capsule Take 1 capsule (100 mg total) by mouth 3 (three) times daily. 90 capsule 0   .  traZODone (DESYREL) 50 MG tablet Take 50 mg by mouth at bedtime.     . triamcinolone cream (KENALOG) 0.1 % Apply 1 application topically 3 (three) times daily.       Patient Stressors: Financial difficulties Substance abuse  Patient Strengths: Capable of independent living Curator fund of knowledge  Treatment Modalities: Medication Management, Group therapy, Case management,  1 to 1 session with clinician, Psychoeducation, Recreational therapy.   Physician Treatment Plan for Primary Diagnosis: MDD (major depressive disorder), recurrent episode, severe ([place]) Long Term Goal(s): Improvement in symptoms so as ready for discharge Improvement in symptoms so as ready for discharge   Short Term Goals: Ability to disclose and discuss suicidal ideas Ability to demonstrate self-control will improve Ability to identify and develop effective coping  behaviors will improve Compliance with prescribed medications will improve Ability to identify triggers associated with substance abuse/mental health issues will improve  Medication Management: Evaluate patient's response, side effects, and tolerance of medication regimen.  Therapeutic Interventions: 1 to 1 sessions, Unit Group sessions and Medication administration.  Evaluation of Outcomes: Not Met  Physician Treatment Plan for Secondary Diagnosis: Principal Problem:   MDD (major depressive disorder), recurrent episode, severe ([place]) Active Problems:   Alcohol abuse   Alcohol abuse with alcohol-induced mood disorder ([place])  Long Term Goal(s): Improvement in symptoms so as ready for discharge Improvement in symptoms so as ready for discharge   Short Term Goals: Ability to disclose and discuss suicidal ideas Ability to demonstrate self-control will improve Ability to identify and develop effective coping behaviors will improve Compliance with prescribed medications will improve Ability to identify triggers associated with substance abuse/mental health issues will improve     Medication Management: Evaluate patient's response, side effects, and tolerance of medication regimen.  Therapeutic Interventions: 1 to 1 sessions, Unit Group sessions and Medication administration.  Evaluation of Outcomes: Not Met   RN Treatment Plan for Primary Diagnosis: MDD (major depressive disorder), recurrent episode, severe ([place]) Long Term Goal(s): Knowledge of disease and therapeutic regimen to maintain health will improve  Short Term Goals: Ability to remain free from injury will improve, Ability to disclose and discuss suicidal ideas and Ability to identify and develop effective coping behaviors will improve  Medication Management: RN will administer medications as ordered by provider, will assess and evaluate patient's response and provide education to patient for prescribed medication. RN will  report any adverse and/or side effects to prescribing provider.  Therapeutic Interventions: 1 on 1 counseling sessions, Psychoeducation, Medication administration, Evaluate responses to treatment, Monitor vital signs and CBGs as ordered, Perform/monitor CIWA, COWS, AIMS and Fall Risk screenings as ordered, Perform wound care treatments as ordered.  Evaluation of Outcomes: Not Met   LCSW Treatment Plan for Primary Diagnosis: MDD (major depressive disorder), recurrent episode, severe ([place]) Long Term Goal(s): Safe transition to appropriate next level of care at discharge, Engage patient in therapeutic group addressing interpersonal concerns.  Short Term Goals: Engage patient in aftercare planning with referrals and resources, Facilitate patient progression through stages of change regarding substance use diagnoses and concerns and Identify triggers associated with mental health/substance abuse issues  Therapeutic Interventions: Assess for all discharge needs, 1 to 1 time with Social worker, Explore available resources and support systems, Assess for adequacy in community support network, Educate family and significant other(s) on suicide prevention, Complete Psychosocial Assessment, Interpersonal group therapy.  Evaluation of Outcomes: Not Met  Progress in Treatment: Attending groups: No. Participating in groups: No. Taking medication as prescribed: Yes.  Toleration medication: Yes. Family/Significant other contact made: No, will contact:  family member if pt consents. Patient understands diagnosis: Yes. Discussing patient identified problems/goals with staff: Yes. Medical problems stabilized or resolved: Yes. Denies suicidal/homicidal ideation: No. Passive SI/Able to contract for safety.  Issues/concerns per patient self-inventory: No. Other: n/a   New problem(s) identified: No, Describe:  n/a  New Short Term/Long Term Goal(s): detox, medication management for mood stabilization;  elimination of SI thoughts; development of comprehensive mental wellness/sobriety plan.   Patient Goal: "to focus on getting off alcohol and staying on the campral. To work on getting better sleep at night."  Discharge Plan or Barriers: CSW assessing. Pt is refusing residential treatment despite coming to [facility] for detox twice within a few weeks. Pt is homeless and would like to discharge back to the street and attend [facility] for outpatient mental health care. AA/NA list and [place] pamphlet provided to pt for additional community supports.   Reason for Continuation of Hospitalization: Anxiety Depression Medication stabilization Suicidal ideation Withdrawal symptoms  Estimated Length of Stay: Tuesday, 04/16/17  Attendees: Patient: Rodney Watson 04/15/2017 9:18 AM  Physician: Dr. Parke Poisson MD 04/15/2017 9:18 AM  Nursing: Opal Sidles RN 04/15/2017 9:18 AM  RN Care Manager: Lars Pinks CM 04/15/2017 9:18 AM  Social Worker: Maxie Better, LCSW; Taelon Bendorf LCSWA 04/15/2017 9:18 AM  Recreational Therapist: Rhunette Croft  04/15/2017 9:18 AM  Other: Lindell Spar NP; Marvia Pickles NP; Ricky Ala NP 04/15/2017 9:18 AM  Other:  04/15/2017 9:18 AM  Other: 04/15/2017 9:18 AM    Scribe for Treatment Team: Kimber Relic Smart, LCSW 04/15/2017 9:18 AM

## 2017-04-15 NOTE — Progress Notes (Addendum)
[facility] Psychiatric MD Progress Note  04/15/2017 12:52 PM Wiliam Cauthorn  MRN:  [id number]   Subjective:  Patient reports that he did not sleep well and is requesting a higher dose of Trazodone. He denies any SI/HI/AVH and contracts for safety. He reports he is having some withdrawals and he doesn't feel good today. He denies any medication side effects. He reports still feeling depressed and denies any anxiety. He is still refusing any rehab or residential treatment and just wants outpatient services.    Objective: Patient's chart and findings reviewed and discussed with treatment team. Patient presents in his bed and is completely covered by his blankets. He is easily awakened. He is pleasant and cooperative. He does have some minor tremors. Will increase the Trazodone 100 mg QHS PRN for sleep and continue all other medications.   Principal Problem: MDD (major depressive disorder), recurrent episode, severe (HCC) Diagnosis:   Patient Active Problem List   Diagnosis Date Noted  . MDD (major depressive disorder), recurrent episode, severe (HCC) [F33.2] 04/13/2017  . Alcohol abuse with alcohol-induced mood disorder (HCC) [F10.14] 04/10/2017  . Alcohol abuse [F10.10] 04/01/2017   Total Time spent with patient: 30 minutes  Past Psychiatric History: See H&P  Past Medical History:  Past Medical History:  Diagnosis Date  . Alcohol abuse   . COPD (chronic obstructive pulmonary disease) (HCC)   . Depression   . ETOH abuse   . Seizures (HCC)    History reviewed. No pertinent surgical history. Family History: History reviewed. No pertinent family history. Family Psychiatric  History: See H&P Social History:  Social History   Substance and Sexual Activity  Alcohol Use Yes   Comment: 5th daily     Social History   Substance and Sexual Activity  Drug Use Yes  . Types: Marijuana   Comment: 1-2 joints/week    Social History   Socioeconomic History  . Marital status: Single    Spouse name: None  .  Number of children: None  . Years of education: None  . Highest education level: None  Social Needs  . Financial resource strain: None  . Food insecurity - worry: None  . Food insecurity - inability: None  . Transportation needs - medical: None  . Transportation needs - non-medical: None  Occupational History  . None  Tobacco Use  . Smoking status: Current Every Day Smoker    Packs/day: 1.50    Years: 39.00    Pack years: 58.50    Types: Cigarettes  . Smokeless tobacco: Current User    Types: Chew  Substance and Sexual Activity  . Alcohol use: Yes    Comment: 5th daily  . Drug use: Yes    Types: Marijuana    Comment: 1-2 joints/week  . Sexual activity: No  Other Topics Concern  . None  Social History Narrative   ** Merged History Encounter **       Additional Social History:                         Sleep: Fair  Appetite:  Good  Current Medications: Current Facility-Administered Medications  Medication Dose Route Frequency Provider Last Rate Last Dose  . acamprosate (CAMPRAL) tablet 666 mg  666 mg Oral TID WC Money, Gerlene Burdock, FNP   666 mg at 04/15/17 0636  . alum & mag hydroxide-simeth (MAALOX/MYLANTA) 200-200-20 MG/5ML suspension 30 mL  30 mL Oral Q4H PRN Okonkwo, Justina A, NP      .  chlordiazePOXIDE (LIBRIUM) capsule 25 mg  25 mg Oral Q6H PRN Money, Gerlene Burdockravis B, FNP   25 mg at 04/13/17 [id number]  . feeding supplement (ENSURE ENLIVE) (ENSURE ENLIVE) liquid 237 mL  237 mL Oral BID PRN Tacori Kvamme, Rockey SituFernando A, MD   237 mL at 04/14/17 1414  . FLUoxetine (PROZAC) capsule 10 mg  10 mg Oral Daily Okonkwo, Justina A, NP   10 mg at 04/15/17 0829  . gabapentin (NEURONTIN) capsule 300 mg  300 mg Oral TID Beryle Lathekonkwo, Justina A, NP   300 mg at 04/15/17 0831  . hydrOXYzine (ATARAX/VISTARIL) tablet 25 mg  25 mg Oral Q6H PRN Money, Gerlene Burdockravis B, FNP   25 mg at 04/14/17 2244  . loperamide (IMODIUM) capsule 2-4 mg  2-4 mg Oral PRN Money, Gerlene Burdockravis B, FNP      . magnesium hydroxide (MILK OF  MAGNESIA) suspension 30 mL  30 mL Oral Daily PRN Okonkwo, Justina A, NP      . mirtazapine (REMERON) tablet 15 mg  15 mg Oral QHS Okonkwo, Justina A, NP   15 mg at 04/14/17 2244  . multivitamin with minerals tablet 1 tablet  1 tablet Oral Daily Money, Gerlene Burdockravis B, FNP   1 tablet at 04/15/17 [phone]  . nicotine polacrilex (NICORETTE) gum 2 mg  2 mg Oral PRN Suzie Vandam, Rockey SituFernando A, MD   2 mg at 04/14/17 2245  . ondansetron (ZOFRAN-ODT) disintegrating tablet 4 mg  4 mg Oral Q6H PRN Money, Gerlene Burdockravis B, FNP      . phenytoin (DILANTIN) ER capsule 100 mg  100 mg Oral TID Beryle Lathekonkwo, Justina A, NP   100 mg at 04/15/17 0830  . thiamine (B-1) injection 100 mg  100 mg Intramuscular Once Money, Feliz Beamravis B, FNP      . thiamine (VITAMIN B-1) tablet 100 mg  100 mg Oral Daily Money, Gerlene Burdockravis B, FNP   100 mg at 04/15/17 [id number]  . traZODone (DESYREL) tablet 100 mg  100 mg Oral QHS PRN Money, Gerlene Burdockravis B, FNP      . triamcinolone cream (KENALOG) 0.1 % 1 application  1 application Topical TID Jackelyn PolingBerry, Jason A, NP   1 application at 04/14/17 1410    Lab Results:  Results for orders placed or performed during the hospital encounter of 04/13/17 (from the past 48 hour(s))  Phenytoin level, total     Status: Abnormal   Collection Time: 04/15/17  6:24 AM  Result Value Ref Range   Phenytoin Lvl 5.8 (L) 10.0 - 20.0 ug/mL    Comment: Performed at [facility], [address] [place], [place] [address]    Blood Alcohol level:  Lab Results  Component Value Date   ETH 225 (H) 04/12/2017   ETH 351 (HH) 04/11/2017    Metabolic Disorder Labs: No results found for: HGBA1C, MPG No results found for: PROLACTIN No results found for: CHOL, TRIG, HDL, CHOLHDL, VLDL, LDLCALC  Physical Findings: AIMS: Facial and Oral Movements Muscles of Facial Expression: None, normal Lips and Perioral Area: None, normal Jaw: None, normal Tongue: None, normal,Extremity Movements Upper (arms, wrists, hands, fingers): None, normal Lower (legs,  knees, ankles, toes): None, normal, Trunk Movements Neck, shoulders, hips: None, normal, Overall Severity Severity of abnormal movements (highest score from questions above): None, normal Incapacitation due to abnormal movements: None, normal Patient's awareness of abnormal movements (rate only patient's report): No Awareness, Dental Status Current problems with teeth and/or dentures?: No Does patient usually wear dentures?: No  CIWA:  CIWA-Ar Total: 2 COWS:  Musculoskeletal: Strength & Muscle Tone: within normal limits Gait & Station: normal Patient leans: N/A  Psychiatric Specialty Exam: Physical Exam  Nursing note and vitals reviewed. Constitutional: He is oriented to person, place, and time. He appears well-developed and well-nourished.  Cardiovascular: Normal rate.  Respiratory: Effort normal.  Musculoskeletal: Normal range of motion.  Neurological: He is alert and oriented to person, place, and time.  Skin: Skin is warm.    Review of Systems  Constitutional: Negative.   HENT: Negative.   Eyes: Negative.   Respiratory: Negative.   Cardiovascular: Negative.   Genitourinary: Negative.   Musculoskeletal: Negative.   Skin: Negative.   Neurological: Positive for tremors.  Endo/Heme/Allergies: Negative.   Psychiatric/Behavioral: Positive for depression and substance abuse. Negative for hallucinations and suicidal ideas. The patient is nervous/anxious and has insomnia.     Blood pressure (!) 141/95, pulse 89, temperature 98.2 F (36.8 C), temperature source Oral, resp. rate 16, height 5' 5.5" (1.664 m), weight 61 kg (134 lb 8 oz), SpO2 98 %.Body mass index is 22.04 kg/m.  General Appearance: Casual  Eye Contact:  Good  Speech:  Clear and Coherent and Normal Rate  Volume:  Normal  Mood:  Depressed  Affect:  Flat  Thought Process:  Goal Directed and Descriptions of Associations: Intact  Orientation:  Full (Time, Place, and Person)  Thought Content:  WDL  Suicidal  Thoughts:  No  Homicidal Thoughts:  No  Memory:  Immediate;   Good Recent;   Good Remote;   Good  Judgement:  Good  Insight:  Good  Psychomotor Activity:  Normal  Concentration:  Concentration: Good  Recall:  Good  Fund of Knowledge:  Good  Language:  Good  Akathisia:  No  Handed:  Right  AIMS (if indicated):     Assets:  Communication Skills Desire for Improvement Social Support  ADL's:  Intact  Cognition:  WNL  Sleep:  Number of Hours: 4.75   Problems Addressed: Alcohol abuse  MDD severe Alcohol induced mood disorder  Treatment Plan Summary: Daily contact with patient to assess and evaluate symptoms and progress in treatment, Medication management and Plan is to:  -Continue Librium 25 mg PRN for CIWA >10 -Continue Prozac 10 mg PO Daily for mood stability -Continue Dilantin 100 mg TID for seizures -Increase Trazodone 100 mg PO QHS PRN for sleep -Continue Gabapentin 300 mg PO TID for withdrawal symptoms -Continue Vistaril 25 mg Q6H PRN for anxiety -Encourage group therapy participation  Maryfrances Bunnell, FNP 04/15/2017, 12:52 PM   Agree with NP Progress Note

## 2017-04-16 MED ORDER — LISINOPRIL 20 MG PO TABS
20.0000 mg | ORAL_TABLET | Freq: Once | ORAL | Status: AC
  Administered 2017-04-16: 20 mg via ORAL
  Filled 2017-04-16 (×2): qty 1

## 2017-04-16 MED ORDER — TRAZODONE HCL 100 MG PO TABS: 100.0000 mg | ORAL_TABLET | Freq: Every evening | ORAL | 0 refills | Status: DC | PRN

## 2017-04-16 MED ORDER — HYDROXYZINE HCL 25 MG PO TABS: ORAL_TABLET | ORAL | 0 refills | Status: DC

## 2017-04-16 MED ORDER — MIRTAZAPINE 15 MG PO TABS: 15.0000 mg | ORAL_TABLET | Freq: Every day | ORAL | 0 refills | Status: DC

## 2017-04-16 MED ORDER — ACAMPROSATE CALCIUM 333 MG PO TBEC: 666.0000 mg | DELAYED_RELEASE_TABLET | Freq: Three times a day (TID) | ORAL | 0 refills | Status: DC

## 2017-04-16 MED ORDER — PHENYTOIN SODIUM EXTENDED 100 MG PO CAPS: 100.0000 mg | ORAL_CAPSULE | Freq: Three times a day (TID) | ORAL | 0 refills | Status: DC

## 2017-04-16 MED ORDER — LISINOPRIL 10 MG PO TABS
10.0000 mg | ORAL_TABLET | Freq: Every day | ORAL | Status: DC
  Filled 2017-04-16 (×2): qty 1

## 2017-04-16 MED ORDER — GABAPENTIN 300 MG PO CAPS: 300.0000 mg | ORAL_CAPSULE | Freq: Three times a day (TID) | ORAL | 0 refills | Status: DC

## 2017-04-16 MED ORDER — FLUOXETINE HCL 10 MG PO CAPS: 10.0000 mg | ORAL_CAPSULE | Freq: Every day | ORAL | 0 refills | Status: DC

## 2017-04-16 MED ORDER — NICOTINE POLACRILEX 2 MG MT GUM: 2.0000 mg | CHEWING_GUM | OROMUCOSAL | 0 refills | Status: DC | PRN

## 2017-04-16 MED ORDER — TRIAMCINOLONE ACETONIDE 0.1 % EX CREA: 1.0000 "application " | TOPICAL_CREAM | Freq: Three times a day (TID) | CUTANEOUS | 0 refills | Status: DC

## 2017-04-16 MED ORDER — LISINOPRIL 10 MG PO TABS: 10.0000 mg | ORAL_TABLET | Freq: Every day | ORAL | 0 refills | Status: DC

## 2017-04-16 NOTE — Progress Notes (Signed)
Patient ID: Rodney Watson, male   DOB: [birth year], 52 y.o.   MRN: [id number]  NP N[place] is aware of patient's trending BP.

## 2017-04-16 NOTE — [facility] Suicide Risk Assessment (Signed)
[facility] Discharge Suicide Risk Assessment   Principal Problem: MDD (major depressive disorder), recurrent episode, severe ([place]) Discharge Diagnoses:  Patient Active Problem List   Diagnosis Date Noted  . MDD (major depressive disorder), recurrent episode, severe ([place]) [F33.2] 04/13/2017  . Alcohol abuse with alcohol-induced mood disorder ([place]) [F10.14] 04/10/2017  . Alcohol abuse [F10.10] 04/01/2017    Total Time spent with patient: 30 minutes  Musculoskeletal: Strength & Muscle Tone: within normal limits Gait & Station: normal Patient leans: N/A  Psychiatric Specialty Exam: Review of Systems  Constitutional: Negative for chills and fever.  Respiratory: Negative for cough and shortness of breath.   Cardiovascular: Negative for chest pain.  Gastrointestinal: Negative for abdominal pain, heartburn, nausea and vomiting.  Psychiatric/Behavioral: Negative for depression, hallucinations, substance abuse and suicidal ideas.    Blood pressure (!) 133/91, pulse 81, temperature 97.6 F (36.4 C), temperature source Oral, resp. rate 16, height 5' 5.5" (1.664 m), weight 61 kg (134 lb 8 oz), SpO2 98 %.Body mass index is 22.04 kg/m.  General Appearance: Casual and Fairly Groomed  Engineer, water::  Good  Speech:  Clear and Coherent and Normal Rate  Volume:  Normal  Mood:  Euthymic  Affect:  Appropriate and Congruent  Thought Process:  Coherent and Goal Directed  Orientation:  Full (Time, Place, and Person)  Thought Content:  Logical  Suicidal Thoughts:  No  Homicidal Thoughts:  No  Memory:  Immediate;   Fair Recent;   Fair Remote;   Fair  Judgement:  Fair  Insight:  Fair  Psychomotor Activity:  Normal  Concentration:  Fair  Recall:  Good  Fund of [place]  Language: Fair  Akathisia:  No  Handed:    AIMS (if indicated):     Assets:  Communication Skills Leisure Time Physical Health Resilience  Sleep:  Number of Hours: 5.25  Cognition: WNL  ADL's:  Intact   Mental Status  Per Nursing Assessment::   On Admission:  Suicidal ideation indicated by patient  Demographic Factors:  Male, Low socioeconomic status, Living alone and Unemployed  Loss Factors: Financial problems/change in socioeconomic status  Historical Factors: Family history of mental illness or substance abuse and Impulsivity  Risk Reduction Factors:   Positive social support, Positive therapeutic relationship and Positive coping skills or problem solving skills  Continued Clinical Symptoms:  Depression:   Severe  Cognitive Features That Contribute To Risk:  None    Suicide Risk:  Minimal: No identifiable suicidal ideation.  Patients presenting with no risk factors but with morbid ruminations; may be classified as minimal risk based on the severity of the depressive symptoms  Follow-up Information    [facility] Follow up.   Why:  Walk-In Clinic is open at 8am Monday-Friday.  You should arrive as early as possible to receive an assessment and be started in medication management. Contact information: [place] [address] [phone]         Subjective Data:  Rodney Watson is a 52 y/o M with history of MDD and alcohol use disorder who was admitted with worsening depression and alcohol use. Pt was placed on alcohol detox protocol and restarted on his previous home medications. He reported gradual improvement of his mood symptoms. He met with SW team to discuss options for treatment after discharge.  Today upon evaluation, pt shares, "Unfortunately, I've been chronically homeless and I ran into the wrong crowd again and started drinking." Pt reports that since his admission, he has improved insight to what his  typical triggers are, and he plans to avoid associating with the people that tend to supply him with alcohol and encourage his drinking. Pt reports that after he has detoxed, he is feeling better today. He is tolerating his medications without difficulty. His appetite is  good. He denies SI/HI/AH/VH. He is in agreement to continue his current treatment regimen without changes. He was able to engage in safety planning including plan to return to [facility] or contact emergency services if he feels unable to maintain his own safety or the safety of others. Pt had no further questions, comments, or concerns.   Plan Of Care/Follow-up recommendations:   - Discharge to outpatient level of care  -MDD recurrent, severe, without psychotic features   - Continue prozac 22m po qDay   - Continue remeron 13mpo qhs  -Anxiety   - Continue atarax 2567mo q6h prn anxiety  - Seizure disorder   - Continue dilantin ER 100m25mD   Activity:  as tolerated Diet:  normal Tests:  NA Other:  see above for DC plan  [name] 04/16/2017, 10:50 AM

## 2017-04-16 NOTE — Discharge Summary (Addendum)
Physician Discharge Summary Note  Patient:  Rodney Watson is an 52 y.o., male  MRN:  [id number]  DOB:  1965/04/17  Patient phone:  [phone] (home)   Patient address:   [place] [address],   Total Time spent with patient: Greater than 30 minutes  Date of Admission:  04/13/2017  Date of Discharge: 04/16/2017  Reason for Admission: Worsening alcohol use & depressive symptoms.  Principal Problem: MDD (major depressive disorder), recurrent episode, severe [facility])  Discharge Diagnoses: Patient Active Problem List   Diagnosis Date Noted  . MDD (major depressive disorder), recurrent episode, severe ([place]) [F33.2] 04/13/2017  . Alcohol abuse with alcohol-induced mood disorder ([place]) [F10.14] 04/10/2017  . Alcohol abuse [F10.10] 04/01/2017   Past Psychiatric History: Major depression, alcohol abuse  Past Medical History:  Past Medical History:  Diagnosis Date  . Alcohol abuse   . COPD (chronic obstructive pulmonary disease) ([place])   . Depression   . ETOH abuse   . Seizures ([place])    History reviewed. No pertinent surgical history.  Family History: History reviewed. No pertinent family history.  Family Psychiatric  History: See H&P  Social History:  Social History   Substance and Sexual Activity  Alcohol Use Yes   Comment: 5th daily     Social History   Substance and Sexual Activity  Drug Use Yes  . Types: Marijuana   Comment: 1-2 joints/week    Social History   Socioeconomic History  . Marital status: Single    Spouse name: None  . Number of children: None  . Years of education: None  . Highest education level: None  Social Needs  . Financial resource strain: None  . Food insecurity - worry: None  . Food insecurity - inability: None  . Transportation needs - medical: None  . Transportation needs - non-medical: None  Occupational History  . None  Tobacco Use  . Smoking status: Current Every Day Smoker    Packs/day: 1.50    Years: 39.00    Pack  years: 58.50    Types: Cigarettes  . Smokeless tobacco: Current User    Types: Chew  Substance and Sexual Activity  . Alcohol use: Yes    Comment: 5th daily  . Drug use: Yes    Types: Marijuana    Comment: 1-2 joints/week  . Sexual activity: No  Other Topics Concern  . None  Social History Narrative   ** Merged History [facility] Course: (Per Md's SRA): Rodney Watson is a 52 y/o M with history of MDD and alcohol use disorder who was admitted with worsening depression and alcohol use. Pt was placed on alcohol detox protocol and restarted on his previous home medications. He reported gradual improvement of his mood symptoms. He met with SW team to discuss options for treatment after discharge.  After the admission assessment, It was determined that Rodney Watson will need mood stabilization treatments. The medication regimen targeting those presenting symptoms were dicussed & initiated. He was medicated & discharged on; Campral 666 mg for alcoholism, Prozac10 mg for depression, Gabapentin 300 mg for agitation, Hydroxyzine 25 mg prn for anxiety, Mirtazapine 15 mg for insomnia, Nicorette gum 2 mg for smoking cessation & Trazodone 100 mg for insomnia. He was also enrolled & participated in the group counseling sessions being offered & held on this unit. He learned coping skills that should help him after discharge to cope better to maintain mood stability/sobriety. He also received other medication regimen  for the other medical issues presented. He tolerated his treatment regimen without any adverse effects or reactions reported.  Today upon his discharge evaluation with the attending psychiatrist, pt shares, "Unfortunately, I've been chronically homeless and I ran into the wrong crowd again and started drinking." Pt reports that since his admission, he has improved insight to what his typical triggers are, and he plans to avoid associating with the people that tend to supply him with  alcohol and encourage his drinking. Pt reports that after he has detoxed, he is feeling better today. He is tolerating his medications without difficulty. His appetite is good. He denies SI/HI/AH/VH. He is in agreement to continue his current treatment regimen without changes. He was able to engage in safety planning including plan to return to [facility] or contact emergency services if he feels unable to maintain his own safety or the safety of others. Pt had no further questions, comments, or concerns.  Upon discharge, Rodney Watson presents mentally & medically stable. He will continue mental health care & substance abuse treatment on outpatient basis as noted below. He is provided with all the necessary information needed to make this appointment without problems. He left [facility] with all personal belongings in no apparent distress. Transportation per city bus. [place] assisted with bus pass.  Physical Findings: AIMS: Facial and Oral Movements Muscles of Facial Expression: None, normal Lips and Perioral Area: None, normal Jaw: None, normal Tongue: None, normal,Extremity Movements Upper (arms, wrists, hands, fingers): None, normal Lower (legs, knees, ankles, toes): None, normal, Trunk Movements Neck, shoulders, hips: None, normal, Overall Severity Severity of abnormal movements (highest score from questions above): None, normal Incapacitation due to abnormal movements: None, normal Patient's awareness of abnormal movements (rate only patient's report): No Awareness, Dental Status Current problems with teeth and/or dentures?: No Does patient usually wear dentures?: No  CIWA:  CIWA-Ar Total: 0 COWS:     Musculoskeletal: Strength & Muscle Tone: within normal limits Gait & Station: normal Patient leans: N/A  Psychiatric Specialty Exam: Physical Exam  Constitutional: He is oriented to person, place, and time. He appears well-developed and well-nourished.  HENT:  Head: Normocephalic.  Eyes: Pupils are  equal, round, and reactive to light.  Neck: Normal range of motion.  Cardiovascular:  Elevated blood pressure  Respiratory: Effort normal.  GI: Soft.  Genitourinary:  Genitourinary Comments: Deferred  Musculoskeletal: Normal range of motion.  Neurological: He is alert and oriented to person, place, and time.  Skin: Skin is warm.  Psychiatric: He has a normal mood and affect. His speech is normal and behavior is normal. Judgment and thought content normal. Cognition and memory are normal.    Review of Systems  Constitutional: Negative.   HENT: Negative.   Eyes: Negative.   Respiratory: Negative.   Cardiovascular: Negative.   Gastrointestinal: Negative.   Genitourinary: Negative.   Musculoskeletal: Negative.   Skin: Negative.   Neurological: Negative.   Endo/Heme/Allergies: Negative.   Psychiatric/Behavioral: Positive for depression (Stable) and substance abuse (Hx. alcoholism, chronic). Negative for hallucinations, memory loss and suicidal ideas. The patient has insomnia (Stable). The patient is not nervous/anxious.   All other systems reviewed and are negative.   Blood pressure (!) 146/98, pulse 79, temperature 97.6 F (36.4 C), temperature source Oral, resp. rate 16, height 5' 5.5" (1.664 m), weight 61 kg (134 lb 8 oz), SpO2 98 %.Body mass index is 22.04 kg/m.  See Md's SRA   Have you used any form of tobacco in the last 30 days? (Cigarettes,  Smokeless Tobacco, Cigars, and/or Pipes): Yes  Has this patient used any form of tobacco in the last 30 days? (Cigarettes, Smokeless Tobacco, Cigars, and/or Pipes):Yes, an FDA-approved tobacco cessation medication was offered at discharge.  Blood Alcohol level:  Lab Results  Component Value Date   ETH 225 (H) 04/12/2017   ETH 351 (HH) 36/62/9476   Metabolic Disorder Labs:  No results found for: HGBA1C, MPG No results found for: PROLACTIN No results found for: CHOL, TRIG, HDL, CHOLHDL, VLDL, LDLCALC  See Psychiatric Specialty Exam  and Suicide Risk Assessment completed by Attending Physician prior to discharge.  Discharge destination:  Home  Is patient on multiple antipsychotic therapies at discharge:  No   Has Patient had three or more failed trials of antipsychotic monotherapy by history:  No  Recommended Plan for Multiple Antipsychotic Therapies: NA  Allergies as of 04/16/2017      Reactions   Chocolate    Chocolate Hives   Vicodin [hydrocodone-acetaminophen] Nausea And Vomiting      Medication List    STOP taking these medications   FLUoxetine 10 MG tablet Commonly known as:  PROZAC Replaced by:  FLUoxetine 10 MG capsule     TAKE these medications     Indication  acamprosate 333 MG tablet Commonly known as:  CAMPRAL Take 2 tablets (666 mg total) by mouth 3 (three) times daily with meals. For alcoholism  Indication:  Excessive Use of Alcohol   FLUoxetine 10 MG capsule Commonly known as:  PROZAC Take 1 capsule (10 mg total) by mouth daily. For depression Start taking on:  04/17/2017 Replaces:  FLUoxetine 10 MG tablet  Indication:  Excessive Use of Alcohol   gabapentin 300 MG capsule Commonly known as:  NEURONTIN Take 1 capsule (300 mg total) by mouth 3 (three) times daily. For agitation What changed:  additional instructions  Indication:  Alcohol Withdrawal Syndrome, Agitation   hydrOXYzine 25 MG tablet Commonly known as:  ATARAX/VISTARIL Take 1 tablet (25 mg) by mouth four times daily as needed: For aniety  Indication:  Feeling Anxious   lisinopril 10 MG tablet Commonly known as:  PRINIVIL,ZESTRIL Take 1 tablet (10 mg total) by mouth daily. For high blood pressure Start taking on:  04/17/2017  Indication:  High Blood Pressure Disorder   mirtazapine 15 MG tablet Commonly known as:  REMERON Take 1 tablet (15 mg total) by mouth at bedtime. For depression/sleep What changed:  additional instructions  Indication:  Major Depressive Disorder, Insomnia   nicotine polacrilex 2 MG  gum Commonly known as:  NICORETTE Take 1 each (2 mg total) by mouth as needed for smoking cessation. (May purchase from over the counter): For smoking cessation  Indication:  Nicotine Addiction   phenytoin 100 MG ER capsule Commonly known as:  DILANTIN Take 1 capsule (100 mg total) by mouth 3 (three) times daily. For seizure activities What changed:  additional instructions  Indication:  Seizure   traZODone 100 MG tablet Commonly known as:  DESYREL Take 1 tablet (100 mg total) by mouth at bedtime as needed for sleep. What changed:    medication strength  how much to take  when to take this  reasons to take this  Indication:  Trouble Sleeping   triamcinolone cream 0.1 % Commonly known as:  KENALOG Apply 1 application topically 3 (three) times daily. For rashes What changed:  additional instructions  Indication:  Skin Inflammation      Follow-up Information    [facility] Follow up.   Why:  Walk-In Clinic is open at 8am Monday-Friday.  You should arrive as early as possible to receive an assessment and be started in medication management.CSW unable to schedule an appointment prior to discharge.   Contact information: [address] [place] [address] [phone]          Follow-up recommendations: Activity:  As tolerated Diet: As recommended by your primary care doctor. Keep all scheduled follow-up appointments as recommended.    Comments: Patient is instructed prior to discharge to: Take all medications as prescribed by his/her mental healthcare provider. Report any adverse effects and or reactions from the medicines to his/her outpatient provider promptly. Patient has been instructed & cautioned: To not engage in alcohol and or illegal drug use while on prescription medicines. In the event of worsening symptoms, patient is instructed to call the crisis hotline, 911 and or go to the nearest ED for appropriate evaluation and treatment of symptoms. To follow-up with  his/her primary care provider for your other medical issues, concerns and or health care needs.   Signed: Lindell Spar, NP, PMHNP, FNP-BC 04/16/2017, 12:56 PM   Patient seen, Suicide Assessment Completed.  Disposition Plan Reviewed

## 2017-04-16 NOTE — [facility] Suicide Risk Assessment (Signed)
[facility] INPATIENT:  Family/Significant Other Suicide Prevention Education  Suicide Prevention Education:   SPE completed with patient, as patient refused to consent to family contact. SPI pamphlet provided to pt and pt was encouraged to share information with support network, ask questions, and talk about any concerns relating to SPE. Patient denies access to guns/firearms and verbalized understanding of information provided. Mobile Crisis information also provided to patient.   

## 2017-04-16 NOTE — Progress Notes (Signed)
Patient ID: Rodney Watson, male   DOB: 1965/04/16, 52 y.o.   MRN: [id number]  DAR: Pt. Denies SI/HI and A/V Hallucinations. He reports that his sleep last night was fair, his appetite is fair, his energy level is normal, and his concentration is good. He rates his depression and anxiety level at 5/10. Patient does not report any pain or discomfort at this time. Support and encouragement provided to the patient. Scheduled medications administered to patient per physician's orders. Patient is receptive and cooperative. He reports that he feels ready for discharge soon. Q15 minute checks are maintained for safety.

## 2017-04-16 NOTE — Progress Notes (Signed)
  [facility] :  Will you be returning to the same living situation after discharge:  Yes,  patient is homeless and plans to return to his previous living situation.  At discharge, do you have transportation home?: Yes,  bus passes Do you have the ability to pay for your medications: No.  Release of information consent forms completed and in the chart;  Patient's signature needed at discharge.  Patient to Follow up at: Follow-up Information    [facility] Follow up.   Why:  Walk-In Clinic is open at 8am Monday-Friday.  You should arrive as early as possible to receive an assessment and be started in medication management.CSW unable to schedule an appointment prior to discharge.   Contact information: [address] [place] [address] [phone]           Next level of care provider [facility]:yes  Safety Planning and Suicide Prevention discussed: Yes,  with the patient. Patient refused consent for any collateral contacts.   Have you used any form of tobacco in the last 30 days? (Cigarettes, Smokeless Tobacco, Cigars, and/or Pipes): Yes  Has patient been referred to the Quitline?: Patient refused referral  Patient has been referred for addiction treatment: Pt. refused referral    Maeola SarahJolan E Harm Jou, LCSWA 04/16/2017, 11:42 AM

## 2017-04-16 NOTE — Progress Notes (Signed)
Patient ID: Rodney Watson, male   DOB: 1965-06-30, 52 y.o.   MRN: [id number]  Discharge Note: Belongings returned to patient at time of discharge. Discharge instructions and medications were reviewed with patient. Patient verbalized understanding of both medications and discharge instructions. Patient discharged to lobby with 2 bus passes. She is in no distress. Q15 minute safety checks maintained until discharge.

## 2017-05-11 ENCOUNTER — Emergency Department (HOSPITAL_COMMUNITY): Payer: Medicaid Other

## 2017-05-11 ENCOUNTER — Emergency Department (HOSPITAL_COMMUNITY)
Admission: EM | Admit: 2017-05-11 | Discharge: 2017-05-12 | Disposition: A | Discharge status: Home / Self Care | Payer: Medicaid Other | Attending: Emergency Medicine | Admitting: Emergency Medicine

## 2017-05-11 ENCOUNTER — Other Ambulatory Visit: Payer: Self-pay

## 2017-05-11 ENCOUNTER — Encounter (HOSPITAL_COMMUNITY): Payer: Self-pay | Admitting: Behavioral Health

## 2017-05-11 DIAGNOSIS — F17210 Nicotine dependence, cigarettes, uncomplicated: Secondary | ICD-10-CM | POA: Insufficient documentation

## 2017-05-11 DIAGNOSIS — F10920 Alcohol use, unspecified with intoxication, uncomplicated: Secondary | ICD-10-CM

## 2017-05-11 DIAGNOSIS — Z046 Encounter for general psychiatric examination, requested by authority: Secondary | ICD-10-CM | POA: Diagnosis not present

## 2017-05-11 DIAGNOSIS — R45851 Suicidal ideations: Secondary | ICD-10-CM | POA: Diagnosis not present

## 2017-05-11 DIAGNOSIS — Z590 Homelessness: Secondary | ICD-10-CM | POA: Diagnosis not present

## 2017-05-11 DIAGNOSIS — W19XXXA Unspecified fall, initial encounter: Secondary | ICD-10-CM | POA: Diagnosis not present

## 2017-05-11 DIAGNOSIS — F329 Major depressive disorder, single episode, unspecified: Secondary | ICD-10-CM | POA: Insufficient documentation

## 2017-05-11 DIAGNOSIS — R51 Headache: Secondary | ICD-10-CM | POA: Insufficient documentation

## 2017-05-11 DIAGNOSIS — F1014 Alcohol abuse with alcohol-induced mood disorder: Secondary | ICD-10-CM | POA: Diagnosis present

## 2017-05-11 DIAGNOSIS — Y939 Activity, unspecified: Secondary | ICD-10-CM | POA: Insufficient documentation

## 2017-05-11 DIAGNOSIS — R569 Unspecified convulsions: Secondary | ICD-10-CM | POA: Insufficient documentation

## 2017-05-11 DIAGNOSIS — J449 Chronic obstructive pulmonary disease, unspecified: Secondary | ICD-10-CM | POA: Diagnosis not present

## 2017-05-11 DIAGNOSIS — Y999 Unspecified external cause status: Secondary | ICD-10-CM | POA: Insufficient documentation

## 2017-05-11 DIAGNOSIS — Z79899 Other long term (current) drug therapy: Secondary | ICD-10-CM | POA: Diagnosis not present

## 2017-05-11 DIAGNOSIS — Y9285 Railroad track as the place of occurrence of the external cause: Secondary | ICD-10-CM | POA: Diagnosis not present

## 2017-05-11 LAB — COMPREHENSIVE METABOLIC PANEL
ALBUMIN: 3.8 g/dL (ref 3.5–5.0)
ALT: 16 U/L — ABNORMAL LOW (ref 17–63)
AST: 35 U/L (ref 15–41)
Alkaline Phosphatase: 67 U/L (ref 38–126)
Anion gap: 12 (ref 5–15)
BUN: 5 mg/dL — ABNORMAL LOW (ref 6–20)
CHLORIDE: 105 mmol/L (ref 101–111)
CO2: 26 mmol/L (ref 22–32)
Calcium: 8.3 mg/dL — ABNORMAL LOW (ref 8.9–10.3)
Creatinine, Ser: 0.7 mg/dL (ref 0.61–1.24)
GFR calc Af Amer: >60 mL/min (ref >60)
Glucose, Bld: 103 mg/dL — ABNORMAL HIGH (ref 65–99)
POTASSIUM: 3.8 mmol/L (ref 3.5–5.1)
SODIUM: 143 mmol/L (ref 135–145)
Total Bilirubin: 0.7 mg/dL (ref 0.3–1.2)
Total Protein: 6.9 g/dL (ref 6.5–8.1)

## 2017-05-11 LAB — URINALYSIS, ROUTINE W REFLEX MICROSCOPIC
Bilirubin Urine: NEGATIVE
GLUCOSE, UA: NEGATIVE mg/dL
HGB URINE DIPSTICK: NEGATIVE
Ketones, ur: NEGATIVE mg/dL
Leukocytes, UA: NEGATIVE
Nitrite: NEGATIVE
PH: 7 (ref 5.0–8.0)
Protein, ur: NEGATIVE mg/dL
Specific Gravity, Urine: 1.002 — ABNORMAL LOW (ref 1.005–1.030)

## 2017-05-11 LAB — CBC WITH DIFFERENTIAL/PLATELET
BASOS ABS: 0.1 10*3/uL (ref 0.0–0.1)
BASOS PCT: 1 %
EOS ABS: 0.1 10*3/uL (ref 0.0–0.7)
EOS PCT: 1 %
HCT: 44.9 % (ref 39.0–52.0)
Hemoglobin: 15.1 g/dL (ref 13.0–17.0)
Lymphocytes Relative: 40 %
Lymphs Abs: 2 10*3/uL (ref 0.7–4.0)
MCH: 31.3 pg (ref 26.0–34.0)
MCHC: 33.6 g/dL (ref 30.0–36.0)
MCV: 93 fL (ref 78.0–100.0)
Monocytes Absolute: 0.5 10*3/uL (ref 0.1–1.0)
Monocytes Relative: 9 %
Neutro Abs: 2.5 10*3/uL (ref 1.7–7.7)
Neutrophils Relative %: 49 %
PLATELETS: 258 10*3/uL (ref 150–400)
RBC: 4.83 MIL/uL (ref 4.22–5.81)
RDW: 16.1 % — AB (ref 11.5–15.5)
WBC: 5.1 10*3/uL (ref 4.0–10.5)

## 2017-05-11 LAB — ACETAMINOPHEN LEVEL: Acetaminophen (Tylenol), Serum: <10 ug/mL — ABNORMAL LOW (ref 10–30)

## 2017-05-11 LAB — RAPID URINE DRUG SCREEN, HOSP PERFORMED
AMPHETAMINES: NOT DETECTED
BARBITURATES: NOT DETECTED
BENZODIAZEPINES: NOT DETECTED
Cocaine: NOT DETECTED
Opiates: NOT DETECTED
TETRAHYDROCANNABINOL: POSITIVE — AB

## 2017-05-11 LAB — ETHANOL: ALCOHOL ETHYL (B): 307 mg/dL — AB (ref <10)

## 2017-05-11 LAB — SALICYLATE LEVEL

## 2017-05-11 MED ORDER — LORAZEPAM 2 MG/ML IJ SOLN: 0.0000 mg | Freq: Two times a day (BID) | INTRAMUSCULAR | Status: DC

## 2017-05-11 MED ORDER — ACAMPROSATE CALCIUM 333 MG PO TBEC
666.0000 mg | DELAYED_RELEASE_TABLET | Freq: Three times a day (TID) | ORAL | Status: DC
  Administered 2017-05-12: 666 mg via ORAL
  Filled 2017-05-11 (×3): qty 2

## 2017-05-11 MED ORDER — SODIUM CHLORIDE 0.9 % IV BOLUS (SEPSIS)
1000.0000 mL | Freq: Once | INTRAVENOUS | Status: AC
  Administered 2017-05-11: 1000 mL via INTRAVENOUS

## 2017-05-11 MED ORDER — PHENYTOIN SODIUM EXTENDED 100 MG PO CAPS
100.0000 mg | ORAL_CAPSULE | Freq: Three times a day (TID) | ORAL | Status: DC
  Administered 2017-05-12: 100 mg via ORAL
  Filled 2017-05-11: qty 1

## 2017-05-11 MED ORDER — FLUOXETINE HCL 10 MG PO CAPS
10.0000 mg | ORAL_CAPSULE | Freq: Every day | ORAL | Status: DC
  Administered 2017-05-12: 10 mg via ORAL
  Filled 2017-05-11: qty 1

## 2017-05-11 MED ORDER — MIRTAZAPINE 30 MG PO TABS: 15.0000 mg | ORAL_TABLET | Freq: Every day | ORAL | Status: DC

## 2017-05-11 MED ORDER — LORAZEPAM 1 MG PO TABS: 0.0000 mg | ORAL_TABLET | Freq: Two times a day (BID) | ORAL | Status: DC

## 2017-05-11 MED ORDER — THIAMINE HCL 100 MG/ML IJ SOLN
100.0000 mg | Freq: Every day | INTRAMUSCULAR | Status: DC
  Administered 2017-05-11: 100 mg via INTRAVENOUS
  Filled 2017-05-11: qty 2

## 2017-05-11 MED ORDER — LORAZEPAM 1 MG PO TABS
0.0000 mg | ORAL_TABLET | Freq: Four times a day (QID) | ORAL | Status: DC
  Administered 2017-05-12: 1 mg via ORAL
  Filled 2017-05-11: qty 1

## 2017-05-11 MED ORDER — LORAZEPAM 2 MG/ML IJ SOLN: 0.0000 mg | Freq: Four times a day (QID) | INTRAMUSCULAR | Status: DC

## 2017-05-11 MED ORDER — NICOTINE POLACRILEX 2 MG MT GUM: 2.0000 mg | CHEWING_GUM | OROMUCOSAL | Status: DC | PRN

## 2017-05-11 MED ORDER — VITAMIN B-1 100 MG PO TABS
100.0000 mg | ORAL_TABLET | Freq: Every day | ORAL | Status: DC
  Administered 2017-05-12: 100 mg via ORAL
  Filled 2017-05-11: qty 1

## 2017-05-11 MED ORDER — LISINOPRIL 10 MG PO TABS
10.0000 mg | ORAL_TABLET | Freq: Every day | ORAL | Status: DC
  Administered 2017-05-12: 10 mg via ORAL
  Filled 2017-05-11: qty 1

## 2017-05-11 NOTE — ED Notes (Signed)
Bed: [id number] Expected date: 05/11/17 Expected time: 2:37 PM Means of arrival: Ambulance Comments: seizure
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
## 2017-05-11 NOTE — ED Triage Notes (Signed)
Pt bib EMS and is homeless and presents to the ED after the seizure.  Pt hx of seizures and reported to EMS that he drank "1 40's" today.  Pt smelled of ETOH for EMS. EMS did not note any post-ictal symptoms.

## 2017-05-11 NOTE — ED Provider Notes (Signed)
Medical screening examination/treatment/procedure(s) were conducted as a shared visit with non-physician practitioner(s) and myself.  I personally evaluated the patient during the encounter.   EKG Interpretation None     52 year old male here after having possible seizures.  No reported seizure activity per EMS.  Does admit to daily alcohol use.  He is sleeping but arousable here.  His alcohol level is over 300.  Head CT negative.  Will allow to metabolize until sober   Lorre NickAllen, Merril Isakson, MD 05/11/17 1810

## 2017-05-11 NOTE — ED Provider Notes (Signed)
[facility]-EMERGENCY DEPT Provider Note   CSN: [id number] Arrival date & time: 05/11/17  1435     History   Chief Complaint Chief Complaint  Patient presents with  . Seizures  . Alcohol Intoxication  . Suicidal    HPI Rodney Watson is a 52 y.o. male past medical history of alcohol abuse, depression who presents for evaluation of alcohol intoxication and possible seizure.  Per EMS, patient is homeless and reports a seizure that occurred today.  Patient states that he "drink too much today."he states he had previously told EMS that he drinks "one 40s today."  Patient states that he fell and hit his head on the railroad tracks. Patient reports suicidal ideations. Patient states that he was thinking of going to the railroad tracks and jumping in front of it. No HI, hallucinations.   EM LEVEL 5 CAVEAT: DUE TO PATIENT'S MENTAL STATUS  The history is provided by the EMS personnel.    Past Medical History:  Diagnosis Date  . Alcohol abuse   . COPD (chronic obstructive pulmonary disease) (HCC)   . Depression   . ETOH abuse   . Seizures [facility])     Patient Active Problem List   Diagnosis Date Noted  . MDD (major depressive disorder), recurrent episode, severe (HCC) 04/13/2017  . Alcohol abuse with alcohol-induced mood disorder (HCC) 04/10/2017  . Alcohol abuse 04/01/2017    History reviewed. No pertinent surgical history.      Home Medications    Prior to Admission medications   Medication Sig Start Date End Date Taking? Authorizing Provider  acamprosate (CAMPRAL) 333 MG tablet Take 2 tablets (666 mg total) by mouth 3 (three) times daily with meals. For alcoholism 04/16/17  Yes Armandina Stammer I, NP  FLUoxetine (PROZAC) 10 MG capsule Take 1 capsule (10 mg total) by mouth daily. For depression 04/17/17  Yes Nwoko, Nicole Kindred I, NP  gabapentin (NEURONTIN) 300 MG capsule Take 1 capsule (300 mg total) by mouth 3 (three) times daily. For agitation 04/16/17  Yes Armandina Stammer I, NP  hydrOXYzine (ATARAX/VISTARIL) 25 MG tablet Take 1 tablet (25 mg) by mouth four times daily as needed: For aniety 04/16/17  Yes Nwoko, Nicole Kindred I, NP  lisinopril (PRINIVIL,ZESTRIL) 10 MG tablet Take 1 tablet (10 mg total) by mouth daily. For high blood pressure 04/17/17  Yes Nwoko, Agnes I, NP  mirtazapine (REMERON) 15 MG tablet Take 1 tablet (15 mg total) by mouth at bedtime. For depression/sleep 04/16/17  Yes Armandina Stammer I, NP  nicotine polacrilex (NICORETTE) 2 MG gum Take 1 each (2 mg total) by mouth as needed for smoking cessation. (May purchase from over the counter): For smoking cessation 04/16/17  Yes Armandina Stammer I, NP  phenytoin (DILANTIN) 100 MG ER capsule Take 1 capsule (100 mg total) by mouth 3 (three) times daily. For seizure activities 04/16/17  Yes Armandina Stammer I, NP  traZODone (DESYREL) 100 MG tablet Take 1 tablet (100 mg total) by mouth at bedtime as needed for sleep. 04/16/17  Yes Armandina Stammer I, NP  triamcinolone cream (KENALOG) 0.1 % Apply 1 application topically 3 (three) times daily. For rashes 04/16/17  Yes Sanjuana Kava, NP    Family History History reviewed. No pertinent family history.  Social History Social History   Tobacco Use  . Smoking status: Current Every Day Smoker    Packs/day: 1.50    Years: 39.00    Pack years: 58.50    Types: Cigarettes  . Smokeless tobacco:  Current User    Types: Chew  Substance Use Topics  . Alcohol use: Yes    Comment: 5th daily  . Drug use: Yes    Types: Marijuana    Comment: 1-2 joints/week     Allergies   Chocolate; Chocolate; and Vicodin [hydrocodone-acetaminophen]   Review of Systems Review of Systems  Unable to perform ROS: Mental status change     Physical Exam Updated Vital Signs BP (!) 148/86   Pulse (!) 102   Temp 98.4 F (36.9 C) (Oral)   Resp 18   SpO2 99%   Physical Exam  Constitutional: He is oriented to person, place, and time. He appears well-developed and well-nourished.  HENT:  Head:  Normocephalic and atraumatic.    Mouth/Throat: Oropharynx is clear and moist and mucous membranes are normal.  No open wounds, abrasions or lacerations.   Eyes: Pupils are equal, round, and reactive to light. Conjunctivae, EOM and lids are normal.  PERRL bilaterally  Neck: Full passive range of motion without pain.  Cardiovascular: Normal rate, regular rhythm, normal heart sounds and normal pulses. Exam reveals no gallop and no friction rub.  No murmur heard. Pulmonary/Chest: Effort normal and breath sounds normal.  Abdominal: Soft. Normal appearance. There is no tenderness. There is no rigidity and no guarding.  Abdomen is soft, non-distended, non-tender.   Musculoskeletal: Normal range of motion.  Neurological: He is alert and oriented to person, place, and time.  Unable to assess neuro exam secondary to patient's refusal to cooperate   Skin: Skin is warm and dry. Capillary refill takes less than 2 seconds.  Psychiatric: He has a normal mood and affect. His speech is normal. He expresses suicidal ideation. He expresses no homicidal ideation. He expresses suicidal plans. He expresses no homicidal plans.  Nursing note and vitals reviewed.    ED Treatments / Results  Labs (all labs ordered are listed, but only abnormal results are displayed) Labs Reviewed  URINALYSIS, ROUTINE W REFLEX MICROSCOPIC - Abnormal; Notable for the following components:      Result Value   Color, Urine STRAW (*)    Specific Gravity, Urine 1.002 (*)    All other components within normal limits  RAPID URINE DRUG SCREEN, HOSP PERFORMED - Abnormal; Notable for the following components:   Tetrahydrocannabinol POSITIVE (*)    All other components within normal limits  ETHANOL - Abnormal; Notable for the following components:   Alcohol, Ethyl (B) 307 (*)    All other components within normal limits  CBC WITH DIFFERENTIAL/PLATELET - Abnormal; Notable for the following components:   RDW 16.1 (*)    All other  components within normal limits  COMPREHENSIVE METABOLIC PANEL - Abnormal; Notable for the following components:   Glucose, Bld 103 (*)    BUN 5 (*)    Calcium 8.3 (*)    ALT 16 (*)    All other components within normal limits  ACETAMINOPHEN LEVEL - Abnormal; Notable for the following components:   Acetaminophen (Tylenol), Serum <10 (*)    All other components within normal limits  SALICYLATE LEVEL    EKG None  Radiology Ct Head Wo Contrast  Result Date: 05/11/2017 CLINICAL DATA:  Seizure, ethanol intake, history ethanol abuse, COPD, smoker EXAM: CT HEAD WITHOUT CONTRAST TECHNIQUE: Contiguous axial images were obtained from the base of the skull through the vertex without intravenous contrast. Sagittal and coronal MPR images reconstructed from axial data set. COMPARISON:  03/30/2017 FINDINGS: Brain: Normal ventricular morphology. No midline shift or  mass effect. Normal appearance of brain parenchyma. No intracranial hemorrhage, mass lesion, evidence of acute infarction, or extra-axial fluid collection. Vascular: Normal appearance Skull: Intact Sinuses/Orbits: Clear Other: N/A IMPRESSION: Normal exam, unchanged. Electronically Signed   By: Ulyses Southward M.D.   On: 05/11/2017 16:50   Ct Cervical Spine Wo Contrast  Result Date: 05/11/2017 CLINICAL DATA:  Seizure EXAM: CT CERVICAL SPINE WITHOUT CONTRAST TECHNIQUE: Multidetector CT imaging of the cervical spine was performed without intravenous contrast. Multiplanar CT image reconstructions were also generated. COMPARISON:  03/30/2017 FINDINGS: Alignment: Cervical lordosis is maintained. Intact craniocervical relationship. Intact atlantodental interval. Skull base and vertebrae: No acute fracture. No primary bone lesion or focal pathologic process. Soft tissues and spinal canal: No prevertebral fluid or swelling. No visible canal hematoma. Disc levels: Mild disc space narrowing from C2 through C5 with moderate disc space narrowing from C5 through T1.  Small posterior marginal osteophytes are noted from C4 through C7. Uncovertebral joint osteoarthritis with uncinate spurring from C3-4 through C6-7. These contribute to bilateral neural foraminal encroachment from C4 through T1, mild-to-moderate. No jumped or perched facets. Upper chest: No acute abnormality. A few subpleural blebs are noted at the right lung apex. Other: None IMPRESSION: Cervical spondylosis without acute cervical spine fracture or listhesis. Electronically Signed   By: Tollie Eth M.D.   On: 05/11/2017 18:41    Procedures Procedures (including critical care time)  Medications Ordered in ED Medications  LORazepam (ATIVAN) injection 0-4 mg (0 mg Intravenous Not Given 05/11/17 1655)    Or  LORazepam (ATIVAN) tablet 0-4 mg ( Oral See Alternative 05/11/17 1655)  LORazepam (ATIVAN) injection 0-4 mg (has no administration in time range)    Or  LORazepam (ATIVAN) tablet 0-4 mg (has no administration in time range)  thiamine (VITAMIN B-1) tablet 100 mg ( Oral See Alternative 05/11/17 1700)    Or  thiamine (B-1) injection 100 mg (100 mg Intravenous Given 05/11/17 1700)  acamprosate (CAMPRAL) tablet 666 mg (has no administration in time range)  FLUoxetine (PROZAC) capsule 10 mg (has no administration in time range)  lisinopril (PRINIVIL,ZESTRIL) tablet 10 mg (has no administration in time range)  mirtazapine (REMERON) tablet 15 mg (has no administration in time range)  nicotine polacrilex (NICORETTE) gum 2 mg (has no administration in time range)  phenytoin (DILANTIN) ER capsule 100 mg (has no administration in time range)  sodium chloride 0.9 % bolus 1,000 mL (0 mLs Intravenous Stopped 05/11/17 1841)     Initial Impression / Assessment and Plan / ED Course  I have reviewed the triage vital signs and the nursing notes.  Pertinent labs & imaging results that were available during my care of the patient were reviewed by me and considered in my medical decision making (see chart for  details).     52 y.o. M with PMH/o ETOH intoxication, seizures who presents for evaluation of alcohol intoxication and possible seizure that occurred today.  On EMS arrival, patient was alert and oriented.  No past postictal abnormalities.  Patient states that he has been drinking too much today.  Patient also reports that he fell and hit the back of his head on a railroad. Patient is afebril, non-toxic appearing, sitting comfortably on examination table. Vital signs reviewed and stable. Patient is slightly tachycardic.  Consider seizures from alcohol withdrawal.  Also concern for intracranial abnormality given patient's history of fall, tenderness to posterior occiput.  Plan to check basic labs, CT head. IVF given for fluid resuscitation. CIWA protocol initiated.  Salicylate level unremarkable.  Acetaminophen level unremarkable.  CBC without any significant leukocytosis, anemia.  CMP shows slight hyperglycemia otherwise unremarkable.  CT head is negative for any acute abnormality.  CT C-spine is negative for any acute abnormality.  Ethanol level is 307.  Urine rapid drug screen is positive for marijuana.  UA is negative for any acute infectious etiology.  Per TTS recommendation, patient will be observed overnight for evaluation in the morning once he is more sober.  Final Clinical Impressions(s) / ED Diagnoses   Final diagnoses:  Suicidal ideation  Alcoholic intoxication without complication [facility](HCC)    ED Discharge Orders    None       Rosana HoesLayden, Laisha Rau A, PA-C 05/12/17 0007    Lorre NickAllen, Anthony, MD 05/12/17 1555

## 2017-05-12 DIAGNOSIS — F1014 Alcohol abuse with alcohol-induced mood disorder: Secondary | ICD-10-CM

## 2017-05-12 DIAGNOSIS — F17210 Nicotine dependence, cigarettes, uncomplicated: Secondary | ICD-10-CM

## 2017-05-12 MED ORDER — GABAPENTIN 300 MG PO CAPS: 300.0000 mg | ORAL_CAPSULE | Freq: Three times a day (TID) | ORAL | 0 refills | Status: DC

## 2017-05-12 MED ORDER — GABAPENTIN 100 MG PO CAPS: 200.0000 mg | ORAL_CAPSULE | Freq: Two times a day (BID) | ORAL | Status: DC

## 2017-05-12 MED ORDER — GABAPENTIN 300 MG PO CAPS: 300.0000 mg | ORAL_CAPSULE | Freq: Three times a day (TID) | ORAL | Status: DC

## 2017-05-12 NOTE — Patient Outreach (Signed)
ED Peer Support Specialist Patient Intake (Complete at intake & 30-60 Day Follow-up)  Name: Rodney Watson  MRN: [id number]  Age: 52 y.o.   Date of Admission: 05/12/2017  Intake: Initial Comments:      Primary Reason Admitted: is an 52 y.o. male who presented in the [place] today after having an alcohol withdrawal seizure.  Upon arrival, patient stated that he was suicidal with a plan to walk in front of a train.  Patient was just in [facility] in January and February.  He states that he did not follow-up with his aftercare on both occasions and states that he has been drinking 4-5 forties for the past month and states that he has been smoking 1-2 joints per week.  Patient states that he has a prior suicide attempt in 1989 when he cut his wrists, but states that since then that he has only had suicidal thoughts.  Patient denies HI and when questioned about hallucinations, he hesitated and said, "just put down no." Patient states that he just signed up for the [facility] at [facility], but states that he has not started with their services yet.     Lab values: Alcohol/ETOH: Positive Positive UDS? 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 Yes([facility])  Number of ED visits:    Reason(s) for visit:    In past year, has patient been hospitalized for any reason? No  Number of hospitalizations:    Reason(s) for hospitalization:    In past year, has patient been arrested? Yes  Number of arrests:    Reason(s) for  arrest:    In past year, has patient been incarcerated? No  Number of incarcerations:    Reason(s) for incarceration:    In past year, has patient received medication-assisted treatment? No  In past year, patient received the following treatments:    In past year, has patient received any harm reduction services? No  Did this include any of the following?    In past year, has patient received care from a mental health provider for diagnosis other than SUD? No  In past year, is this first time patient has overdosed? No  Number of past overdoses:    In past year, is this first time patient has been hospitalized for an overdose? No  Number of hospitalizations for overdose(s):    Is patient currently receiving treatment for a mental health diagnosis? No  Patient reports experiencing difficulty participating in SUD treatment: No    Most important reason(s) for this difficulty?    Has patient received prior services for treatment? No  In past, patient has received services from following agencies:    Plan of Care:  Suggested follow up at these agencies/treatment centers: [facility])(Wants to work on getting into Long term facility after he gets personal stuff situated )  Other information: CPSS met with Pt an was able to process with Pt to complete the questioned need for CPSS. CPSS was able to use motivational interviewing to future assist Pt with what he addressed he wanted at this time. CPSS discussed the options that Pt may benefit from in the  community. CPSS was able to give Pt information an contact information for Pt to stay in communication with CPSS.    Aaron Edelman Claire Dolores, CPSS  05/12/2017 10:30 AM

## 2017-05-12 NOTE — [facility] Suicide Risk Assessment (Signed)
Suicide Risk Assessment  Discharge Assessment   [facility]   Principal Problem: Alcohol abuse with alcohol-induced mood disorder [facility](HCC) Discharge Diagnoses:  Patient Active Problem List   Diagnosis Date Noted  . Alcohol abuse with alcohol-induced mood disorder (HCC) [F10.14] 04/10/2017    Priority: High  . MDD (major depressive disorder), recurrent episode, severe (HCC) [F33.2] 04/13/2017  . Alcohol abuse [F10.10] 04/01/2017    Total Time spent with patient: 45 minutes  Musculoskeletal: Strength & Muscle Tone: within normal limits Gait & Station: normal Patient leans: N/A  Psychiatric Specialty Exam: Physical Exam  Constitutional: He is oriented to person, place, and time. He appears well-developed and well-nourished.  HENT:  Head: Normocephalic.  Neck: Normal range of motion.  Respiratory: Effort normal.  Musculoskeletal: Normal range of motion.  Neurological: He is alert and oriented to person, place, and time.  Psychiatric: He has a normal mood and affect. His speech is normal and behavior is normal. Judgment and thought content normal. Cognition and memory are normal.    Review of Systems  Psychiatric/Behavioral: Positive for substance abuse.  All other systems reviewed and are negative.   Blood pressure (!) 155/88, pulse 92, temperature 98.2 F (36.8 C), temperature source Oral, resp. rate 16, SpO2 100 %.There is no height or weight on file to calculate BMI.  General Appearance: Casual  Eye Contact:  Good  Speech:  Normal Rate  Volume:  Normal  Mood:  Euthymic  Affect:  Congruent  Thought Process:  Coherent and Descriptions of Associations: Intact  Orientation:  Full (Time, Place, and Person)  Thought Content:  WDL and Logical  Suicidal Thoughts:  No  Homicidal Thoughts:  No  Memory:  Immediate;   Good Recent;   Good Remote;   Good  Judgement:  Fair  Insight:  Fair  Psychomotor Activity:  Normal  Concentration:  Concentration:  Good and Attention Span: Good  Recall:  Good  Fund of Knowledge:  Good  Language:  Good  Akathisia:  No  Handed:  Right  AIMS (if indicated):     Assets:  Leisure Time Physical Health Resilience  ADL's:  Intact  Cognition:  WNL  Sleep:       Mental Status Per Nursing Assessment::   On Admission:   alcohol abuse with suicidal ideations  Demographic Factors:  Male  Loss Factors: NA  Historical Factors: NA  Risk Reduction Factors:   Sense of responsibility to family  Continued Clinical Symptoms:  None  Cognitive Features That Contribute To Risk:  None    Suicide Risk:  Minimal: No identifiable suicidal ideation.  Patients presenting with no risk factors but with morbid ruminations; may be classified as minimal risk based on the severity of the depressive symptoms    Plan Of Care/Follow-up recommendations:  Activity:  as toleraetd Diet:  heart healthy diet  Yoshino Broccoli, NP 05/12/2017, 11:51 AM

## 2017-05-12 NOTE — [facility] Assessment (Signed)
[facility]    Per Dr Jannifer FranklinAkintayo, patient will be discharged to follow-up with outpatient resources.

## 2017-05-12 NOTE — Consult Note (Addendum)
[place] Psychiatry Consult   Reason for Consult:  Alcohol abuse with suicidal ideations Referring Physician:  EDP Patient Identification: Rodney Watson MRN:  [id number] Principal Diagnosis: Alcohol abuse with alcohol-induced mood disorder [facility]) Diagnosis:   Patient Active Problem List   Diagnosis Date Noted  . Alcohol abuse with alcohol-induced mood disorder ([place]) [F10.14] 04/10/2017    Priority: High  . MDD (major depressive disorder), recurrent episode, severe ([place]) [F33.2] 04/13/2017  . Alcohol abuse [F10.10] 04/01/2017    Total Time spent with patient: 45 minutes  Subjective:   Rodney Watson is a 52 y.o. male patient does not warrant admission.  HPI:  52 yo male who presented to the ED with alcohol intoxication and suicidal ideations.  Today, he is clear and coherent with no suicidal ideations, hallucinations, or withdrawal symptoms.  He met with Peer Support but wants to be discharged as it is not raining and he can walk.  Pleasant and cooperative, stable for discharge.  Past Psychiatric History: alcohol dependence, depression  Risk to Self: NOne Risk to Others: Homicidal Ideation: No Thoughts of Harm to Others: No Current Homicidal Intent: No Current Homicidal Plan: No Access to Homicidal Means: No Identified Victim: none History of harm to others?: No Assessment of Violence: None Noted Does patient have access to weapons?: No Criminal Charges Pending?: No Does patient have a court date: No Prior Inpatient Therapy: Prior Inpatient Therapy: Yes Prior Therapy Dates: (2019 x 2 at [facility]) Prior Therapy Facilty/Provider(s): [facility] Reason for Treatment: (alcohol use and depression) Prior Outpatient Therapy: Prior Outpatient Therapy: No Prior Therapy Dates: NA Prior Therapy Facilty/Provider(s): NA Reason for Treatment: NA Does patient have an ACCT team?: No Does patient have Intensive In-House Services?  : No Does patient have [facility] services? : No Does patient have  P4CC services?: No  Past Medical History:  Past Medical History:  Diagnosis Date  . Alcohol abuse   . COPD (chronic obstructive pulmonary disease) ([place])   . Depression   . ETOH abuse   . Seizures ([place])    History reviewed. No pertinent surgical history. Family History: History reviewed. No pertinent family history. Family Psychiatric  History: none Social History:  Social History   Substance and Sexual Activity  Alcohol Use Yes   Comment: 5th daily     Social History   Substance and Sexual Activity  Drug Use Yes  . Types: Marijuana   Comment: 1-2 joints/week    Social History   Socioeconomic History  . Marital status: Single    Spouse name: Not on file  . Number of children: Not on file  . Years of education: Not on file  . Highest education level: Not on file  Occupational History  . Not on file  Social Needs  . Financial resource strain: Not on file  . Food insecurity:    Worry: Not on file    Inability: Not on file  . Transportation needs:    Medical: Not on file    Non-medical: Not on file  Tobacco Use  . Smoking status: Current Every Day Smoker    Packs/day: 1.50    Years: 39.00    Pack years: 58.50    Types: Cigarettes  . Smokeless tobacco: Current User    Types: Chew  Substance and Sexual Activity  . Alcohol use: Yes    Comment: 5th daily  . Drug use: Yes    Types: Marijuana    Comment: 1-2 joints/week  . Sexual activity: Never  Lifestyle  .  Physical activity:    Days per week: Not on file    Minutes per session: Not on file  . Stress: Not on file  Relationships  . Social connections:    Talks on phone: Not on file    Gets together: Not on file    Attends religious service: Not on file    Active member of club or organization: Not on file    Attends meetings of clubs or organizations: Not on file    Relationship status: Not on file  Other Topics Concern  . Not on file  Social History Narrative   ** Merged History Encounter **        Additional Social History:    Allergies:   Allergies  Allergen Reactions  . Chocolate   . Chocolate Hives  . Vicodin [Hydrocodone-Acetaminophen] Nausea And Vomiting    Labs:  Results for orders placed or performed during the hospital encounter of 05/11/17 (from the past 48 hour(s))  Urinalysis, Routine w reflex microscopic     Status: Abnormal   Collection Time: 05/11/17  4:56 PM  Result Value Ref Range   Color, Urine STRAW (A) YELLOW   APPearance CLEAR CLEAR   Specific Gravity, Urine 1.002 (L) 1.005 - 1.030   pH 7.0 5.0 - 8.0   Glucose, UA NEGATIVE NEGATIVE mg/dL   Hgb urine dipstick NEGATIVE NEGATIVE   Bilirubin Urine NEGATIVE NEGATIVE   Ketones, ur NEGATIVE NEGATIVE mg/dL   Protein, ur NEGATIVE NEGATIVE mg/dL   Nitrite NEGATIVE NEGATIVE   Leukocytes, UA NEGATIVE NEGATIVE    Comment: Performed at [facility], [place] [address] [place], [place] [address]  Urine rapid drug screen (hosp performed)     Status: Abnormal   Collection Time: 05/11/17  4:57 PM  Result Value Ref Range   Opiates NONE DETECTED NONE DETECTED   Cocaine NONE DETECTED NONE DETECTED   Benzodiazepines NONE DETECTED NONE DETECTED   Amphetamines NONE DETECTED NONE DETECTED   Tetrahydrocannabinol POSITIVE (A) NONE DETECTED   Barbiturates NONE DETECTED NONE DETECTED    Comment: (NOTE) DRUG SCREEN FOR MEDICAL PURPOSES ONLY.  IF CONFIRMATION IS NEEDED FOR ANY PURPOSE, NOTIFY LAB WITHIN 5 DAYS. LOWEST DETECTABLE LIMITS FOR URINE DRUG SCREEN Drug Class                     Cutoff (ng/mL) Amphetamine and metabolites    1000 Barbiturate and metabolites    200 Benzodiazepine                 169 Tricyclics and metabolites     300 Opiates and metabolites        300 Cocaine and metabolites        300 THC                            50 Performed at [facility], [place] [address] [place], [place] [address]   Ethanol     Status: Abnormal   Collection Time: 05/11/17  4:57  PM  Result Value Ref Range   Alcohol, Ethyl (B) 307 (HH) <10 mg/dL    Comment:        LOWEST DETECTABLE LIMIT FOR SERUM ALCOHOL IS 10 mg/dL FOR MEDICAL PURPOSES ONLY CRITICAL RESULT CALLED TO, READ BACK BY AND VERIFIED WITH: TAI,M RN [phone] [place],N  Performed at [place] [address], [place], [place] [address]   CBC with Differential  Status: Abnormal   Collection Time: 05/11/17  4:58 PM  Result Value Ref Range   WBC 5.1 4.0 - 10.5 K/uL   RBC 4.83 4.22 - 5.81 MIL/uL   Hemoglobin 15.1 13.0 - 17.0 g/dL   HCT 44.9 39.0 - 52.0 %   MCV 93.0 78.0 - 100.0 fL   MCH 31.3 26.0 - 34.0 pg   MCHC 33.6 30.0 - 36.0 g/dL   RDW 16.1 (H) 11.5 - 15.5 %   Platelets 258 150 - 400 K/uL   Neutrophils Relative % 49 %   Neutro Abs 2.5 1.7 - 7.7 K/uL   Lymphocytes Relative 40 %   Lymphs Abs 2.0 0.7 - 4.0 K/uL   Monocytes Relative 9 %   Monocytes Absolute 0.5 0.1 - 1.0 K/uL   Eosinophils Relative 1 %   Eosinophils Absolute 0.1 0.0 - 0.7 K/uL   Basophils Relative 1 %   Basophils Absolute 0.1 0.0 - 0.1 K/uL    Comment: Performed at [facility], [place] [address] [place], [place] [address]  Comprehensive metabolic panel     Status: Abnormal   Collection Time: 05/11/17  4:58 PM  Result Value Ref Range   Sodium 143 135 - 145 mmol/L   Potassium 3.8 3.5 - 5.1 mmol/L   Chloride 105 101 - 111 mmol/L   CO2 26 22 - 32 mmol/L   Glucose, Bld 103 (H) 65 - 99 mg/dL   BUN 5 (L) 6 - 20 mg/dL   Creatinine, Ser 0.70 0.61 - 1.24 mg/dL   Calcium 8.3 (L) 8.9 - 10.3 mg/dL   Total Protein 6.9 6.5 - 8.1 g/dL   Albumin 3.8 3.5 - 5.0 g/dL   AST 35 15 - 41 U/L   ALT 16 (L) 17 - 63 U/L   Alkaline Phosphatase 67 38 - 126 U/L   Total Bilirubin 0.7 0.3 - 1.2 mg/dL   GFR calc non Af Amer >60 >60 mL/min   GFR calc Af Amer >60 >60 mL/min    Comment: (NOTE) The eGFR has been calculated using the CKD EPI equation. This calculation has not been validated in all clinical  situations. eGFR's persistently <60 mL/min signify possible Chronic Kidney Disease.    Anion gap 12 5 - 15    Comment: Performed at [facility], [place] [address], [place], [place] [address]  Salicylate level     Status: None   Collection Time: 05/11/17  4:58 PM  Result Value Ref Range   Salicylate Lvl <5.0 2.8 - 30.0 mg/dL    Comment: Performed at [facility], [place] [address] [place], [place] [address]  Acetaminophen level     Status: Abnormal   Collection Time: 05/11/17  4:58 PM  Result Value Ref Range   Acetaminophen (Tylenol), Serum <10 (L) 10 - 30 ug/mL    Comment:        THERAPEUTIC CONCENTRATIONS VARY SIGNIFICANTLY. A RANGE OF 10-30 ug/mL MAY BE AN EFFECTIVE CONCENTRATION FOR MANY PATIENTS. HOWEVER, SOME ARE BEST TREATED AT CONCENTRATIONS OUTSIDE THIS RANGE. ACETAMINOPHEN CONCENTRATIONS >150 ug/mL AT 4 HOURS AFTER INGESTION AND >50 ug/mL AT 12 HOURS AFTER INGESTION ARE OFTEN ASSOCIATED WITH TOXIC REACTIONS. Performed at [facility], [place] [address], [place], [place] [address]     Current Facility-Administered Medications  Medication Dose Route Frequency Provider Last Rate Last Dose  . acamprosate (CAMPRAL) tablet 666 mg  666 mg Oral TID WC Providence Lanius A, PA-C   666 mg at 05/12/17  1021  . FLUoxetine (PROZAC) capsule 10 mg  10 mg Oral Daily Providence Lanius A, PA-C   10 mg at 05/12/17 1021  . gabapentin (NEURONTIN) capsule 200 mg  200 mg Oral BID Geovanni Rahming, MD      . lisinopril (PRINIVIL,ZESTRIL) tablet 10 mg  10 mg Oral Daily Layden, Lindsey A, PA-C   10 mg at 05/12/17 1022  . mirtazapine (REMERON) tablet 15 mg  15 mg Oral QHS Layden, Lindsey A, PA-C      . nicotine polacrilex (NICORETTE) gum 2 mg  2 mg Oral PRN Providence Lanius A, PA-C      . phenytoin (DILANTIN) ER capsule 100 mg  100 mg Oral TID Providence Lanius A, PA-C   100 mg at 05/12/17 1021  . thiamine (VITAMIN B-1) tablet 100 mg  100 mg Oral Daily  Providence Lanius A, PA-C   100 mg at 05/12/17 1021   Or  . thiamine (B-1) injection 100 mg  100 mg Intravenous Daily Providence Lanius A, PA-C   100 mg at 05/11/17 1700   Current Outpatient Medications  Medication Sig Dispense Refill  . acamprosate (CAMPRAL) 333 MG tablet Take 2 tablets (666 mg total) by mouth 3 (three) times daily with meals. For alcoholism 180 tablet 0  . FLUoxetine (PROZAC) 10 MG capsule Take 1 capsule (10 mg total) by mouth daily. For depression 30 capsule 0  . gabapentin (NEURONTIN) 300 MG capsule Take 1 capsule (300 mg total) by mouth 3 (three) times daily. For agitation 90 capsule 0  . hydrOXYzine (ATARAX/VISTARIL) 25 MG tablet Take 1 tablet (25 mg) by mouth four times daily as needed: For aniety 60 tablet 0  . lisinopril (PRINIVIL,ZESTRIL) 10 MG tablet Take 1 tablet (10 mg total) by mouth daily. For high blood pressure 30 tablet 0  . mirtazapine (REMERON) 15 MG tablet Take 1 tablet (15 mg total) by mouth at bedtime. For depression/sleep 30 tablet 0  . nicotine polacrilex (NICORETTE) 2 MG gum Take 1 each (2 mg total) by mouth as needed for smoking cessation. (May purchase from over the counter): For smoking cessation 100 tablet 0  . phenytoin (DILANTIN) 100 MG ER capsule Take 1 capsule (100 mg total) by mouth 3 (three) times daily. For seizure activities 60 capsule 0  . traZODone (DESYREL) 100 MG tablet Take 1 tablet (100 mg total) by mouth at bedtime as needed for sleep. 30 tablet 0  . triamcinolone cream (KENALOG) 0.1 % Apply 1 application topically 3 (three) times daily. For rashes 30 g 0    Musculoskeletal: Strength & Muscle Tone: within normal limits Gait & Station: normal Patient leans: N/A  Psychiatric Specialty Exam: Physical Exam  Constitutional: He is oriented to person, place, and time. He appears well-developed and well-nourished.  HENT:  Head: Normocephalic.  Neck: Normal range of motion.  Respiratory: Effort normal.  Musculoskeletal: Normal range of  motion.  Neurological: He is alert and oriented to person, place, and time.  Psychiatric: He has a normal mood and affect. His speech is normal and behavior is normal. Judgment and thought content normal. Cognition and memory are normal.    Review of Systems  Psychiatric/Behavioral: Positive for substance abuse.  All other systems reviewed and are negative.   Blood pressure (!) 155/88, pulse 92, temperature 98.2 F (36.8 C), temperature source Oral, resp. rate 16, SpO2 100 %.There is no height or weight on file to calculate BMI.  General Appearance: Casual  Eye Contact:  Good  Speech:  Normal  Rate  Volume:  Normal  Mood:  Euthymic  Affect:  Congruent  Thought Process:  Coherent and Descriptions of Associations: Intact  Orientation:  Full (Time, Place, and Person)  Thought Content:  WDL and Logical  Suicidal Thoughts:  No  Homicidal Thoughts:  No  Memory:  Immediate;   Good Recent;   Good Remote;   Good  Judgement:  Fair  Insight:  Fair  Psychomotor Activity:  Normal  Concentration:  Concentration: Good and Attention Span: Good  Recall:  Good  Fund of Knowledge:  Good  Language:  Good  Akathisia:  No  Handed:  Right  AIMS (if indicated):     Assets:  Leisure Time Physical Health Resilience  ADL's:  Intact  Cognition:  WNL  Sleep:        Treatment Plan Summary: Daily contact with patient to assess and evaluate symptoms and progress in treatment, Medication management and Plan alcohol abuse with alcohol induced mood disorder:  -Crisis stabilization -Medication management:  Ativan alcohol detox protocol, Campral 666 mg TID for alcohol abuse, Remeron 15 mg at bedtime for sleep, gabapentin 200 mg BID for alcohol withdrawal increased to 300 mg TID, and Prozac 10 mg daily for depression.  Medical medications restarted. -Individual and substance abuse counseling -Peer support  Disposition: No evidence of imminent risk to self or others at present.    Waylan Boga,  NP 05/12/2017 11:34 AM  Patient seen face-to-face for psychiatric evaluation, chart reviewed and case discussed with the physician extender and developed treatment plan. Reviewed the information documented and agree with the treatment plan. Corena Pilgrim, MD

## 2017-05-15 ENCOUNTER — Other Ambulatory Visit: Payer: Self-pay

## 2017-05-15 ENCOUNTER — Emergency Department (HOSPITAL_COMMUNITY)
Admission: EM | Admit: 2017-05-15 | Discharge: 2017-05-16 | Disposition: A | Discharge status: Other Acute Inpatient Hospital | Payer: Medicaid Other | Attending: Emergency Medicine | Admitting: Emergency Medicine

## 2017-05-15 ENCOUNTER — Encounter (HOSPITAL_COMMUNITY): Payer: Self-pay | Admitting: *Deleted

## 2017-05-15 DIAGNOSIS — F329 Major depressive disorder, single episode, unspecified: Secondary | ICD-10-CM | POA: Diagnosis present

## 2017-05-15 DIAGNOSIS — Y907 Blood alcohol level of 200-239 mg/100 ml: Secondary | ICD-10-CM | POA: Diagnosis not present

## 2017-05-15 DIAGNOSIS — Z008 Encounter for other general examination: Secondary | ICD-10-CM | POA: Insufficient documentation

## 2017-05-15 DIAGNOSIS — F332 Major depressive disorder, recurrent severe without psychotic features: Secondary | ICD-10-CM | POA: Diagnosis not present

## 2017-05-15 DIAGNOSIS — F17210 Nicotine dependence, cigarettes, uncomplicated: Secondary | ICD-10-CM | POA: Insufficient documentation

## 2017-05-15 DIAGNOSIS — Z79899 Other long term (current) drug therapy: Secondary | ICD-10-CM | POA: Diagnosis not present

## 2017-05-15 DIAGNOSIS — F10229 Alcohol dependence with intoxication, unspecified: Secondary | ICD-10-CM | POA: Diagnosis not present

## 2017-05-15 DIAGNOSIS — F1010 Alcohol abuse, uncomplicated: Secondary | ICD-10-CM

## 2017-05-15 DIAGNOSIS — J449 Chronic obstructive pulmonary disease, unspecified: Secondary | ICD-10-CM | POA: Diagnosis not present

## 2017-05-15 DIAGNOSIS — R45851 Suicidal ideations: Secondary | ICD-10-CM | POA: Diagnosis not present

## 2017-05-15 LAB — COMPREHENSIVE METABOLIC PANEL
ALBUMIN: 4.4 g/dL (ref 3.5–5.0)
ALT: 21 U/L (ref 17–63)
ANION GAP: 11 (ref 5–15)
AST: 36 U/L (ref 15–41)
Alkaline Phosphatase: 84 U/L (ref 38–126)
BILIRUBIN TOTAL: 0.6 mg/dL (ref 0.3–1.2)
BUN: 8 mg/dL (ref 6–20)
CHLORIDE: 103 mmol/L (ref 101–111)
CO2: 26 mmol/L (ref 22–32)
Calcium: 9.1 mg/dL (ref 8.9–10.3)
Creatinine, Ser: 0.87 mg/dL (ref 0.61–1.24)
GFR calc Af Amer: >60 mL/min (ref >60)
Glucose, Bld: 99 mg/dL (ref 65–99)
POTASSIUM: 3.9 mmol/L (ref 3.5–5.1)
Sodium: 140 mmol/L (ref 135–145)
TOTAL PROTEIN: 8.1 g/dL (ref 6.5–8.1)

## 2017-05-15 LAB — CBC
HCT: 48.8 % (ref 39.0–52.0)
Hemoglobin: 16.9 g/dL (ref 13.0–17.0)
MCH: 31.9 pg (ref 26.0–34.0)
MCHC: 34.6 g/dL (ref 30.0–36.0)
MCV: 92.1 fL (ref 78.0–100.0)
PLATELETS: 217 10*3/uL (ref 150–400)
RBC: 5.3 MIL/uL (ref 4.22–5.81)
RDW: 15.9 % — AB (ref 11.5–15.5)
WBC: 7.7 10*3/uL (ref 4.0–10.5)

## 2017-05-15 LAB — SALICYLATE LEVEL: Salicylate Lvl: <7 mg/dL (ref 2.8–30.0)

## 2017-05-15 LAB — ACETAMINOPHEN LEVEL

## 2017-05-15 LAB — ETHANOL: ALCOHOL ETHYL (B): 200 mg/dL — AB (ref <10)

## 2017-05-15 LAB — RAPID URINE DRUG SCREEN, HOSP PERFORMED
AMPHETAMINES: NOT DETECTED
BENZODIAZEPINES: NOT DETECTED
Barbiturates: NOT DETECTED
COCAINE: NOT DETECTED
OPIATES: NOT DETECTED
Tetrahydrocannabinol: POSITIVE — AB

## 2017-05-15 MED ORDER — PHENYTOIN SODIUM EXTENDED 100 MG PO CAPS
200.0000 mg | ORAL_CAPSULE | Freq: Two times a day (BID) | ORAL | Status: DC
  Administered 2017-05-15: 200 mg via ORAL
  Filled 2017-05-15: qty 2

## 2017-05-15 MED ORDER — LORAZEPAM 1 MG PO TABS: 0.0000 mg | ORAL_TABLET | Freq: Two times a day (BID) | ORAL | Status: DC

## 2017-05-15 MED ORDER — THIAMINE HCL 100 MG/ML IJ SOLN: 100.0000 mg | Freq: Every day | INTRAMUSCULAR | Status: DC

## 2017-05-15 MED ORDER — LORAZEPAM 2 MG/ML IJ SOLN: 0.0000 mg | Freq: Four times a day (QID) | INTRAMUSCULAR | Status: DC

## 2017-05-15 MED ORDER — LISINOPRIL 10 MG PO TABS
10.0000 mg | ORAL_TABLET | Freq: Every day | ORAL | Status: DC
  Administered 2017-05-15: 10 mg via ORAL
  Filled 2017-05-15: qty 1

## 2017-05-15 MED ORDER — LORAZEPAM 2 MG/ML IJ SOLN: 0.0000 mg | Freq: Two times a day (BID) | INTRAMUSCULAR | Status: DC

## 2017-05-15 MED ORDER — VITAMIN B-1 100 MG PO TABS
100.0000 mg | ORAL_TABLET | Freq: Every day | ORAL | Status: DC
  Administered 2017-05-15: 100 mg via ORAL
  Filled 2017-05-15: qty 1

## 2017-05-15 MED ORDER — GABAPENTIN 300 MG PO CAPS
300.0000 mg | ORAL_CAPSULE | Freq: Three times a day (TID) | ORAL | Status: DC
Start: 2017-05-15 — End: 2017-05-16
  Administered 2017-05-15: 300 mg via ORAL
  Filled 2017-05-15: qty 1

## 2017-05-15 MED ORDER — LORAZEPAM 1 MG PO TABS
0.0000 mg | ORAL_TABLET | Freq: Four times a day (QID) | ORAL | Status: DC
  Administered 2017-05-15: 2 mg via ORAL
  Filled 2017-05-15: qty 2

## 2017-05-15 NOTE — ED Notes (Signed)
Bed: WBH43 Expected date:  Expected time:  Means of arrival:  Comments: Triage 3 

## 2017-05-15 NOTE — ED Provider Notes (Signed)
[facility]-EMERGENCY DEPT Provider Note   CSN: [id number] Arrival date & time: 05/15/17  1621     History   Chief Complaint Chief Complaint  Patient presents with  . Suicidal  . Medical Clearance    HPI Rodney Watson is a 52 y.o. male with a past medical history of alcohol abuse, depression, COPD, who presents to ED for medical clearance.  He was [facility] after presenting for his suicidal ideations with plan of wanting to walk in front of a train.  He is also requesting detox from his alcohol abuse.  His last drink was this morning and he drank three 40 ounce beers.  He has tried detoxing from alcohol several months ago unsuccessfully.  Denies any HI, AVH. Per triage note, [facility] days of alcohol detox and stabilize on blood pressure and seizure medications.  He admits that he is not taking his hypertension or seizure medications in several weeks due to financial issues.  HPI  Past Medical History:  Diagnosis Date  . Alcohol abuse   . COPD (chronic obstructive pulmonary disease) (HCC)   . Depression   . ETOH abuse   . Seizures [facility](HCC)     Patient Active Problem List   Diagnosis Date Noted  . MDD (major depressive disorder), recurrent episode, severe (HCC) 04/13/2017  . Alcohol abuse with alcohol-induced mood disorder (HCC) 04/10/2017  . Alcohol abuse 04/01/2017    History reviewed. No pertinent surgical history.      Home Medications    Prior to Admission medications   Medication Sig Start Date End Date Taking? Authorizing Provider  acamprosate (CAMPRAL) 333 MG tablet Take 2 tablets (666 mg total) by mouth 3 (three) times daily with meals. For alcoholism 04/16/17  Yes Armandina StammerNwoko, Agnes I, NP  cloNIDine (CATAPRES) 0.1 MG tablet Take 0.1 mg by mouth daily.   Yes [provider]  FLUoxetine (PROZAC) 10 MG capsule Take 1 capsule (10 mg total) by mouth daily. For depression 04/17/17  Yes Nwoko, Nicole KindredAgnes I, NP    gabapentin (NEURONTIN) 300 MG capsule Take 1 capsule (300 mg total) by mouth 3 (three) times daily. For agitation 04/16/17  Yes Armandina StammerNwoko, Agnes I, NP  gabapentin (NEURONTIN) 300 MG capsule Take 1 capsule (300 mg total) by mouth 3 (three) times daily. 05/12/17  Yes Charm RingsLord, Jamison Y, NP  hydrOXYzine (ATARAX/VISTARIL) 25 MG tablet Take 1 tablet (25 mg) by mouth four times daily as needed: For aniety 04/16/17  Yes Nwoko, Nicole KindredAgnes I, NP  lisinopril (PRINIVIL,ZESTRIL) 10 MG tablet Take 1 tablet (10 mg total) by mouth daily. For high blood pressure 04/17/17  Yes Nwoko, Agnes I, NP  mirtazapine (REMERON) 15 MG tablet Take 1 tablet (15 mg total) by mouth at bedtime. For depression/sleep 04/16/17  Yes Armandina StammerNwoko, Agnes I, NP  phenytoin (DILANTIN) 100 MG ER capsule Take 200 mg by mouth 2 (two) times daily.   Yes [provider]  traZODone (DESYREL) 100 MG tablet Take 1 tablet (100 mg total) by mouth at bedtime as needed for sleep. 04/16/17  Yes Armandina StammerNwoko, Agnes I, NP  triamcinolone cream (KENALOG) 0.1 % Apply 1 application topically 3 (three) times daily. For rashes 04/16/17  Yes Armandina StammerNwoko, Agnes I, NP  phenytoin (DILANTIN) 100 MG ER capsule Take 1 capsule (100 mg total) by mouth 3 (three) times daily. For seizure activities Patient not taking: Reported on 05/15/2017 04/16/17   Sanjuana KavaNwoko, Agnes I, NP    Family History No family  history on file.  Social History Social History   Tobacco Use  . Smoking status: Current Every Day Smoker    Packs/day: 1.50    Years: 39.00    Pack years: 58.50    Types: Cigarettes  . Smokeless tobacco: Current User    Types: Chew  Substance Use Topics  . Alcohol use: Yes    Comment: 5th daily  . Drug use: Yes    Types: Marijuana    Comment: 1-2 joints/week     Allergies   Chocolate; Chocolate; and Vicodin [hydrocodone-acetaminophen]   Review of Systems Review of Systems  Constitutional: Negative for appetite change, chills and fever.  HENT: Negative for ear pain, rhinorrhea,  sneezing and sore throat.   Eyes: Negative for photophobia and visual disturbance.  Respiratory: Negative for cough, chest tightness, shortness of breath and wheezing.   Cardiovascular: Negative for chest pain and palpitations.  Gastrointestinal: Negative for abdominal pain, blood in stool, constipation, diarrhea, nausea and vomiting.  Genitourinary: Negative for dysuria, hematuria and urgency.  Musculoskeletal: Negative for myalgias.  Skin: Negative for rash.  Neurological: Negative for dizziness, weakness and light-headedness.  Psychiatric/Behavioral: Positive for suicidal ideas. Negative for agitation, decreased concentration and hallucinations.     Physical Exam Updated Vital Signs BP (!) 117/91   Pulse (!) 117   Temp 98.8 F (37.1 C) (Oral)   Resp 18   Wt 61.2 kg (135 lb)   SpO2 98%   BMI 22.12 kg/m   Physical Exam  Constitutional: He appears well-developed and well-nourished. No distress.  No tremors noted.  HENT:  Head: Normocephalic and atraumatic.  Nose: Nose normal.  Eyes: Conjunctivae and EOM are normal. Left eye exhibits no discharge. No scleral icterus.  Neck: Normal range of motion. Neck supple.  Cardiovascular: Regular rhythm, normal heart sounds and intact distal pulses. Tachycardia present. Exam reveals no gallop and no friction rub.  No murmur heard. Pulmonary/Chest: Effort normal and breath sounds normal. No respiratory distress.  Abdominal: Soft. Bowel sounds are normal. He exhibits no distension. There is no tenderness. There is no guarding.  Musculoskeletal: Normal range of motion. He exhibits no edema.  Neurological: He is alert. He exhibits normal muscle tone. Coordination normal.  Skin: Skin is warm and dry. No rash noted.  Psychiatric: His mood appears anxious. He is not actively hallucinating. He expresses suicidal ideation. He expresses suicidal plans.  Nursing note and vitals reviewed.    ED Treatments / Results  Labs (all labs ordered are  listed, but only abnormal results are displayed) Labs Reviewed  ETHANOL - Abnormal; Notable for the following components:      Result Value   Alcohol, Ethyl (B) 200 (*)    All other components within normal limits  ACETAMINOPHEN LEVEL - Abnormal; Notable for the following components:   Acetaminophen (Tylenol), Serum <10 (*)    All other components within normal limits  CBC - Abnormal; Notable for the following components:   RDW 15.9 (*)    All other components within normal limits  RAPID URINE DRUG SCREEN, HOSP PERFORMED - Abnormal; Notable for the following components:   Tetrahydrocannabinol POSITIVE (*)    All other components within normal limits  COMPREHENSIVE METABOLIC PANEL  SALICYLATE LEVEL    EKG None  Radiology No results found.  Procedures Procedures (including critical care time)  Medications Ordered in ED Medications  LORazepam (ATIVAN) injection 0-4 mg (has no administration in time range)    Or  LORazepam (ATIVAN) tablet 0-4 mg (has no  administration in time range)  LORazepam (ATIVAN) injection 0-4 mg (has no administration in time range)    Or  LORazepam (ATIVAN) tablet 0-4 mg (has no administration in time range)  thiamine (VITAMIN B-1) tablet 100 mg (has no administration in time range)    Or  thiamine (B-1) injection 100 mg (has no administration in time range)  lisinopril (PRINIVIL,ZESTRIL) tablet 10 mg (has no administration in time range)  phenytoin (DILANTIN) ER capsule 200 mg (has no administration in time range)  gabapentin (NEURONTIN) capsule 300 mg (has no administration in time range)     Initial Impression / Assessment and Plan / ED Course  I have reviewed the triage vital signs and the nursing notes.  Pertinent labs & imaging results that were available during my care of the patient were reviewed by me and considered in my medical decision making (see chart for details).     Patient presents to ED for evaluation of medical clearance  after being sent from [facility].  He presented there for suicidal ideations with plan of wanting to walk in front of a train.  He does have a history of alcohol abuse, depression, COPD.  They requesting that he detox from alcohol for 3 days and become stabilized on his hypertension and seizure medications.  He tells me he is not taking these medications in several weeks due to financial issues.  On presentation he does admit to SI but denies any HI, AVH.  He is tachycardic to 110s but no tremors noted.  His last drink was this morning which was 3-40 ounce beers. Medical screening lab work significant for UDS positive for [facility] and alcohol level of 200.  Patient placed on [place] protocol and will be monitored.  Will reorder home hypertension and epilepsy medications. TTS evaluation pending.  Portions of this note were generated with Scientist, clinical (histocompatibility and immunogenetics). Dictation errors may occur despite best attempts at proofreading.   Final Clinical Impressions(s) / ED Diagnoses   Final diagnoses:  None    ED Discharge Orders    None       Dietrich Pates, PA-C 05/15/17 1902    Terrilee Files, MD 05/16/17 1155

## 2017-05-15 NOTE — ED Notes (Signed)
Bed: WLPT3 Expected date:  Expected time:  Means of arrival:  Comments: 
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
## 2017-05-15 NOTE — ED Triage Notes (Addendum)
[facility]. Pt is suicidal, states he plans to walk in front of a fright train. [facility] paperwork states the pt may return after 3 days of alcohol detox and stabilized on blood pressure and seizure medication. Pt had three 40 oz beers this morning.

## 2017-05-16 ENCOUNTER — Inpatient Hospital Stay (HOSPITAL_COMMUNITY)
Admission: AD | Admit: 2017-05-16 | Discharge: 2017-05-20 | DRG: 897 | Disposition: A | Discharge status: Home / Self Care | Payer: Medicaid Other | Source: Intra-hospital | Attending: Psychiatry | Admitting: Psychiatry

## 2017-05-16 ENCOUNTER — Encounter (HOSPITAL_COMMUNITY): Payer: Self-pay

## 2017-05-16 ENCOUNTER — Other Ambulatory Visit: Payer: Self-pay

## 2017-05-16 DIAGNOSIS — F332 Major depressive disorder, recurrent severe without psychotic features: Secondary | ICD-10-CM | POA: Diagnosis not present

## 2017-05-16 DIAGNOSIS — Z79899 Other long term (current) drug therapy: Secondary | ICD-10-CM | POA: Diagnosis not present

## 2017-05-16 DIAGNOSIS — Z9149 Other personal history of psychological trauma, not elsewhere classified: Secondary | ICD-10-CM

## 2017-05-16 DIAGNOSIS — Z590 Homelessness: Secondary | ICD-10-CM | POA: Diagnosis not present

## 2017-05-16 DIAGNOSIS — Z91018 Allergy to other foods: Secondary | ICD-10-CM

## 2017-05-16 DIAGNOSIS — R450 Nervousness: Secondary | ICD-10-CM

## 2017-05-16 DIAGNOSIS — F1024 Alcohol dependence with alcohol-induced mood disorder: Secondary | ICD-10-CM | POA: Diagnosis present

## 2017-05-16 DIAGNOSIS — F1014 Alcohol abuse with alcohol-induced mood disorder: Secondary | ICD-10-CM

## 2017-05-16 DIAGNOSIS — Z9119 Patient's noncompliance with other medical treatment and regimen: Secondary | ICD-10-CM

## 2017-05-16 DIAGNOSIS — Y907 Blood alcohol level of 200-239 mg/100 ml: Secondary | ICD-10-CM | POA: Diagnosis present

## 2017-05-16 DIAGNOSIS — G40909 Epilepsy, unspecified, not intractable, without status epilepticus: Secondary | ICD-10-CM | POA: Diagnosis present

## 2017-05-16 DIAGNOSIS — G4700 Insomnia, unspecified: Secondary | ICD-10-CM | POA: Diagnosis present

## 2017-05-16 DIAGNOSIS — F4010 Social phobia, unspecified: Secondary | ICD-10-CM | POA: Diagnosis not present

## 2017-05-16 DIAGNOSIS — F17210 Nicotine dependence, cigarettes, uncomplicated: Secondary | ICD-10-CM | POA: Diagnosis present

## 2017-05-16 DIAGNOSIS — S0121XA Laceration without foreign body of nose, initial encounter: Secondary | ICD-10-CM | POA: Diagnosis present

## 2017-05-16 DIAGNOSIS — Z885 Allergy status to narcotic agent status: Secondary | ICD-10-CM

## 2017-05-16 DIAGNOSIS — F10229 Alcohol dependence with intoxication, unspecified: Secondary | ICD-10-CM | POA: Diagnosis present

## 2017-05-16 DIAGNOSIS — I10 Essential (primary) hypertension: Secondary | ICD-10-CM | POA: Diagnosis present

## 2017-05-16 DIAGNOSIS — F419 Anxiety disorder, unspecified: Secondary | ICD-10-CM | POA: Diagnosis not present

## 2017-05-16 DIAGNOSIS — F10239 Alcohol dependence with withdrawal, unspecified: Secondary | ICD-10-CM | POA: Diagnosis present

## 2017-05-16 DIAGNOSIS — X58XXXA Exposure to other specified factors, initial encounter: Secondary | ICD-10-CM | POA: Diagnosis present

## 2017-05-16 DIAGNOSIS — F411 Generalized anxiety disorder: Secondary | ICD-10-CM | POA: Diagnosis present

## 2017-05-16 DIAGNOSIS — J449 Chronic obstructive pulmonary disease, unspecified: Secondary | ICD-10-CM | POA: Diagnosis present

## 2017-05-16 DIAGNOSIS — F1290 Cannabis use, unspecified, uncomplicated: Secondary | ICD-10-CM | POA: Diagnosis not present

## 2017-05-16 MED ORDER — NICOTINE POLACRILEX 2 MG MT GUM
2.0000 mg | CHEWING_GUM | OROMUCOSAL | Status: DC | PRN
  Administered 2017-05-16 – 2017-05-18 (×6): 2 mg via ORAL
  Filled 2017-05-16 (×2): qty 1

## 2017-05-16 MED ORDER — ALUM & MAG HYDROXIDE-SIMETH 200-200-20 MG/5ML PO SUSP: 30.0000 mL | ORAL | Status: DC | PRN

## 2017-05-16 MED ORDER — NICOTINE POLACRILEX 2 MG MT GUM
CHEWING_GUM | OROMUCOSAL | Status: AC
  Administered 2017-05-16: 2 mg via ORAL
  Filled 2017-05-16: qty 1

## 2017-05-16 MED ORDER — MAGNESIUM HYDROXIDE 400 MG/5ML PO SUSP: 30.0000 mL | Freq: Every day | ORAL | Status: DC | PRN

## 2017-05-16 MED ORDER — PHENYTOIN SODIUM EXTENDED 100 MG PO CAPS
100.0000 mg | ORAL_CAPSULE | Freq: Three times a day (TID) | ORAL | Status: DC
  Administered 2017-05-16 – 2017-05-20 (×13): 100 mg via ORAL
  Filled 2017-05-16 (×16): qty 1

## 2017-05-16 MED ORDER — TRIAMCINOLONE ACETONIDE 0.1 % EX CREA
TOPICAL_CREAM | Freq: Two times a day (BID) | CUTANEOUS | Status: DC
  Administered 2017-05-16 – 2017-05-17 (×2): via TOPICAL
  Administered 2017-05-18: 1 via TOPICAL
  Administered 2017-05-19 – 2017-05-20 (×2): via TOPICAL
  Filled 2017-05-16: qty 15

## 2017-05-16 MED ORDER — LORAZEPAM 1 MG PO TABS
1.0000 mg | ORAL_TABLET | Freq: Three times a day (TID) | ORAL | Status: AC
  Administered 2017-05-17 (×3): 1 mg via ORAL
  Filled 2017-05-16 (×2): qty 1

## 2017-05-16 MED ORDER — LORAZEPAM 1 MG PO TABS
1.0000 mg | ORAL_TABLET | Freq: Four times a day (QID) | ORAL | Status: AC
  Administered 2017-05-16 (×4): 1 mg via ORAL
  Filled 2017-05-16 (×4): qty 1

## 2017-05-16 MED ORDER — LORAZEPAM 1 MG PO TABS
1.0000 mg | ORAL_TABLET | Freq: Two times a day (BID) | ORAL | Status: AC
  Administered 2017-05-18 (×2): 1 mg via ORAL
  Filled 2017-05-16: qty 1

## 2017-05-16 MED ORDER — ONDANSETRON 4 MG PO TBDP: 4.0000 mg | ORAL_TABLET | Freq: Four times a day (QID) | ORAL | Status: AC | PRN

## 2017-05-16 MED ORDER — TRAZODONE HCL 100 MG PO TABS
100.0000 mg | ORAL_TABLET | Freq: Every evening | ORAL | Status: DC | PRN
  Administered 2017-05-16: 100 mg via ORAL
  Filled 2017-05-16 (×6): qty 1

## 2017-05-16 MED ORDER — LISINOPRIL 10 MG PO TABS
10.0000 mg | ORAL_TABLET | Freq: Every day | ORAL | Status: DC
  Administered 2017-05-16 – 2017-05-20 (×5): 10 mg via ORAL
  Filled 2017-05-16 (×6): qty 1

## 2017-05-16 MED ORDER — LOPERAMIDE HCL 2 MG PO CAPS: 2.0000 mg | ORAL_CAPSULE | ORAL | Status: AC | PRN

## 2017-05-16 MED ORDER — GABAPENTIN 300 MG PO CAPS
300.0000 mg | ORAL_CAPSULE | Freq: Two times a day (BID) | ORAL | Status: DC
  Administered 2017-05-16 – 2017-05-20 (×9): 300 mg via ORAL
  Filled 2017-05-16 (×11): qty 1

## 2017-05-16 MED ORDER — MIRTAZAPINE 15 MG PO TABS
15.0000 mg | ORAL_TABLET | Freq: Every day | ORAL | Status: DC
  Administered 2017-05-16 – 2017-05-19 (×4): 15 mg via ORAL
  Filled 2017-05-16 (×5): qty 1

## 2017-05-16 MED ORDER — VITAMIN B-1 100 MG PO TABS
100.0000 mg | ORAL_TABLET | Freq: Every day | ORAL | Status: DC
  Administered 2017-05-17 – 2017-05-20 (×4): 100 mg via ORAL
  Filled 2017-05-16 (×5): qty 1

## 2017-05-16 MED ORDER — HYDROXYZINE HCL 25 MG PO TABS
25.0000 mg | ORAL_TABLET | Freq: Four times a day (QID) | ORAL | Status: AC | PRN
  Administered 2017-05-17 – 2017-05-18 (×3): 25 mg via ORAL
  Filled 2017-05-16 (×3): qty 1

## 2017-05-16 MED ORDER — THIAMINE HCL 100 MG/ML IJ SOLN: 100.0000 mg | Freq: Once | INTRAMUSCULAR | Status: DC

## 2017-05-16 MED ORDER — ADULT MULTIVITAMIN W/MINERALS CH
1.0000 | ORAL_TABLET | Freq: Every day | ORAL | Status: DC
  Administered 2017-05-16 – 2017-05-20 (×5): 1 via ORAL
  Filled 2017-05-16 (×6): qty 1

## 2017-05-16 MED ORDER — LORAZEPAM 1 MG PO TABS
1.0000 mg | ORAL_TABLET | Freq: Every day | ORAL | Status: AC
  Administered 2017-05-19: 1 mg via ORAL
  Filled 2017-05-16 (×2): qty 1

## 2017-05-16 MED ORDER — LORAZEPAM 1 MG PO TABS
1.0000 mg | ORAL_TABLET | Freq: Four times a day (QID) | ORAL | Status: AC | PRN
  Administered 2017-05-18: 1 mg via ORAL
  Filled 2017-05-16 (×2): qty 1

## 2017-05-16 MED ORDER — FLUOXETINE HCL 20 MG PO CAPS
20.0000 mg | ORAL_CAPSULE | Freq: Every day | ORAL | Status: DC
  Administered 2017-05-16 – 2017-05-20 (×5): 20 mg via ORAL
  Filled 2017-05-16 (×7): qty 1

## 2017-05-16 NOTE — [facility] Suicide Risk Assessment (Signed)
[facility] INPATIENT:  Family/Significant Other Suicide Prevention Education  Suicide Prevention Education:  Patient Refusal for Family/Significant Other Suicide Prevention Education: The patient Rodney Watson has refused to provide written consent for family/significant other to be provided Family/Significant Other Suicide Prevention Education during admission and/or prior to discharge.  Physician notified.  SPE completed with pt, as pt refused to consent to family contact. SPI pamphlet provided to pt and pt was encouraged to share information with support network, ask questions, and talk about any concerns relating to SPE. Pt denies access to guns/firearms and verbalized understanding of information provided. Mobile Crisis information also provided to pt.   Misbah Hornaday N Smart LCSW 05/16/2017, 3:11 PM

## 2017-05-16 NOTE — ED Notes (Signed)
Patient reports SI with a plan to walk in front of traffic. Patient denies HI/AVH at this this time. Plan of care discussed. Encouragement provided and safety maintain. Q 15 min safety checks remain in place and video monitoring.

## 2017-05-16 NOTE — BH Assessment (Signed)
[facility] Assessment Progress Note    [facility] to services of Dr. Jama Flavorsobos.  Dr. Rhunette CroftNanavati informed as well as nurse Rashell.  Clinician to complete voluntary admission paperwork with patient.

## 2017-05-16 NOTE — H&P (Addendum)
Psychiatric Admission Assessment Adult  Patient Identification: Rodney Watson  MRN:  [id number]  Date of Evaluation:  05/16/2017  Chief Complaint: Alcohol intoxication seeking detoxification treatment.  Principal Diagnosis: Alcohol abuse with alcohol-induced mood disorder ([place])  Diagnosis:   Patient Active Problem List   Diagnosis Date Noted  . Alcohol abuse with alcohol-induced mood disorder ([place]) [F10.14] 04/10/2017    Priority: High  . MDD (major depressive disorder), recurrent episode, severe ([place]) [F33.2] 04/13/2017  . Alcohol abuse [F10.10] 04/01/2017   History of Present Illness: This is one of several admission assessments for this 52 year old Caucasian male with hx of chronic alcoholism. He is known in this [facility] from previous hospitalizations. He was discharged from this [facility] hospital twice in this past February, 2019 after alcohol detoxification & mood stabilization treatments. He was discharged to follow-up care at the [facility] here in [place], [place]. Patient is currently being re-admitted from the [facility] ED with complaints of alcohol intoxication seeking detoxification treatment.  During this assessment, Rodney Watson presents shaky with flushed facial expression & some lacerations to his nasal area. He reports, "My pack team took me to the [facility] clinic yesterday because I expressed interest in getting detox treatment & quitting alcohol for good. I have been drinking heavily for many years. I started drinking alcohol at age 61 & I never stopped. It just got worse. I had a seizure activities 3-4 days ago, I fell & busted my nose. This happened after I had just finished drinking heavily & had a seizure. I should not be drinking alcohol because I have epilepsy. I have not been taking my seizure medicines except when I come to this hospital. I have just enrolled in the Pack Team. This team will be checking on me from here on & helping me to stay clean & take my medicines. I  drink about 6 bottles of the 40 ounce beer daily. I have not taken any medicines since I left this hospital few weeks ago. I am homeless. I was diagnosed with Bipolar disorder in 2009 at the [facility]. Please do not give me Trazodone, it does not work, but, Seroquel has helped me in the past. It worked well on my mood symptoms as well".  Associated Signs/Symptoms:  Depression Symptoms:  depressed mood, insomnia, hopelessness, anxiety,  (Hypo) Manic Symptoms:  Impulsivity, Labiality of Mood,  Anxiety Symptoms:  Excessive Worry, Social Anxiety,  Psychotic Symptoms:  Denies any hallucinations, delusional thoughts or paranoia.  PTSD Symptoms:"Yes, I suffered a lot of childhood trauma". Re-experiencing:  Flashbacks Intrusive Thoughts  Total Time spent with patient: 1 hour  Past Psychiatric History: Long history of mental illness as above. Diagnosed with Manic depression 2009. Was on Prozac. He has not taken his medication since his last discharge [facility]. Poor compliance with treatment recommendations. Reports past history of accidental suicidal behavior. No past history of violent behavior. He has been in rehabilitation treatment centters on multiple occassions over the years. He has tried AA in the past. Does not have a sponsor. Recently enrolled in the Pack team program.  Is the patient at risk to self? No.  Has the patient been a risk to self in the past 6 months? No.  Has the patient been a risk to self within the distant past? Yes.    Is the patient a risk to others? No.  Has the patient been a risk to others in the past 6 months? No.  Has the patient been a risk to  others within the distant past? No.   Prior Inpatient Therapy: Yes: ([place] x 2 previously)    Prior Outpatient Therapy: Yes Rodney Watson).  Alcohol Screening: 1. How often do you have a drink containing alcohol?: 4 or more times a week 2. How many drinks containing alcohol do you have on a typical day when you are  drinking?: 5 or 6 3. How often do you have six or more drinks on one occasion?: Daily or almost daily AUDIT-C Score: 10 4. How often during the last year have you found that you were not able to stop drinking once you had started?: Daily or almost daily 5. How often during the last year have you failed to do what was normally expected from you becasue of drinking?: Monthly 6. How often during the last year have you needed a first drink in the morning to get yourself going after a heavy drinking session?: Daily or almost daily 7. How often during the last year have you had a feeling of guilt of remorse after drinking?: Daily or almost daily 8. How often during the last year have you been unable to remember what happened the night before because you had been drinking?: Monthly 9. Have you or someone else been injured as a result of your drinking?: Yes, during the last year 10. Has a relative or friend or a doctor or another health worker been concerned about your drinking or suggested you cut down?: Yes, during the last year Alcohol Use Disorder Identification Test Final Score (AUDIT): 34 Intervention/Follow-up: Alcohol Education Substance Abuse History in the last 12 months:  Yes.     Consequences of Substance Abuse: Informed & discussed with patient. Medical Consequences:  Liver damage, Possible death by overdose Legal Consequences:  Arrests, jail time, Loss of driving privilege. Family Consequences:  Family discord, divorce and or separation.  Previous Psychotropic Medications: Yes (Prozac, Trazodone, Seroquel).  Psychological Evaluations: No  Past Medical History:  Past Medical History:  Diagnosis Date  . Alcohol abuse   . COPD (chronic obstructive pulmonary disease) ([place])   . Depression   . ETOH abuse   . Seizures ([place])    History reviewed. No pertinent surgical history.  Family History: History reviewed. No pertinent family history. Family Psychiatric  History: " I don't know,  all my family are dead".  Tobacco Screening: Have you used any form of tobacco in the last 30 days? (Cigarettes, Smokeless Tobacco, Cigars, and/or Pipes): Yes Tobacco use, Select all that apply: 5 or more cigarettes per day Are you interested in Tobacco Cessation Medications?: No, patient refused Counseled patient on smoking cessation including recognizing danger situations, developing coping skills and basic information about quitting provided: Refused/Declined practical counseling  Social History: Single, no children, unemployed, disabled, collects SSI. Homeless in [place], area. Hx. Seizure disorder. Social History   Substance and Sexual Activity  Alcohol Use Yes   Comment: 5th daily     Social History   Substance and Sexual Activity  Drug Use Yes  . Types: Marijuana   Comment: 1-2 joints/week    Additional Social History: Pain Medications: None Prescriptions: Supposed to be on gabapentin, prozac and dilantin.  Three weeks w/o dilantin. Over the Counter: None History of alcohol / drug use?: Yes Longest period of sobriety (when/how long): patient states that he was clean and sober on one occasion for eight months while intoxicated. Negative Consequences of Use: Personal relationships, Financial Withdrawal Symptoms: Tremors, Nausea / Vomiting, Fever / Chills, Sweats, Diarrhea, Patient aware of  relationship between substance abuse and physical/medical complications, Weakness, Seizures Onset of Seizures: Pt says he has epilepsy. Date of most recent seizure: Pt says last seizure was 2-3 days ago.   Name of Substance 2: marijuana 2 - Age of First Use: Teens 2 - Amount (size/oz): varies 2 - Frequency: Maybe once in a week. 2 - Duration: off and on 2 - Last Use / Amount: 4-5 days ago.  Allergies:   Allergies  Allergen Reactions  . Chocolate   . Chocolate Hives  . Vicodin [Hydrocodone-Acetaminophen] Nausea And Vomiting   Lab Results: No results found for this or any  previous visit (from the past 48 hour(s)).  Blood Alcohol level:  Lab Results  Component Value Date   ETH 200 (H) 05/15/2017   ETH 307 (HH) 64/40/3474   Metabolic Disorder Labs:  No results found for: HGBA1C, MPG No results found for: PROLACTIN No results found for: CHOL, TRIG, HDL, CHOLHDL, VLDL, LDLCALC  Current Medications: Current Facility-Administered Medications  Medication Dose Route Frequency Provider Last Rate Last Dose  . alum & mag hydroxide-simeth (MAALOX/MYLANTA) 200-200-20 MG/5ML suspension 30 mL  30 mL Oral Q4H PRN Laverle Hobby, PA-C      . FLUoxetine (PROZAC) capsule 20 mg  20 mg Oral Daily Laverle Hobby, PA-C   20 mg at 05/16/17 2595  . gabapentin (NEURONTIN) capsule 300 mg  300 mg Oral BID Laverle Hobby, PA-C   300 mg at 05/16/17 6387  . hydrOXYzine (ATARAX/VISTARIL) tablet 25 mg  25 mg Oral Q6H PRN Laverle Hobby, PA-C      . lisinopril (PRINIVIL,ZESTRIL) tablet 10 mg  10 mg Oral Daily Laverle Hobby, PA-C   10 mg at 05/16/17 5643  . loperamide (IMODIUM) capsule 2-4 mg  2-4 mg Oral PRN Laverle Hobby, PA-C      . LORazepam (ATIVAN) tablet 1 mg  1 mg Oral Q6H PRN Laverle Hobby, PA-C      . LORazepam (ATIVAN) tablet 1 mg  1 mg Oral QID Patriciaann Clan E, PA-C   1 mg at 05/16/17 1107   Followed by  . [START ON 05/17/2017] LORazepam (ATIVAN) tablet 1 mg  1 mg Oral TID Laverle Hobby, PA-C       Followed by  . [START ON 05/18/2017] LORazepam (ATIVAN) tablet 1 mg  1 mg Oral BID Patriciaann Clan E, PA-C       Followed by  . [START ON 05/19/2017] LORazepam (ATIVAN) tablet 1 mg  1 mg Oral Daily Simon, Spencer E, PA-C      . magnesium hydroxide (MILK OF MAGNESIA) suspension 30 mL  30 mL Oral Daily PRN Patriciaann Clan E, PA-C      . mirtazapine (REMERON) tablet 15 mg  15 mg Oral QHS Nwoko, Agnes I, NP      . multivitamin with minerals tablet 1 tablet  1 tablet Oral Daily Laverle Hobby, PA-C   1 tablet at 05/16/17 3295  . nicotine polacrilex (NICORETTE) gum 2  mg  2 mg Oral PRN Sharma Covert, MD   2 mg at 05/16/17 1107  . ondansetron (ZOFRAN-ODT) disintegrating tablet 4 mg  4 mg Oral Q6H PRN Laverle Hobby, PA-C      . phenytoin (DILANTIN) ER capsule 100 mg  100 mg Oral TID Patriciaann Clan E, PA-C   100 mg at 05/16/17 1107  . thiamine (B-1) injection 100 mg  100 mg Intramuscular Once Laverle Hobby, PA-C      . [  START ON 05/17/2017] thiamine (VITAMIN B-1) tablet 100 mg  100 mg Oral Daily Simon, Spencer E, PA-C      . traZODone (DESYREL) tablet 100 mg  100 mg Oral QHS,MR X 1 Simon, Spencer E, PA-C       PTA Medications: Medications Prior to Admission  Medication Sig Dispense Refill Last Dose  . acamprosate (CAMPRAL) 333 MG tablet Take 2 tablets (666 mg total) by mouth 3 (three) times daily with meals. For alcoholism 180 tablet 0 Past Month at Unknown time  . cloNIDine (CATAPRES) 0.1 MG tablet Take 0.1 mg by mouth daily.   Past Month at Unknown time  . FLUoxetine (PROZAC) 10 MG capsule Take 1 capsule (10 mg total) by mouth daily. For depression 30 capsule 0 Past Month at Unknown time  . gabapentin (NEURONTIN) 300 MG capsule Take 1 capsule (300 mg total) by mouth 3 (three) times daily. For agitation 90 capsule 0 Past Month at Unknown time  . gabapentin (NEURONTIN) 300 MG capsule Take 1 capsule (300 mg total) by mouth 3 (three) times daily. 90 capsule 0 Past Month at Unknown time  . hydrOXYzine (ATARAX/VISTARIL) 25 MG tablet Take 1 tablet (25 mg) by mouth four times daily as needed: For aniety 60 tablet 0 Past Month at Unknown time  . lisinopril (PRINIVIL,ZESTRIL) 10 MG tablet Take 1 tablet (10 mg total) by mouth daily. For high blood pressure 30 tablet 0 Past Month at Unknown time  . mirtazapine (REMERON) 15 MG tablet Take 1 tablet (15 mg total) by mouth at bedtime. For depression/sleep 30 tablet 0 Past Month at Unknown time  . phenytoin (DILANTIN) 100 MG ER capsule Take 1 capsule (100 mg total) by mouth 3 (three) times daily. For seizure activities  (Patient not taking: Reported on 05/15/2017) 60 capsule 0 Not Taking at Unknown time  . phenytoin (DILANTIN) 100 MG ER capsule Take 200 mg by mouth 2 (two) times daily.   Past Month at Unknown time  . traZODone (DESYREL) 100 MG tablet Take 1 tablet (100 mg total) by mouth at bedtime as needed for sleep. 30 tablet 0 Past Month at Unknown time  . triamcinolone cream (KENALOG) 0.1 % Apply 1 application topically 3 (three) times daily. For rashes 30 g 0 Past Week at Unknown time    Musculoskeletal: Strength & Muscle Tone: within normal limits Gait & Station: normal Patient leans: N/A  Psychiatric Specialty Exam: Physical Exam  Constitutional: He appears well-developed.  HENT:  Head: Normocephalic.  Eyes: Pupils are equal, round, and reactive to light.  Neck: Normal range of motion.  Cardiovascular: Normal rate.  Respiratory: Effort normal.  GI: Soft.  Genitourinary:  Genitourinary Comments: Deferred  Musculoskeletal: Normal range of motion.  Neurological: He is alert.  Skin: Skin is warm.    Review of Systems  Constitutional: Negative for chills and malaise/fatigue.  HENT: Negative.   Eyes: Negative.   Respiratory: Negative.   Cardiovascular: Negative.   Gastrointestinal: Negative.   Genitourinary: Negative.   Musculoskeletal: Negative.   Skin: Negative.   Endo/Heme/Allergies: Negative.   Psychiatric/Behavioral: Positive for depression and substance abuse (BAL 200, UDS (+) for THC). Negative for memory loss. The patient is nervous/anxious and has insomnia.     Blood pressure 106/85, pulse (!) 111, temperature 98.9 F (37.2 C), temperature source Oral, resp. rate 18, height 5' 8"  (1.727 m), weight 60.8 kg (134 lb).Body mass index is 20.37 kg/m.  General Appearance: Disheveled, in a burgundy hospital scrub, some abrassions to nasal ares with  dried blood on them.  Eye Contact:  Good  Speech:  Clear and Coherent and Normal Rate  Volume:  Normal  Mood:  Anxious and Depressed   Affect:  Congruent and Flat  Thought Process:  Coherent, Goal Directed and Descriptions of Associations: Intact  Orientation:  Full (Time, Place, and Person)  Thought Content:  Logical and denies any hallucinations, delusions or paranoia  Suicidal Thoughts:  Currently denies any thoughts, plans or intent.  Homicidal Thoughts:  Denies  Memory:  Immediate;   Good Recent;   Good Remote;   Good  Judgement:  Good  Insight:  Present  Psychomotor Activity:  Restlessness and Tremor  Concentration:  Concentration: Good and Attention Span: Good  Recall:  Good  Fund of Knowledge:  Fair  Language:  Good  Akathisia:  Negative  Handed:  Right  AIMS (if indicated):     Assets:  Communication Skills Desire for Improvement Resilience  ADL's:  Intact  Cognition:  WNL  Sleep:  Number of Hours: 3.5   Treatment Plan Summary: Daily contact with patient to assess and evaluate symptoms and progress in treatment: See Md's SRA & treatment plan.  - Encourage group therapy participation  - See MAR and SRA for medication management  Observation Level/Precautions:  15 minute checks  Laboratory:  Reviewed  Psychotherapy: Group sessions, AA/NA meetings.  Medications:  See MAR  Consultations: As needed  Discharge Concerns: Safety, maintaining sobriety.  Estimated LOS: 2-4 days.  Other: Admit to the [facility] for Primary Diagnosis: Alcohol abuse with alcohol-induced mood disorder ([place])  Long Term Goal(s): Improvement in symptoms so as ready for discharge  Short Term Goals: Ability to disclose and discuss suicidal ideas and Ability to demonstrate self-control will improve  Physician Treatment Plan for Secondary Diagnosis: Principal Problem:   Alcohol abuse with alcohol-induced mood disorder ([place]) Active Problems:   MDD (major depressive disorder), recurrent episode, severe ([place])  Long Term Goal(s): Improvement in symptoms so as ready for discharge  Short Term Goals:  Ability to identify and develop effective coping behaviors will improve, Compliance with prescribed medications will improve and Ability to identify triggers associated with substance abuse/mental health issues will improve  I certify that inpatient services furnished can reasonably be expected to improve the patient's condition.    Lindell Spar, NP, PMHNP, FNP-BC 3/28/201911:28 AM   I have reviewed NP's Note, assessement, diagnosis and plan, and agree. I have also met with patient and completed suicide risk assessment.  Rodney Watson is a 52 y/o M with history of MDD and alcohol use disorder who was admitted voluntarily with worsening depression, alcohol use, and poor medication adherence. He has recent relevant history of 2 admissions to [facility] in 03/2017.  Upon initial presentation, pt shares, "Alcoholism brought me in. Now I'm with the PATH team from [facility] and they took me to [facility], and I said I wanted to detox, so they brought me here. I fell back into the same thing because I went back to the same crowd." Pt reports he was not adherent to his outpatient regimen after discharge. He has been drinking heavily and prior to his admission he shares that he had a seizure while walking on train tracks, and he fell and hit his face. He endorses depressed mood, anhedonia, guilty feelings, and low energy. He denies symptoms of mania, OCD, and PTSD. He denies illicit substance use aside from alcohol.  Discussed with patient about treatment options. He agrees to resume his  previous medications from discharge (gabapentin, prozac, and vistaril) with addition of seroquel. He has already been started on CIWA with ativan. He will discuss alcohol treatment options with the SW team, and he is open to residential treatment.   PLAN OF CARE:   -admit to inpatient psychiatry unit  -MDD, recurrent, severe             - Restart prozac 76m po qday             - Restart remeron 157mpo qhs  -Anxiety              - Restart gabapentin 30073mo BID  -Alcohol use, withdrawal             - Continue CIWA with ativan  - Seizure disorder             - Continue dilantin ER 100m51mD  - insomnia             - Continue trazodone 100mg44m (may repeat x1 prn)  - HTN             - lisinopril 10mg 86mDay  -Encourage participation in groups and therapeutic milieu  -Disposition planning will be ongoing    ChristMaris Berger

## 2017-05-16 NOTE — Progress Notes (Signed)
ADMISSION Note:  Pt on unit and ready for admit.  Pt sts he is here to detox so that he can go to [facility] after detox.  Pt sts he has some face pain from falling d/t seizure.  Pt sts he has one about every month.  Pt has toot problems with only 7 teeth in his entire mouth.  Pt is allergic to chocolate.  Pt denies si, hi and avh and contracts for safety.  Pt is previous admit 3 weeks ago and sts he fell off the wagon.  Pt was given 2 mg Ativan in ed d/t CIWA of 11. Pt is in room sleeping Pt room checked every 15 min for pt safety.
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
## 2017-05-16 NOTE — Progress Notes (Signed)
Nursing Progress Note [phone]  Data: Patient presents with anxious mood but is pleasant and cooperative with staff. Patient took morning medications without incident. Patient appears to have withdrawal symptoms including sweating, tremors and nausea. Patient states, "I know what to expect". Patient states goal for today is to "rest and feel better". Patient denies SI/HI/AVH or pain. Patient contracts for safety on the unit at this time. Patient denies concerns for writer this morning.   Action: Patient educated about and provided medication per provider's orders. Patient safety maintained with q15 min safety checks. High fall risk precautions in place. Emotional support given. 1:1 interaction and active listening provided. Patient encouraged to attend meals and groups. Labs, vital signs and patient behavior monitored throughout shift. Patient encouraged to work on treatment plan and goals.   Response: Patient receptive to interaction with nurse. Patient remains safe on the unit at this time. Patient is observed resting in his bed. Will continue to support and monitor.

## 2017-05-16 NOTE — Progress Notes (Signed)
Adult Psychoeducational Group Note  Date:  05/16/2017 Time:  4:00 PM  Group Topic/Focus:  Crisis Management:   The purpose of this group is to help patients create a crisis plan for use upon discharge or in the future, as needed.  Participation Level:  Active  Participation Quality:  Appropriate, Sharing  Affect:  Appropriate  Cognitive:  Appropriate and Oriented  Insight: Improving  Engagement in Group:  Developing/Improving  Modes of Intervention:  Discussion and Education  Additional Comments:  Patient participated in group and was appropriate on the topic of identifying what a crisis means to them and discussing coping skills.   Marchelle Folksmanda A Manjit Bufano 05/16/2017, 5:50 PM
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
## 2017-05-16 NOTE — [facility] Group Notes (Signed)
LCSW Group Therapy Note   05/16/2017 1:15pm   Type of Therapy and Topic:  Group Therapy:  Overcoming Obstacles   Participation Level:  Did Not Attend--pt invited. Chose to remain in bed.    Description of Group:    In this group patients will be encouraged to explore what they see as obstacles to their own wellness and recovery. They will be guided to discuss their thoughts, feelings, and behaviors related to these obstacles. The group will process together ways to cope with barriers, with attention given to specific choices patients can make. Each patient will be challenged to identify changes they are motivated to make in order to overcome their obstacles. This group will be process-oriented, with patients participating in exploration of their own experiences as well as giving and receiving support and challenge from other group members.   Therapeutic Goals: 1. Patient will identify personal and current obstacles as they relate to admission. 2. Patient will identify barriers that currently interfere with their wellness or overcoming obstacles.  3. Patient will identify feelings, thought process and behaviors related to these barriers. 4. Patient will identify two changes they are willing to make to overcome these obstacles:      Summary of Patient Progress   x   Therapeutic Modalities:   Cognitive Behavioral Therapy Solution Focused Therapy Motivational Interviewing Relapse Prevention Therapy  Ledell PeoplesHeather N Smart, LCSW 05/16/2017 2:19 PM

## 2017-05-16 NOTE — Tx Team (Signed)
Initial Treatment Plan 05/16/2017 5:03 AM Timoteo AceWilliam Smoot [id number]    PATIENT STRESSORS: Financial difficulties Substance abuse   PATIENT STRENGTHS: Average or above average intelligence Work skills   PATIENT IDENTIFIED PROBLEMS: Substance Abuse "I drink every day and have to stop"  Depression "I need to get my life tofether"                   DISCHARGE CRITERIA:  Ability to meet basic life and health needs Adequate post-discharge living arrangements Medical problems require only outpatient monitoring Motivation to continue treatment in a less acute level of care  PRELIMINARY DISCHARGE PLAN: Outpatient therapy Placement in alternative living arrangements  PATIENT/FAMILY INVOLVEMENT: This treatment plan has been presented to and reviewed with the patient, Timoteo AceWilliam Gutmann.  The patient has been given the opportunity to ask questions and make suggestions.  Sylvan CheeseSteven M Rickey Farrier, RN 05/16/2017, 5:03 AM

## 2017-05-16 NOTE — Progress Notes (Signed)
Per pt request, CSW spoke with his PATH counselor Roseanna Rainbowamisha Goddard regarding disposition plan. She stated that pt has been referred to [facility] and [facility]. [facility] [phone]) regarding pt requirements and possible bed availability. [facility] referral also made with pt's consent. Pt is requesting residential treatment at this time. Laneta Simmersamisha will visit with pt on Friday.  Trula SladeHeather Smart, MSW, LCSW Clinical Social Worker 05/16/2017 3:07 PM

## 2017-05-16 NOTE — ED Notes (Signed)
Pt transported to [facility] by [facility] transportation service for continuation of specialized care. Belongings given to driver after patient signed for them. Pt left in no acute distress. 

## 2017-05-16 NOTE — Plan of Care (Signed)
  Problem: Education: Goal: Knowledge of [facility] Education information/materials will improve Outcome: Progressing   Problem: Safety: Goal: Periods of time without injury will increase Outcome: Progressing   Comments: Patient has been educated about unit rules, behavioral expectations and [facility] Policies. Patient verbalizes understanding. Withdrawal symptoms managed with CIWA protocol, scheduled medications and vital sign checks. Patient contracts for safety on the unit.

## 2017-05-17 DIAGNOSIS — I10 Essential (primary) hypertension: Secondary | ICD-10-CM

## 2017-05-17 DIAGNOSIS — F1290 Cannabis use, unspecified, uncomplicated: Secondary | ICD-10-CM

## 2017-05-17 MED ORDER — QUETIAPINE FUMARATE 100 MG PO TABS
100.0000 mg | ORAL_TABLET | Freq: Every day | ORAL | Status: DC
  Administered 2017-05-17 – 2017-05-19 (×3): 100 mg via ORAL
  Filled 2017-05-17 (×5): qty 1

## 2017-05-17 NOTE — Progress Notes (Signed)
[facility]. CSW to call her back on Monday to check bed status. [facility] and was transferred to Reggie in admissions-voicemail left regarding this referral and what else was needed.   Trula SladeHeather Smart, MSW, LCSW Clinical Social Worker 05/17/2017 3:02 PM

## 2017-05-17 NOTE — Progress Notes (Signed)
Nursing Progress Note [phone]  Data: Patient presented with flat affect and irritable mood this morning. Patient was late to rise this morning but did get up for morning medications when prompted by Clinical research associatewriter. Patient CIWA score 7 this morning and was observed to have mild tremors, sweating, and anxiety. Patient reports passive SI that "comes and goes" but HI/AVH or pain. Patient contracts for safety on the unit at this time. Patient completed self-inventory sheet and rate depression, hopelessness, and anxiety 6,6,7 respectively. Patient reported his goal was to "get better". Throughout the day, patient's mood brightened. He was seen more visible this afternoon and attended groups. Patient requested Vistaril for "nerves" as he began making discharge plans. Patient CIWA decreased and was maintained with schedulaed Ativan. Patient has been pleasant and cooperative. Patient reports his [birth year] is tomorrow.  Action: Patient educated about and provided medication per provider's orders. Patient safety maintained with q15 min safety checks. High fall risk precautions in place. Emotional support given. 1:1 interaction and active listening provided. Patient encouraged to attend meals and groups. Labs, vital signs and patient behavior monitored throughout shift. Patient encouraged to work on treatment plan and goals.  Response: Patient remains safe on the unit at this time. Patient is visible in the milieu. Will continue to support and monitor.

## 2017-05-17 NOTE — Progress Notes (Signed)
Rodney Watson, Rodney Watson  05/17/2017 11:46 AM Rodney Watson   MRN:  [id number]  Subjective: Rodney Watson reports, "I'm not doing so good today. I'm trying to get through this withdrawal symptoms. I still got the shakes, diarrhea, depression, irritability & bad anxiety. I ain't bothering no body. I'm just lying down because I don't feel good. My pack team will be coming to see me today".  Rodney Watson is a 52 y/o M with history of MDD and alcohol use disorder who was admitted voluntarily with worsening depression, alcohol use, and poor medication adherence. He has recent relevant history of 2 admissions to [facility] in 03/2017. Today, 05-17-17, Rodney Watson is seen, chart reviewed. The chart findings discussed with the treatment team. Patient is alert, oriented x 3 & aware of situation. He is seen, lying down in his bed. He says he is not feeling too well today. He continues to endorse symptoms of depression & anxiety. He is complaining of alcohol withdrawal symptoms. He presents with a flushed face. He is taking & tolerating his medication regimen. He denies any SIHI, AVH, delusional thoughts or paranoia. He is not visible on the unit as much except when taking medications & going to the cafeteria to eat. He is encouraged group participation. He continues to require support. The social worker is working on the discharge disposition.  Principal Problem: Alcohol abuse with alcohol-induced mood disorder (HCC)  Diagnosis:   Patient Active Problem List   Diagnosis Date Noted  . Alcohol abuse with alcohol-induced mood disorder (HCC) [F10.14] 04/10/2017    Priority: High  . MDD (major depressive disorder), recurrent episode, severe (HCC) [F33.2] 04/13/2017  . Alcohol abuse [F10.10] 04/01/2017   Total Time spent with patient: 30 minutes  Past Psychiatric History: See H&P  Past Medical History:  Past Medical History:  Diagnosis Date  . Alcohol abuse   . COPD (chronic obstructive pulmonary disease) (HCC)   .  Depression   . ETOH abuse   . Seizures (HCC)    History reviewed. No pertinent surgical history.  Family History: History reviewed. No pertinent family history.  Family Psychiatric  History: See H&P  Social History:  Social History   Substance and Sexual Activity  Alcohol Use Yes   Comment: 5th daily     Social History   Substance and Sexual Activity  Drug Use Yes  . Types: Marijuana   Comment: 1-2 joints/week    Social History   Socioeconomic History  . Marital status: Single    Spouse name: Not on file  . Number of children: Not on file  . Years of education: Not on file  . Highest education level: Not on file  Occupational History  . Not on file  Social Needs  . Financial resource strain: Not on file  . Food insecurity:    Worry: Not on file    Inability: Not on file  . Transportation needs:    Medical: Not on file    Non-medical: Not on file  Tobacco Use  . Smoking status: Current Every Day Smoker    Packs/day: 1.50    Years: 39.00    Pack years: 58.50    Types: Cigarettes  . Smokeless tobacco: Current User    Types: Chew  Substance and Sexual Activity  . Alcohol use: Yes    Comment: 5th daily  . Drug use: Yes    Types: Marijuana    Comment: 1-2 joints/week  . Sexual activity: Never  Lifestyle  . Physical activity:  Days per week: Not on file    Minutes per session: Not on file  . Stress: Not on file  Relationships  . Social connections:    Talks on phone: Not on file    Gets together: Not on file    Attends religious service: Not on file    Active member of club or organization: Not on file    Attends meetings of clubs or organizations: Not on file    Relationship status: Not on file  Other Topics Concern  . Not on file  Social History Narrative   ** Merged History Encounter **       Additional Social History:    Pain Medications: None Prescriptions: Supposed to be on gabapentin, prozac and dilantin.  Three weeks w/o dilantin. Over  the Counter: None History of alcohol / drug use?: Yes Longest period of sobriety (when/how long): patient states that he was clean and sober on one occasion for eight months while intoxicated. Negative Consequences of Use: Personal relationships, Financial Withdrawal Symptoms: Tremors, Nausea / Vomiting, Fever / Chills, Sweats, Diarrhea, Patient aware of relationship between substance abuse and physical/medical complications, Weakness, Seizures Onset of Seizures: Pt says he has epilepsy. Date of most recent seizure: Pt says last seizure was 2-3 days ago.   Name of Substance 2: marijuana 2 - Age of First Use: Teens 2 - Amount (size/oz): varies 2 - Frequency: Maybe once in a week. 2 - Duration: off and on 2 - Last Use / Amount: 4-5 days ago.  Sleep: Fair  Appetite:  Fair  Current Medications: Current Facility-Administered Medications  Medication Dose Route Frequency Provider Last Rate Last Dose  . alum & mag hydroxide-simeth (MAALOX/MYLANTA) 200-200-20 MG/5ML suspension 30 mL  30 mL Oral Q4H PRN Kerry HoughSimon, Spencer E, PA-C      . FLUoxetine (PROZAC) capsule 20 mg  20 mg Oral Daily Donell SievertSimon, Spencer E, PA-C   20 mg at 05/17/17 0859  . gabapentin (NEURONTIN) capsule 300 mg  300 mg Oral BID Kerry HoughSimon, Spencer E, PA-C   300 mg at 05/17/17 [id number]  . hydrOXYzine (ATARAX/VISTARIL) tablet 25 mg  25 mg Oral Q6H PRN Kerry HoughSimon, Spencer E, PA-C      . lisinopril (PRINIVIL,ZESTRIL) tablet 10 mg  10 mg Oral Daily Donell SievertSimon, Spencer E, PA-C   10 mg at 05/17/17 0859  . loperamide (IMODIUM) capsule 2-4 mg  2-4 mg Oral PRN Kerry HoughSimon, Spencer E, PA-C      . LORazepam (ATIVAN) tablet 1 mg  1 mg Oral Q6H PRN Kerry HoughSimon, Spencer E, PA-C      . LORazepam (ATIVAN) tablet 1 mg  1 mg Oral TID Donell SievertSimon, Spencer E, PA-C   1 mg at 05/17/17 0859   Followed by  . [START ON 05/18/2017] LORazepam (ATIVAN) tablet 1 mg  1 mg Oral BID Donell SievertSimon, Spencer E, PA-C       Followed by  . [START ON 05/19/2017] LORazepam (ATIVAN) tablet 1 mg  1 mg Oral Daily Simon,  Spencer E, PA-C      . magnesium hydroxide (MILK OF MAGNESIA) suspension 30 mL  30 mL Oral Daily PRN Donell SievertSimon, Spencer E, PA-C      . mirtazapine (REMERON) tablet 15 mg  15 mg Oral QHS Kelvon Giannini I, NP   15 mg at 05/16/17 2215  . multivitamin with minerals tablet 1 tablet  1 tablet Oral Daily Kerry HoughSimon, Spencer E, PA-C   1 tablet at 05/17/17 0859  . nicotine polacrilex (NICORETTE) gum 2 mg  2  mg Oral PRN Antonieta Pert, MD   2 mg at 05/17/17 1015  . ondansetron (ZOFRAN-ODT) disintegrating tablet 4 mg  4 mg Oral Q6H PRN Kerry Hough, PA-C      . phenytoin (DILANTIN) ER capsule 100 mg  100 mg Oral TID Donell Sievert E, PA-C   100 mg at 05/17/17 0859  . thiamine (B-1) injection 100 mg  100 mg Intramuscular Once Donell Sievert E, PA-C      . thiamine (VITAMIN B-1) tablet 100 mg  100 mg Oral Daily Donell Sievert E, PA-C   100 mg at 05/17/17 0859  . traZODone (DESYREL) tablet 100 mg  100 mg Oral QHS,MR X 1 Donell Sievert E, PA-C   100 mg at 05/16/17 2216  . triamcinolone cream (KENALOG) 0.1 %   Topical BID Armandina Stammer I, NP       Lab Results: No results found for this or any previous visit (from the past 48 hour(s)).  Blood Alcohol level:  Lab Results  Component Value Date   ETH 200 (H) 05/15/2017   ETH 307 (HH) 05/11/2017   Metabolic Disorder Labs: No results found for: HGBA1C, MPG No results found for: PROLACTIN No results found for: CHOL, TRIG, HDL, CHOLHDL, VLDL, LDLCALC  Physical Findings: AIMS: Facial and Oral Movements Muscles of Facial Expression: None, normal Lips and Perioral Area: None, normal Jaw: None, normal Tongue: None, normal,Extremity Movements Upper (arms, wrists, hands, fingers): None, normal Lower (legs, knees, ankles, toes): None, normal, Trunk Movements Neck, shoulders, hips: None, normal, Overall Severity Severity of abnormal movements (highest score from questions above): None, normal Incapacitation due to abnormal movements: None, normal Patient's awareness  of abnormal movements (rate only patient's report): No Awareness, Dental Status Current problems with teeth and/or dentures?: No Does patient usually wear dentures?: No  CIWA:  CIWA-Ar Total: 7 COWS:     Musculoskeletal: Strength & Muscle Tone: within normal limits Gait & Station: normal Patient leans: N/A  Psychiatric Specialty Exam: Physical Exam  Nursing Watson and vitals reviewed.   Review of Systems  Psychiatric/Behavioral: Positive for depression and substance abuse. Negative for hallucinations, memory loss and suicidal ideas (Hx. alcoholism, chronic). The patient is nervous/anxious and has insomnia.     Blood pressure 116/83, pulse (!) 105, temperature 97.8 F (36.6 C), temperature source Oral, resp. rate 18, height 5\' 8"  (1.727 m), weight 60.8 kg (134 lb).Body mass index is 20.37 kg/m.  General Appearance: Casual and Disheveled, flushed face  Eye Contact:  Fair  Speech:  Clear and Coherent and Normal Rate  Volume:  Normal  Mood:  Anxious and Depressed  Affect:  Appropriate, Congruent and Constricted  Thought Process:  Coherent and Goal Directed  Orientation:  Full (Time, Place, and Person)  Thought Content:  Logical  Suicidal Thoughts:  No  Homicidal Thoughts:  No  Memory:  Immediate;   Fair Recent;   Fair Remote;   Fair  Judgement:  Poor  Insight:  Lacking  Psychomotor Activity:  Tremor  Concentration:  Concentration: Fair  Recall:  [employer] of Knowledge:  Fair  Language:  Fair  Akathisia:  No  Handed:    AIMS (if indicated):     Assets:  Resilience Social Support  ADL's:  Intact  Cognition:  WNL  Sleep:  Number of Hours: 5.25     Treatment Plan Summary: Daily contact with patient to assess and evaluate symptoms and progress in treatment: Patient is in bed. He continues to complain of feeling depressed. He  is still having alcohol withdrawal symptoms.    - Continue inpatient hospitalization.    -  Will continue today 05/17/2017 plan as below except  where it is noted.  Alcohol detox.    - Continue the Ativan detox protocols.  Depression.    - Continue Prozac 20 mg po daily.  Agitation/Anxiety disorder:    - Continue Gabapentin 300 mg po bid.    - Continue Hydroxyzine 25 mg po Q 6 hours prn.  Depression/insomnia:    - Continue Mirtazapine 15 mg po qhs prn.    - Disontinued Trazodone 100 mg po Q hs.  Mood control.    Will initiate Seroquel 100 mg po Q hs.  Seizure disorder:  - Continue Dilantin 100 mg po tid. HTN: Continue Lisinopril 10 mg po daily.          - Will obtain Dilantin levels on on 05-19-17.   Nicotine withdrawal.    - Continue Nicorette gum 2 mg po as needed.     - Encourage group & AA/NA meetings.    - Discharge disposition on going.  Armandina Stammer, NP, PMHNP, FNP-BC. 05/17/2017, 11:46 AM

## 2017-05-17 NOTE — Progress Notes (Signed)
Recreation Therapy Notes  Date: 3.29.19 Time: 9:30 a.m. Location: [address] Dayroom   Group Topic: Stress Management   Goal Area(s) Addresses:  Goal 1.1: To reduce stress  -Patient will report feeling a reduction in stress level  -Patient will identify the importance of stress management  -Patient will participate during stress management group treatment     Intervention: Stress Management   Activity: Meditation- Patients were in a peaceful environment with soft lighting enhancing patients mood. Patients listened to a mindfulness meditation voiceover to help reduce stress levels    Education: Stress Management, Discharge Planning.    Education Outcome: Acknowledges edcuation/In group clarification offered/Needs additional education   Clinical Observations/Feedback:: Patient did not attend     Sheryle HailDarian Tane Biegler, Recreation Therapy Intern   Sheryle HailDarian Kobie Matkins 05/17/2017 8:18 AM

## 2017-05-17 NOTE — Progress Notes (Signed)
Adult Psychoeducational Group Note  Date:  05/17/2017 Time:  4:00 PM  Group Topic/Focus: Open Discussion Led by two RNs, patients were encouraged to talk about what brought them to the hospital, what coping skills have helped them in the past, and bring up any concerns they had for staff. Staff opened the discussion for patients to discuss any topic.  Participation Level:  Minimal  Participation Quality:  None  Affect:  Flat  Cognitive:  Alert and Oriented  Insight: Lacking  Engagement in Group:  Developing/Improving, and Attentive  Modes of Intervention:  Clarification, Discussion, Exploration, Socialization and Support  Additional Comments: Patient shared his name with the group and listened to his peers but declined to share. Patient stayed for entirety of group.  Marchelle Folksmanda A Willard Madrigal 05/17/2017, 5:00 PM

## 2017-05-17 NOTE — Progress Notes (Signed)
Pt did not attend wrap-up group   

## 2017-05-17 NOTE — Progress Notes (Signed)
D    Pt is pleasant on approach but does endorse depression   He reports anxiety and not feeling very good about himself    He interacts well with others and his behavior is appropriate   He talked about the birthday cake he received today and said he enjoyed sharing it with the other patients  A    Verbal support given   Medications administered and effectiveness monitored   Q 15 min checks  R    Pt is safe at present time and receptive to verbal support

## 2017-05-17 NOTE — Tx Team (Signed)
Interdisciplinary Treatment and Diagnostic Plan Update  05/17/2017 Time of Session: [id number] Rodney Watson MRN: [id number]  Principal Diagnosis: Alcohol abuse with alcohol-induced mood disorder (HCC)  Secondary Diagnoses: Principal Problem:   Alcohol abuse with alcohol-induced mood disorder (HCC) Active Problems:   MDD (major depressive disorder), recurrent episode, severe (HCC)   Current Medications:  Current Facility-Administered Medications  Medication Dose Route Frequency Provider Last Rate Last Dose  . alum & mag hydroxide-simeth (MAALOX/MYLANTA) 200-200-20 MG/5ML suspension 30 mL  30 mL Oral Q4H PRN Kerry Hough, PA-C      . FLUoxetine (PROZAC) capsule 20 mg  20 mg Oral Daily Kerry Hough, PA-C   20 mg at 05/16/17 8657  . gabapentin (NEURONTIN) capsule 300 mg  300 mg Oral BID Kerry Hough, PA-C   300 mg at 05/16/17 1705  . hydrOXYzine (ATARAX/VISTARIL) tablet 25 mg  25 mg Oral Q6H PRN Kerry Hough, PA-C      . lisinopril (PRINIVIL,ZESTRIL) tablet 10 mg  10 mg Oral Daily Donell Sievert E, PA-C   10 mg at 05/16/17 8469  . loperamide (IMODIUM) capsule 2-4 mg  2-4 mg Oral PRN Kerry Hough, PA-C      . LORazepam (ATIVAN) tablet 1 mg  1 mg Oral Q6H PRN Kerry Hough, PA-C      . LORazepam (ATIVAN) tablet 1 mg  1 mg Oral TID Kerry Hough, PA-C       Followed by  . [START ON 05/18/2017] LORazepam (ATIVAN) tablet 1 mg  1 mg Oral BID Donell Sievert E, PA-C       Followed by  . [START ON 05/19/2017] LORazepam (ATIVAN) tablet 1 mg  1 mg Oral Daily Simon, Spencer E, PA-C      . magnesium hydroxide (MILK OF MAGNESIA) suspension 30 mL  30 mL Oral Daily PRN Donell Sievert E, PA-C      . mirtazapine (REMERON) tablet 15 mg  15 mg Oral QHS Nwoko, Agnes I, NP   15 mg at 05/16/17 2215  . multivitamin with minerals tablet 1 tablet  1 tablet Oral Daily Kerry Hough, PA-C   1 tablet at 05/16/17 6295  . nicotine polacrilex (NICORETTE) gum 2 mg  2 mg Oral PRN Antonieta Pert,  MD   2 mg at 05/16/17 1602  . ondansetron (ZOFRAN-ODT) disintegrating tablet 4 mg  4 mg Oral Q6H PRN Kerry Hough, PA-C      . phenytoin (DILANTIN) ER capsule 100 mg  100 mg Oral TID Donell Sievert E, PA-C   100 mg at 05/16/17 1705  . thiamine (B-1) injection 100 mg  100 mg Intramuscular Once Donell Sievert E, PA-C      . thiamine (VITAMIN B-1) tablet 100 mg  100 mg Oral Daily Simon, Spencer E, PA-C      . traZODone (DESYREL) tablet 100 mg  100 mg Oral QHS,MR X 1 Donell Sievert E, PA-C   100 mg at 05/16/17 2216  . triamcinolone cream (KENALOG) 0.1 %   Topical BID Armandina Stammer I, NP       PTA Medications: Medications Prior to Admission  Medication Sig Dispense Refill Last Dose  . acamprosate (CAMPRAL) 333 MG tablet Take 2 tablets (666 mg total) by mouth 3 (three) times daily with meals. For alcoholism 180 tablet 0 Past Month at Unknown time  . cloNIDine (CATAPRES) 0.1 MG tablet Take 0.1 mg by mouth daily.   Past Month at Unknown time  . FLUoxetine (PROZAC) 10 MG  capsule Take 1 capsule (10 mg total) by mouth daily. For depression 30 capsule 0 Past Month at Unknown time  . gabapentin (NEURONTIN) 300 MG capsule Take 1 capsule (300 mg total) by mouth 3 (three) times daily. For agitation 90 capsule 0 Past Month at Unknown time  . gabapentin (NEURONTIN) 300 MG capsule Take 1 capsule (300 mg total) by mouth 3 (three) times daily. 90 capsule 0 Past Month at Unknown time  . hydrOXYzine (ATARAX/VISTARIL) 25 MG tablet Take 1 tablet (25 mg) by mouth four times daily as needed: For aniety 60 tablet 0 Past Month at Unknown time  . lisinopril (PRINIVIL,ZESTRIL) 10 MG tablet Take 1 tablet (10 mg total) by mouth daily. For high blood pressure 30 tablet 0 Past Month at Unknown time  . mirtazapine (REMERON) 15 MG tablet Take 1 tablet (15 mg total) by mouth at bedtime. For depression/sleep 30 tablet 0 Past Month at Unknown time  . phenytoin (DILANTIN) 100 MG ER capsule Take 1 capsule (100 mg total) by mouth 3  (three) times daily. For seizure activities (Patient not taking: Reported on 05/15/2017) 60 capsule 0 Not Taking at Unknown time  . phenytoin (DILANTIN) 100 MG ER capsule Take 200 mg by mouth 2 (two) times daily.   Past Month at Unknown time  . traZODone (DESYREL) 100 MG tablet Take 1 tablet (100 mg total) by mouth at bedtime as needed for sleep. 30 tablet 0 Past Month at Unknown time  . triamcinolone cream (KENALOG) 0.1 % Apply 1 application topically 3 (three) times daily. For rashes 30 g 0 Past Week at Unknown time    Patient Stressors: Financial difficulties Substance abuse  Patient Strengths: Average or above average intelligence Work skills  Treatment Modalities: Medication Management, Group therapy, Case management,  1 to 1 session with clinician, Psychoeducation, Recreational therapy.   Physician Treatment Plan for Primary Diagnosis: Alcohol abuse with alcohol-induced mood disorder (HCC) Long Term Goal(s): Improvement in symptoms so as ready for discharge Improvement in symptoms so as ready for discharge   Short Term Goals: Ability to disclose and discuss suicidal ideas Ability to demonstrate self-control will improve Ability to identify and develop effective coping behaviors will improve Compliance with prescribed medications will improve Ability to identify triggers associated with substance abuse/mental health issues will improve  Medication Management: Evaluate patient's response, side effects, and tolerance of medication regimen.  Therapeutic Interventions: 1 to 1 sessions, Unit Group sessions and Medication administration.  Evaluation of Outcomes: Progressing  Physician Treatment Plan for Secondary Diagnosis: Principal Problem:   Alcohol abuse with alcohol-induced mood disorder (HCC) Active Problems:   MDD (major depressive disorder), recurrent episode, severe (HCC)  Long Term Goal(s): Improvement in symptoms so as ready for discharge Improvement in symptoms so as  ready for discharge   Short Term Goals: Ability to disclose and discuss suicidal ideas Ability to demonstrate self-control will improve Ability to identify and develop effective coping behaviors will improve Compliance with prescribed medications will improve Ability to identify triggers associated with substance abuse/mental health issues will improve     Medication Management: Evaluate patient's response, side effects, and tolerance of medication regimen.  Therapeutic Interventions: 1 to 1 sessions, Unit Group sessions and Medication administration.  Evaluation of Outcomes: Progressing   RN Treatment Plan for Primary Diagnosis: Alcohol abuse with alcohol-induced mood disorder (HCC) Long Term Goal(s): Knowledge of disease and therapeutic regimen to maintain health will improve  Short Term Goals: Ability to remain free from injury will improve, Ability to disclose  and discuss suicidal ideas and Ability to identify and develop effective coping behaviors will improve  Medication Management: RN will administer medications as ordered by provider, will assess and evaluate patient's response and provide education to patient for prescribed medication. RN will report any adverse and/or side effects to prescribing provider.  Therapeutic Interventions: 1 on 1 counseling sessions, Psychoeducation, Medication administration, Evaluate responses to treatment, Monitor vital signs and CBGs as ordered, Perform/monitor CIWA, COWS, AIMS and Fall Risk screenings as ordered, Perform wound care treatments as ordered.  Evaluation of Outcomes: Progressing   LCSW Treatment Plan for Primary Diagnosis: Alcohol abuse with alcohol-induced mood disorder (HCC) Long Term Goal(s): Safe transition to appropriate next level of care at discharge, Engage patient in therapeutic group addressing interpersonal concerns.  Short Term Goals: Engage patient in aftercare planning with referrals and resources, Facilitate patient  progression through stages of change regarding substance use diagnoses and concerns and Identify triggers associated with mental health/substance abuse issues  Therapeutic Interventions: Assess for all discharge needs, 1 to 1 time with Social worker, Explore available resources and support systems, Assess for adequacy in community support network, Educate family and significant other(s) on suicide prevention, Complete Psychosocial Assessment, Interpersonal group therapy.  Evaluation of Outcomes: Progressing   Progress in Treatment: Attending groups: Yes. Participating in groups: Yes. Taking medication as prescribed: Yes. Toleration medication: Yes. Family/Significant other contact made: SPE completed with pt; pt declined to consent to family contact.  Patient understands diagnosis: Yes. Discussing patient identified problems/goals with staff: Yes. Medical problems stabilized or resolved: Yes. Denies suicidal/homicidal ideation: Yes. Issues/concerns per patient self-inventory: No. Other: n/a   New problem(s) identified: No, Describe:  n/a  New Short Term/Long Term Goal(s): detox, medication management for mood stabilization; elimination of SI thoughts; development of comprehensive mental wellness/sobriety plan.   Patient goal: "to get detoxed and find longer term treatment."   Discharge Plan or Barriers: CSW assessing for appropriate referrals. Pt is interested in residential treatment. [facility] and [facility] referrals made. CSW also contacted pt's [facility] counselor for assistance in aftercare planning.   Reason for Continuation of Hospitalization: Anxiety Depression Medication stabilization Withdrawal symptoms  Estimated Length of Stay: Tuesday, 05/21/17  Attendees: Patient: Rodney Watson 05/17/2017 8:32 AM  Physician: Dr. Altamese Meta MD; Dr. Jola Babinski MD 05/17/2017 8:32 AM  Nursing: Marchelle Folks RN; Erskine Squibb RN; Patty RN 05/17/2017 8:32 AM  RN Care Manager:x 05/17/2017 8:32 AM  Social Worker: Herbalist, LCSW 05/17/2017 8:32 AM  Recreational Therapist: x 05/17/2017 8:32 AM  Other: Armandina Stammer NP; Feliz Beam Money NP 05/17/2017 8:32 AM  Other:  05/17/2017 8:32 AM  Other: 05/17/2017 8:32 AM    Scribe for Treatment Team: Ledell Peoples Smart, LCSW 05/17/2017 8:32 AM

## 2017-05-17 NOTE — Plan of Care (Signed)
Problem: Activity: Goal: Interest or engagement in activities will improve Intervention: Patient has been provided a daily scheduled and is encouraged by staff to attend groups. Outcome: Patient is visible on the unit, interactive with peers/staff and attending groups. 05/17/2017 3:07 PM - Progressing by Ferrel Loganollazo, Vyncent Overby A, RN   Problem: Health Behavior/Discharge Planning: Goal: Compliance with treatment plan for underlying cause of condition will improve Intervention: Patient encouraged to take medications as prescribed and attend groups. Patient encouraged to be active in their recovery. Outcome: Patient is attending groups, taking medications as prescribed and complying with goals of treatment. 05/17/2017 3:07 PM - Progressing by Ferrel Loganollazo, Meredith Mells A, RN   Problem: Physical Regulation: Goal: Ability to maintain clinical measurements within normal limits will improve Intervention: Patient monitored with CIWA/COW assessments and provided medications for withdrawal. Outcome: Patient withdrawal symptoms are minimal and CIWA/COW scores remain low. 05/17/2017 3:07 PM - Progressing by Ferrel Loganollazo, Alayasia Breeding A, RN   Problem: Safety: Goal: Periods of time without injury will increase Intervention: Patient contracts for safety on the unit. High fall risk precautions in place. Safety monitored with q15 minute checks. Outcome: Patient remains safe on the unit at this time. 05/17/2017 3:07 PM - Progressing by Ferrel Loganollazo, Diane Hanel A, RN

## 2017-05-17 NOTE — Progress Notes (Signed)
Pt has been in his room in bed all evening.  Writer was able to wake him briefly at the beginning of the shift to ask assessment questions which he answered, but he was irritable at being awakened.  He denies SI/HI/AVH.  He is having significant withdrawal symptoms at this time.  By the time writer took his hs meds to him, he was very tremulous and stated he did not feel well.  He took his meds and writer encouraged him to drink gatorade as much as possible throughout the night.  Pt voiced understanding.  Support and encouragement offered.  Discharge plans are in process.  He would like long term treatment after detox.  Safety maintained with q15 minute checks.

## 2017-05-17 NOTE — Progress Notes (Signed)
LCSW Group Therapy Note  05/17/2017 1:15pm  Type of Therapy and Topic:  Group Therapy:  Feelings around Relapse and Recovery  Participation Level:  Active   Description of Group:    Patients in this group will discuss emotions they experience before and after a relapse. They will process how experiencing these feelings, or avoidance of experiencing them, relates to having a relapse. Facilitator will guide patients to explore emotions they have related to recovery. Patients will be encouraged to process which emotions are more powerful. They will be guided to discuss the emotional reaction significant others in their lives may have to their relapse or recovery. Patients will be assisted in exploring ways to respond to the emotions of others without this contributing to a relapse.  Therapeutic Goals: 1. Patient will identify two or more emotions that lead to a relapse for them 2. Patient will identify two emotions that result when they relapse 3. Patient will identify two emotions related to recovery 4. Patient will demonstrate ability to communicate their needs through discussion and/or role plays   Summary of Patient Progress:  Rodney Watson was attentive and engaged during today's processing group. He shared that he is happy to be sober on his 52rd birthday and is planning to "try something different" and attend residential treatment. Rodney Watson continues to show progress in the group setting with improving insight.    Therapeutic Modalities:   Cognitive Behavioral Therapy Solution-Focused Therapy Assertiveness Training Relapse Prevention Therapy   Ledell PeoplesHeather N Smart, LCSW 05/17/2017 2:30 PM

## 2017-05-18 NOTE — Progress Notes (Signed)
Psychoeducational Group Note  Date:  05/18/2017 Time: 2100  Group Topic/Focus:  wrap up group  Participation Level: Did Not Attend  Participation Quality:  Not Applicable  Affect:  Not Applicable  Cognitive:  Not Applicable  Insight:  Not Applicable  Engagement in Group: Not Applicable  Additional Comments:  Pt was notified that group was beginning but returned to his room.   Johann CapersMcNeil, Delbert Vu S 05/18/2017, 10:03 PM

## 2017-05-18 NOTE — [facility] Group Notes (Signed)
[facility] Group Notes:  (Nursing/MHT/Case Management/Adjunct)  Date:  05/18/2017  Time:  4:05 PM  Type of Therapy:  Psychoeducational Skills  Participation Level:  Did Not Attend  Participation Quality:  Did Not Attend   Affect:  Did Not Attend   Cognitive:  Did Not Attend   Insight:  Did Not Attend   Engagement in Group: Did Not Attend   Modes of Intervention:  Did Not Attend   Summary of Progress/Problems: Pt did not attend anger management group.  Sherryl MangesWesseh, Brenae Lasecki 05/18/2017, 4:05 PM

## 2017-05-18 NOTE — [facility] Group Notes (Signed)
[facility] Group Notes:  (Nursing)  Date:  05/18/2017  Time:  1:15 pm Type of Therapy:  Nurse Education  Participation Level:  Did Not Attend  Participation Quality:  Did not attend  Affect:  Did not attend  Cognitive:  Did not attend  Insight:  None  Engagement in Group:  None  Modes of Intervention:  Discussion, Education and Exploration  Summary of Progress/Problems: Patient did not attend group  Shela NevinValerie S Masaye Gatchalian 05/18/2017, 2:11 PM

## 2017-05-18 NOTE — Progress Notes (Signed)
D: Pt A & O X3. Denies SI, HI, AVH and pain. Presents irritable / agitated on initial approach at medication window this morning. Brightened up this afternoon and was more engaged with Clinical research associatewriter and peers. Per pt "I'm sorry nurse for being rude to you this morning; I was just pissed about spending my 52rd birthday in here". Stated to Clinical research associatewriter "I feel better because there's nothing I can do about it right now". Reports he's eating and sleeping well. Denies concerns at this time. A: Emotional support and encouragement provided to pt. Scheduled and PRN medications given per MD's order with verbal education and effects monitored. Safety checks maintained without self harm gestures.  R: Pt has been compliant with current medication regimen. Denies adverse drug reactions when assessed. Pt did not attend unit groups despite multiple encouragements / prompts. Off unit for meals and acitivities with peers and staff, returned without issues. POC maintained for safety and mood stability.

## 2017-05-18 NOTE — [facility] Group Notes (Signed)
[facility] Group Notes: (Clinical Social Work)   05/18/2017      Type of Therapy:  Group Therapy   Participation Level:  Did Not Attend despite MHT prompting   Norleen Xie Grossman-Orr, LCSW 05/18/2017, 1:14 PM     

## 2017-05-18 NOTE — Progress Notes (Signed)
[facility] MD Progress Note  05/18/2017 11:36 AM Rodney Watson  MRN:  [id number]   Subjective:  Patient reports that he is feeling better today, but still has some withdrawal symptoms and depression and anxiety. He rates his depression at 3/10 and anxiety at 5/10. He reports sleeping fair but mainly due to withdrawals. He is still hoping to get into a residential program. He denies any SI/HI/AVH and contracts for safety.  Objective: patient's chart and findings reviewed and discussed with treatment team. Patient presents in his bed. He is pleasant and cooperative. He has been isolative some, but has been seen in the day room and attending some groups. CSW reports [facility] is reviewing his information and [facility] requested a call back on Monday for bed availability.   Principal Problem: Alcohol abuse with alcohol-induced mood disorder (HCC) Diagnosis:   Patient Active Problem List   Diagnosis Date Noted  . MDD (major depressive disorder), recurrent episode, severe (HCC) [F33.2] 04/13/2017  . Alcohol abuse with alcohol-induced mood disorder (HCC) [F10.14] 04/10/2017  . Alcohol abuse [F10.10] 04/01/2017   Total Time spent with patient: 15 minutes  Past Psychiatric History: See H&P  Past Medical History:  Past Medical History:  Diagnosis Date  . Alcohol abuse   . COPD (chronic obstructive pulmonary disease) (HCC)   . Depression   . ETOH abuse   . Seizures (HCC)    History reviewed. No pertinent surgical history. Family History: History reviewed. No pertinent family history. Family Psychiatric  History: See H&P Social History:  Social History   Substance and Sexual Activity  Alcohol Use Yes   Comment: 5th daily     Social History   Substance and Sexual Activity  Drug Use Yes  . Types: Marijuana   Comment: 1-2 joints/week    Social History   Socioeconomic History  . Marital status: Single    Spouse name: Not on file  . Number of children: Not on file  . Years of education: Not  on file  . Highest education level: Not on file  Occupational History  . Not on file  Social Needs  . Financial resource strain: Not on file  . Food insecurity:    Worry: Not on file    Inability: Not on file  . Transportation needs:    Medical: Not on file    Non-medical: Not on file  Tobacco Use  . Smoking status: Current Every Day Smoker    Packs/day: 1.50    Years: 39.00    Pack years: 58.50    Types: Cigarettes  . Smokeless tobacco: Current User    Types: Chew  Substance and Sexual Activity  . Alcohol use: Yes    Comment: 5th daily  . Drug use: Yes    Types: Marijuana    Comment: 1-2 joints/week  . Sexual activity: Never  Lifestyle  . Physical activity:    Days per week: Not on file    Minutes per session: Not on file  . Stress: Not on file  Relationships  . Social connections:    Talks on phone: Not on file    Gets together: Not on file    Attends religious service: Not on file    Active member of club or organization: Not on file    Attends meetings of clubs or organizations: Not on file    Relationship status: Not on file  Other Topics Concern  . Not on file  Social History Narrative   ** Merged History Encounter **  Additional Social History:    Pain Medications: None Prescriptions: Supposed to be on gabapentin, prozac and dilantin.  Three weeks w/o dilantin. Over the Counter: None History of alcohol / drug use?: Yes Longest period of sobriety (when/how long): patient states that he was clean and sober on one occasion for eight months while intoxicated. Negative Consequences of Use: Personal relationships, Financial Withdrawal Symptoms: Tremors, Nausea / Vomiting, Fever / Chills, Sweats, Diarrhea, Patient aware of relationship between substance abuse and physical/medical complications, Weakness, Seizures Onset of Seizures: Pt says he has epilepsy. Date of most recent seizure: Pt says last seizure was 2-3 days ago.   Name of Substance 2:  marijuana 2 - Age of First Use: Teens 2 - Amount (size/oz): varies 2 - Frequency: Maybe once in a week. 2 - Duration: off and on 2 - Last Use / Amount: 4-5 days ago.                Sleep: Fair  Appetite:  Good  Current Medications: Current Facility-Administered Medications  Medication Dose Route Frequency Provider Last Rate Last Dose  . alum & mag hydroxide-simeth (MAALOX/MYLANTA) 200-200-20 MG/5ML suspension 30 mL  30 mL Oral Q4H PRN Kerry HoughSimon, Spencer E, PA-C      . FLUoxetine (PROZAC) capsule 20 mg  20 mg Oral Daily Kerry HoughSimon, Spencer E, PA-C   20 mg at 05/18/17 [id number]  . gabapentin (NEURONTIN) capsule 300 mg  300 mg Oral BID Kerry HoughSimon, Spencer E, PA-C   300 mg at 05/18/17 0818  . hydrOXYzine (ATARAX/VISTARIL) tablet 25 mg  25 mg Oral Q6H PRN Kerry HoughSimon, Spencer E, PA-C   25 mg at 05/17/17 2109  . lisinopril (PRINIVIL,ZESTRIL) tablet 10 mg  10 mg Oral Daily Kerry HoughSimon, Spencer E, PA-C   10 mg at 05/18/17 0818  . loperamide (IMODIUM) capsule 2-4 mg  2-4 mg Oral PRN Kerry HoughSimon, Spencer E, PA-C      . LORazepam (ATIVAN) tablet 1 mg  1 mg Oral Q6H PRN Kerry HoughSimon, Spencer E, PA-C      . LORazepam (ATIVAN) tablet 1 mg  1 mg Oral BID Kerry HoughSimon, Spencer E, PA-C   1 mg at 05/18/17 [id number]   Followed by  . [START ON 05/19/2017] LORazepam (ATIVAN) tablet 1 mg  1 mg Oral Daily Simon, Spencer E, PA-C      . magnesium hydroxide (MILK OF MAGNESIA) suspension 30 mL  30 mL Oral Daily PRN Donell SievertSimon, Spencer E, PA-C      . mirtazapine (REMERON) tablet 15 mg  15 mg Oral QHS Armandina StammerNwoko, Agnes I, NP   15 mg at 05/17/17 2109  . multivitamin with minerals tablet 1 tablet  1 tablet Oral Daily Kerry HoughSimon, Spencer E, PA-C   1 tablet at 05/18/17 0818  . nicotine polacrilex (NICORETTE) gum 2 mg  2 mg Oral PRN Antonieta Pertlary, Greg Lawson, MD   2 mg at 05/17/17 2109  . ondansetron (ZOFRAN-ODT) disintegrating tablet 4 mg  4 mg Oral Q6H PRN Kerry HoughSimon, Spencer E, PA-C      . phenytoin (DILANTIN) ER capsule 100 mg  100 mg Oral TID Donell SievertSimon, Spencer E, PA-C   100 mg at 05/18/17 0818   . QUEtiapine (SEROQUEL) tablet 100 mg  100 mg Oral QHS Armandina StammerNwoko, Agnes I, NP   100 mg at 05/17/17 2109  . thiamine (B-1) injection 100 mg  100 mg Intramuscular Once Donell SievertSimon, Spencer E, PA-C      . thiamine (VITAMIN B-1) tablet 100 mg  100 mg Oral Daily Kerry HoughSimon, Spencer E, PA-C  100 mg at 05/18/17 0817  . triamcinolone cream (KENALOG) 0.1 %   Topical BID Armandina Stammer I, NP        Lab Results: No results found for this or any previous visit (from the past 48 hour(s)).  Blood Alcohol level:  Lab Results  Component Value Date   ETH 200 (H) 05/15/2017   ETH 307 (HH) 05/11/2017    Metabolic Disorder Labs: No results found for: HGBA1C, MPG No results found for: PROLACTIN No results found for: CHOL, TRIG, HDL, CHOLHDL, VLDL, LDLCALC  Physical Findings: AIMS: Facial and Oral Movements Muscles of Facial Expression: None, normal Lips and Perioral Area: None, normal Jaw: None, normal Tongue: None, normal,Extremity Movements Upper (arms, wrists, hands, fingers): None, normal Lower (legs, knees, ankles, toes): None, normal, Trunk Movements Neck, shoulders, hips: None, normal, Overall Severity Severity of abnormal movements (highest score from questions above): None, normal Incapacitation due to abnormal movements: None, normal Patient's awareness of abnormal movements (rate only patient's report): No Awareness, Dental Status Current problems with teeth and/or dentures?: No Does patient usually wear dentures?: No  CIWA:  CIWA-Ar Total: 3 COWS:     Musculoskeletal: Strength & Muscle Tone: within normal limits Gait & Station: normal Patient leans: N/A  Psychiatric Specialty Exam: Physical Exam  Nursing note and vitals reviewed. Constitutional: He is oriented to person, place, and time. He appears well-developed and well-nourished.  Cardiovascular: Normal rate.  Respiratory: Effort normal.  Musculoskeletal: Normal range of motion.  Neurological: He is alert and oriented to person, place,  and time.  Skin: Skin is warm.    Review of Systems  Constitutional: Negative.   HENT: Negative.   Eyes: Negative.   Respiratory: Negative.   Cardiovascular: Negative.   Gastrointestinal: Negative.   Genitourinary: Negative.   Musculoskeletal: Negative.   Skin: Negative.   Neurological: Negative.   Endo/Heme/Allergies: Negative.   Psychiatric/Behavioral: Positive for depression and substance abuse. Negative for hallucinations and suicidal ideas. The patient is nervous/anxious.     Blood pressure (!) 118/93, pulse 87, temperature 98.4 F (36.9 C), temperature source Oral, resp. rate 20, height 5\' 8"  (1.727 m), weight 60.8 kg (134 lb).Body mass index is 20.37 kg/m.  General Appearance: Casual  Eye Contact:  Good  Speech:  Clear and Coherent and Normal Rate  Volume:  Normal  Mood:  Depressed  Affect:  Flat  Thought Process:  Goal Directed and Descriptions of Associations: Intact  Orientation:  Full (Time, Place, and Person)  Thought Content:  WDL  Suicidal Thoughts:  No  Homicidal Thoughts:  No  Memory:  Immediate;   Good Recent;   Good Remote;   Good  Judgement:  Fair  Insight:  Good  Psychomotor Activity:  Normal  Concentration:  Concentration: Good and Attention Span: Good  Recall:  Good  Fund of Knowledge:  Good  Language:  Good  Akathisia:  No  Handed:  Right  AIMS (if indicated):     Assets:  Communication Skills Desire for Improvement Financial Resources/Insurance Social Support  ADL's:  Intact  Cognition:  WNL  Sleep:  Number of Hours: 6.5   Problems Addressed: MDD severe Alcohol abuse  Treatment Plan Summary: Daily contact with patient to assess and evaluate symptoms and progress in treatment, Medication management and Plan is to:  -Continue Prozac 20 mg PO Daily for mood stability -Continue Neurontin 300 mg PO TID for withdrawal symptoms -Continue Vistaril 25 mg PO Q6H PRN for anxiety -Continue Ativan Detox Protocol -Continue Remeron 15 mg PO  QHS  for mood stability -Continue Seroquel 100 mg PO QHS for mood stability -Encourage group therapy participation   Maryfrances Bunnell, FNP 05/18/2017, 11:36 AM

## 2017-05-18 NOTE — Progress Notes (Signed)
D    Pt is pleasant on approach but does endorse depression   He reports anxiety but also said he feels a little better   He just wasn't happy about being here on his birthday    He interacts well with others and his behavior is appropriate   He has mostly isolated this evening and asked for his medications so he could go back to bed  A    Verbal support given   Medications administered and effectiveness monitored   Q 15 min checks  R    Pt is safe at present time and receptive to verbal support

## 2017-05-19 MED ORDER — HYDROXYZINE HCL 25 MG PO TABS
25.0000 mg | ORAL_TABLET | Freq: Four times a day (QID) | ORAL | Status: DC | PRN
  Administered 2017-05-19: 25 mg via ORAL
  Filled 2017-05-19: qty 1

## 2017-05-19 MED ORDER — HYDROXYZINE HCL 25 MG PO TABS
ORAL_TABLET | ORAL | Status: AC
  Administered 2017-05-19: 25 mg
  Filled 2017-05-19: qty 1

## 2017-05-19 NOTE — [facility] Group Notes (Signed)
[facility] LCSW Group Therapy Note  Date/Time:  05/19/2017 9:00-10:00   Type of Therapy and Topic:  Group Therapy:  Healthy and Unhealthy Supports  Participation Level:  Did Not Attend   Description of Group:  Patients in this group were introduced to the idea of adding a variety of healthy supports to address the various needs in their lives.Patients discussed what additional healthy supports could be helpful in their recovery and wellness after discharge in order to prevent future hospitalizations.   An emphasis was placed on using counselor, doctor, therapy groups, 12-step groups, and problem-specific support groups to expand supports.  They also worked as a group on developing a specific plan for several patients to deal with unhealthy supports through boundary-setting, psychoeducation with loved ones, and even termination of relationships.   Therapeutic Goals:              1)  discuss importance of adding supports to stay well once out of the hospital             2)  compare healthy versus unhealthy supports and identify some examples of each             3)  generate ideas and descriptions of healthy supports that can be added             4)  offer mutual support about how to address unhealthy supports             5)  encourage active participation in and adherence to discharge plan               Summary of Patient Progress:  Pt was invited to attend group but chose not to attend. CSW will continue to encourage pt to attend group throughout their admission.    Therapeutic Modalities:   Motivational Interviewing Brief Solution-Focused Therapy  Heidi DachKelsey Trooper Olander, MSW, LCSW 05/19/2017 9:44 AM

## 2017-05-19 NOTE — Progress Notes (Signed)
D: Patient observed resting in bed this AM. Asked to come up for meds and became agitated, irritable. Patient forwards minimal information. Patient's affect flat, blunted, mood irritable but patient cooperative. Per self inventory and discussions with writer, rates depression at a 1/10, hopelessness at a 1/10 and anxiety at a 3/10. Rates sleep as good, appetite as good, energy as normal and concentration as good.  States goal for today is to "leave here, be good." Denies pain, physical complaints. Slight tremor noted however patient states, "they are basically gone, so much better."   A: Medicated per orders, no prns requested or required. Level III obs in place for safety. Emotional support offered and self inventory reviewed. Encouraged completion of Suicide Safety Plan and programming participation. Discussed POC with MD, SW.  Fall prevention plan in place and reviewed with patient as pt is a high fall risk due to frequent falls PTA. Reviewed BP with Money, NP and will monitor throughout the day.   R: Patient verbalizes understanding of POC. Patient denies SI/HI/AVH and remains safe on level III obs. Will continue to monitor closely and make verbal contact frequently.

## 2017-05-19 NOTE — Progress Notes (Signed)
[facility] MD Progress Note  05/19/2017 11:20 AM Rodney Watson  MRN:  [id number]   Subjective:  Patient states that he is still feeling really good. He denies any SI/HI/AVH and contracts for safety. He states that he wants to talk to the CSW because he wants to rethink going to residential treatment. He agrees to consider his options today. He reports sleeping and eating good. He denies any medication side effects.   Objective: Patient's chart and findings reviewed and discussed with treatment team. Patient is in his room and is pleasant and cooperative. He is in encouraged to go to residential treatment and to follow through with his plan. Will continue to monitor and continue current medications.   Principal Problem: Alcohol abuse with alcohol-induced mood disorder (HCC) Diagnosis:   Patient Active Problem List   Diagnosis Date Noted  . MDD (major depressive disorder), recurrent episode, severe (HCC) [F33.2] 04/13/2017  . Alcohol abuse with alcohol-induced mood disorder (HCC) [F10.14] 04/10/2017  . Alcohol abuse [F10.10] 04/01/2017   Total Time spent with patient: 15 minutes  Past Psychiatric History: See H&P  Past Medical History:  Past Medical History:  Diagnosis Date  . Alcohol abuse   . COPD (chronic obstructive pulmonary disease) (HCC)   . Depression   . ETOH abuse   . Seizures (HCC)    History reviewed. No pertinent surgical history. Family History: History reviewed. No pertinent family history. Family Psychiatric  History: See H&P Social History:  Social History   Substance and Sexual Activity  Alcohol Use Yes   Comment: 5th daily     Social History   Substance and Sexual Activity  Drug Use Yes  . Types: Marijuana   Comment: 1-2 joints/week    Social History   Socioeconomic History  . Marital status: Single    Spouse name: Not on file  . Number of children: Not on file  . Years of education: Not on file  . Highest education level: Not on file  Occupational  History  . Not on file  Social Needs  . Financial resource strain: Not on file  . Food insecurity:    Worry: Not on file    Inability: Not on file  . Transportation needs:    Medical: Not on file    Non-medical: Not on file  Tobacco Use  . Smoking status: Current Every Day Smoker    Packs/day: 1.50    Years: 39.00    Pack years: 58.50    Types: Cigarettes  . Smokeless tobacco: Current User    Types: Chew  Substance and Sexual Activity  . Alcohol use: Yes    Comment: 5th daily  . Drug use: Yes    Types: Marijuana    Comment: 1-2 joints/week  . Sexual activity: Never  Lifestyle  . Physical activity:    Days per week: Not on file    Minutes per session: Not on file  . Stress: Not on file  Relationships  . Social connections:    Talks on phone: Not on file    Gets together: Not on file    Attends religious service: Not on file    Active member of club or organization: Not on file    Attends meetings of clubs or organizations: Not on file    Relationship status: Not on file  Other Topics Concern  . Not on file  Social History Narrative   ** Merged History Encounter **       Additional Social History:  Pain Medications: None Prescriptions: Supposed to be on gabapentin, prozac and dilantin.  Three weeks w/o dilantin. Over the Counter: None History of alcohol / drug use?: Yes Longest period of sobriety (when/how long): patient states that he was clean and sober on one occasion for eight months while intoxicated. Negative Consequences of Use: Personal relationships, Financial Withdrawal Symptoms: Tremors, Nausea / Vomiting, Fever / Chills, Sweats, Diarrhea, Patient aware of relationship between substance abuse and physical/medical complications, Weakness, Seizures Onset of Seizures: Pt says he has epilepsy. Date of most recent seizure: Pt says last seizure was 2-3 days ago.   Name of Substance 2: marijuana 2 - Age of First Use: Teens 2 - Amount (size/oz): varies 2  - Frequency: Maybe once in a week. 2 - Duration: off and on 2 - Last Use / Amount: 4-5 days ago.                Sleep: Fair  Appetite:  Good  Current Medications: Current Facility-Administered Medications  Medication Dose Route Frequency Provider Last Rate Last Dose  . alum & mag hydroxide-simeth (MAALOX/MYLANTA) 200-200-20 MG/5ML suspension 30 mL  30 mL Oral Q4H PRN Kerry Hough, PA-C      . FLUoxetine (PROZAC) capsule 20 mg  20 mg Oral Daily Kerry Hough, PA-C   20 mg at 05/19/17 8119  . gabapentin (NEURONTIN) capsule 300 mg  300 mg Oral BID Kerry Hough, PA-C   300 mg at 05/19/17 1478  . lisinopril (PRINIVIL,ZESTRIL) tablet 10 mg  10 mg Oral Daily Kerry Hough, PA-C   10 mg at 05/19/17 2956  . magnesium hydroxide (MILK OF MAGNESIA) suspension 30 mL  30 mL Oral Daily PRN Kerry Hough, PA-C      . mirtazapine (REMERON) tablet 15 mg  15 mg Oral QHS Armandina Stammer I, NP   15 mg at 05/18/17 2138  . multivitamin with minerals tablet 1 tablet  1 tablet Oral Daily Kerry Hough, PA-C   1 tablet at 05/19/17 2130  . nicotine polacrilex (NICORETTE) gum 2 mg  2 mg Oral PRN Antonieta Pert, MD   2 mg at 05/18/17 1502  . phenytoin (DILANTIN) ER capsule 100 mg  100 mg Oral TID Donell Sievert E, PA-C   100 mg at 05/19/17 8657  . QUEtiapine (SEROQUEL) tablet 100 mg  100 mg Oral QHS Armandina Stammer I, NP   100 mg at 05/18/17 2138  . thiamine (B-1) injection 100 mg  100 mg Intramuscular Once Donell Sievert E, PA-C      . thiamine (VITAMIN B-1) tablet 100 mg  100 mg Oral Daily Donell Sievert E, PA-C   100 mg at 05/19/17 0806  . triamcinolone cream (KENALOG) 0.1 %   Topical BID Armandina Stammer I, NP        Lab Results: No results found for this or any previous visit (from the past 48 hour(s)).  Blood Alcohol level:  Lab Results  Component Value Date   ETH 200 (H) 05/15/2017   ETH 307 (HH) 05/11/2017    Metabolic Disorder Labs: No results found for: HGBA1C, MPG No results  found for: PROLACTIN No results found for: CHOL, TRIG, HDL, CHOLHDL, VLDL, LDLCALC  Physical Findings: AIMS: Facial and Oral Movements Muscles of Facial Expression: None, normal Lips and Perioral Area: None, normal Jaw: None, normal Tongue: None, normal,Extremity Movements Upper (arms, wrists, hands, fingers): None, normal Lower (legs, knees, ankles, toes): None, normal, Trunk Movements Neck, shoulders, hips: None, normal, Overall  Severity Severity of abnormal movements (highest score from questions above): None, normal Incapacitation due to abnormal movements: None, normal Patient's awareness of abnormal movements (rate only patient's report): No Awareness, Dental Status Current problems with teeth and/or dentures?: No Does patient usually wear dentures?: No  CIWA:  CIWA-Ar Total: 2 COWS:     Musculoskeletal: Strength & Muscle Tone: within normal limits Gait & Station: normal Patient leans: N/A  Psychiatric Specialty Exam: Physical Exam  Nursing note and vitals reviewed. Constitutional: He is oriented to person, place, and time. He appears well-developed and well-nourished.  Cardiovascular: Normal rate.  Respiratory: Effort normal.  Musculoskeletal: Normal range of motion.  Neurological: He is alert and oriented to person, place, and time.  Skin: Skin is warm.    Review of Systems  Constitutional: Negative.   HENT: Negative.   Eyes: Negative.   Respiratory: Negative.   Cardiovascular: Negative.   Gastrointestinal: Negative.   Genitourinary: Negative.   Musculoskeletal: Negative.   Skin: Negative.   Neurological: Negative.   Endo/Heme/Allergies: Negative.   Psychiatric/Behavioral: Positive for depression and substance abuse. Negative for hallucinations and suicidal ideas. The patient is nervous/anxious.     Blood pressure (!) 147/101, pulse 81, temperature 98.9 F (37.2 C), temperature source Oral, resp. rate 20, height 5\' 8"  (1.727 m), weight 60.8 kg (134 lb).Body  mass index is 20.37 kg/m.  General Appearance: Casual  Eye Contact:  Good  Speech:  Clear and Coherent and Normal Rate  Volume:  Normal  Mood:  Depressed  Affect:  Flat  Thought Process:  Goal Directed and Descriptions of Associations: Intact  Orientation:  Full (Time, Place, and Person)  Thought Content:  WDL  Suicidal Thoughts:  No  Homicidal Thoughts:  No  Memory:  Immediate;   Good Recent;   Good Remote;   Good  Judgement:  Fair  Insight:  Good  Psychomotor Activity:  Normal  Concentration:  Concentration: Good and Attention Span: Good  Recall:  Good  Fund of Knowledge:  Good  Language:  Good  Akathisia:  No  Handed:  Right  AIMS (if indicated):     Assets:  Communication Skills Desire for Improvement Financial Resources/Insurance Social Support  ADL's:  Intact  Cognition:  WNL  Sleep:  Number of Hours: 6.75   Problems Addressed: MDD severe Alcohol abuse  Treatment Plan Summary: Daily contact with patient to assess and evaluate symptoms and progress in treatment, Medication management and Plan is to:  -Continue Prozac 20 mg PO Daily for mood stability -Continue Neurontin 300 mg PO TID for withdrawal symptoms -Continue Vistaril 25 mg PO Q6H PRN for anxiety -Continue Ativan Detox Protocol -Continue Remeron 15 mg PO QHS for mood stability -Continue Seroquel 100 mg PO QHS for mood stability -Encourage group therapy participation   Maryfrances Bunnellravis B Shiven Junious, FNP 05/19/2017, 11:20 AM

## 2017-05-19 NOTE — [facility] Group Notes (Signed)
[facility] Group Notes:  (Nursing)  Date:  05/19/2017  Time:  1:30 PM Type of Therapy:  Nurse Education  Participation Level:  Did Not Attend  Participation Quality:  did not attend  Affect:  did not attend  Cognitive:  did not attend  Insight:  None  Engagement in Group:  None  Modes of Intervention:  did not attend  Summary of Progress/Problems: Patient did not attend  Shela NevinValerie S [name] 05/19/2017, 2:36 PM

## 2017-05-19 NOTE — Plan of Care (Signed)
Patient verbalizes understanding of information, education provided. 

## 2017-05-19 NOTE — Progress Notes (Signed)
Patient did attend the evening speaker AA meeting.  

## 2017-05-19 NOTE — Progress Notes (Signed)
D    Pt is pleasant on approach but does endorse depression   He reports anxiety but also said he feels a little better      He interacts well with others and his behavior is appropriate   He didn't isolate as much this evening and asked for his medications so he could go back to bed  A    Verbal support given   Medications administered and effectiveness monitored   Q 15 min checks  R    Pt is safe at present time and receptive to verbal support

## 2017-05-20 MED ORDER — QUETIAPINE FUMARATE 100 MG PO TABS: 100.0000 mg | ORAL_TABLET | Freq: Every day | ORAL | 0 refills | Status: DC

## 2017-05-20 MED ORDER — GABAPENTIN 300 MG PO CAPS: 300.0000 mg | ORAL_CAPSULE | Freq: Two times a day (BID) | ORAL | 0 refills | Status: DC

## 2017-05-20 MED ORDER — HYDROXYZINE HCL 25 MG PO TABS: 25.0000 mg | ORAL_TABLET | Freq: Four times a day (QID) | ORAL | 0 refills | Status: DC | PRN

## 2017-05-20 MED ORDER — PHENYTOIN SODIUM EXTENDED 100 MG PO CAPS: 100.0000 mg | ORAL_CAPSULE | Freq: Three times a day (TID) | ORAL | 0 refills | Status: DC

## 2017-05-20 MED ORDER — MIRTAZAPINE 15 MG PO TABS: 15.0000 mg | ORAL_TABLET | Freq: Every day | ORAL | 0 refills | Status: DC

## 2017-05-20 MED ORDER — NICOTINE POLACRILEX 2 MG MT GUM: 2.0000 mg | CHEWING_GUM | OROMUCOSAL | 0 refills | Status: DC | PRN

## 2017-05-20 MED ORDER — FLUOXETINE HCL 20 MG PO CAPS: 20.0000 mg | ORAL_CAPSULE | Freq: Every day | ORAL | 0 refills | Status: DC

## 2017-05-20 NOTE — [facility] Group Notes (Signed)
Pt attended spiritual care group on grief and loss facilitated by chaplain Rodney KingfisherMatthew Ruairi Watson   Group opened with brief discussion and psycho-social ed around grief and loss in relationships and in relation to self - identifying life patterns, circumstances, changes that cause losses. Established group norm of speaking from own life experience. Group goal of establishing open and affirming space for members to share understanding of and experience with grief, normalize grief experience and provide psycho social education and grief support.  Group engaged in facilitated dialog around group topic.  Engaged with four tasks of mourning.   Rodney Watson left group in order to discharge from hospital.    Rodney MonsWL / [facility] Chaplain Rodney KingfisherMatthew Kaylana Watson, MDiv, [facility]

## 2017-05-20 NOTE — [facility] Suicide Risk Assessment (Signed)
[facility] Discharge Suicide Risk Assessment   Principal Problem: Alcohol abuse with alcohol-induced mood disorder [facility]) Discharge Diagnoses:  Patient Active Problem List   Diagnosis Date Noted  . MDD (major depressive disorder), recurrent episode, severe (HCC) [F33.2] 04/13/2017  . Alcohol abuse with alcohol-induced mood disorder (HCC) [F10.14] 04/10/2017  . Alcohol abuse [F10.10] 04/01/2017    Total Time spent with patient: 30 minutes  Musculoskeletal: Strength & Muscle Tone: within normal limits Gait & Station: normal Patient leans: N/A  Psychiatric Specialty Exam: Review of Systems  Constitutional: Negative for chills and fever.  Respiratory: Negative for cough and shortness of breath.   Cardiovascular: Negative for chest pain.  Gastrointestinal: Negative for abdominal pain, heartburn, nausea and vomiting.  Psychiatric/Behavioral: Negative for depression, hallucinations and suicidal ideas. The patient is not nervous/anxious.     Blood pressure (!) 129/102, pulse 92, temperature 98.9 F (37.2 C), temperature source Oral, resp. rate 18, height 5\' 8"  (1.727 m), weight 60.8 kg (134 lb).Body mass index is 20.37 kg/m.  General Appearance: Casual and Fairly Groomed  Patent attorney::  Good  Speech:  Clear and Coherent and Normal Rate  Volume:  Normal  Mood:  Euthymic  Affect:  Appropriate and Congruent  Thought Process:  Goal Directed  Orientation:  Full (Time, Place, and Person)  Thought Content:  Logical  Suicidal Thoughts:  No  Homicidal Thoughts:  No  Memory:  Immediate;   Fair Recent;   Fair Remote;   Fair  Judgement:  Fair  Insight:  Fair  Psychomotor Activity:  Normal  Concentration:  Fair  Recall:  [employer] of Knowledge:Fair  Language: Fair  Akathisia:  No  Handed:    AIMS (if indicated):     Assets:  Communication Skills Resilience Social Support  Sleep:  Number of Hours: 5.75  Cognition: WNL  ADL's:  Intact   Mental Status Per Nursing Assessment::   On  Admission:  Suicidal ideation indicated by patient  Demographic Factors:  Male, Caucasian, Low socioeconomic status and Unemployed  Loss Factors: Financial problems/change in socioeconomic status  Historical Factors: Impulsivity  Risk Reduction Factors:   Positive therapeutic relationship and Positive coping skills or problem solving skills  Continued Clinical Symptoms:  Depression:   Comorbid alcohol abuse/dependence Alcohol/Substance Abuse/Dependencies  Cognitive Features That Contribute To Risk:  None    Suicide Risk:  Minimal: No identifiable suicidal ideation.  Patients presenting with no risk factors but with morbid ruminations; may be classified as minimal risk based on the severity of the depressive symptoms  Follow-up Information    [facility] Follow up on 05/24/2017.   Specialty:  Behavioral Health Why:  Hospital follow-up on Friday, 05/24/17 at 8:45AM. Thank you.  Contact information[address] [place] [address] [phone]         Subjective Data: Rodney Watson is a 52 y/o M with history of MDD and alcohol use disorder who was admitted voluntarily with worsening depression, alcohol use, and poor medication adherence. He has recent relevant history of 2 admissions to [facility] in 03/2017. He was restarted on previous discharge medications and CIWA. He had gradual improvement of his presenting mood symptoms.  Today upon evaluation, pt shares, "I'm doing good." He denies any specific concerns. He denies physical complaints. He denies SI/HI/AH/VH. He is sleeping well. His appetite is good. He is tolerating his medications well and he denies any side effects. He is in agreement to continue his current treatment regimen without changes. He no longer would like to pursue  substance use [facility], and he states that he will maintain sobriety by changing his living environment and social setting. He [place] and then [place] [facility] and [facility]. He was able to engage in safety planning including [facility] or contact emergency services if he feels unable to maintain his own safety or the safety of others. Pt had no further questions, comments, or concerns.   Plan Of Care/Follow-up recommendations:   -Discharge to outpatient level of care  -MDD, recurrent, severe             - Continue prozac 20mg  po qday             - Continue remeron 15mg  po qhs   - Continue seroquel 100mg  po qhs  -Anxiety             - Continue gabapentin 300mg  po BID   - Continue vistaril 25mg  po q6h prn anxiety  - Seizure disorder             - Continue dilantin ER 100mg  TID  - insomnia             - Continue trazodone 100mg  qhs (may repeat x1 prn)  - HTN             - Continue lisinopril 10mg  po qDay  Activity:  as tolerated Diet:  normal Tests:  NA Other:  see above for DC plan  Micheal Likenshristopher T Coen Miyasato, MD 05/20/2017, 8:43 AM

## 2017-05-20 NOTE — Progress Notes (Signed)
  [facility] :  Will you be returning to the same living situation after discharge:  Yes,  homeless/with friends At discharge, do you have transportation home?: Yes,  bus  Do you have the ability to pay for your medications: Yes,  [employer] [employer]  Release of information consent forms completed and submitted to medical records by CSW.  Patient to Follow up at: Follow-up Information    [facility] Follow up on 05/24/2017.   Specialty:  Behavioral Health Why:  Hospital follow-up on Friday, 05/24/17 at 8:45AM. Thank you.  Contact informationElpidio Eric: [address] [place] [address] [phone]        [facility] Follow up.   Specialty:  Addiction Medicine Why:  [facility] No beds currently available. If you are still interested in pursuing treatment at this facility, please call Shayla in admissions daily to check status of referral and bed availability. Thank you.  Contact information: [address] [place] [address] [phone]           Next level of care provider has access to [facility] Link:no  Safety Planning and Suicide Prevention discussed: Yes,  SPE completed with pt; pt declined to consent to collateral contact. SPI pamphlet and Mobile Crisis information provided.  Have you used any form of tobacco in the last 30 days? (Cigarettes, Smokeless Tobacco, Cigars, and/or Pipes): Yes  Has patient been referred to the Quitline?: Patient refused referral  Patient has been referred for addiction treatment: Yes  Pulte HomesHeather N Smart, LCSW 05/20/2017, 8:54 AM

## 2017-05-20 NOTE — Progress Notes (Signed)
Pt discharged home on a bus pass. Pt was ambulatory, stable and appreciative at that time. All papers and prescriptions were given and valuables returned. Verbal understanding expressed. Denies SI/HI and A/VH. Pt given opportunity to express concerns and ask questions.  

## 2017-05-20 NOTE — Discharge Summary (Addendum)
Physician Discharge Summary Note  Patient:  Rodney Watson is an 52 y.o., male MRN:  [id number] DOB:  1965-03-27 Patient phone:  [phone] (home)  Patient address:   [facility] [place] [address],  Total Time spent with patient: 20 minutes  Date of Admission:  05/16/2017 Date of Discharge: 05/20/2017  Reason for Admission: Per assessment note- This is one of several admission assessments for this 52 year old Caucasian male with hx of chronic alcoholism. He is known in this [facility] from previous hospitalizations. He was discharged from this [facility] hospital twice in this past February, 2019 after alcohol detoxification & mood stabilization treatments. He was discharged to follow-up care at the [facility] here in [place], [place]. Patient is currently being re-admitted from the [facility] ED with complaints of alcohol intoxication seeking detoxification treatment.  During this assessment, Rodney Watson presents shaky with flushed facial expression & some lacerations to his nasal area. He reports, "My pack team took me to the [facility] clinic yesterday because I expressed interest in getting detox treatment & quitting alcohol for good. I have been drinking heavily for many years. I started drinking alcohol at age 37 & I never stopped. It just got worse. I had a seizure activities 3-4 days ago, I fell & busted my nose. This happened after I had just finished drinking heavily & had a seizure. I should not be drinking alcohol because I have epilepsy. I have not been taking my seizure medicines except when I come to this hospital. I have just enrolled in the Pack Team. This team will be checking on me from here on & helping me to stay clean & take my medicines. I drink about 6 bottles of the 40 ounce beer daily. I have not taken any medicines since I left this hospital few weeks ago. I am homeless. I was diagnosed with Bipolar disorder in 2009 at the [facility]. Please do not give me Trazodone, it does  not work, but, Seroquel has helped me in the past. It worked well on my mood symptoms as well".    Principal Problem: Alcohol abuse with alcohol-induced mood disorder [facility]) Discharge Diagnoses: Patient Active Problem List   Diagnosis Date Noted  . MDD (major depressive disorder), recurrent episode, severe (HCC) [F33.2] 04/13/2017  . Alcohol abuse with alcohol-induced mood disorder (HCC) [F10.14] 04/10/2017  . Alcohol abuse [F10.10] 04/01/2017    Past Psychiatric History:   Past Medical History:  Past Medical History:  Diagnosis Date  . Alcohol abuse   . COPD (chronic obstructive pulmonary disease) (HCC)   . Depression   . ETOH abuse   . Seizures (HCC)    History reviewed. No pertinent surgical history. Family History: History reviewed. No pertinent family history. Family Psychiatric  History:  Social History:  Social History   Substance and Sexual Activity  Alcohol Use Yes   Comment: 5th daily     Social History   Substance and Sexual Activity  Drug Use Yes  . Types: Marijuana   Comment: 1-2 joints/week    Social History   Socioeconomic History  . Marital status: Single    Spouse name: Not on file  . Number of children: Not on file  . Years of education: Not on file  . Highest education level: Not on file  Occupational History  . Not on file  Social Needs  . Financial resource strain: Not on file  . Food insecurity:    Worry: Not on file    Inability: Not on  file  . Transportation needs:    Medical: Not on file    Non-medical: Not on file  Tobacco Use  . Smoking status: Current Every Day Smoker    Packs/day: 1.50    Years: 39.00    Pack years: 58.50    Types: Cigarettes  . Smokeless tobacco: Current User    Types: Chew  Substance and Sexual Activity  . Alcohol use: Yes    Comment: 5th daily  . Drug use: Yes    Types: Marijuana    Comment: 1-2 joints/week  . Sexual activity: Never  Lifestyle  . Physical activity:    Days per week: Not on file     Minutes per session: Not on file  . Stress: Not on file  Relationships  . Social connections:    Talks on phone: Not on file    Gets together: Not on file    Attends religious service: Not on file    Active member of club or organization: Not on file    Attends meetings of clubs or organizations: Not on file    Relationship status: Not on file  Other Topics Concern  . Not on file  Social History Narrative   ** Merged History [facility] Course:  Rodney Watson was admitted for Alcohol abuse with alcohol-induced mood disorder (HCC)  and crisis management.  Pt was treated discharged with the medications listed below under Medication List.  Medical problems were identified and treated as needed.  Home medications were restarted as appropriate.  Improvement was monitored by observation and Rodney Watson 's daily report of symptom reduction.  Emotional and mental status was monitored by daily self-inventory reports completed by Rodney Watson and clinical staff.         Rodney Watson was evaluated by the treatment team for stability and plans for continued recovery upon discharge. Rodney Watson 's motivation was an integral factor for scheduling further treatment. Employment, transportation, bed availability, health status, family support, and any pending legal issues were also considered during hospital stay. Pt was offered further treatment options upon discharge including but not limited to Residential, Intensive Outpatient, and Outpatient treatment.  Rodney Watson will follow up with the services as listed below under Follow Up Information.     Upon completion of this admission the patient was both mentally and medically stable for discharge denying suicidal/homicidal ideation, auditory/visual/tactile hallucinations, delusional thoughts and paranoia.    Rodney Watson responded well to treatment with Remeron 15 mg, Neurontin 300mg , Prozac 20 mg  and Dilantin 100 mg  TIDwithout adverse effects. Pt demonstrated improvement without reported or observed adverse effects to the point of stability appropriate for outpatient management. Pertinent labs include:CMP, CBC and + for [facility], for which outpatient follow-up is necessary for lab recheck as mentioned below. Reviewed CBC, CMP, BAL, and UDS; all unremarkable aside from noted exceptions.   Physical Findings: AIMS: Facial and Oral Movements Muscles of Facial Expression: None, normal Lips and Perioral Area: None, normal Jaw: None, normal Tongue: None, normal,Extremity Movements Upper (arms, wrists, hands, fingers): None, normal Lower (legs, knees, ankles, toes): None, normal, Trunk Movements Neck, shoulders, hips: None, normal, Overall Severity Severity of abnormal movements (highest score from questions above): None, normal Incapacitation due to abnormal movements: None, normal Patient's awareness of abnormal movements (rate only patient's report): No Awareness, Dental Status Current problems with teeth and/or dentures?: No Does patient usually wear dentures?: No  CIWA:  CIWA-Ar Total: 2 COWS:  Musculoskeletal: Strength & Muscle Tone: within normal limits Gait & Station: normal Patient leans: N/A  Psychiatric Specialty Exam: See SRA by MD Physical Exam  Constitutional: He appears well-developed.  Psychiatric: He has a normal mood and affect. His behavior is normal.    Review of Systems  Psychiatric/Behavioral: Positive for depression. Negative for suicidal ideas. The patient is not nervous/anxious.   All other systems reviewed and are negative.   Blood pressure (!) 129/102, pulse 92, temperature 98.9 F (37.2 C), temperature source Oral, resp. rate 18, height 5\' 8"  (1.727 m), weight 60.8 kg (134 lb).Body mass index is 20.37 kg/m.   Have you used any form of tobacco in the last 30 days? (Cigarettes, Smokeless Tobacco, Cigars, and/or Pipes): Yes  Has this patient used any form of tobacco in the  last 30 days? (Cigarettes, Smokeless Tobacco, Cigars, and/or Pipes) Yes, Yes, A prescription for an FDA-approved tobacco cessation medication was offered at discharge and the patient refused  Blood Alcohol level:  Lab Results  Component Value Date   ETH 200 (H) 05/15/2017   ETH 307 (HH) 05/11/2017    Metabolic Disorder Labs:  No results found for: HGBA1C, MPG No results found for: PROLACTIN No results found for: CHOL, TRIG, HDL, CHOLHDL, VLDL, LDLCALC  See Psychiatric Specialty Exam and Suicide Risk Assessment completed by Attending Physician prior to discharge.  Discharge destination:  Home  Is patient on multiple antipsychotic therapies at discharge:  No   Has Patient had three or more failed trials of antipsychotic monotherapy by history:  No  Recommended Plan for Multiple Antipsychotic Therapies: NA  Discharge Instructions    Diet - low sodium heart healthy   Complete by:  As directed    Discharge instructions   Complete by:  As directed    Take all medications as prescribed. Keep all follow-up appointments as scheduled.  Do not consume alcohol or use illegal drugs while on prescription medications. Report any adverse effects from your medications to your primary care provider promptly.  In the event of recurrent symptoms or worsening symptoms, call 911, a crisis hotline, or go to the nearest emergency department for evaluation.   Increase activity slowly   Complete by:  As directed      Allergies as of 05/20/2017      Reactions   Chocolate    Chocolate Hives   Vicodin [hydrocodone-acetaminophen] Nausea And Vomiting      Medication List    STOP taking these medications   acamprosate 333 MG tablet Commonly known as:  CAMPRAL   cloNIDine 0.1 MG tablet Commonly known as:  CATAPRES   traZODone 100 MG tablet Commonly known as:  DESYREL     TAKE these medications     Indication  FLUoxetine 20 MG capsule Commonly known as:  PROZAC Take 1 capsule (20 mg total)  by mouth daily. Start taking on:  05/21/2017 What changed:    medication strength  how much to take  additional instructions  Indication:  Excessive Use of Alcohol   gabapentin 300 MG capsule Commonly known as:  NEURONTIN Take 1 capsule (300 mg total) by mouth 2 (two) times daily. What changed:    when to take this  additional instructions  Another medication with the same name was removed. Continue taking this medication, and follow the directions you see here.  Indication:  Alcohol Withdrawal Syndrome, Agitation   hydrOXYzine 25 MG tablet Commonly known as:  ATARAX/VISTARIL Take 1 tablet (25 mg total) by mouth every 6 (  six) hours as needed for anxiety. What changed:    how much to take  how to take this  when to take this  reasons to take this  additional instructions  Indication:  Feeling Anxious   lisinopril 10 MG tablet Commonly known as:  PRINIVIL,ZESTRIL Take 1 tablet (10 mg total) by mouth daily. For high blood pressure  Indication:  High Blood Pressure Disorder   mirtazapine 15 MG tablet Commonly known as:  REMERON Take 1 tablet (15 mg total) by mouth at bedtime. What changed:  additional instructions  Indication:  Major Depressive Disorder, Insomnia   nicotine polacrilex 2 MG gum Commonly known as:  NICORETTE Take 1 each (2 mg total) by mouth as needed for smoking cessation.  Indication:  Nicotine Addiction   phenytoin 100 MG ER capsule Commonly known as:  DILANTIN Take 1 capsule (100 mg total) by mouth 3 (three) times daily. What changed:    additional instructions  Another medication with the same name was removed. Continue taking this medication, and follow the directions you see here.  Indication:  Seizure   QUEtiapine 100 MG tablet Commonly known as:  SEROQUEL Take 1 tablet (100 mg total) by mouth at bedtime.  Indication:  Agitation, Generalized Anxiety Disorder, Mood control   triamcinolone cream 0.1 % Commonly known as:   KENALOG Apply 1 application topically 3 (three) times daily. For rashes  Indication:  Skin Inflammation      Follow-up Information    [facility] Follow up on 05/24/2017.   Specialty:  Behavioral Health Why:  Hospital follow-up on Friday, 05/24/17 at 8:45AM. Thank you.  Contact informationElpidio Eric: [address] [place] [address] [phone]           Follow-up recommendations:  Activity:  as tolerated  Diet:  heart healthy  Comments:  Take all medications as prescribed. Keep all follow-up appointments as scheduled.  Do not consume alcohol or use illegal drugs while on prescription medications. Report any adverse effects from your medications to your primary care provider promptly.  In the event of recurrent symptoms or worsening symptoms, call 911, a crisis hotline, or go to the nearest emergency department for evaluation.   Signed: Oneta Rackanika N Lewis, NP 05/20/2017, 8:39 AM   Patient seen, Suicide Assessment Completed.  Disposition Plan Reviewed

## 2017-05-20 NOTE — Progress Notes (Signed)
Recreation Therapy Notes  Date: 3.29.19 Time: 9:30 a.m. Location: 300 [address] Dayroom   Group Topic: Stress Management   Goal Area(s) Addresses:  Goal 1.1: To reduce stress  -Patient will report feeling a reduction in stress level  -Patient will identify the importance of stress management  -Patient will participate during stress management group treatment     Intervention: Stress Management   Activity: Guided Imagery- Patients were in a peaceful environment with soft lighting enhancing patients mood. Patients were read a guided imagery script to help decrease stress levels   Education: Stress Management, Discharge Planning.    Education Outcome: Acknowledges edcuation/In group clarification offered/Needs additional education   Clinical Observations/Feedback:: Patient did not attend     Sheryle HailDarian Machell Wirthlin, Recreation Therapy Intern   Sheryle HailDarian Hazelle Woollard 05/20/2017 9:04 AM

## 2017-06-01 ENCOUNTER — Encounter (HOSPITAL_COMMUNITY): Payer: Self-pay | Admitting: Emergency Medicine

## 2017-06-01 ENCOUNTER — Emergency Department (HOSPITAL_COMMUNITY)
Admission: EM | Admit: 2017-06-01 | Discharge: 2017-06-02 | Disposition: A | Discharge status: Home / Self Care | Payer: Medicaid Other | Attending: Emergency Medicine | Admitting: Emergency Medicine

## 2017-06-01 DIAGNOSIS — Z79899 Other long term (current) drug therapy: Secondary | ICD-10-CM | POA: Diagnosis not present

## 2017-06-01 DIAGNOSIS — F17210 Nicotine dependence, cigarettes, uncomplicated: Secondary | ICD-10-CM | POA: Diagnosis not present

## 2017-06-01 DIAGNOSIS — F332 Major depressive disorder, recurrent severe without psychotic features: Secondary | ICD-10-CM | POA: Diagnosis not present

## 2017-06-01 DIAGNOSIS — R45851 Suicidal ideations: Secondary | ICD-10-CM | POA: Diagnosis not present

## 2017-06-01 DIAGNOSIS — J449 Chronic obstructive pulmonary disease, unspecified: Secondary | ICD-10-CM | POA: Insufficient documentation

## 2017-06-01 DIAGNOSIS — F1014 Alcohol abuse with alcohol-induced mood disorder: Secondary | ICD-10-CM | POA: Diagnosis present

## 2017-06-01 DIAGNOSIS — F17220 Nicotine dependence, chewing tobacco, uncomplicated: Secondary | ICD-10-CM | POA: Insufficient documentation

## 2017-06-01 DIAGNOSIS — F1010 Alcohol abuse, uncomplicated: Secondary | ICD-10-CM

## 2017-06-01 LAB — COMPREHENSIVE METABOLIC PANEL
ALBUMIN: 4.7 g/dL (ref 3.5–5.0)
ALK PHOS: 93 U/L (ref 38–126)
ALT: 55 U/L (ref 17–63)
ANION GAP: 13 (ref 5–15)
AST: 71 U/L — ABNORMAL HIGH (ref 15–41)
BUN: 7 mg/dL (ref 6–20)
CALCIUM: 8.9 mg/dL (ref 8.9–10.3)
CHLORIDE: 105 mmol/L (ref 101–111)
CO2: 26 mmol/L (ref 22–32)
Creatinine, Ser: 0.73 mg/dL (ref 0.61–1.24)
GFR calc Af Amer: >60 mL/min (ref >60)
GFR calc non Af Amer: >60 mL/min (ref >60)
GLUCOSE: 88 mg/dL (ref 65–99)
Potassium: 3.8 mmol/L (ref 3.5–5.1)
SODIUM: 144 mmol/L (ref 135–145)
Total Bilirubin: 0.4 mg/dL (ref 0.3–1.2)
Total Protein: 8.6 g/dL — ABNORMAL HIGH (ref 6.5–8.1)

## 2017-06-01 LAB — CBC WITH DIFFERENTIAL/PLATELET
BASOS PCT: 2 %
Basophils Absolute: 0.1 10*3/uL (ref 0.0–0.1)
EOS ABS: 0.1 10*3/uL (ref 0.0–0.7)
Eosinophils Relative: 2 %
HCT: 49.2 % (ref 39.0–52.0)
Hemoglobin: 17.3 g/dL — ABNORMAL HIGH (ref 13.0–17.0)
Lymphocytes Relative: 40 %
Lymphs Abs: 2.4 10*3/uL (ref 0.7–4.0)
MCH: 32 pg (ref 26.0–34.0)
MCHC: 35.2 g/dL (ref 30.0–36.0)
MCV: 91.1 fL (ref 78.0–100.0)
Monocytes Absolute: 0.4 10*3/uL (ref 0.1–1.0)
Monocytes Relative: 6 %
Neutro Abs: 2.9 10*3/uL (ref 1.7–7.7)
Neutrophils Relative %: 50 %
PLATELETS: 180 10*3/uL (ref 150–400)
RBC: 5.4 MIL/uL (ref 4.22–5.81)
RDW: 16.7 % — ABNORMAL HIGH (ref 11.5–15.5)
WBC: 5.9 10*3/uL (ref 4.0–10.5)

## 2017-06-01 LAB — RAPID URINE DRUG SCREEN, HOSP PERFORMED
Amphetamines: NOT DETECTED
BARBITURATES: NOT DETECTED
Benzodiazepines: NOT DETECTED
Cocaine: NOT DETECTED
Opiates: NOT DETECTED
TETRAHYDROCANNABINOL: POSITIVE — AB

## 2017-06-01 LAB — SALICYLATE LEVEL: Salicylate Lvl: <7 mg/dL (ref 2.8–30.0)

## 2017-06-01 LAB — ACETAMINOPHEN LEVEL: Acetaminophen (Tylenol), Serum: <10 ug/mL — ABNORMAL LOW (ref 10–30)

## 2017-06-01 LAB — ETHANOL: ALCOHOL ETHYL (B): 380 mg/dL — AB (ref <10)

## 2017-06-01 MED ORDER — HALOPERIDOL 5 MG PO TABS: 5.0000 mg | ORAL_TABLET | Freq: Four times a day (QID) | ORAL | Status: DC | PRN

## 2017-06-01 MED ORDER — ONDANSETRON HCL 4 MG PO TABS: 4.0000 mg | ORAL_TABLET | Freq: Three times a day (TID) | ORAL | Status: DC | PRN

## 2017-06-01 MED ORDER — NICOTINE 21 MG/24HR TD PT24
21.0000 mg | MEDICATED_PATCH | Freq: Every day | TRANSDERMAL | Status: DC
  Administered 2017-06-01: 21 mg via TRANSDERMAL
  Filled 2017-06-01: qty 1

## 2017-06-01 MED ORDER — BENZTROPINE MESYLATE 1 MG PO TABS: 1.0000 mg | ORAL_TABLET | Freq: Four times a day (QID) | ORAL | Status: DC | PRN

## 2017-06-01 MED ORDER — HALOPERIDOL LACTATE 5 MG/ML IJ SOLN
INTRAMUSCULAR | Status: AC
  Administered 2017-06-01: 5 mg via INTRAMUSCULAR
  Filled 2017-06-01: qty 1

## 2017-06-01 MED ORDER — ALUM & MAG HYDROXIDE-SIMETH 200-200-20 MG/5ML PO SUSP: 30.0000 mL | Freq: Four times a day (QID) | ORAL | Status: DC | PRN

## 2017-06-01 MED ORDER — LORAZEPAM 2 MG/ML IJ SOLN
2.0000 mg | Freq: Once | INTRAMUSCULAR | Status: AC
  Administered 2017-06-01: 2 mg via INTRAVENOUS
  Filled 2017-06-01: qty 1

## 2017-06-01 MED ORDER — SODIUM CHLORIDE 0.9 % IV SOLN
8.0000 mg | Freq: Once | INTRAVENOUS | Status: AC
  Administered 2017-06-01: 8 mg via INTRAVENOUS
  Filled 2017-06-01: qty 4

## 2017-06-01 MED ORDER — SODIUM CHLORIDE 0.9 % IV SOLN
1500.0000 mg | Freq: Once | INTRAVENOUS | Status: AC
  Administered 2017-06-01: 1500 mg via INTRAVENOUS
  Filled 2017-06-01: qty 30

## 2017-06-01 MED ORDER — ACETAMINOPHEN 325 MG PO TABS: 650.0000 mg | ORAL_TABLET | ORAL | Status: DC | PRN

## 2017-06-01 MED ORDER — HALOPERIDOL LACTATE 5 MG/ML IJ SOLN
5.0000 mg | Freq: Once | INTRAMUSCULAR | Status: AC
  Administered 2017-06-01: 5 mg via INTRAMUSCULAR

## 2017-06-01 NOTE — ED Notes (Signed)
Bed: WA16 Expected date:  Expected time:  Means of arrival:  Comments: Res A 

## 2017-06-01 NOTE — ED Triage Notes (Signed)
Pt reports that he takes Dilantin, not depakote for seizures. Dr Fayrene FearingJames at bedside

## 2017-06-01 NOTE — ED Notes (Signed)
Patient's clothes placed behind charge desk. There are 3 belonging bags with patient's personal bookbag

## 2017-06-01 NOTE — ED Provider Notes (Signed)
[facility]-EMERGENCY DEPT Provider Note   CSN: [id number] Arrival date & time: 06/01/17  1553     History   Chief Complaint Chief Complaint  Patient presents with  . Alcohol Intoxication  . Suicidal    HPI Rodney Watson is a 52 y.o. male.  Complaint is intoxication, suicidal ideation.  HPI: Patient presents for evaluation.  He states he had "4, 40s today".  He states "if you discharge me from here and going to step in front of a car".  He states he is not had his Dilantin for a week.  He also inquires if I could refill his medications (?).  Denies any ingestions.  No illicit drug use other than marijuana.  Denies salicylates or Tylenol.  Past Medical History:  Diagnosis Date  . Alcohol abuse   . COPD (chronic obstructive pulmonary disease) (HCC)   . Depression   . ETOH abuse   . Seizures [facility])     Patient Active Problem List   Diagnosis Date Noted  . MDD (major depressive disorder), recurrent episode, severe (HCC) 04/13/2017  . Alcohol abuse with alcohol-induced mood disorder (HCC) 04/10/2017  . Alcohol abuse 04/01/2017    History reviewed. No pertinent surgical history.      Home Medications    Prior to Admission medications   Medication Sig Start Date End Date Taking? Authorizing Provider  FLUoxetine (PROZAC) 20 MG capsule Take 1 capsule (20 mg total) by mouth daily. 05/21/17  Yes Oneta Rack, NP  gabapentin (NEURONTIN) 300 MG capsule Take 1 capsule (300 mg total) by mouth 2 (two) times daily. 05/20/17  Yes Oneta Rack, NP  lisinopril (PRINIVIL,ZESTRIL) 10 MG tablet Take 1 tablet (10 mg total) by mouth daily. For high blood pressure 04/17/17  Yes Nwoko, Agnes I, NP  mirtazapine (REMERON) 15 MG tablet Take 1 tablet (15 mg total) by mouth at bedtime. 05/20/17  Yes Oneta Rack, NP  nicotine polacrilex (NICORETTE) 2 MG gum Take 1 each (2 mg total) by mouth as needed for smoking cessation. 05/20/17  Yes Oneta Rack, NP  phenytoin  (DILANTIN) 100 MG ER capsule Take 1 capsule (100 mg total) by mouth 3 (three) times daily. 05/20/17  Yes Oneta Rack, NP  QUEtiapine (SEROQUEL) 100 MG tablet Take 1 tablet (100 mg total) by mouth at bedtime. 05/20/17  Yes Oneta Rack, NP  triamcinolone cream (KENALOG) 0.1 % Apply 1 application topically 3 (three) times daily. For rashes 04/16/17  Yes Armandina Stammer I, NP  hydrOXYzine (ATARAX/VISTARIL) 25 MG tablet Take 1 tablet (25 mg total) by mouth every 6 (six) hours as needed for anxiety. 05/20/17   Oneta Rack, NP    Family History No family history on file.  Social History Social History   Tobacco Use  . Smoking status: Current Every Day Smoker    Packs/day: 1.50    Years: 39.00    Pack years: 58.50    Types: Cigarettes  . Smokeless tobacco: Current User    Types: Chew  Substance Use Topics  . Alcohol use: Yes    Comment: 5th daily  . Drug use: Yes    Types: Marijuana    Comment: 1-2 joints/week     Allergies   Chocolate; Chocolate; and Vicodin [hydrocodone-acetaminophen]   Review of Systems Review of Systems  Constitutional: Negative for appetite change, chills, diaphoresis, fatigue and fever.  HENT: Negative for mouth sores, sore throat and trouble swallowing.   Eyes: Negative for visual disturbance.  Respiratory: Negative for cough, chest tightness, shortness of breath and wheezing.   Cardiovascular: Negative for chest pain.  Gastrointestinal: Negative for abdominal distention, abdominal pain, diarrhea, nausea and vomiting.  Endocrine: Negative for polydipsia, polyphagia and polyuria.  Genitourinary: Negative for dysuria, frequency and hematuria.  Musculoskeletal: Negative for gait problem.  Skin: Negative for color change, pallor and rash.  Neurological: Negative for dizziness, syncope, light-headedness and headaches.  Hematological: Does not bruise/bleed easily.  Psychiatric/Behavioral: Positive for suicidal ideas. Negative for behavioral problems and  confusion.     Physical Exam Updated Vital Signs BP 127/74   Pulse (!) 106   Temp 98.6 F (37 C) (Oral)   Resp 17   Ht 5\' 8"  (1.727 m)   Wt 60.8 kg (134 lb)   SpO2 95%   BMI 20.37 kg/m   Physical Exam  Constitutional: He is oriented to person, place, and time. He appears well-developed and well-nourished. No distress.  HENT:  Head: Normocephalic.  Eyes: Pupils are equal, round, and reactive to light. Conjunctivae are normal. No scleral icterus.  Neck: Normal range of motion. Neck supple. No thyromegaly present.  Cardiovascular: Normal rate and regular rhythm. Exam reveals no gallop and no friction rub.  No murmur heard. Pulmonary/Chest: Effort normal and breath sounds normal. No respiratory distress. He has no wheezes. He has no rales.  Abdominal: Soft. Bowel sounds are normal. He exhibits no distension. There is no tenderness. There is no rebound.  Musculoskeletal: Normal range of motion.  Neurological: He is alert and oriented to person, place, and time.  Skin: Skin is warm and dry. No rash noted.  Psychiatric:  Slurred speech.  Calm affect.  Conversant with me.     ED Treatments / Results  Labs (all labs ordered are listed, but only abnormal results are displayed) Labs Reviewed  CBC WITH DIFFERENTIAL/PLATELET - Abnormal; Notable for the following components:      Result Value   Hemoglobin 17.3 (*)    RDW 16.7 (*)    All other components within normal limits  COMPREHENSIVE METABOLIC PANEL - Abnormal; Notable for the following components:   Total Protein 8.6 (*)    AST 71 (*)    All other components within normal limits  RAPID URINE DRUG SCREEN, HOSP PERFORMED - Abnormal; Notable for the following components:   Tetrahydrocannabinol POSITIVE (*)    All other components within normal limits  ETHANOL - Abnormal; Notable for the following components:   Alcohol, Ethyl (B) 380 (*)    All other components within normal limits  ACETAMINOPHEN LEVEL  SALICYLATE LEVEL     EKG None  Radiology No results found.  Procedures Procedures (including critical care time)  Medications Ordered in ED Medications  ondansetron (ZOFRAN) 8 mg in sodium chloride 0.9 % 50 mL IVPB (has no administration in time range)  LORazepam (ATIVAN) injection 2 mg (has no administration in time range)  fosPHENYtoin (CEREBYX) 1,500 mg PE in sodium chloride 0.9 % 50 mL IVPB (0 mg PE Intravenous Stopped 06/01/17 2223)     Initial Impression / Assessment and Plan / ED Course  I have reviewed the triage vital signs and the nursing notes.  Pertinent labs & imaging results that were available during my care of the patient were reviewed by me and considered in my medical decision making (see chart for details).     Patient medically cleared pending Tylenol and salicylate.  Is nauseated.  Placed on CIWA orders.  Getting Ativan.  Awaiting TTS.   Final  Clinical Impressions(s) / ED Diagnoses   Final diagnoses:  Alcohol abuse  Suicidal ideations    ED Discharge Orders    None       Rolland Porter, MD 06/07/17 2019

## 2017-06-01 NOTE — ED Notes (Signed)
Pt provided with meal tray.

## 2017-06-01 NOTE — ED Notes (Signed)
Date and time results received: 06/01/17 1735 (use smartphrase ".now" to insert current time)  Test: ETOH Critical Value: 380  Name of Provider Notified: Dr Fayrene FearingJames  Orders Received? Or Actions Taken?: Actions Taken: Notified Dr Fayrene FearingJames and Primary RN

## 2017-06-01 NOTE — ED Notes (Signed)
Bed: RESA Expected date:  Expected time:  Means of arrival:  Comments: SI/ETOH/Seizure

## 2017-06-01 NOTE — ED Triage Notes (Signed)
Pt picked up in [employer] parking lot by EMS. Pt called [employer] and told them that he was going to kill himself. Bystanders stated on scene pt was screaming in parking lot. Pt reports to EMS that he drank "4- 40's" today. Pt adds that he smokes marijuana. Pt has hx of seizures. Pt reports that he has not taken his depakote or prozac for approx 3 weeks. Pt appears to be intoxicated and smells of ETOH. Pt is A&O and in NAD

## 2017-06-01 NOTE — ED Notes (Signed)
Patient speaking to his "Cherlynn PerchesFriend Blake. Harrison MonsBlake was my friend who died a few years ago of cancer and he comes around every now and then to give me shit." Patient states that "Harrison MonsBlake" comes in correlation to his withdraw. EDP made aware of [facility].

## 2017-06-01 NOTE — ED Notes (Signed)
Heard pt asking "who are you talking to?  Are you talking to me?"  Pt asked this nurse "is there someone there? (pointing at the paper towel dispenser)."  Made him aware there is no one there but a paper towel dispenser.  Admits to having audio and visual hallucinations.

## 2017-06-01 NOTE — ED Notes (Signed)
Patients clothes saturated in urine. Placed in 3 belonging bags with patient's personal bookbag

## 2017-06-02 MED ORDER — PHENYTOIN SODIUM EXTENDED 100 MG PO CAPS
100.0000 mg | ORAL_CAPSULE | Freq: Three times a day (TID) | ORAL | Status: DC
  Administered 2017-06-02: 100 mg via ORAL
  Filled 2017-06-02: qty 1

## 2017-06-02 MED ORDER — GABAPENTIN 300 MG PO CAPS
300.0000 mg | ORAL_CAPSULE | Freq: Two times a day (BID) | ORAL | Status: DC
  Administered 2017-06-02: 300 mg via ORAL
  Filled 2017-06-02: qty 1

## 2017-06-02 MED ORDER — FLUOXETINE HCL 20 MG PO CAPS: 20.0000 mg | ORAL_CAPSULE | Freq: Every day | ORAL | 0 refills | Status: DC

## 2017-06-02 MED ORDER — QUETIAPINE FUMARATE 100 MG PO TABS: 100.0000 mg | ORAL_TABLET | Freq: Every day | ORAL | 0 refills | Status: DC

## 2017-06-02 MED ORDER — GABAPENTIN 300 MG PO CAPS: 300.0000 mg | ORAL_CAPSULE | Freq: Two times a day (BID) | ORAL | 0 refills | Status: DC

## 2017-06-02 MED ORDER — LORAZEPAM 1 MG PO TABS
2.0000 mg | ORAL_TABLET | Freq: Once | ORAL | Status: AC
  Administered 2017-06-02: 2 mg via ORAL
  Filled 2017-06-02: qty 2

## 2017-06-02 MED ORDER — QUETIAPINE FUMARATE 100 MG PO TABS: 100.0000 mg | ORAL_TABLET | Freq: Every day | ORAL | Status: DC

## 2017-06-02 MED ORDER — PHENYTOIN SODIUM EXTENDED 100 MG PO CAPS: 100.0000 mg | ORAL_CAPSULE | Freq: Three times a day (TID) | ORAL | 0 refills | Status: DC

## 2017-06-02 NOTE — ED Notes (Signed)
Pt verbalizes understanding discharge instructions but did not sign.

## 2017-06-02 NOTE — ED Notes (Signed)
Pt sleeping soundly at this time. Pt has sitter at bedside

## 2017-06-02 NOTE — Patient Outreach (Signed)
CPSS met with the patient and provided substance use recovery support. Patient is interested in substance use recovery inpatient treatment. CPSS talked to the patient about [facility] and provided information for their residential program. CPSS also provided other substance use recovery resources including residential/outpatient substance use treatment center list, AA/NA meeting list, and CPSS contact information. CPSS encouraged the patient to continue to stay in contact with his [place] and go to a recovery support group meeting for further substance use recovery support after he leaves the hospital. CPSS also encouraged the patient to contact CPSS at anytime for substance use recovery support or help with substance use recovery resources.

## 2017-06-02 NOTE — ED Notes (Signed)
Pt states he does not have money to get prescriptions. [facility]
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
## 2017-06-02 NOTE — BH Assessment (Signed)
[facility] Assessment Progress Note      Per Dr. Jannifer FranklinAkintayo, patient can be discharged after meeting with Peer Support and [facility]

## 2017-06-02 NOTE — [facility] Suicide Risk Assessment (Cosign Needed)
Suicide Risk Assessment  Discharge Assessment   [facility]   Principal Problem: Alcohol abuse with alcohol-induced mood disorder [facility](HCC) Discharge Diagnoses:  Patient Active Problem List   Diagnosis Date Noted  . MDD (major depressive disorder), recurrent episode, severe (HCC) [F33.2] 04/13/2017  . Alcohol abuse with alcohol-induced mood disorder (HCC) [F10.14] 04/10/2017  . Alcohol abuse [F10.10] 04/01/2017   Pt was seen and chart reviewed with treatment team and Dr Jannifer FranklinAkintayo.  Pt denies suicidal/homicidal ideation, denies auditory/visual hallucinations and does not appear to be responding to internal stimuli. Pt stated he is homeless [facility] to a [facility] team to help him with his medication and housing needs. Pt stated he knows he drinks too much and he wants to get help for his alcoholism. Pt wants to talk with Peer Support prior to discharge. [facility]. Pt's BAL 380 and UDS positive for THC. Pt has a seizure disorder and will be given a prescription for hid Dilantin. Pt is psychiatrically clear for discharge.   Total Time spent with patient: 30 minutes  Musculoskeletal: Strength & Muscle Tone: within normal limits Gait & Station: normal Patient leans: N/A  Psychiatric Specialty Exam:   Blood pressure (!) 134/95, pulse 88, temperature 98.6 F (37 C), temperature source Oral, resp. rate 18, height 5\' 8"  (1.727 m), weight 60.8 kg (134 lb), SpO2 95 %.Body mass index is 20.37 kg/m.  General Appearance: Casual  Eye Contact::  Fair  Speech:  Clear and Coherent and Slow409  Volume:  Normal  Mood:  Depressed  Affect:  Congruent and Depressed  Thought Process:  Coherent  Orientation:  Full (Time, Place, and Person)  Thought Content:  Logical  Suicidal Thoughts:  No  Homicidal Thoughts:  No  Memory:  Immediate;   Good Recent;   Good Remote;   Fair  Judgement:  Poor  Insight:  Shallow   Psychomotor Activity:  Normal  Concentration:  Fair  Recall:  Fis[employer]Fair  Fund of Knowledge:Good  Language: Good  Akathisia:  No  Handed:  Right  AIMS (if indicated):     Assets:  Communication Skills  Sleep:     Cognition: WNL  ADL's:  Intact   Mental Status Per Nursing Assessment::   On Admission:   Intoxicated  Demographic Factors:  Male, Caucasian, Low socioeconomic status and Unemployed  Loss Factors: Financial problems/change in socioeconomic status  Historical Factors: Impulsivity  Risk Reduction Factors:   Sense of responsibility to family  Continued Clinical Symptoms:  Depression:   Impulsivity Alcohol/Substance Abuse/Dependencies  Cognitive Features That Contribute To Risk:  Closed-mindedness    Suicide Risk:  Minimal: No identifiable suicidal ideation.  Patients presenting with no risk factors but with morbid ruminations; may be classified as minimal risk based on the severity of the depressive symptoms    Plan Of Care/Follow-up recommendations:  Activity:  as tolerated Diet:  Heart Healthy  Laveda AbbeLaurie Britton Lindsey Demonte, NP 06/02/2017, 12:47 PM

## 2017-06-02 NOTE — ED Notes (Signed)
When questioned if pt still wants to harm himself, he replies, "kinda." Pt is A&O and in NAD. Sitter at bedside

## 2017-06-02 NOTE — ED Notes (Signed)
TTS at bedside. 

## 2017-06-16 ENCOUNTER — Emergency Department (HOSPITAL_COMMUNITY)
Admission: EM | Admit: 2017-06-16 | Discharge: 2017-06-17 | Disposition: A | Discharge status: Home / Self Care | Payer: Medicaid Other | Attending: Emergency Medicine | Admitting: Emergency Medicine

## 2017-06-16 ENCOUNTER — Encounter (HOSPITAL_COMMUNITY): Payer: Self-pay

## 2017-06-16 ENCOUNTER — Other Ambulatory Visit: Payer: Self-pay

## 2017-06-16 DIAGNOSIS — F419 Anxiety disorder, unspecified: Secondary | ICD-10-CM | POA: Diagnosis not present

## 2017-06-16 DIAGNOSIS — F1014 Alcohol abuse with alcohol-induced mood disorder: Secondary | ICD-10-CM | POA: Insufficient documentation

## 2017-06-16 DIAGNOSIS — R748 Abnormal levels of other serum enzymes: Secondary | ICD-10-CM | POA: Insufficient documentation

## 2017-06-16 DIAGNOSIS — F17210 Nicotine dependence, cigarettes, uncomplicated: Secondary | ICD-10-CM | POA: Insufficient documentation

## 2017-06-16 DIAGNOSIS — F10920 Alcohol use, unspecified with intoxication, uncomplicated: Secondary | ICD-10-CM

## 2017-06-16 DIAGNOSIS — Z79899 Other long term (current) drug therapy: Secondary | ICD-10-CM | POA: Insufficient documentation

## 2017-06-16 DIAGNOSIS — F333 Major depressive disorder, recurrent, severe with psychotic symptoms: Secondary | ICD-10-CM | POA: Diagnosis not present

## 2017-06-16 DIAGNOSIS — R45851 Suicidal ideations: Secondary | ICD-10-CM | POA: Insufficient documentation

## 2017-06-16 DIAGNOSIS — J449 Chronic obstructive pulmonary disease, unspecified: Secondary | ICD-10-CM | POA: Insufficient documentation

## 2017-06-16 DIAGNOSIS — F1290 Cannabis use, unspecified, uncomplicated: Secondary | ICD-10-CM | POA: Diagnosis not present

## 2017-06-16 DIAGNOSIS — Z008 Encounter for other general examination: Secondary | ICD-10-CM | POA: Diagnosis present

## 2017-06-16 DIAGNOSIS — Z5900 Homelessness unspecified: Secondary | ICD-10-CM

## 2017-06-16 DIAGNOSIS — F329 Major depressive disorder, single episode, unspecified: Secondary | ICD-10-CM | POA: Diagnosis not present

## 2017-06-16 LAB — RAPID URINE DRUG SCREEN, HOSP PERFORMED
AMPHETAMINES: NOT DETECTED
BARBITURATES: NOT DETECTED
BENZODIAZEPINES: NOT DETECTED
Cocaine: NOT DETECTED
Opiates: NOT DETECTED
Tetrahydrocannabinol: NOT DETECTED

## 2017-06-16 LAB — CBC WITH DIFFERENTIAL/PLATELET
BASOS PCT: 1 %
Basophils Absolute: 0 10*3/uL (ref 0.0–0.1)
EOS ABS: 0.1 10*3/uL (ref 0.0–0.7)
EOS PCT: 2 %
HCT: 44.3 % (ref 39.0–52.0)
HEMOGLOBIN: 15.3 g/dL (ref 13.0–17.0)
Lymphocytes Relative: 44 %
Lymphs Abs: 2.1 10*3/uL (ref 0.7–4.0)
MCH: 31.3 pg (ref 26.0–34.0)
MCHC: 34.5 g/dL (ref 30.0–36.0)
MCV: 90.6 fL (ref 78.0–100.0)
MONO ABS: 0.5 10*3/uL (ref 0.1–1.0)
Monocytes Relative: 10 %
Neutro Abs: 2 10*3/uL (ref 1.7–7.7)
Neutrophils Relative %: 43 %
Platelets: 58 10*3/uL — ABNORMAL LOW (ref 150–400)
RBC: 4.89 MIL/uL (ref 4.22–5.81)
RDW: 17.2 % — ABNORMAL HIGH (ref 11.5–15.5)
WBC: 4.7 10*3/uL (ref 4.0–10.5)

## 2017-06-16 LAB — COMPREHENSIVE METABOLIC PANEL
ALBUMIN: 4 g/dL (ref 3.5–5.0)
ALT: 105 U/L — AB (ref 17–63)
AST: 200 U/L — AB (ref 15–41)
Alkaline Phosphatase: 99 U/L (ref 38–126)
Anion gap: 12 (ref 5–15)
BILIRUBIN TOTAL: 0.4 mg/dL (ref 0.3–1.2)
BUN: 6 mg/dL (ref 6–20)
CHLORIDE: 106 mmol/L (ref 101–111)
CO2: 25 mmol/L (ref 22–32)
Calcium: 8.5 mg/dL — ABNORMAL LOW (ref 8.9–10.3)
Creatinine, Ser: 0.89 mg/dL (ref 0.61–1.24)
GFR calc Af Amer: >60 mL/min (ref >60)
GFR calc non Af Amer: >60 mL/min (ref >60)
GLUCOSE: 82 mg/dL (ref 65–99)
Potassium: 4.2 mmol/L (ref 3.5–5.1)
Sodium: 143 mmol/L (ref 135–145)
TOTAL PROTEIN: 7 g/dL (ref 6.5–8.1)

## 2017-06-16 LAB — ETHANOL: Alcohol, Ethyl (B): 371 mg/dL (ref <10)

## 2017-06-16 MED ORDER — LORAZEPAM 1 MG PO TABS: 0.0000 mg | ORAL_TABLET | Freq: Two times a day (BID) | ORAL | Status: DC

## 2017-06-16 MED ORDER — THIAMINE HCL 100 MG/ML IJ SOLN: 100.0000 mg | Freq: Every day | INTRAMUSCULAR | Status: DC

## 2017-06-16 MED ORDER — GABAPENTIN 300 MG PO CAPS
300.0000 mg | ORAL_CAPSULE | Freq: Two times a day (BID) | ORAL | Status: DC
  Administered 2017-06-16 – 2017-06-17 (×2): 300 mg via ORAL
  Filled 2017-06-16 (×2): qty 1

## 2017-06-16 MED ORDER — MIRTAZAPINE 30 MG PO TABS
15.0000 mg | ORAL_TABLET | Freq: Every day | ORAL | Status: DC
Start: 2017-06-16 — End: 2017-06-17
  Administered 2017-06-16: 15 mg via ORAL
  Filled 2017-06-16: qty 1

## 2017-06-16 MED ORDER — HYDROXYZINE HCL 25 MG PO TABS: 25.0000 mg | ORAL_TABLET | Freq: Four times a day (QID) | ORAL | Status: DC | PRN

## 2017-06-16 MED ORDER — LORAZEPAM 2 MG/ML IJ SOLN: 0.0000 mg | Freq: Two times a day (BID) | INTRAMUSCULAR | Status: DC

## 2017-06-16 MED ORDER — QUETIAPINE FUMARATE 100 MG PO TABS
100.0000 mg | ORAL_TABLET | Freq: Every day | ORAL | Status: DC
  Administered 2017-06-16: 100 mg via ORAL
  Filled 2017-06-16: qty 1

## 2017-06-16 MED ORDER — FLUOXETINE HCL 20 MG PO CAPS
20.0000 mg | ORAL_CAPSULE | Freq: Every day | ORAL | Status: DC
  Administered 2017-06-16 – 2017-06-17 (×2): 20 mg via ORAL
  Filled 2017-06-16 (×2): qty 1

## 2017-06-16 MED ORDER — LORAZEPAM 1 MG PO TABS
0.0000 mg | ORAL_TABLET | Freq: Four times a day (QID) | ORAL | Status: DC
  Administered 2017-06-16: 1 mg via ORAL
  Filled 2017-06-16 (×2): qty 1
  Filled 2017-06-16: qty 3

## 2017-06-16 MED ORDER — LORAZEPAM 2 MG/ML IJ SOLN: 1.0000 mg | Freq: Once | INTRAMUSCULAR | Status: DC

## 2017-06-16 MED ORDER — LORAZEPAM 2 MG/ML IJ SOLN
0.0000 mg | Freq: Four times a day (QID) | INTRAMUSCULAR | Status: DC
  Administered 2017-06-17: 3 mg via INTRAVENOUS
  Administered 2017-06-17: 1 mg via INTRAVENOUS

## 2017-06-16 MED ORDER — LISINOPRIL 10 MG PO TABS
10.0000 mg | ORAL_TABLET | Freq: Every day | ORAL | Status: DC
  Administered 2017-06-16 – 2017-06-17 (×2): 10 mg via ORAL
  Filled 2017-06-16 (×2): qty 1

## 2017-06-16 MED ORDER — PHENYTOIN SODIUM EXTENDED 100 MG PO CAPS
100.0000 mg | ORAL_CAPSULE | Freq: Three times a day (TID) | ORAL | Status: DC
Start: 2017-06-16 — End: 2017-06-17
  Administered 2017-06-16 – 2017-06-17 (×2): 100 mg via ORAL
  Filled 2017-06-16 (×2): qty 1

## 2017-06-16 MED ORDER — VITAMIN B-1 100 MG PO TABS
100.0000 mg | ORAL_TABLET | Freq: Every day | ORAL | Status: DC
  Administered 2017-06-16 – 2017-06-17 (×2): 100 mg via ORAL
  Filled 2017-06-16 (×2): qty 1
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
## 2017-06-16 NOTE — ED Notes (Signed)
Date and time results received: 06/16/17 1935  Test: ETOH Critical Value: 371  Name of Provider Notified: Estell Harpin, MD  Orders Received? Or Actions Taken?: Actions Taken: none
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
## 2017-06-16 NOTE — ED Provider Notes (Signed)
[facility]-EMERGENCY DEPT Provider Note   CSN: [id number] Arrival date & time: 06/16/17  1735     History   Chief Complaint Chief Complaint  Patient presents with  . Alcohol Intoxication    HPI Rodney Watson is a 52 y.o. male.  The history is provided by the patient and medical records. No language interpreter was used.  Alcohol Intoxication  Pertinent negatives include no headaches.    Rodney Watson is a 52 y.o. male  with a PMH of alcohol abuse, seizures who presents to the Emergency Department for suicidal thoughts and alcohol abuse, requesting detox.  Per EMS, they were called for an unknown person laying on the sidewalk.  When firing [employer] responded, patient endorsed alcohol abuse and was awake and cooperative on the scene.  Patient reports to me that he was faking being unconscious so that someone would call 911 and bring him to the hospital.  He wants help and thought this would get him some help.  He does endorse drinking 4, 40 ounce beers today last one at 3 PM.  He feels as if his hands are starting to shake due to withdrawals.  He denies any auditory or visual hallucinations.  When asked about suicidal thoughts, he states that he wants to jump in front of a freight train because that would be quick and easy.  He denies homicidal thoughts.  States that he is homeless and "tired of it all".  He has not been able to get any of his home medications in the last couple of weeks either.  Past Medical History:  Diagnosis Date  . Alcohol abuse   . COPD (chronic obstructive pulmonary disease) (HCC)   . Depression   . ETOH abuse   . Seizures [facility])     Patient Active Problem List   Diagnosis Date Noted  . MDD (major depressive disorder), recurrent episode, severe (HCC) 04/13/2017  . Alcohol abuse with alcohol-induced mood disorder (HCC) 04/10/2017  . Alcohol abuse 04/01/2017    History reviewed. No pertinent surgical history.      Home Medications     Prior to Admission medications   Medication Sig Start Date End Date Taking? Authorizing Provider  FLUoxetine (PROZAC) 20 MG capsule Take 1 capsule (20 mg total) by mouth daily. 06/02/17  Yes Laveda Abbe, NP  gabapentin (NEURONTIN) 300 MG capsule Take 1 capsule (300 mg total) by mouth 2 (two) times daily. 06/02/17  Yes Laveda Abbe, NP  hydrOXYzine (ATARAX/VISTARIL) 25 MG tablet Take 1 tablet (25 mg total) by mouth every 6 (six) hours as needed for anxiety. 05/20/17  Yes Oneta Rack, NP  lisinopril (PRINIVIL,ZESTRIL) 10 MG tablet Take 1 tablet (10 mg total) by mouth daily. For high blood pressure 04/17/17  Yes Nwoko, Agnes I, NP  mirtazapine (REMERON) 15 MG tablet Take 1 tablet (15 mg total) by mouth at bedtime. 05/20/17  Yes Oneta Rack, NP  phenytoin (DILANTIN) 100 MG ER capsule Take 1 capsule (100 mg total) by mouth 3 (three) times daily. 06/02/17  Yes Laveda Abbe, NP  QUEtiapine (SEROQUEL) 100 MG tablet Take 1 tablet (100 mg total) by mouth at bedtime. 06/02/17  Yes Laveda Abbe, NP    Family History History reviewed. No pertinent family history.  Social History Social History   Tobacco Use  . Smoking status: Current Every Day Smoker    Packs/day: 1.50    Years: 39.00    Pack years: 58.50    Types:  Cigarettes  . Smokeless tobacco: Current User    Types: Chew  Substance Use Topics  . Alcohol use: Yes    Comment: 5th daily  . Drug use: Yes    Types: Marijuana    Comment: 1-2 joints/week     Allergies   Chocolate; Chocolate; and Vicodin [hydrocodone-acetaminophen]   Review of Systems Review of Systems  Neurological: Positive for tremors. Negative for dizziness, syncope, weakness and headaches.  Psychiatric/Behavioral: Positive for suicidal ideas.  All other systems reviewed and are negative.    Physical Exam Updated Vital Signs BP 105/61   Pulse (!) 101   Temp 98.5 F (36.9 C) (Oral)   Resp 17   Ht  (1.727 m)   Wt  60.8 kg (134 lb)   SpO2 97%   BMI 20.37 kg/m   Physical Exam  Constitutional: He is oriented to person, place, and time. He appears well-developed and well-nourished. No distress.  HENT:  Head: Normocephalic and atraumatic.  Cardiovascular: Normal rate, regular rhythm and normal heart sounds.  No murmur heard. Pulmonary/Chest: Effort normal and breath sounds normal. No respiratory distress.  Abdominal: Soft. He exhibits no distension. There is no tenderness.  Musculoskeletal: Normal range of motion.  Neurological: He is alert and oriented to person, place, and time.  Mild tremors.  Skin: Skin is warm and dry.  Nursing note and vitals reviewed.    ED Treatments / Results  Labs (all labs ordered are listed, but only abnormal results are displayed) Labs Reviewed  CBC WITH DIFFERENTIAL/PLATELET - Abnormal; Notable for the following components:      Result Value   RDW 17.2 (*)    All other components within normal limits  COMPREHENSIVE METABOLIC PANEL - Abnormal; Notable for the following components:   Calcium 8.5 (*)    AST 200 (*)    ALT 105 (*)    All other components within normal limits  ETHANOL - Abnormal; Notable for the following components:   Alcohol, Ethyl (B) 371 (*)    All other components within normal limits  RAPID URINE DRUG SCREEN, HOSP PERFORMED    EKG None  Radiology No results found.  Procedures Procedures (including critical care time)  Medications Ordered in ED Medications  LORazepam (ATIVAN) injection 1 mg (has no administration in time range)  LORazepam (ATIVAN) injection 0-4 mg (0 mg Intravenous Not Given 06/16/17 1927)    Or  LORazepam (ATIVAN) tablet 0-4 mg ( Oral See Alternative 06/16/17 1927)  LORazepam (ATIVAN) injection 0-4 mg (has no administration in time range)    Or  LORazepam (ATIVAN) tablet 0-4 mg (has no administration in time range)  thiamine (VITAMIN B-1) tablet 100 mg (has no administration in time range)    Or  thiamine  (B-1) injection 100 mg (has no administration in time range)  FLUoxetine (PROZAC) capsule 20 mg (has no administration in time range)  gabapentin (NEURONTIN) capsule 300 mg (has no administration in time range)  hydrOXYzine (ATARAX/VISTARIL) tablet 25 mg (has no administration in time range)  lisinopril (PRINIVIL,ZESTRIL) tablet 10 mg (has no administration in time range)  mirtazapine (REMERON) tablet 15 mg (has no administration in time range)  phenytoin (DILANTIN) ER capsule 100 mg (has no administration in time range)  QUEtiapine (SEROQUEL) tablet 100 mg (has no administration in time range)     Initial Impression / Assessment and Plan / ED Course  I have reviewed the triage vital signs and the nursing notes.  Pertinent labs & imaging results that were  available during my care of the patient were reviewed by me and considered in my medical decision making (see chart for details).    Ontario Pettengill is a 52 y.o. male who presents to ED for suicidal thoughts, alcohol intoxication requesting help with detox. He reports plan to step in front of a freight train to end his life. Afebrile, vital signs stable. Intermittent distractible tremors. Given  Ativan. No signs of acute ETOH withdrawal. Labs reviewed: ETOH of 371. Placed on CIWA protocol. Elevated AST / ALT as well.  No abdominal tenderness on exam. Will need follow up for repeat liver enzymes. Medically cleared with disposition per TTS recommendations.   Patient discussed with Dr. Juleen China who agrees with treatment plan.    Final Clinical Impressions(s) / ED Diagnoses   Final diagnoses:  Alcoholic intoxication without complication (HCC)  Suicidal thoughts  Elevated liver enzymes  Homeless    ED Discharge Orders    None       Ward, Chase Picket, PA-C 06/16/17 1944    Raeford Razor, MD 06/17/17 [id number]

## 2017-06-16 NOTE — ED Notes (Signed)
Pt moved to Rm 29, dressing out in paper scrubs. Ambulated with min assistance

## 2017-06-17 DIAGNOSIS — F329 Major depressive disorder, single episode, unspecified: Secondary | ICD-10-CM

## 2017-06-17 DIAGNOSIS — F419 Anxiety disorder, unspecified: Secondary | ICD-10-CM

## 2017-06-17 DIAGNOSIS — F1290 Cannabis use, unspecified, uncomplicated: Secondary | ICD-10-CM

## 2017-06-17 DIAGNOSIS — F1014 Alcohol abuse with alcohol-induced mood disorder: Secondary | ICD-10-CM

## 2017-06-17 DIAGNOSIS — F17220 Nicotine dependence, chewing tobacco, uncomplicated: Secondary | ICD-10-CM

## 2017-06-17 DIAGNOSIS — F17210 Nicotine dependence, cigarettes, uncomplicated: Secondary | ICD-10-CM

## 2017-06-17 MED ORDER — GABAPENTIN 300 MG PO CAPS
300.0000 mg | ORAL_CAPSULE | Freq: Three times a day (TID) | ORAL | Status: DC
Start: 2017-06-17 — End: 2017-06-17

## 2017-06-17 MED ORDER — GABAPENTIN 300 MG PO CAPS: 300.0000 mg | ORAL_CAPSULE | Freq: Three times a day (TID) | ORAL | 0 refills | Status: DC

## 2017-06-17 NOTE — BH Assessment (Signed)
[facility] Assessment Progress Note   06/17/17 Per Jamison Lord, DNP, patient can be discharged with outpatient resources.    

## 2017-06-17 NOTE — Patient Outreach (Signed)
ED Peer Support Specialist Patient Intake (Complete at intake & 30-60 Day Follow-up)  Name: Rodney Watson  MRN: [id number]  Age: 52 y.o.   Date of Admission: 06/17/2017  Intake: Initial Comments:      Primary Reason Admitted: is an 52 y.o. male, who presents voluntarily and unaccompanied to [facility]. Clinician asked the pt, "what brought you to the hospital?" Pt reported, "I need to go to detox, I'm tried of being an alcoholic." Pt reported, "I have epilepsy." Pt asked clinician, "is it wrong with wanting to die?" Pt reported, he was suicidal with a plan of jumping in front of a freight train, to be quick and easy. Pt reported, in 1999 he slit his wrist as a suicide attempt. Pt reported, his mother died in 47 and when he is in trouble she visits him. Pt denies, HI, self-injurious behaviors and access to weapons    Lab values: Alcohol/ETOH: Positive Positive UDS? 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 Yes  Number of ED visits:    Reason(s) for visit: [place]  In past year, has patient been hospitalized for any reason? No  Number of hospitalizations:    Reason(s) for hospitalization:    In past year, has patient been arrested? Yes  Number of arrests:    Reason(s) for arrest:    In past year, has patient been incarcerated? No  Number of incarcerations:    Reason(s) for incarceration:    In past year, has patient received medication-assisted treatment? No  In past year, patient  received the following treatments:    In past year, has patient received any harm reduction services? No  Did this include any of the following?    In past year, has patient received care from a mental health provider for diagnosis other than SUD? No  In past year, is this first time patient has overdosed? No  Number of past overdoses:    In past year, is this first time patient has been hospitalized for an overdose? No  Number of hospitalizations for overdose(s):    Is patient currently receiving treatment for a mental health diagnosis? No  Patient reports experiencing difficulty participating in SUD treatment: No    Most important reason(s) for this difficulty?    Has patient received prior services for treatment? No  In past, patient has received services from following agencies:    Plan of Care:  Suggested follow up at these agencies/treatment centers: [facility]  Other information: CPSS met with Pt and was made aware that Pt has been to [facility] for detox and was told to leave. CPSS was made aware that Pt wants help and does not have any sources. CPSS addressed the fact that Pt maybe able to get into [facility] but will have to complete a phone assessment. CPSS made Pt aware that it will be a few hours before hearing back from [facility].    Aaron Edelman Terricka Onofrio, CPSS  06/17/2017 12:11 PM

## 2017-06-17 NOTE — Consult Note (Addendum)
[facility] Consult   Reason for Consult:  Alcohol intoxications Referring Physician:  EDP Patient Identification: Tino Ronan MRN:  [id number] Principal Diagnosis: Alcohol abuse with alcohol-induced mood disorder [facility]) Diagnosis:   Patient Active Problem List   Diagnosis Date Noted  . Alcohol abuse with alcohol-induced mood disorder ([place]) [F10.14] 04/10/2017    Priority: High  . MDD (major depressive disorder), recurrent episode, severe ([place]) [F33.2] 04/13/2017  . Alcohol abuse [F10.10] 04/01/2017    Total Time spent with patient: 45 minutes  Subjective:   Lillard Bailon is a 52 y.o. male patient does not warrant admission.  HPI:  52 yo male who presented to the ED under the influence of alcohol.  Today, he is clear and coherent.  No suicidal/homicidal ideations, hallucinations, or withdrawal symptoms.  Peer support consult placed.  Stable for discharge.  Past Psychiatric History: depression, alcohol abuse  Risk to Self: None Risk to Others: Homicidal Ideation: No(Pt denies. ) Thoughts of Harm to Others: No(Pt denies. ) Current Homicidal Intent: No Current Homicidal Plan: No Access to Homicidal Means: No Identified Victim: NA History of harm to others?: No(Pt denies. ) Assessment of Violence: None Noted Violent Behavior Description: NA Does patient have access to weapons?: No(Pt denies. ) Criminal Charges Pending?: No Does patient have a court date: No Prior Inpatient Therapy: Prior Inpatient Therapy: Yes Prior Therapy Dates: Per chart, 02/2017; 2009, 04/2017, 03/2017. Prior Therapy Facilty/Provider(s): Per chart, [facility]. Reason for Treatment: Per chart, alcohol use, SI. Prior Outpatient Therapy: Prior Outpatient Therapy: No Prior Therapy Dates: Current Prior Therapy Facilty/Provider(s): [facility] Path [facility].  Reason for Treatment: Provide and link to community resources. Does patient have an ACCT team?: No Does patient have Intensive In-House Services?  :  No Does patient have [facility] services? : No Does patient have P4CC services?: No  Past Medical History:  Past Medical History:  Diagnosis Date  . Alcohol abuse   . COPD (chronic obstructive pulmonary disease) ([place])   . Depression   . ETOH abuse   . Seizures ([place])    History reviewed. No pertinent surgical history. Family History: History reviewed. No pertinent family history. Family Psychiatric  History: None Social History:  Social History   Substance and Sexual Activity  Alcohol Use Yes   Comment: 5th daily     Social History   Substance and Sexual Activity  Drug Use Yes  . Types: Marijuana   Comment: 1-2 joints/week    Social History   Socioeconomic History  . Marital status: Single    Spouse name: Not on file  . Number of children: Not on file  . Years of education: Not on file  . Highest education level: Not on file  Occupational History  . Not on file  Social Needs  . Financial resource strain: Not on file  . Food insecurity:    Worry: Not on file    Inability: Not on file  . Transportation needs:    Medical: Not on file    Non-medical: Not on file  Tobacco Use  . Smoking status: Current Every Day Smoker    Packs/day: 1.50    Years: 39.00    Pack years: 58.50    Types: Cigarettes  . Smokeless tobacco: Current User    Types: Chew  Substance and Sexual Activity  . Alcohol use: Yes    Comment: 5th daily  . Drug use: Yes    Types: Marijuana    Comment: 1-2 joints/week  . Sexual activity: Never  Lifestyle  . Physical activity:    Days per week: Not on file    Minutes per session: Not on file  . Stress: Not on file  Relationships  . Social connections:    Talks on phone: Not on file    Gets together: Not on file    Attends religious service: Not on file    Active member of club or organization: Not on file    Attends meetings of clubs or organizations: Not on file    Relationship status: Not on file  Other Topics Concern  . Not on file   Social History Narrative   ** Merged History Encounter **       Additional Social History: N/A    Allergies:   Allergies  Allergen Reactions  . Chocolate   . Chocolate Hives  . Vicodin [Hydrocodone-Acetaminophen] Nausea And Vomiting    Labs:  Results for orders placed or performed during the hospital encounter of 06/16/17 (from the past 48 hour(s))  CBC with Differential     Status: Abnormal   Collection Time: 06/16/17  6:17 PM  Result Value Ref Range   WBC 4.7 4.0 - 10.5 K/uL   RBC 4.89 4.22 - 5.81 MIL/uL   Hemoglobin 15.3 13.0 - 17.0 g/dL   HCT 44.3 39.0 - 52.0 %   MCV 90.6 78.0 - 100.0 fL   MCH 31.3 26.0 - 34.0 pg   MCHC 34.5 30.0 - 36.0 g/dL   RDW 17.2 (H) 11.5 - 15.5 %   Platelets 58 (L) 150 - 400 K/uL    Comment: PLATELET COUNT CONFIRMED BY SMEAR SPECIMEN CHECKED FOR CLOTS    Neutrophils Relative % 43 %   Lymphocytes Relative 44 %   Monocytes Relative 10 %   Eosinophils Relative 2 %   Basophils Relative 1 %   Neutro Abs 2.0 1.7 - 7.7 K/uL   Lymphs Abs 2.1 0.7 - 4.0 K/uL   Monocytes Absolute 0.5 0.1 - 1.0 K/uL   Eosinophils Absolute 0.1 0.0 - 0.7 K/uL   Basophils Absolute 0.0 0.0 - 0.1 K/uL   RBC Morphology ELLIPTOCYTES     Comment: Performed at [facility], [place] [address] [place], [place] [address]  Comprehensive metabolic panel     Status: Abnormal   Collection Time: 06/16/17  6:17 PM  Result Value Ref Range   Sodium 143 135 - 145 mmol/L   Potassium 4.2 3.5 - 5.1 mmol/L   Chloride 106 101 - 111 mmol/L   CO2 25 22 - 32 mmol/L   Glucose, Bld 82 65 - 99 mg/dL   BUN 6 6 - 20 mg/dL   Creatinine, Ser 0.89 0.61 - 1.24 mg/dL   Calcium 8.5 (L) 8.9 - 10.3 mg/dL   Total Protein 7.0 6.5 - 8.1 g/dL   Albumin 4.0 3.5 - 5.0 g/dL   AST 200 (H) 15 - 41 U/L   ALT 105 (H) 17 - 63 U/L   Alkaline Phosphatase 99 38 - 126 U/L   Total Bilirubin 0.4 0.3 - 1.2 mg/dL   GFR calc non Af Amer >60 >60 mL/min   GFR calc Af Amer >60 >60 mL/min    Comment:  (NOTE) The eGFR has been calculated using the CKD EPI equation. This calculation has not been validated in all clinical situations. eGFR's persistently <60 mL/min signify possible Chronic Kidney Disease.    Anion gap 12 5 - 15    Comment: Performed at [facility], [place] [address] [place], [place] [address]  Ethanol     Status: Abnormal   Collection Time: 06/16/17  6:17 PM  Result Value Ref Range   Alcohol, Ethyl (B) 371 (HH) <10 mg/dL    Comment:        LOWEST DETECTABLE LIMIT FOR SERUM ALCOHOL IS 10 mg/dL FOR MEDICAL PURPOSES ONLY CRITICAL RESULT CALLED TO, READ BACK BY AND VERIFIED WITHRomualdo Bolk RN [id number] ON 06/16/17 [place] Performed at [facility], [place] [address], [place], [place] [address]   Urine rapid drug screen (hosp performed)     Status: None   Collection Time: 06/16/17  6:29 PM  Result Value Ref Range   Opiates NONE DETECTED NONE DETECTED   Cocaine NONE DETECTED NONE DETECTED   Benzodiazepines NONE DETECTED NONE DETECTED   Amphetamines NONE DETECTED NONE DETECTED   Tetrahydrocannabinol NONE DETECTED NONE DETECTED   Barbiturates NONE DETECTED NONE DETECTED    Comment: (NOTE) DRUG SCREEN FOR MEDICAL PURPOSES ONLY.  IF CONFIRMATION IS NEEDED FOR ANY PURPOSE, NOTIFY LAB WITHIN 5 DAYS. LOWEST DETECTABLE LIMITS FOR URINE DRUG SCREEN Drug Class                     Cutoff (ng/mL) Amphetamine and metabolites    1000 Barbiturate and metabolites    200 Benzodiazepine                 790 Tricyclics and metabolites     300 Opiates and metabolites        300 Cocaine and metabolites        300 THC                            50 Performed at [facility], [place] [address], [place],  [address]     Current Facility-Administered Medications  Medication Dose Route Frequency Provider Last Rate Last Dose  . FLUoxetine (PROZAC) capsule 20 mg  20 mg Oral Daily Ward, Ozella Almond, PA-C   20 mg at 06/17/17 0951   . gabapentin (NEURONTIN) capsule 300 mg  300 mg Oral TID Patrecia Pour, NP      . hydrOXYzine (ATARAX/VISTARIL) tablet 25 mg  25 mg Oral Q6H PRN Ward, Ozella Almond, PA-C      . lisinopril (PRINIVIL,ZESTRIL) tablet 10 mg  10 mg Oral Daily Ward, Ozella Almond, PA-C   10 mg at 06/17/17 0951  . LORazepam (ATIVAN) injection 0-4 mg  0-4 mg Intravenous Q6H Ward, Ozella Almond, PA-C   1 mg at 06/17/17 1004   Or  . LORazepam (ATIVAN) tablet 0-4 mg  0-4 mg Oral Q6H Ward, Ozella Almond, PA-C   1 mg at 06/16/17 2137  . [START ON 06/19/2017] LORazepam (ATIVAN) injection 0-4 mg  0-4 mg Intravenous Q12H Ward, Ozella Almond, PA-C       Or  . Derrill Memo ON 06/19/2017] LORazepam (ATIVAN) tablet 0-4 mg  0-4 mg Oral Q12H Ward, Ozella Almond, PA-C      . LORazepam (ATIVAN) injection 1 mg  1 mg Intramuscular Once Ward, Ozella Almond, PA-C      . mirtazapine (REMERON) tablet 15 mg  15 mg Oral QHS Ward, Ozella Almond, PA-C   15 mg at 06/16/17 2136  . phenytoin (DILANTIN) ER capsule 100 mg  100 mg Oral TID Ward, Ozella Almond, PA-C   100 mg at 06/17/17 0951  . QUEtiapine (SEROQUEL) tablet 100 mg  100 mg Oral QHS Ward,  Ozella Almond, PA-C   100 mg at 06/16/17 2134  . thiamine (VITAMIN B-1) tablet 100 mg  100 mg Oral Daily Ward, Ozella Almond, PA-C   100 mg at 06/17/17 9038   Or  . thiamine (B-1) injection 100 mg  100 mg Intravenous Daily Ward, Ozella Almond, PA-C       Current Outpatient Medications  Medication Sig Dispense Refill  . FLUoxetine (PROZAC) 20 MG capsule Take 1 capsule (20 mg total) by mouth daily. 30 capsule 0  . gabapentin (NEURONTIN) 300 MG capsule Take 1 capsule (300 mg total) by mouth 2 (two) times daily. 60 capsule 0  . hydrOXYzine (ATARAX/VISTARIL) 25 MG tablet Take 1 tablet (25 mg total) by mouth every 6 (six) hours as needed for anxiety. 30 tablet 0  . lisinopril (PRINIVIL,ZESTRIL) 10 MG tablet Take 1 tablet (10 mg total) by mouth daily. For high blood pressure 30 tablet 0  . mirtazapine (REMERON) 15  MG tablet Take 1 tablet (15 mg total) by mouth at bedtime. 30 tablet 0  . phenytoin (DILANTIN) 100 MG ER capsule Take 1 capsule (100 mg total) by mouth 3 (three) times daily. 90 capsule 0  . QUEtiapine (SEROQUEL) 100 MG tablet Take 1 tablet (100 mg total) by mouth at bedtime. 30 tablet 0    Musculoskeletal: Strength & Muscle Tone: within normal limits Gait & Station: normal Patient leans: N/A  Psychiatric Specialty Exam: Physical Exam  Nursing note and vitals reviewed. Constitutional: He is oriented to person, place, and time. He appears well-developed and well-nourished.  HENT:  Head: Normocephalic and atraumatic.  Neck: Normal range of motion.  Respiratory: Effort normal.  Musculoskeletal: Normal range of motion.  Neurological: He is alert and oriented to person, place, and time.  Psychiatric: He has a normal mood and affect. His speech is normal and behavior is normal. Judgment and thought content normal. Cognition and memory are normal.    Review of Systems  Psychiatric/Behavioral: Positive for substance abuse.  All other systems reviewed and are negative.   Blood pressure 109/64, pulse 79, temperature 98.5 F (36.9 C), temperature source Oral, resp. rate 19, height 5' 8"  (1.727 m), weight 60.8 kg (134 lb), SpO2 97 %.Body mass index is 20.37 kg/m.  General Appearance: Disheveled  Eye Contact:  Good  Speech:  Normal Rate  Volume:  Normal  Mood:  Euthymic  Affect:  Congruent  Thought Process:  Coherent and Descriptions of Associations: Intact  Orientation:  Full (Time, Place, and Person)  Thought Content:  WDL and Logical  Suicidal Thoughts:  No  Homicidal Thoughts:  No  Memory:  Immediate;   Good Recent;   Good Remote;   Good  Judgement:  Fair  Insight:  Fair  Psychomotor Activity:  Normal  Concentration:  Concentration: Good and Attention Span: Good  Recall:  Good  Fund of Knowledge:  Fair  Language:  Good  Akathisia:  No  Handed:  Right  AIMS (if indicated):    N/A  Assets:  Leisure Time Physical Health Resilience  ADL's:  Intact  Cognition:  WNL  Sleep:   N/A     Treatment Plan Summary: Daily contact with patient to assess and evaluate symptoms and progress in treatment, Medication management and Plan alcohol abuse with alcohol induced mood disorder:  -Crisis stabilization -Medication management:  Ativan alcohol detox protocol started along with Gabapentin 300 mg TID for alcohol withdrawal symptoms, Prozac 20 mg daily for depression, Seroquel 100 mg at bedtime for mood and sleep, Remeron  15 mg at bedtime for sleep, hydroxyzine 25 mg every six hours PRN anxiety along with medical medications -Individual counseling  Disposition: No evidence of imminent risk to self or others at present.    Waylan Boga, NP 06/17/2017 11:17 AM   Patient seen face-to-face for psychiatric evaluation, chart reviewed and case discussed with the physician extender and developed treatment plan. Reviewed the information documented and agree with the treatment plan.  Buford Dresser, DO 06/17/17 1:10 PM

## 2017-06-17 NOTE — [facility] Suicide Risk Assessment (Signed)
Suicide Risk Assessment  Discharge Assessment   [facility] Discharge Suicide Risk Assessment   Principal Problem: Alcohol abuse with alcohol-induced mood disorder [facility]) Discharge Diagnoses:  Patient Active Problem List   Diagnosis Date Noted  . Alcohol abuse with alcohol-induced mood disorder (HCC) [F10.14] 04/10/2017    Priority: High  . MDD (major depressive disorder), recurrent episode, severe (HCC) [F33.2] 04/13/2017  . Alcohol abuse [F10.10] 04/01/2017    Total Time spent with patient: 45 minutes   Musculoskeletal: Strength & Muscle Tone: within normal limits Gait & Station: normal Patient leans: N/A  Psychiatric Specialty Exam: Physical Exam  Constitutional: He is oriented to person, place, and time. He appears well-developed and well-nourished.  HENT:  Head: Normocephalic.  Neck: Normal range of motion.  Respiratory: Effort normal.  Musculoskeletal: Normal range of motion.  Neurological: He is alert and oriented to person, place, and time.  Psychiatric: He has a normal mood and affect. His speech is normal and behavior is normal. Judgment and thought content normal. Cognition and memory are normal.    Review of Systems  Psychiatric/Behavioral: Positive for substance abuse.  All other systems reviewed and are negative.   Blood pressure 109/64, pulse 79, temperature 98.5 F (36.9 C), temperature source Oral, resp. rate 19, height  (1.727 m), weight 60.8 kg (134 lb), SpO2 97 %.Body mass index is 20.37 kg/m.  General Appearance: Disheveled  Eye Contact:  Good  Speech:  Normal Rate  Volume:  Normal  Mood:  Euthymic  Affect:  Congruent  Thought Process:  Coherent and Descriptions of Associations: Intact  Orientation:  Full (Time, Place, and Person)  Thought Content:  WDL and Logical  Suicidal Thoughts:  No  Homicidal Thoughts:  No  Memory:  Immediate;   Good Recent;   Good Remote;   Good  Judgement:  Fair  Insight:  Fair  Psychomotor Activity:  Normal   Concentration:  Concentration: Good and Attention Span: Good  Recall:  Good  Fund of Knowledge:  Fair  Language:  Good  Akathisia:  No  Handed:  Right  AIMS (if indicated):     Assets:  Leisure Time Physical Health Resilience  ADL's:  Intact  Cognition:  WNL  Sleep:       Mental Status Per Nursing Assessment::   On Admission:   alcohol intoxication  Demographic Factors:  Male and Caucasian  Loss Factors: NA  Historical Factors: NA  Risk Reduction Factors:   Sense of responsibility to family, Positive social support and Positive therapeutic relationship  Continued Clinical Symptoms:  None  Cognitive Features That Contribute To Risk:  None    Suicide Risk:  Minimal: No identifiable suicidal ideation.  Patients presenting with no risk factors but with morbid ruminations; may be classified as minimal risk based on the severity of the depressive symptoms    Plan Of Care/Follow-up recommendations:  Activity:  as tolerated Diet:  heart healthy diet  LORD, JAMISON, NP 06/17/2017, 11:28 AM

## 2017-06-17 NOTE — Discharge Instructions (Signed)
You will need to follow up with a primary care doctor to get your liver labs rechecked.   For your behavioral health needs, you are advised to follow up with the [facility]:       [facility]      [address] [place], [place] [address]      [phone]

## 2017-06-17 NOTE — BH Assessment (Signed)
[facility] Assessment Progress Note  Per Juanetta Beets, DO, this pt does not require psychiatric hospitalization at this time.  Pt is to be discharged from [facility] with recommendation to continue treatment at the [facility].  This has been included in pt's discharge instructions.  Pt would also benefit from seeing Peer Support Specialists; they will be asked to speak to pt.  Pt's nurse, Donnal Debar, has been notified.  Doylene Canning, MA Triage Specialist [phone]

## 2017-06-27 ENCOUNTER — Other Ambulatory Visit: Payer: Self-pay

## 2017-06-27 ENCOUNTER — Emergency Department (HOSPITAL_COMMUNITY)
Admission: EM | Admit: 2017-06-27 | Discharge: 2017-06-29 | Disposition: A | Discharge status: Other Acute Inpatient Hospital | Payer: Medicaid Other | Attending: Emergency Medicine | Admitting: Emergency Medicine

## 2017-06-27 ENCOUNTER — Encounter (HOSPITAL_COMMUNITY): Payer: Self-pay

## 2017-06-27 DIAGNOSIS — Y908 Blood alcohol level of 240 mg/100 ml or more: Secondary | ICD-10-CM | POA: Diagnosis not present

## 2017-06-27 DIAGNOSIS — R45851 Suicidal ideations: Secondary | ICD-10-CM | POA: Insufficient documentation

## 2017-06-27 DIAGNOSIS — F10129 Alcohol abuse with intoxication, unspecified: Secondary | ICD-10-CM | POA: Diagnosis not present

## 2017-06-27 DIAGNOSIS — F10920 Alcohol use, unspecified with intoxication, uncomplicated: Secondary | ICD-10-CM | POA: Insufficient documentation

## 2017-06-27 DIAGNOSIS — F1014 Alcohol abuse with alcohol-induced mood disorder: Secondary | ICD-10-CM | POA: Diagnosis present

## 2017-06-27 DIAGNOSIS — L03116 Cellulitis of left lower limb: Secondary | ICD-10-CM | POA: Diagnosis not present

## 2017-06-27 DIAGNOSIS — F332 Major depressive disorder, recurrent severe without psychotic features: Secondary | ICD-10-CM | POA: Diagnosis not present

## 2017-06-27 DIAGNOSIS — F329 Major depressive disorder, single episode, unspecified: Secondary | ICD-10-CM | POA: Diagnosis present

## 2017-06-27 MED ORDER — CEPHALEXIN 500 MG PO CAPS
500.0000 mg | ORAL_CAPSULE | Freq: Three times a day (TID) | ORAL | Status: DC
  Administered 2017-06-28: 500 mg via ORAL
  Filled 2017-06-27: qty 1

## 2017-06-27 MED ORDER — PHENYTOIN SODIUM EXTENDED 100 MG PO CAPS
100.0000 mg | ORAL_CAPSULE | Freq: Once | ORAL | Status: AC
  Administered 2017-06-28: 100 mg via ORAL
  Filled 2017-06-27: qty 1

## 2017-06-27 MED ORDER — SODIUM CHLORIDE 0.9 % IV BOLUS
1000.0000 mL | Freq: Once | INTRAVENOUS | Status: AC
  Administered 2017-06-28: 1000 mL via INTRAVENOUS

## 2017-06-27 NOTE — ED Notes (Signed)
Bed: WA06 Expected date:  Expected time:  Means of arrival:  Comments: 52 yo Seizure, out of meds

## 2017-06-27 NOTE — ED Notes (Signed)
Pt reports that he has thoughts of harming himself, that was wants to walk out in front of a train. Pt asks "what's wrong with wanting to die?"

## 2017-06-27 NOTE — ED Triage Notes (Signed)
Per EMS- Pt arrives his homeless [facility] for reports of a seizure yesterday. Pt reports being out of meds. ETOH on board.

## 2017-06-28 LAB — RAPID URINE DRUG SCREEN, HOSP PERFORMED
AMPHETAMINES: NOT DETECTED
BARBITURATES: NOT DETECTED
BENZODIAZEPINES: NOT DETECTED
Cocaine: NOT DETECTED
Opiates: NOT DETECTED
Tetrahydrocannabinol: NOT DETECTED

## 2017-06-28 LAB — COMPREHENSIVE METABOLIC PANEL
ALT: 64 U/L — ABNORMAL HIGH (ref 17–63)
AST: 85 U/L — ABNORMAL HIGH (ref 15–41)
Albumin: 4.3 g/dL (ref 3.5–5.0)
Alkaline Phosphatase: 78 U/L (ref 38–126)
Anion gap: 16 — ABNORMAL HIGH (ref 5–15)
BUN: 9 mg/dL (ref 6–20)
CO2: 19 mmol/L — ABNORMAL LOW (ref 22–32)
CREATININE: 0.7 mg/dL (ref 0.61–1.24)
Calcium: 8.2 mg/dL — ABNORMAL LOW (ref 8.9–10.3)
Chloride: 107 mmol/L (ref 101–111)
GFR calc Af Amer: >60 mL/min (ref >60)
GLUCOSE: 88 mg/dL (ref 65–99)
Potassium: 3.4 mmol/L — ABNORMAL LOW (ref 3.5–5.1)
Sodium: 142 mmol/L (ref 135–145)
TOTAL PROTEIN: 7.6 g/dL (ref 6.5–8.1)
Total Bilirubin: 0.3 mg/dL (ref 0.3–1.2)

## 2017-06-28 LAB — CBC WITH DIFFERENTIAL/PLATELET
BASOS ABS: 0.1 10*3/uL (ref 0.0–0.1)
Basophils Relative: 1 %
EOS ABS: 0.1 10*3/uL (ref 0.0–0.7)
EOS PCT: 1 %
HCT: 43.4 % (ref 39.0–52.0)
HEMOGLOBIN: 14.9 g/dL (ref 13.0–17.0)
LYMPHS ABS: 2 10*3/uL (ref 0.7–4.0)
LYMPHS PCT: 48 %
MCH: 31.3 pg (ref 26.0–34.0)
MCHC: 34.3 g/dL (ref 30.0–36.0)
MCV: 91.2 fL (ref 78.0–100.0)
Monocytes Absolute: 0.6 10*3/uL (ref 0.1–1.0)
Monocytes Relative: 14 %
NEUTROS PCT: 36 %
Neutro Abs: 1.5 10*3/uL — ABNORMAL LOW (ref 1.7–7.7)
PLATELETS: 124 10*3/uL — AB (ref 150–400)
RBC: 4.76 MIL/uL (ref 4.22–5.81)
RDW: 17.5 % — ABNORMAL HIGH (ref 11.5–15.5)
WBC: 4.2 10*3/uL (ref 4.0–10.5)

## 2017-06-28 LAB — ETHANOL: ALCOHOL ETHYL (B): 375 mg/dL — AB (ref <10)

## 2017-06-28 LAB — PHENYTOIN LEVEL, TOTAL

## 2017-06-28 MED ORDER — LORAZEPAM 2 MG/ML IJ SOLN: 1.0000 mg | Freq: Four times a day (QID) | INTRAMUSCULAR | Status: DC | PRN

## 2017-06-28 MED ORDER — FLUOXETINE HCL 20 MG PO CAPS
20.0000 mg | ORAL_CAPSULE | Freq: Every day | ORAL | Status: DC
  Administered 2017-06-28 – 2017-06-29 (×2): 20 mg via ORAL
  Filled 2017-06-28 (×2): qty 1

## 2017-06-28 MED ORDER — LORAZEPAM 1 MG PO TABS
0.0000 mg | ORAL_TABLET | Freq: Four times a day (QID) | ORAL | Status: DC
  Administered 2017-06-28 – 2017-06-29 (×3): 2 mg via ORAL
  Administered 2017-06-29: 1 mg via ORAL
  Filled 2017-06-28 (×5): qty 2

## 2017-06-28 MED ORDER — MIRTAZAPINE 30 MG PO TABS
15.0000 mg | ORAL_TABLET | Freq: Every day | ORAL | Status: DC
  Administered 2017-06-28: 15 mg via ORAL
  Filled 2017-06-28: qty 1

## 2017-06-28 MED ORDER — LORAZEPAM 2 MG/ML IJ SOLN: 0.0000 mg | Freq: Four times a day (QID) | INTRAMUSCULAR | Status: DC

## 2017-06-28 MED ORDER — THIAMINE HCL 100 MG/ML IJ SOLN: 100.0000 mg | Freq: Every day | INTRAMUSCULAR | Status: DC

## 2017-06-28 MED ORDER — LORAZEPAM 1 MG PO TABS: 0.0000 mg | ORAL_TABLET | Freq: Two times a day (BID) | ORAL | Status: DC

## 2017-06-28 MED ORDER — LORAZEPAM 1 MG PO TABS
1.0000 mg | ORAL_TABLET | Freq: Four times a day (QID) | ORAL | Status: DC | PRN
  Administered 2017-06-28 (×2): 1 mg via ORAL
  Administered 2017-06-28: 2 mg via ORAL
  Filled 2017-06-28 (×3): qty 1

## 2017-06-28 MED ORDER — PHENYTOIN SODIUM 50 MG/ML IJ SOLN
1000.0000 mg | Freq: Once | INTRAMUSCULAR | Status: AC
  Administered 2017-06-28: 1000 mg via INTRAVENOUS
  Filled 2017-06-28: qty 20

## 2017-06-28 MED ORDER — LORAZEPAM 2 MG/ML IJ SOLN: 0.0000 mg | Freq: Two times a day (BID) | INTRAMUSCULAR | Status: DC

## 2017-06-28 MED ORDER — PHENYTOIN SODIUM EXTENDED 100 MG PO CAPS
100.0000 mg | ORAL_CAPSULE | Freq: Three times a day (TID) | ORAL | Status: DC
  Administered 2017-06-28 – 2017-06-29 (×5): 100 mg via ORAL
  Filled 2017-06-28 (×5): qty 1

## 2017-06-28 MED ORDER — FOLIC ACID 1 MG PO TABS
1.0000 mg | ORAL_TABLET | Freq: Every day | ORAL | Status: DC
  Filled 2017-06-28: qty 1

## 2017-06-28 MED ORDER — VITAMIN B-1 100 MG PO TABS
100.0000 mg | ORAL_TABLET | Freq: Every day | ORAL | Status: DC
  Administered 2017-06-28 – 2017-06-29 (×2): 100 mg via ORAL
  Filled 2017-06-28 (×3): qty 1

## 2017-06-28 MED ORDER — SULFAMETHOXAZOLE-TRIMETHOPRIM 800-160 MG PO TABS: 1.0000 | ORAL_TABLET | Freq: Once | ORAL | Status: DC

## 2017-06-28 MED ORDER — HYDROXYZINE HCL 25 MG PO TABS
25.0000 mg | ORAL_TABLET | Freq: Four times a day (QID) | ORAL | Status: DC | PRN
  Administered 2017-06-28 (×2): 25 mg via ORAL
  Filled 2017-06-28 (×2): qty 1

## 2017-06-28 MED ORDER — QUETIAPINE FUMARATE 100 MG PO TABS
100.0000 mg | ORAL_TABLET | Freq: Every day | ORAL | Status: DC
  Administered 2017-06-28: 100 mg via ORAL
  Filled 2017-06-28: qty 1

## 2017-06-28 MED ORDER — VITAMIN B-1 100 MG PO TABS
100.0000 mg | ORAL_TABLET | Freq: Every day | ORAL | Status: DC
  Administered 2017-06-28: 100 mg via ORAL
  Filled 2017-06-28 (×2): qty 1

## 2017-06-28 MED ORDER — ADULT MULTIVITAMIN W/MINERALS CH
1.0000 | ORAL_TABLET | Freq: Every day | ORAL | Status: DC
  Administered 2017-06-28 – 2017-06-29 (×2): 1 via ORAL
  Filled 2017-06-28 (×3): qty 1

## 2017-06-28 MED ORDER — GABAPENTIN 300 MG PO CAPS
300.0000 mg | ORAL_CAPSULE | Freq: Three times a day (TID) | ORAL | Status: DC
  Administered 2017-06-28 – 2017-06-29 (×5): 300 mg via ORAL
  Filled 2017-06-28 (×5): qty 1

## 2017-06-28 MED ORDER — FOLIC ACID 1 MG PO TABS
1.0000 mg | ORAL_TABLET | Freq: Every day | ORAL | Status: DC
  Administered 2017-06-28 – 2017-06-29 (×2): 1 mg via ORAL
  Filled 2017-06-28 (×2): qty 1

## 2017-06-28 MED ORDER — LISINOPRIL 10 MG PO TABS
10.0000 mg | ORAL_TABLET | Freq: Every day | ORAL | Status: DC
Start: 2017-06-28 — End: 2017-06-29
  Administered 2017-06-28 – 2017-06-29 (×2): 10 mg via ORAL
  Filled 2017-06-28 (×2): qty 1

## 2017-06-28 MED ORDER — SULFAMETHOXAZOLE-TRIMETHOPRIM 800-160 MG PO TABS
1.0000 | ORAL_TABLET | Freq: Two times a day (BID) | ORAL | Status: DC
  Administered 2017-06-28 – 2017-06-29 (×4): 1 via ORAL
  Filled 2017-06-28 (×4): qty 1
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
## 2017-06-28 NOTE — Discharge Instructions (Signed)
NEEDS BACTRIM RX
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
## 2017-06-28 NOTE — ED Provider Notes (Signed)
12:20 AM  Assumed care from Dr. Penne Lash.  Patient is a 52 year old male with history of alcohol abuse, homelessness, seizures on Dilantin who presents to the emergency department complaining of having a seizure yesterday.  Also is intoxicated today reports suicidal thoughts with plan to walk out front of a train.  Exam reveals left knee cellulitis without signs of septic arthritis.  He will be started on Bactrim.  Labs pending.  Patient will need to be reassessed when clinically sober.  3:15 AM  Pt's alcohol level is 375 AST and ALT mildly elevated consistent with alcohol abuse.  Dilantin level is nondetectable.  Will load with IV Dilantin.  Patient will be reassessed in the morning when clinically sober.  6:15 AM  Pt now more clinically sober and awake.  Resting comfortably.  Still complaining of suicidal thoughts.  Will consult TTS further evaluation.  Patient is here voluntarily.  If discharged, patient will need prescriptions for his Dilantin as well as Bactrim for his left knee cellulitis.  I reviewed all nursing notes, vitals, pertinent previous records, EKGs, lab and urine results, imaging (as available).     Ward, Layla Maw, DO 06/28/17 [phone]

## 2017-06-28 NOTE — BH Assessment (Signed)
[facility] Assessment Progress Note Case was staffed with Lord DNP who recommended patient be observed and monitored for safety. Patient will be seen by psychiatry in the a.m.     

## 2017-06-28 NOTE — ED Notes (Signed)
Patient awake, with severe tremors and sweating at this time. Pt also with complaints of nausea.

## 2017-06-28 NOTE — ED Notes (Signed)
Bed: [facility] Expected date:  Expected time:  Means of arrival:  Comments: Room 6

## 2017-06-28 NOTE — ED Notes (Signed)
Patient arrived to unit and was cooperative with care. Pt denies si/hi or plans to harm himself. No distress noted at this time.

## 2017-06-29 ENCOUNTER — Other Ambulatory Visit: Payer: Self-pay

## 2017-06-29 ENCOUNTER — Inpatient Hospital Stay (HOSPITAL_COMMUNITY)
Admission: AD | Admit: 2017-06-29 | Discharge: 2017-07-03 | DRG: 885 | Disposition: A | Discharge status: Home / Self Care | Payer: Medicaid Other | Source: Intra-hospital | Attending: Psychiatry | Admitting: Psychiatry

## 2017-06-29 ENCOUNTER — Encounter (HOSPITAL_COMMUNITY): Payer: Self-pay

## 2017-06-29 DIAGNOSIS — Z885 Allergy status to narcotic agent status: Secondary | ICD-10-CM | POA: Diagnosis not present

## 2017-06-29 DIAGNOSIS — Z62819 Personal history of unspecified abuse in childhood: Secondary | ICD-10-CM | POA: Diagnosis not present

## 2017-06-29 DIAGNOSIS — Z9119 Patient's noncompliance with other medical treatment and regimen: Secondary | ICD-10-CM | POA: Diagnosis not present

## 2017-06-29 DIAGNOSIS — F10239 Alcohol dependence with withdrawal, unspecified: Secondary | ICD-10-CM | POA: Diagnosis present

## 2017-06-29 DIAGNOSIS — I10 Essential (primary) hypertension: Secondary | ICD-10-CM | POA: Diagnosis present

## 2017-06-29 DIAGNOSIS — F17210 Nicotine dependence, cigarettes, uncomplicated: Secondary | ICD-10-CM | POA: Diagnosis present

## 2017-06-29 DIAGNOSIS — R45851 Suicidal ideations: Secondary | ICD-10-CM | POA: Diagnosis present

## 2017-06-29 DIAGNOSIS — Z590 Homelessness: Secondary | ICD-10-CM

## 2017-06-29 DIAGNOSIS — Z560 Unemployment, unspecified: Secondary | ICD-10-CM | POA: Diagnosis not present

## 2017-06-29 DIAGNOSIS — Z8659 Personal history of other mental and behavioral disorders: Secondary | ICD-10-CM

## 2017-06-29 DIAGNOSIS — J449 Chronic obstructive pulmonary disease, unspecified: Secondary | ICD-10-CM | POA: Diagnosis present

## 2017-06-29 DIAGNOSIS — G40909 Epilepsy, unspecified, not intractable, without status epilepticus: Secondary | ICD-10-CM | POA: Diagnosis present

## 2017-06-29 DIAGNOSIS — F1014 Alcohol abuse with alcohol-induced mood disorder: Secondary | ICD-10-CM

## 2017-06-29 DIAGNOSIS — F10129 Alcohol abuse with intoxication, unspecified: Secondary | ICD-10-CM

## 2017-06-29 DIAGNOSIS — R450 Nervousness: Secondary | ICD-10-CM | POA: Diagnosis not present

## 2017-06-29 DIAGNOSIS — F419 Anxiety disorder, unspecified: Secondary | ICD-10-CM

## 2017-06-29 DIAGNOSIS — F1210 Cannabis abuse, uncomplicated: Secondary | ICD-10-CM | POA: Diagnosis present

## 2017-06-29 DIAGNOSIS — Y907 Blood alcohol level of 200-239 mg/100 ml: Secondary | ICD-10-CM | POA: Diagnosis present

## 2017-06-29 DIAGNOSIS — R440 Auditory hallucinations: Secondary | ICD-10-CM

## 2017-06-29 DIAGNOSIS — Y908 Blood alcohol level of 240 mg/100 ml or more: Secondary | ICD-10-CM | POA: Diagnosis not present

## 2017-06-29 DIAGNOSIS — F332 Major depressive disorder, recurrent severe without psychotic features: Secondary | ICD-10-CM

## 2017-06-29 DIAGNOSIS — R4587 Impulsiveness: Secondary | ICD-10-CM

## 2017-06-29 DIAGNOSIS — G4700 Insomnia, unspecified: Secondary | ICD-10-CM | POA: Diagnosis present

## 2017-06-29 DIAGNOSIS — Z79899 Other long term (current) drug therapy: Secondary | ICD-10-CM | POA: Diagnosis not present

## 2017-06-29 DIAGNOSIS — F1020 Alcohol dependence, uncomplicated: Secondary | ICD-10-CM | POA: Diagnosis not present

## 2017-06-29 DIAGNOSIS — R441 Visual hallucinations: Secondary | ICD-10-CM

## 2017-06-29 DIAGNOSIS — Z91018 Allergy to other foods: Secondary | ICD-10-CM

## 2017-06-29 DIAGNOSIS — F1290 Cannabis use, unspecified, uncomplicated: Secondary | ICD-10-CM | POA: Diagnosis not present

## 2017-06-29 DIAGNOSIS — F10229 Alcohol dependence with intoxication, unspecified: Secondary | ICD-10-CM | POA: Diagnosis not present

## 2017-06-29 MED ORDER — QUETIAPINE FUMARATE 100 MG PO TABS
100.0000 mg | ORAL_TABLET | Freq: Every day | ORAL | Status: DC
  Administered 2017-06-29: 100 mg via ORAL
  Filled 2017-06-29 (×4): qty 1

## 2017-06-29 MED ORDER — PHENYTOIN SODIUM EXTENDED 100 MG PO CAPS
100.0000 mg | ORAL_CAPSULE | Freq: Three times a day (TID) | ORAL | Status: DC
  Administered 2017-06-30 – 2017-07-03 (×10): 100 mg via ORAL
  Filled 2017-06-29 (×10): qty 1
  Filled 2017-06-29: qty 21
  Filled 2017-06-29 (×3): qty 1
  Filled 2017-06-29 (×2): qty 21

## 2017-06-29 MED ORDER — TRAZODONE HCL 50 MG PO TABS
50.0000 mg | ORAL_TABLET | Freq: Every evening | ORAL | Status: DC | PRN
  Administered 2017-06-29: 50 mg via ORAL
  Filled 2017-06-29: qty 1

## 2017-06-29 MED ORDER — ALUM & MAG HYDROXIDE-SIMETH 200-200-20 MG/5ML PO SUSP: 30.0000 mL | ORAL | Status: DC | PRN

## 2017-06-29 MED ORDER — LORAZEPAM 1 MG PO TABS
0.0000 mg | ORAL_TABLET | Freq: Four times a day (QID) | ORAL | Status: AC
  Administered 2017-06-29: 1 mg via ORAL
  Administered 2017-06-30: 2 mg via ORAL
  Filled 2017-06-29: qty 1
  Filled 2017-06-29: qty 2

## 2017-06-29 MED ORDER — LORAZEPAM 1 MG PO TABS: 0.0000 mg | ORAL_TABLET | Freq: Two times a day (BID) | ORAL | Status: DC

## 2017-06-29 MED ORDER — NICOTINE POLACRILEX 2 MG MT GUM
CHEWING_GUM | OROMUCOSAL | Status: AC
  Administered 2017-06-29: 19:00:00
  Filled 2017-06-29: qty 1

## 2017-06-29 MED ORDER — NICOTINE POLACRILEX 2 MG MT GUM
2.0000 mg | CHEWING_GUM | OROMUCOSAL | Status: DC | PRN
  Administered 2017-06-29 – 2017-07-03 (×5): 2 mg via ORAL
  Filled 2017-06-29 (×2): qty 1

## 2017-06-29 MED ORDER — SULFAMETHOXAZOLE-TRIMETHOPRIM 800-160 MG PO TABS
1.0000 | ORAL_TABLET | Freq: Two times a day (BID) | ORAL | Status: DC
  Administered 2017-06-29 – 2017-07-03 (×8): 1 via ORAL
  Filled 2017-06-29 (×11): qty 1

## 2017-06-29 MED ORDER — LORAZEPAM 2 MG/ML IJ SOLN: 0.0000 mg | Freq: Two times a day (BID) | INTRAMUSCULAR | Status: DC

## 2017-06-29 MED ORDER — LISINOPRIL 10 MG PO TABS
10.0000 mg | ORAL_TABLET | Freq: Every day | ORAL | Status: DC
  Administered 2017-06-30 – 2017-07-03 (×4): 10 mg via ORAL
  Filled 2017-06-29: qty 1
  Filled 2017-06-29 (×2): qty 2
  Filled 2017-06-29 (×2): qty 1
  Filled 2017-06-29: qty 7
  Filled 2017-06-29 (×2): qty 1

## 2017-06-29 MED ORDER — VITAMIN B-1 100 MG PO TABS
100.0000 mg | ORAL_TABLET | Freq: Every day | ORAL | Status: DC
  Administered 2017-06-30: 100 mg via ORAL
  Filled 2017-06-29 (×3): qty 1

## 2017-06-29 MED ORDER — HYDROXYZINE HCL 25 MG PO TABS
25.0000 mg | ORAL_TABLET | Freq: Three times a day (TID) | ORAL | Status: DC | PRN
  Administered 2017-06-29: 25 mg via ORAL

## 2017-06-29 MED ORDER — HYDROXYZINE HCL 25 MG PO TABS: 25.0000 mg | ORAL_TABLET | Freq: Four times a day (QID) | ORAL | Status: DC | PRN

## 2017-06-29 MED ORDER — LORAZEPAM 1 MG PO TABS
1.0000 mg | ORAL_TABLET | Freq: Four times a day (QID) | ORAL | Status: DC | PRN
  Administered 2017-06-29 – 2017-06-30 (×3): 1 mg via ORAL
  Filled 2017-06-29 (×3): qty 1

## 2017-06-29 MED ORDER — LORAZEPAM 2 MG/ML IJ SOLN: 1.0000 mg | Freq: Four times a day (QID) | INTRAMUSCULAR | Status: DC | PRN

## 2017-06-29 MED ORDER — GABAPENTIN 300 MG PO CAPS
300.0000 mg | ORAL_CAPSULE | Freq: Three times a day (TID) | ORAL | Status: DC
  Administered 2017-06-30 (×2): 300 mg via ORAL
  Filled 2017-06-29 (×7): qty 1

## 2017-06-29 MED ORDER — FLUOXETINE HCL 20 MG PO CAPS
20.0000 mg | ORAL_CAPSULE | Freq: Every day | ORAL | Status: DC
  Administered 2017-06-30 – 2017-07-03 (×4): 20 mg via ORAL
  Filled 2017-06-29: qty 1
  Filled 2017-06-29: qty 7
  Filled 2017-06-29 (×5): qty 1

## 2017-06-29 MED ORDER — LORAZEPAM 2 MG/ML IJ SOLN: 0.0000 mg | Freq: Four times a day (QID) | INTRAMUSCULAR | Status: AC

## 2017-06-29 MED ORDER — MAGNESIUM HYDROXIDE 400 MG/5ML PO SUSP: 30.0000 mL | Freq: Every day | ORAL | Status: DC | PRN

## 2017-06-29 MED ORDER — ADULT MULTIVITAMIN W/MINERALS CH
1.0000 | ORAL_TABLET | Freq: Every day | ORAL | Status: DC
  Administered 2017-06-30: 1 via ORAL
  Filled 2017-06-29 (×3): qty 1

## 2017-06-29 MED ORDER — FOLIC ACID 1 MG PO TABS
1.0000 mg | ORAL_TABLET | Freq: Every day | ORAL | Status: DC
  Administered 2017-06-30 – 2017-07-03 (×4): 1 mg via ORAL
  Filled 2017-06-29 (×7): qty 1

## 2017-06-29 MED ORDER — MIRTAZAPINE 15 MG PO TABS
15.0000 mg | ORAL_TABLET | Freq: Every day | ORAL | Status: DC
  Administered 2017-06-29: 15 mg via ORAL
  Filled 2017-06-29 (×4): qty 1

## 2017-06-29 MED ORDER — THIAMINE HCL 100 MG/ML IJ SOLN: 100.0000 mg | Freq: Every day | INTRAMUSCULAR | Status: DC

## 2017-06-29 NOTE — Consult Note (Addendum)
[place] Psychiatry Consult   Reason for Consult:  Suicidal ideation Referring Physician:  EDP Patient Identification: Rodney Watson MRN:  [id number] Principal Diagnosis: Alcohol abuse with alcohol-induced mood disorder [facility]) Diagnosis:   Patient Active Problem List   Diagnosis Date Noted  . MDD (major depressive disorder), recurrent episode, severe ([place]) [F33.2] 04/13/2017  . Alcohol abuse with alcohol-induced mood disorder ([place]) [F10.14] 04/10/2017  . Alcohol abuse [F10.10] 04/01/2017    Total Time spent with patient: 45 minutes  Subjective:   Rodney Watson is a 52 y.o. male patient admitted with suicidal ideation and intoxication.  HPI:  Pt was seen and chart reviewed with treatment team and Dr Darleene Cleaver. Pt denies homicidal ideation, endorses suicidal ideation, endorses auditory/visual hallucinations and does not appear to be responding to internal stimuli. Pt's UDS negative, BAL 375. Pt has a history of  Alcoholism and is currently experiencing tremors, nausea, and agitation. Pt endorses depression along with his substance abuse. Pt would benefit from an inpatient psychiatric admission for crisis stabilization and medication management.   Past Psychiatric History: As above  Risk to Self: Suicidal Ideation: Yes-Currently Present Suicidal Intent: Yes-Currently Present Is patient at risk for suicide?: Yes Suicidal Plan?: Yes-Currently Present Specify Current Suicidal Plan: Pt reports jump in front of train Access to Means: Yes Specify Access to Suicidal Means: Pt states he resides besides train tracks What has been your use of drugs/alcohol within the last 12 months?: Ongoing How many times?: 1 Other Self Harm Risks: (Excessive SA use) Triggers for Past Attempts: Other (Comment)(Excessive SA use) Intentional Self Injurious Behavior: None Comment - Self Injurious Behavior: Pt denies Risk to Others: Homicidal Ideation: No Thoughts of Harm to Others: No Current Homicidal  Intent: No Current Homicidal Plan: No Access to Homicidal Means: No Identified Victim: NA History of harm to others?: No Assessment of Violence: None Noted Violent Behavior Description: NA Does patient have access to weapons?: No Criminal Charges Pending?: No Does patient have a court date: No Prior Inpatient Therapy: Prior Inpatient Therapy: Yes Prior Therapy Dates: 2019,2018 Prior Therapy Facilty/Provider(s): [facility] Reason for Treatment: SA use Prior Outpatient Therapy: Prior Outpatient Therapy: No Prior Therapy Dates: Denies current provider Prior Therapy Facilty/Provider(s): [facility] pt denies Reason for Treatment: Assist with housing in past  Does patient have an ACCT team?: No Does patient have Intensive In-House Services?  : No Does patient have [facility] services? : No Does patient have P4CC services?: No  Past Medical History:  Past Medical History:  Diagnosis Date  . Alcohol abuse   . COPD (chronic obstructive pulmonary disease) ([place])   . Depression   . ETOH abuse   . Seizures ([place])    History reviewed. No pertinent surgical history. Family History: History reviewed. No pertinent family history. Family Psychiatric  History: Unknown Social History:  Social History   Substance and Sexual Activity  Alcohol Use Yes   Comment: 5th daily     Social History   Substance and Sexual Activity  Drug Use Yes  . Types: Marijuana   Comment: 1-2 joints/week    Social History   Socioeconomic History  . Marital status: Single    Spouse name: Not on file  . Number of children: Not on file  . Years of education: Not on file  . Highest education level: Not on file  Occupational History  . Not on file  Social Needs  . Financial resource strain: Not on file  . Food insecurity:    Worry: Not  on file    Inability: Not on file  . Transportation needs:    Medical: Not on file    Non-medical: Not on file  Tobacco Use  . Smoking status: Current Every Day Smoker     Packs/day: 1.50    Years: 39.00    Pack years: 58.50    Types: Cigarettes  . Smokeless tobacco: Current User    Types: Chew  Substance and Sexual Activity  . Alcohol use: Yes    Comment: 5th daily  . Drug use: Yes    Types: Marijuana    Comment: 1-2 joints/week  . Sexual activity: Never  Lifestyle  . Physical activity:    Days per week: Not on file    Minutes per session: Not on file  . Stress: Not on file  Relationships  . Social connections:    Talks on phone: Not on file    Gets together: Not on file    Attends religious service: Not on file    Active member of club or organization: Not on file    Attends meetings of clubs or organizations: Not on file    Relationship status: Not on file  Other Topics Concern  . Not on file  Social History Narrative   ** Merged History Encounter **       Additional Social History:    Allergies:   Allergies  Allergen Reactions  . Chocolate Hives  . Vicodin [Hydrocodone-Acetaminophen] Nausea And Vomiting    Labs:  Results for orders placed or performed during the hospital encounter of 06/27/17 (from the past 48 hour(s))  Urine rapid drug screen (hosp performed)     Status: None   Collection Time: 06/27/17 11:27 PM  Result Value Ref Range   Opiates NONE DETECTED NONE DETECTED   Cocaine NONE DETECTED NONE DETECTED   Benzodiazepines NONE DETECTED NONE DETECTED   Amphetamines NONE DETECTED NONE DETECTED   Tetrahydrocannabinol NONE DETECTED NONE DETECTED   Barbiturates NONE DETECTED NONE DETECTED    Comment: (NOTE) DRUG SCREEN FOR MEDICAL PURPOSES ONLY.  IF CONFIRMATION IS NEEDED FOR ANY PURPOSE, NOTIFY LAB WITHIN 5 DAYS. LOWEST DETECTABLE LIMITS FOR URINE DRUG SCREEN Drug Class                     Cutoff (ng/mL) Amphetamine and metabolites    1000 Barbiturate and metabolites    200 Benzodiazepine                 010 Tricyclics and metabolites     300 Opiates and metabolites        300 Cocaine and metabolites         300 THC                            50 Performed at [facility], [place] [address], [place], [place] [address]   Comprehensive metabolic panel     Status: Abnormal   Collection Time: 06/28/17 12:09 AM  Result Value Ref Range   Sodium 142 135 - 145 mmol/L   Potassium 3.4 (L) 3.5 - 5.1 mmol/L   Chloride 107 101 - 111 mmol/L   CO2 19 (L) 22 - 32 mmol/L   Glucose, Bld 88 65 - 99 mg/dL   BUN 9 6 - 20 mg/dL   Creatinine, Ser 0.70 0.61 - 1.24 mg/dL   Calcium 8.2 (L) 8.9 - 10.3 mg/dL   Total Protein 7.6 6.5 -  8.1 g/dL   Albumin 4.3 3.5 - 5.0 g/dL   AST 85 (H) 15 - 41 U/L   ALT 64 (H) 17 - 63 U/L   Alkaline Phosphatase 78 38 - 126 U/L   Total Bilirubin 0.3 0.3 - 1.2 mg/dL   GFR calc non Af Amer >60 >60 mL/min   GFR calc Af Amer >60 >60 mL/min    Comment: (NOTE) The eGFR has been calculated using the CKD EPI equation. This calculation has not been validated in all clinical situations. eGFR's persistently <60 mL/min signify possible Chronic Kidney Disease.    Anion gap 16 (H) 5 - 15    Comment: Performed at [facility], [place] [address] [place], [place] [address]  Ethanol     Status: Abnormal   Collection Time: 06/28/17 12:09 AM  Result Value Ref Range   Alcohol, Ethyl (B) 375 (HH) <10 mg/dL    Comment:        LOWEST DETECTABLE LIMIT FOR SERUM ALCOHOL IS 10 mg/dL FOR MEDICAL PURPOSES ONLY CRITICAL RESULT CALLED TO, READ BACK BY AND VERIFIED WITH: J OXENDINE,RN _0  06/28/17 MKELLY Performed at [facility], [place] [address], [place], [place] [address]   CBC with Diff     Status: Abnormal   Collection Time: 06/28/17 12:09 AM  Result Value Ref Range   WBC 4.2 4.0 - 10.5 K/uL   RBC 4.76 4.22 - 5.81 MIL/uL   Hemoglobin 14.9 13.0 - 17.0 g/dL   HCT 43.4 39.0 - 52.0 %   MCV 91.2 78.0 - 100.0 fL   MCH 31.3 26.0 - 34.0 pg   MCHC 34.3 30.0 - 36.0 g/dL   RDW 17.5 (H) 11.5 - 15.5 %   Platelets 124 (L) 150 - 400 K/uL   Neutrophils  Relative % 36 %   Neutro Abs 1.5 (L) 1.7 - 7.7 K/uL   Lymphocytes Relative 48 %   Lymphs Abs 2.0 0.7 - 4.0 K/uL   Monocytes Relative 14 %   Monocytes Absolute 0.6 0.1 - 1.0 K/uL   Eosinophils Relative 1 %   Eosinophils Absolute 0.1 0.0 - 0.7 K/uL   Basophils Relative 1 %   Basophils Absolute 0.1 0.0 - 0.1 K/uL    Comment: Performed at [facility], [place] [address] [place], [place] [address]  Phenytoin level, total     Status: Abnormal   Collection Time: 06/28/17 12:09 AM  Result Value Ref Range   Phenytoin Lvl <2.5 (L) 10.0 - 20.0 ug/mL    Comment: Performed at [facility], [place] [address] [place], [place] [address]    Current Facility-Administered Medications  Medication Dose Route Frequency Provider Last Rate Last Dose  . FLUoxetine (PROZAC) capsule 20 mg  20 mg Oral Daily Ward, Kristen N, DO   20 mg at 06/29/17 1047  . folic acid (FOLVITE) tablet 1 mg  1 mg Oral Daily Ward, Kristen N, DO   1 mg at 06/29/17 1048  . gabapentin (NEURONTIN) capsule 300 mg  300 mg Oral TID Ward, Kristen N, DO   300 mg at 06/29/17 1047  . hydrOXYzine (ATARAX/VISTARIL) tablet 25 mg  25 mg Oral Q6H PRN Ward, Kristen N, DO   25 mg at 06/28/17 2325  . lisinopril (PRINIVIL,ZESTRIL) tablet 10 mg  10 mg Oral Daily Ward, Kristen N, DO   10 mg at 06/29/17 1048  . LORazepam (ATIVAN) injection 0-4 mg  0-4 mg Intravenous Q6H Ward, Kristen N, DO   Stopped  at 06/28/17 0727   Or  . LORazepam (ATIVAN) tablet 0-4 mg  0-4 mg Oral Q6H Ward, Kristen N, DO   1 mg at 06/29/17 1306  . [START ON 06/30/2017] LORazepam (ATIVAN) injection 0-4 mg  0-4 mg Intravenous Q12H Ward, Kristen N, DO       Or  . [START ON 06/30/2017] LORazepam (ATIVAN) tablet 0-4 mg  0-4 mg Oral Q12H Ward, Kristen N, DO      . LORazepam (ATIVAN) tablet 1 mg  1 mg Oral Q6H PRN Ward, Kristen N, DO   2 mg at 06/28/17 2325   Or  . LORazepam (ATIVAN) injection 1 mg  1 mg Intravenous Q6H PRN Ward, Kristen N, DO      .  mirtazapine (REMERON) tablet 15 mg  15 mg Oral QHS Ward, Kristen N, DO   15 mg at 06/28/17 2137  . multivitamin with minerals tablet 1 tablet  1 tablet Oral Daily Ward, Kristen N, DO   1 tablet at 06/29/17 1047  . phenytoin (DILANTIN) ER capsule 100 mg  100 mg Oral TID Ward, Kristen N, DO   100 mg at 06/29/17 1048  . QUEtiapine (SEROQUEL) tablet 100 mg  100 mg Oral QHS Ward, Kristen N, DO   100 mg at 06/28/17 2136  . sulfamethoxazole-trimethoprim (BACTRIM DS,SEPTRA DS) 800-160 MG per tablet 1 tablet  1 tablet Oral Q12H Duffy Bruce, MD   1 tablet at 06/29/17 1048  . thiamine (VITAMIN B-1) tablet 100 mg  100 mg Oral Daily Ward, Kristen N, DO   100 mg at 06/28/17 1000   Or  . thiamine (B-1) injection 100 mg  100 mg Intravenous Daily Ward, Kristen N, DO       Current Outpatient Medications  Medication Sig Dispense Refill  . phenytoin (DILANTIN) 100 MG ER capsule Take 1 capsule (100 mg total) by mouth 3 (three) times daily. 90 capsule 0  . FLUoxetine (PROZAC) 20 MG capsule Take 1 capsule (20 mg total) by mouth daily. (Patient not taking: Reported on 06/27/2017) 30 capsule 0  . gabapentin (NEURONTIN) 300 MG capsule Take 1 capsule (300 mg total) by mouth 3 (three) times daily. (Patient not taking: Reported on 06/27/2017) 90 capsule 0  . hydrOXYzine (ATARAX/VISTARIL) 25 MG tablet Take 1 tablet (25 mg total) by mouth every 6 (six) hours as needed for anxiety. (Patient not taking: Reported on 06/27/2017) 30 tablet 0  . lisinopril (PRINIVIL,ZESTRIL) 10 MG tablet Take 1 tablet (10 mg total) by mouth daily. For high blood pressure (Patient not taking: Reported on 06/27/2017) 30 tablet 0  . mirtazapine (REMERON) 15 MG tablet Take 1 tablet (15 mg total) by mouth at bedtime. (Patient not taking: Reported on 06/27/2017) 30 tablet 0  . QUEtiapine (SEROQUEL) 100 MG tablet Take 1 tablet (100 mg total) by mouth at bedtime. (Patient not taking: Reported on 06/27/2017) 30 tablet 0    Musculoskeletal: Strength & Muscle Tone:  within normal limits Gait & Station: normal Patient leans: N/A  Psychiatric Specialty Exam: Physical Exam  Constitutional: He is oriented to person, place, and time. He appears well-developed and well-nourished.  HENT:  Head: Normocephalic.  Respiratory: Effort normal.  Musculoskeletal: Normal range of motion.  Neurological: He is alert and oriented to person, place, and time.  Psychiatric: His speech is normal and behavior is normal. Thought content normal. His mood appears anxious. Cognition and memory are normal. He expresses impulsivity. He exhibits a depressed mood.    Review of Systems  Psychiatric/Behavioral: Positive  for depression, substance abuse and suicidal ideas. Negative for hallucinations and memory loss. The patient is nervous/anxious. The patient does not have insomnia.   All other systems reviewed and are negative.   Blood pressure 128/86, pulse 95, temperature 98.7 F (37.1 C), temperature source Oral, resp. rate 16, height _0  (1.727 m), weight 134 lb (60.8 kg), SpO2 95 %.Body mass index is 20.37 kg/m.  General Appearance: Disheveled  Eye Contact:  Good  Speech:  Clear and Coherent  Volume:  Normal  Mood:  Anxious and Depressed  Affect:  Congruent and Depressed  Thought Process:  Coherent  Orientation:  Full (Time, Place, and Person)  Thought Content:  Logical  Suicidal Thoughts:  Yes.  with intent/plan  Homicidal Thoughts:  No  Memory:  Immediate;   Good Recent;   Fair Remote;   Fair  Judgement:  Impaired  Insight:  Lacking  Psychomotor Activity:  Normal  Concentration:  Concentration: Good and Attention Span: Good  Recall:  Good  Fund of Knowledge:  Good  Language:  Good  Akathisia:  No  Handed:  Right  AIMS (if indicated):     Assets:  Communication Skills Desire for Improvement Social Support  ADL's:  Intact  Cognition:  WNL  Sleep:        Treatment Plan Summary: Daily contact with patient to assess and evaluate symptoms and progress in  treatment and Medication management ( see MAR )  Disposition: Recommend psychiatric Inpatient admission when medically cleared.  Ethelene Hal, NP 06/29/2017 1:26 PM  Patient seen face-to-face for psychiatric evaluation, chart reviewed and case discussed with the physician extender and developed treatment plan. Reviewed the information documented and agree with the treatment plan. Corena Pilgrim, MD

## 2017-06-29 NOTE — BH Assessment (Signed)
BHH Assessment Progress Note Per AC, patient has been accepted to [facility] [id number] at 1500 hours.

## 2017-06-29 NOTE — ED Notes (Signed)
Report called to [facility].  [facility] called for transport.

## 2017-06-29 NOTE — ED Notes (Signed)
Pt is cooperative with visible tremors in his arms when awake. He wants help with his drinking which is causing him to feel suicidal.

## 2017-06-29 NOTE — Progress Notes (Signed)
D.  Pt pleasant on approach, some confusion noted.  Pt complaint of anxiety.  Pt was positive for evening wrap up group, observed engaged in appropriate interaction with peers on the unit.  Pt denies SI/HI/AVH at this time.  Pt did take shower on unit.  Pt stated he wishes we had a barber shop in house.  A.  Support and encouragement offered, medication given as ordered.  Pt is a high fall risk.  R.  Pt remains safe on the unit, will continue to monitor.

## 2017-06-29 NOTE — Progress Notes (Signed)
Patient is a 52 year old male admitted from [facility] for SI with a plan to step in front of a train. Pt reports that he is now seeking alcohol detoxification. Pt is currently homeless and has a hx of of MDD, seizures, COPD and multiple admissions for alcohol abuse. Pt was calm and cooperative during admission interview- answering all questions appropriately. Pt currently denies SI/HI and A/V hallucinations. Admission paperwork completed and signed. Belongings searched and secured in locker. Skin assessment completed with no contraband found- Old scars from "bug bites" noted on both lower legs. Pt provided a pitcher of gatorade and oriented to unit. Q 15 min checks and fall risk precautions initiated. VSS

## 2017-06-29 NOTE — Progress Notes (Signed)
B[facility] Group Notes:  (Nursing/MHT/Case Management/Adjunct)  Date:  06/29/2017  Time:  2045  Type of Therapy:  wrap up group  Participation Level:  Active  Participation Quality:  Appropriate, Attentive, Sharing and Supportive  Affect:  Appropriate  Cognitive:  Alert and Appropriate  Insight:  Improving  Engagement in Group:  Engaged  Modes of Intervention:  Clarification, Education and Socialization  Summary of Progress/Problems:  Rodney Watson 06/29/2017, 9:36 PM

## 2017-06-29 NOTE — Tx Team (Signed)
Initial Treatment Plan 06/29/2017 7:52 PM [name] [id number]    PATIENT STRESSORS: Financial difficulties Loss of homeless Substance abuse   PATIENT STRENGTHS: Active sense of humor Communication skills Motivation for treatment/growth   PATIENT IDENTIFIED PROBLEMS:     alcoholism    No support system    homeless         DISCHARGE CRITERIA:  Adequate post-discharge living arrangements Improved stabilization in mood, thinking, and/or behavior Motivation to continue treatment in a less acute level of care Reduction of life-threatening or endangering symptoms to within safe limits  PRELIMINARY DISCHARGE PLAN: Placement in alternative living arrangements  PATIENT/FAMILY INVOLVEMENT: This treatment plan has been presented to and reviewed with the patient, Rodney Watson. The patient has been given the opportunity to ask questions and make suggestions.  [name], RN 06/29/2017, 7:52 PM

## 2017-06-30 DIAGNOSIS — F10229 Alcohol dependence with intoxication, unspecified: Secondary | ICD-10-CM

## 2017-06-30 DIAGNOSIS — F1210 Cannabis abuse, uncomplicated: Secondary | ICD-10-CM

## 2017-06-30 DIAGNOSIS — Z62819 Personal history of unspecified abuse in childhood: Secondary | ICD-10-CM

## 2017-06-30 DIAGNOSIS — Z818 Family history of other mental and behavioral disorders: Secondary | ICD-10-CM

## 2017-06-30 DIAGNOSIS — G40909 Epilepsy, unspecified, not intractable, without status epilepticus: Secondary | ICD-10-CM

## 2017-06-30 DIAGNOSIS — G4700 Insomnia, unspecified: Secondary | ICD-10-CM

## 2017-06-30 DIAGNOSIS — R450 Nervousness: Secondary | ICD-10-CM

## 2017-06-30 DIAGNOSIS — F332 Major depressive disorder, recurrent severe without psychotic features: Principal | ICD-10-CM

## 2017-06-30 DIAGNOSIS — Z590 Homelessness: Secondary | ICD-10-CM

## 2017-06-30 DIAGNOSIS — Y907 Blood alcohol level of 200-239 mg/100 ml: Secondary | ICD-10-CM

## 2017-06-30 DIAGNOSIS — I10 Essential (primary) hypertension: Secondary | ICD-10-CM

## 2017-06-30 DIAGNOSIS — R45851 Suicidal ideations: Secondary | ICD-10-CM

## 2017-06-30 DIAGNOSIS — F419 Anxiety disorder, unspecified: Secondary | ICD-10-CM

## 2017-06-30 MED ORDER — HALOPERIDOL 5 MG PO TABS
ORAL_TABLET | ORAL | Status: AC
  Filled 2017-06-30: qty 1

## 2017-06-30 MED ORDER — ADULT MULTIVITAMIN W/MINERALS CH
1.0000 | ORAL_TABLET | Freq: Every day | ORAL | Status: DC
  Administered 2017-07-01 – 2017-07-03 (×3): 1 via ORAL
  Filled 2017-06-30 (×7): qty 1

## 2017-06-30 MED ORDER — VITAMIN B-1 100 MG PO TABS
100.0000 mg | ORAL_TABLET | Freq: Every day | ORAL | Status: DC
  Administered 2017-07-01 – 2017-07-03 (×3): 100 mg via ORAL
  Filled 2017-06-30 (×5): qty 1

## 2017-06-30 MED ORDER — LORAZEPAM 1 MG PO TABS
1.0000 mg | ORAL_TABLET | Freq: Two times a day (BID) | ORAL | Status: AC
  Administered 2017-07-02 – 2017-07-03 (×2): 1 mg via ORAL
  Filled 2017-06-30 (×2): qty 1

## 2017-06-30 MED ORDER — LOPERAMIDE HCL 2 MG PO CAPS: 2.0000 mg | ORAL_CAPSULE | ORAL | Status: DC | PRN

## 2017-06-30 MED ORDER — MIRTAZAPINE 7.5 MG PO TABS: 7.5000 mg | ORAL_TABLET | Freq: Every day | ORAL | Status: DC

## 2017-06-30 MED ORDER — THIAMINE HCL 100 MG/ML IJ SOLN
100.0000 mg | Freq: Once | INTRAMUSCULAR | Status: DC
Start: 2017-06-30 — End: 2017-06-30

## 2017-06-30 MED ORDER — HALOPERIDOL 5 MG PO TABS
5.0000 mg | ORAL_TABLET | Freq: Once | ORAL | Status: AC
  Administered 2017-06-30: 5 mg via ORAL
  Filled 2017-06-30: qty 1

## 2017-06-30 MED ORDER — ONDANSETRON 4 MG PO TBDP: 4.0000 mg | ORAL_TABLET | Freq: Four times a day (QID) | ORAL | Status: DC | PRN

## 2017-06-30 MED ORDER — TRAZODONE HCL 100 MG PO TABS
100.0000 mg | ORAL_TABLET | Freq: Every evening | ORAL | Status: DC | PRN
  Administered 2017-06-30 – 2017-07-02 (×4): 100 mg via ORAL
  Filled 2017-06-30: qty 1
  Filled 2017-06-30: qty 14
  Filled 2017-06-30 (×2): qty 1
  Filled 2017-06-30: qty 14
  Filled 2017-06-30 (×7): qty 1

## 2017-06-30 MED ORDER — LORAZEPAM 1 MG PO TABS
1.0000 mg | ORAL_TABLET | Freq: Four times a day (QID) | ORAL | Status: DC | PRN
  Administered 2017-06-30 – 2017-07-02 (×3): 1 mg via ORAL
  Filled 2017-06-30 (×2): qty 1

## 2017-06-30 MED ORDER — LORAZEPAM 1 MG PO TABS
1.0000 mg | ORAL_TABLET | Freq: Three times a day (TID) | ORAL | Status: AC
Start: 2017-07-01 — End: 2017-07-02
  Administered 2017-07-01 – 2017-07-02 (×3): 1 mg via ORAL
  Filled 2017-06-30 (×3): qty 1

## 2017-06-30 MED ORDER — GABAPENTIN 100 MG PO CAPS
100.0000 mg | ORAL_CAPSULE | Freq: Three times a day (TID) | ORAL | Status: DC
  Administered 2017-06-30 – 2017-07-01 (×2): 100 mg via ORAL
  Filled 2017-06-30 (×6): qty 1

## 2017-06-30 MED ORDER — HYDROXYZINE HCL 25 MG PO TABS
25.0000 mg | ORAL_TABLET | Freq: Four times a day (QID) | ORAL | Status: DC | PRN
  Administered 2017-06-30 – 2017-07-03 (×4): 25 mg via ORAL
  Filled 2017-06-30 (×4): qty 1

## 2017-06-30 MED ORDER — LORAZEPAM 1 MG PO TABS
1.0000 mg | ORAL_TABLET | Freq: Four times a day (QID) | ORAL | Status: AC
  Administered 2017-06-30 – 2017-07-01 (×4): 1 mg via ORAL
  Filled 2017-06-30 (×5): qty 1

## 2017-06-30 MED ORDER — LORAZEPAM 1 MG PO TABS: 1.0000 mg | ORAL_TABLET | Freq: Every day | ORAL | Status: DC

## 2017-06-30 NOTE — BHH Group Notes (Signed)
B[facility] Group Notes: (Clinical Social Work)   06/30/2017      Type of Therapy:  Group Therapy   Participation Level:  Did Not Attend despite MHT prompting   Tarus Briski Grossman-Orr, LCSW 06/30/2017, 12:56 PM     

## 2017-06-30 NOTE — Progress Notes (Signed)
Pt has been up most of night, remains confused.  Moved to a different room without a roommate due to yelling out during night, pacing in room.  Pt able to be re-oriented but quickly forgets and becomes confused again.  Will continue to monitor for safety.

## 2017-06-30 NOTE — Progress Notes (Signed)
Pt did not attend either goals or orientation group. Pt has been sleep all day.

## 2017-06-30 NOTE — Progress Notes (Signed)
Pt up and confused, wandering in hall, appears to believe he is at a hotel.  Pt removed his armband and appeared confused when asked about it.  Pt has been increasingly confused this evening.  Scheduled ativan given and P.A. notified and new order received.  Pt took medication without issue.  Will continue to monitor.

## 2017-06-30 NOTE — BHH Counselor (Addendum)
CSW attempted to meet with patient to complete PSA. However, nursing staff informed CSW that Rodney Watson was very sick and had finally went to sleep. CSW will leave a note for social worker to attempt to meet with patient on Monday.   Shellia Cleverly, [place]  06/30/2017 3:05 PM

## 2017-06-30 NOTE — BHH Suicide Risk Assessment (Addendum)
[facility] Admission Suicide Risk Assessment   Nursing information obtained from:   patient and chart  Demographic factors:   52 year old single male, no children, homeless  Current Mental Status:   see below  Loss Factors:   homelessness, limited support network Historical Factors:   Depression, alcohol use disorder  Risk Reduction Factors:   resilience  Total Time spent with patient: 45 minutes Principal Problem: MDD (major depressive disorder), recurrent severe, without psychosis (H[facility]) Diagnosis:   Patient Active Problem List   Diagnosis Date Noted  . MDD (major depressive disorder), recurrent severe, without psychosis (HCC) [F33.2] 06/29/2017  . MDD (major depressive disorder), recurrent episode, severe ([facility]) [F33.2] 04/13/2017  . Alcohol abuse with alcohol-induced mood disorder (HCC) [F10.14] 04/10/2017  . Alcohol abuse [F10.10] 04/01/2017    Continued Clinical Symptoms:    The "Alcohol Use Disorders Identification Test", Guidelines for Use in Primary Care, Second Edition.  World Science writer [facility]). Score between 0-7:  no or low risk or alcohol related problems. Score between 8-15:  moderate risk of alcohol related problems. Score between 16-19:  high risk of alcohol related problems. Score 20 or above:  warrants further diagnostic evaluation for alcohol dependence and treatment.   CLINICAL FACTORS:  52 year old male , single, homeless. Presented to hospital due to depression, suicidal ideations of stepping into an oncoming train, alcohol dependence. Reports sadness and neuro-vegetative symptoms- anhedonia, decreased energy level.  States " I am tired of drinking, I am tired of how I am living , I need to take a rest from alcohol". Reports daily alcohol consumption- usually around 160 ounces of beer per day. Also endorses cannabis abuse. Denies other drug abuse.  Has not been taking his prescribed medications recently  Patient endorses long history of alcohol dependence and  history of prior psychiatric admissions. He was last admitted 05/16/17 for alcohol dependence and depression- was discharged on Remeron, Prozac, Neurontin, Dilantin.   Medical History is remarkable for seizures, COPD.  Currently patient presents with symptoms of alcohol WDL- distal tremors, facial flushing, but vitals are stable, and denies visual disturbances or headache.  Dx- Alcohol Dependence- Alcohol Induced Mood Disorder Depressed versus MDD  Plan- Inpatient admission Ativan detox protocol. ( History of elevated LFTs)  Restarted on Dilantin at 100 mgrs TID Restarted on Prozac 20 mgrs QDAY, Neurontin 100 mgrs TID  At this time will not restart Seroquel as it may lower seizure threshold in the context of ETOH WDL. Will also not restart Remeron yet to minimize sedation/drug - drug interactions Patient states he is interested in being referred to a rehab setting at discharge.     Musculoskeletal: Strength & Muscle Tone: (+) distal tremors, no gross restlessness or agitation Gait & Station: normal Patient leans: N/A  Psychiatric Specialty Exam: Physical Exam  ROS at this time denies headache, denies visual disturbances, no chest pain, no shortness of breath  Blood pressure 107/79, pulse 72, temperature 99.6 F (37.6 C), temperature source Oral, resp. rate 18, height  (1.676 m), weight 59 kg (130 lb), SpO2 99 %.Body mass index is 20.98 kg/m.  General Appearance: Fairly Groomed  Eye Contact:  Good  Speech:  Normal Rate, stutters at times   Volume:  Normal  Mood:  depressed  Affect:  constricted, vaguely anxious   Thought Process:  Linear and Descriptions of Associations: Intact  Orientation:  Other:  fully alert , attentive, oriented x 3  Thought Content:  no hallucinations, no delusions, not internally preoccupied  Suicidal Thoughts:  No denies suicidal or self injurious ideations at this time and contracts for safety on unit, denies homicidal or violent ideations   Homicidal Thoughts:  No  Memory:  recent and remote grossly intact   Judgement:  Fair  Insight:  Fair  Psychomotor Activity:  (+) distal tremors but no current psychomotor agitation   Concentration:  Concentration: Good and Attention Span: Good  Recall:  Good  Fund of Knowledge:  Good  Language:  Good  Akathisia:  Negative  Handed:  Right  AIMS (if indicated):     Assets:  Desire for Improvement Resilience  ADL's:  Intact  Cognition:  WNL  Sleep:  Number of Hours: 2      COGNITIVE FEATURES THAT CONTRIBUTE TO RISK:  Closed-mindedness and Loss of executive function    SUICIDE RISK:   Moderate:  Frequent suicidal ideation with limited intensity, and duration, some specificity in terms of plans, no associated intent, good self-control, limited dysphoria/symptomatology, some risk factors present, and identifiable protective factors, including available and accessible social support.  PLAN OF CARE: Patient will be admitted to inpatient psychiatric unit for stabilization and safety. Will provide and encourage milieu participation. Provide medication management and maked adjustments as needed. Will also provide medication management to address symptoms of WDL.  Will follow daily.    I certify that inpatient services furnished can reasonably be expected to improve the patient's condition.   Craige Cotta, MD 06/30/2017, 4:42 PM

## 2017-06-30 NOTE — Progress Notes (Signed)
D. Pt in bed resting with eyes closed- appears to be sleeping- upon initial approach- respirations even and unlabored. Pt easily aroused from sleep for am medications.  Pt given a pitcher of Gatorade and was encouraged to drink fluids.  Pt states, "I don't know why I couldn't sleep last night". Pt given prn med for withdrawal symptoms (tremors) as ordered. Pt currently denies SI/HI and AV A. Labs and vitals monitored. Pt compliant with medications. Pt supported emotionally and encouraged to express concerns and ask questions.   R. Pt remains safe with 15 minute checks. Will continue POC.

## 2017-06-30 NOTE — H&P (Addendum)
Psychiatric Admission Assessment Adult  Patient Identification: Rodney Watson MRN:  [id number] Date of Evaluation:  06/30/2017 Chief Complaint: Alcohol intoxication  Principal Diagnosis: MDD (major depressive disorder), recurrent severe, without psychosis ([place])  Diagnosis:   Patient Active Problem List   Diagnosis Date Noted  . MDD (major depressive disorder), recurrent severe, without psychosis ([place]) [F33.2] 06/29/2017  . MDD (major depressive disorder), recurrent episode, severe ([place]) [F33.2] 04/13/2017  . Alcohol abuse with alcohol-induced mood disorder ([place]) [F10.14] 04/10/2017  . Alcohol abuse [F10.10] 04/01/2017   History of Present Illness: per assessment note- Patient is a 52 year old male admitted from [facility] for SI with a plan to step in front of a train. Pt reports that he is now seeking alcohol detoxification. Pt is currently homeless and has a hx of of MDD, seizures, COPD and multiple admissions for alcohol abuse. Pt was calm and cooperative during admission interview- answering all questions appropriately.  On evaluation: Patient seen resting in bed, reports " I have answer these questions already, I am her for a alcohol problem" patient presents guarded and irritable during this assessment. Chart reviewed previous admissions noted for depression, suicidal ideation and ethanol abuse. Patient was started on a alcohol detox protocol. Reports depression and suicidal ideation with head nodding ( up and down). Support, encouragement and reassurance as provided.   Associated Signs/Symptoms:  Depression Symptoms:  depressed mood, insomnia, hopelessness, anxiety,  (Hypo) Manic Symptoms:  Impulsivity, Labiality of Mood,  Anxiety Symptoms:  Excessive Worry, Social Anxiety,  Psychotic Symptoms:  Denies any hallucinations, delusional thoughts or paranoia.  PTSD Symptoms:"Yes, I suffered a lot of childhood trauma". Re-experiencing:  Flashbacks Intrusive Thoughts  Total Time spent  with patient: 15 minutes  Past Psychiatric History: noted per 04/2017 assessment- Long history of mental illness as above. Diagnosed with Manic depression 2009. Was on Prozac. He has not taken his medication since his last discharge [facility]. Poor compliance with treatment recommendations. Reports past history of accidental suicidal behavior. No past history of violent behavior. He has been in rehabilitation treatment centters on multiple occassions over the years. He has tried AA in the past. Does not have a sponsor. Recently enrolled in [facility] program.  Is the patient at risk to self? No.  Has the patient been a risk to self in the past 6 months? No.  Has the patient been a risk to self within the distant past? Yes.    Is the patient a risk to others? No.  Has the patient been a risk to others in the past 6 months? No.  Has the patient been a risk to others within the distant past? No.   Prior Inpatient Therapy: Yes: ([facility] x 2 previously)    Prior Outpatient Therapy: Yes [facility]).  Alcohol Screening: Patient refused Alcohol Screening Tool: Yes Substance Abuse History in the last 12 months:  Yes.     Consequences of Substance Abuse: Informed & discussed with patient. Medical Consequences:  Liver damage, Possible death by overdose Legal Consequences:  Arrests, jail time, Loss of driving privilege. Family Consequences:  Family discord, divorce and or separation.  Previous Psychotropic Medications: Yes (Prozac, Trazodone, Seroquel).  Psychological Evaluations: No  Past Medical History:  Past Medical History:  Diagnosis Date  . Alcohol abuse   . COPD (chronic obstructive pulmonary disease) ([place])   . Depression   . ETOH abuse   . Seizures ([place])    History reviewed. No pertinent surgical history.  Family History: History reviewed. No pertinent family  history. Family Psychiatric  History: " I don't know, all my family are dead".  today reports mother has a history of mental  illness,unsure of diagnoses, reports his mother was seen at "[place] carrying a knife" Tobacco Screening:    Social History: Single, no children, unemployed, disabled, collects SSI. Homeless in [place], area. Hx. Seizure disorder. Social History   Substance and Sexual Activity  Alcohol Use Yes   Comment: 5th daily     Social History   Substance and Sexual Activity  Drug Use Yes  . Types: Marijuana   Comment: 1-2 joints/week    Additional Social History:        Allergies:   Allergies  Allergen Reactions  . Chocolate Hives  . Vicodin [Hydrocodone-Acetaminophen] Nausea And Vomiting   Lab Results: No results found for this or any previous visit (from the past 48 hour(s)).  Blood Alcohol level:  Lab Results  Component Value Date   ETH 375 (HH) 06/28/2017   ETH 371 (HH) 40/09/6759   Metabolic Disorder Labs:  No results found for: HGBA1C, MPG No results found for: PROLACTIN No results found for: CHOL, TRIG, HDL, CHOLHDL, VLDL, LDLCALC  Current Medications: Current Facility-Administered Medications  Medication Dose Route Frequency Provider Last Rate Last Dose  . alum & mag hydroxide-simeth (MAALOX/MYLANTA) 200-200-20 MG/5ML suspension 30 mL  30 mL Oral Q4H PRN Ethelene Hal, NP      . FLUoxetine (PROZAC) capsule 20 mg  20 mg Oral Daily Ethelene Hal, NP   20 mg at 06/30/17 0919  . folic acid (FOLVITE) tablet 1 mg  1 mg Oral Daily Ethelene Hal, NP      . gabapentin (NEURONTIN) capsule 300 mg  300 mg Oral TID Ethelene Hal, NP   300 mg at 06/30/17 9509  . hydrOXYzine (ATARAX/VISTARIL) tablet 25 mg  25 mg Oral TID PRN Ethelene Hal, NP   25 mg at 06/29/17 2240  . hydrOXYzine (ATARAX/VISTARIL) tablet 25 mg  25 mg Oral Q6H PRN Ethelene Hal, NP      . lisinopril (PRINIVIL,ZESTRIL) tablet 10 mg  10 mg Oral Daily Ethelene Hal, NP      . LORazepam (ATIVAN) tablet 1 mg  1 mg Oral Q6H PRN Ethelene Hal, NP   1  mg at 06/30/17 3267   Or  . LORazepam (ATIVAN) injection 1 mg  1 mg Intravenous Q6H PRN Ethelene Hal, NP      . magnesium hydroxide (MILK OF MAGNESIA) suspension 30 mL  30 mL Oral Daily PRN Ethelene Hal, NP      . mirtazapine (REMERON) tablet 15 mg  15 mg Oral QHS Ethelene Hal, NP   15 mg at 06/29/17 2110  . multivitamin with minerals tablet 1 tablet  1 tablet Oral Daily Ethelene Hal, NP      . nicotine polacrilex (NICORETTE) gum 2 mg  2 mg Oral PRN Jkwon Treptow, Myer Peer, MD   2 mg at 06/29/17 1905  . phenytoin (DILANTIN) ER capsule 100 mg  100 mg Oral TID Ethelene Hal, NP   100 mg at 06/30/17 0918  . QUEtiapine (SEROQUEL) tablet 100 mg  100 mg Oral QHS Ethelene Hal, NP   100 mg at 06/29/17 2110  . sulfamethoxazole-trimethoprim (BACTRIM DS,SEPTRA DS) 800-160 MG per tablet 1 tablet  1 tablet Oral Q12H Ethelene Hal, NP   1 tablet at 06/30/17 [phone]  . thiamine (VITAMIN B-1) tablet 100 mg  100  mg Oral Daily Ethelene Hal, NP       Or  . thiamine (B-1) injection 100 mg  100 mg Intravenous Daily Ethelene Hal, NP      . traZODone (DESYREL) tablet 50 mg  50 mg Oral QHS PRN Ethelene Hal, NP   50 mg at 06/29/17 2110   PTA Medications: Medications Prior to Admission  Medication Sig Dispense Refill Last Dose  . FLUoxetine (PROZAC) 20 MG capsule Take 1 capsule (20 mg total) by mouth daily. (Patient not taking: Reported on 06/27/2017) 30 capsule 0 Not Taking at Unknown time  . gabapentin (NEURONTIN) 300 MG capsule Take 1 capsule (300 mg total) by mouth 3 (three) times daily. (Patient not taking: Reported on 06/27/2017) 90 capsule 0 Not Taking at Unknown time  . hydrOXYzine (ATARAX/VISTARIL) 25 MG tablet Take 1 tablet (25 mg total) by mouth every 6 (six) hours as needed for anxiety. (Patient not taking: Reported on 06/27/2017) 30 tablet 0 Not Taking at Unknown time  . lisinopril (PRINIVIL,ZESTRIL) 10 MG tablet Take 1 tablet (10 mg  total) by mouth daily. For high blood pressure (Patient not taking: Reported on 06/27/2017) 30 tablet 0 Not Taking at Unknown time  . mirtazapine (REMERON) 15 MG tablet Take 1 tablet (15 mg total) by mouth at bedtime. (Patient not taking: Reported on 06/27/2017) 30 tablet 0 Not Taking at Unknown time  . phenytoin (DILANTIN) 100 MG ER capsule Take 1 capsule (100 mg total) by mouth 3 (three) times daily. 90 capsule 0 Past Month at Unknown time  . QUEtiapine (SEROQUEL) 100 MG tablet Take 1 tablet (100 mg total) by mouth at bedtime. (Patient not taking: Reported on 06/27/2017) 30 tablet 0 Not Taking at Unknown time    Musculoskeletal: Strength & Muscle Tone: within normal limits Gait & Station: normal Patient leans: N/A  Psychiatric Specialty Exam: Physical Exam  Constitutional: He appears well-developed.  HENT:  Head: Normocephalic.  Eyes: Pupils are equal, round, and reactive to light.  Neck: Normal range of motion.  Cardiovascular: Normal rate.  Respiratory: Effort normal.  GI: Soft.  Genitourinary:  Genitourinary Comments: Deferred  Musculoskeletal: Normal range of motion.  Neurological: He is alert.  Skin: Skin is warm.    Review of Systems  Constitutional: Negative for chills and malaise/fatigue.  HENT: Negative.   Eyes: Negative.   Respiratory: Negative.   Cardiovascular: Negative.   Gastrointestinal: Negative.   Genitourinary: Negative.   Musculoskeletal: Negative.   Skin: Negative.   Endo/Heme/Allergies: Negative.   Psychiatric/Behavioral: Positive for depression and substance abuse (BAL 200, UDS (+) for THC). Negative for memory loss. The patient is nervous/anxious and has insomnia.     Blood pressure (!) 101/58, pulse 71, temperature 99.6 F (37.6 C), temperature source Oral, resp. rate 18, height _0  (1.676 m), weight 59 kg (130 lb), SpO2 99 %.Body mass index is 20.98 kg/m.  General Appearance: Disheveled and Guarded  Eye Contact:  Minimal  Speech:  Clear and Coherent  and Normal Rate  Volume:  Normal  Mood:  Anxious and Depressed  Affect:  Congruent and Flat  Thought Process:  Coherent, Goal Directed and Descriptions of Associations: Intact  Orientation:  Full (Time, Place, and Person)  Thought Content:  Logical  Suicidal Thoughts:  Currently denies any thoughts, plans or intent.  Homicidal Thoughts:  Denies  Memory:  Immediate;   Good Recent;   Good Remote;   Good  Judgement:  Good  Insight:  Present  Psychomotor Activity:  Restlessness  and Tremor  Concentration:  Attention Span: Fair  Recall:  Good  Fund of Knowledge:  Fair  Language:  Good  Akathisia:  Negative  Handed:  Right  AIMS (if indicated):     Assets:  Communication Skills Desire for Improvement Resilience  ADL's:  Intact  Cognition:  WNL  Sleep:  Number of Hours: 2   Treatment Plan Summary: Daily contact with patient to assess and evaluate symptoms and progress in treatment:   continue with current treatment plan listed below on 06/30/2017 except where noted   Discharged medications was restarted:   Mood stabilization:  Continue Prozac 20 mg PO QD   Continue Neurontin 300 mg PO TID   Continue Seroquel 100 mg PO HQS  EtoH:  Ativan/ CIWA  Detox Protocol was restarted   Seizure disorder             Continue dilantin ER 112m TID  insomnia             Continue trazodone 1096mqhs   HTN             lisinopril 1025mo qDa  - Encourage group therapy participation - See MAR and SRA for medication management  Observation Level/Precautions:  15 minute checks  Laboratory:  Reviewed  Psychotherapy: Group sessions, AA/NA meetings.  Medications:  See MAR  Consultations: As needed  Discharge Concerns: Safety, maintaining sobriety.  Estimated LOS: 2-5-7day   Other: Admit to the [facility] Primary Diagnosis: MDD (major depressive disorder), recurrent severe, without psychosis (HCCWhat CheerLong Term Goal(s): Improvement in symptoms so as ready  for discharge  Short Term Goals: Ability to disclose and discuss suicidal ideas and Ability to demonstrate self-control will improve  Physician Treatment Plan for Secondary Diagnosis: Principal Problem:   MDD (major depressive disorder), recurrent severe, without psychosis (HCCOtter TailLong Term Goal(s): Improvement in symptoms so as ready for discharge  Short Term Goals: Ability to identify and develop effective coping behaviors will improve and Compliance with prescribed medications will improve     TanDerrill CenterP 5/12/201910:37 AM   I have discussed case with NP and have met with patient  Agree with NP note and assessment  [age]ar old male , single, homeless. Presented to hospital due to depression, suicidal ideations of stepping into an oncoming train, alcohol dependence. Reports sadness and neuro-vegetative symptoms- anhedonia, decreased energy level.  States " I am tired of drinking, I am tired of how I am living , I need to take a rest from alcohol". Reports daily alcohol consumption- usually around 160 ounces of beer per day. Also endorses cannabis abuse. Denies other drug abuse.  Has not been taking his prescribed medications recently  Patient endorses long history of alcohol dependence and history of prior psychiatric admissions. He was last admitted 05/16/17 for alcohol dependence and depression- was discharged on Remeron, Prozac, Neurontin, Dilantin.   Medical History is remarkable for seizures, COPD.  Currently patient presents with symptoms of alcohol WDL- distal tremors, facial flushing, but vitals are stable, and denies visual disturbances or headache.  Dx- Alcohol Dependence- Alcohol Induced Mood Disorder Depressed versus MDD  Plan- Inpatient admission Ativan detox protocol. ( History of elevated LFTs)  Restarted on Dilantin at 100 mgrs TID Restarted on Prozac 20 mgrs QDAY, Neurontin 100 mgrs TID  At this time will not restart Seroquel as it may lower seizure  threshold in the context of ETOH WDL. Will also not restart Remeron  yet to minimize sedation/drug - drug interactions Patient states he is interested in being referred to a rehab setting at discharge.

## 2017-07-01 DIAGNOSIS — F17210 Nicotine dependence, cigarettes, uncomplicated: Secondary | ICD-10-CM

## 2017-07-01 DIAGNOSIS — F1290 Cannabis use, unspecified, uncomplicated: Secondary | ICD-10-CM

## 2017-07-01 DIAGNOSIS — F10239 Alcohol dependence with withdrawal, unspecified: Secondary | ICD-10-CM

## 2017-07-01 MED ORDER — GABAPENTIN 300 MG PO CAPS
300.0000 mg | ORAL_CAPSULE | Freq: Three times a day (TID) | ORAL | Status: DC
  Administered 2017-07-01 – 2017-07-03 (×6): 300 mg via ORAL
  Filled 2017-07-01: qty 1
  Filled 2017-07-01 (×2): qty 21
  Filled 2017-07-01 (×4): qty 1
  Filled 2017-07-01: qty 21
  Filled 2017-07-01 (×5): qty 1

## 2017-07-01 NOTE — Progress Notes (Signed)
CSW left message for pt's Path counselor, Roseanna Rainbow [phone] to inform her of pt's hospitalization and to coordinate aftercare plan. CSW requested that Tamisha call back at her earliest convenience. Pt states that he was supposed to enter a 2 year rehab program in [place], [place] but is unsure of the name. He is unable to discharge today due to withdrawal symptoms.   Trula Slade, MSW, LCSW Clinical Social Worker 07/01/2017 10:12 AM

## 2017-07-01 NOTE — Tx Team (Signed)
Interdisciplinary Treatment and Diagnostic Plan Update  07/01/2017 Time of Session: [id number] Rodney Watson MRN: [id number]  Principal Diagnosis: MDD (major depressive disorder), recurrent severe, without psychosis (HCC)  Secondary Diagnoses: Principal Problem:   MDD (major depressive disorder), recurrent severe, without psychosis (HCC)   Current Medications:  Current Facility-Administered Medications  Medication Dose Route Frequency Provider Last Rate Last Dose  . alum & mag hydroxide-simeth (MAALOX/MYLANTA) 200-200-20 MG/5ML suspension 30 mL  30 mL Oral Q4H PRN Laveda Abbe, NP      . FLUoxetine (PROZAC) capsule 20 mg  20 mg Oral Daily Laveda Abbe, NP   20 mg at 07/01/17 0819  . folic acid (FOLVITE) tablet 1 mg  1 mg Oral Daily Laveda Abbe, NP   1 mg at 07/01/17 8295  . gabapentin (NEURONTIN) capsule 100 mg  100 mg Oral TID Cobos, Rockey Situ, MD   100 mg at 07/01/17 0819  . hydrOXYzine (ATARAX/VISTARIL) tablet 25 mg  25 mg Oral Q6H PRN Cobos, Rockey Situ, MD   25 mg at 06/30/17 2044  . lisinopril (PRINIVIL,ZESTRIL) tablet 10 mg  10 mg Oral Daily Laveda Abbe, NP   10 mg at 07/01/17 0819  . loperamide (IMODIUM) capsule 2-4 mg  2-4 mg Oral PRN Cobos, Rockey Situ, MD      . LORazepam (ATIVAN) tablet 1 mg  1 mg Oral Q6H PRN Cobos, Rockey Situ, MD   1 mg at 06/30/17 2110  . LORazepam (ATIVAN) tablet 1 mg  1 mg Oral QID Cobos, Rockey Situ, MD   1 mg at 07/01/17 0819   Followed by  . LORazepam (ATIVAN) tablet 1 mg  1 mg Oral TID Cobos, Rockey Situ, MD       Followed by  . [START ON 07/02/2017] LORazepam (ATIVAN) tablet 1 mg  1 mg Oral BID Cobos, Rockey Situ, MD       Followed by  . [START ON 07/04/2017] LORazepam (ATIVAN) tablet 1 mg  1 mg Oral Daily Cobos, Fernando A, MD      . magnesium hydroxide (MILK OF MAGNESIA) suspension 30 mL  30 mL Oral Daily PRN Laveda Abbe, NP      . multivitamin with minerals tablet 1 tablet  1 tablet Oral Daily Cobos,  Rockey Situ, MD   1 tablet at 07/01/17 6213  . nicotine polacrilex (NICORETTE) gum 2 mg  2 mg Oral PRN Cobos, Rockey Situ, MD   2 mg at 06/30/17 1846  . ondansetron (ZOFRAN-ODT) disintegrating tablet 4 mg  4 mg Oral Q6H PRN Cobos, Rockey Situ, MD      . phenytoin (DILANTIN) ER capsule 100 mg  100 mg Oral TID Laveda Abbe, NP   100 mg at 07/01/17 0819  . sulfamethoxazole-trimethoprim (BACTRIM DS,SEPTRA DS) 800-160 MG per tablet 1 tablet  1 tablet Oral Q12H Laveda Abbe, NP   1 tablet at 07/01/17 [phone]  . thiamine (VITAMIN B-1) tablet 100 mg  100 mg Oral Daily Cobos, Rockey Situ, MD   100 mg at 07/01/17 0819  . traZODone (DESYREL) tablet 100 mg  100 mg Oral QHS,MR X 1 Kerry Hough, PA-C   100 mg at 06/30/17 2250   PTA Medications: Medications Prior to Admission  Medication Sig Dispense Refill Last Dose  . FLUoxetine (PROZAC) 20 MG capsule Take 1 capsule (20 mg total) by mouth daily. (Patient not taking: Reported on 06/27/2017) 30 capsule 0 Not Taking at Unknown time  . gabapentin (NEURONTIN) 300 MG capsule Take 1  capsule (300 mg total) by mouth 3 (three) times daily. (Patient not taking: Reported on 06/27/2017) 90 capsule 0 Not Taking at Unknown time  . hydrOXYzine (ATARAX/VISTARIL) 25 MG tablet Take 1 tablet (25 mg total) by mouth every 6 (six) hours as needed for anxiety. (Patient not taking: Reported on 06/27/2017) 30 tablet 0 Not Taking at Unknown time  . lisinopril (PRINIVIL,ZESTRIL) 10 MG tablet Take 1 tablet (10 mg total) by mouth daily. For high blood pressure (Patient not taking: Reported on 06/27/2017) 30 tablet 0 Not Taking at Unknown time  . mirtazapine (REMERON) 15 MG tablet Take 1 tablet (15 mg total) by mouth at bedtime. (Patient not taking: Reported on 06/27/2017) 30 tablet 0 Not Taking at Unknown time  . phenytoin (DILANTIN) 100 MG ER capsule Take 1 capsule (100 mg total) by mouth 3 (three) times daily. 90 capsule 0 Past Month at Unknown time  . QUEtiapine (SEROQUEL) 100 MG  tablet Take 1 tablet (100 mg total) by mouth at bedtime. (Patient not taking: Reported on 06/27/2017) 30 tablet 0 Not Taking at Unknown time    Patient Stressors: Financial difficulties Loss of homeless Substance abuse  Patient Strengths: Active sense of humor Communication skills Motivation for treatment/growth  Treatment Modalities: Medication Management, Group therapy, Case management,  1 to 1 session with clinician, Psychoeducation, Recreational therapy.   Physician Treatment Plan for Primary Diagnosis: MDD (major depressive disorder), recurrent severe, without psychosis (HCC) Long Term Goal(s): Improvement in symptoms so as ready for discharge Improvement in symptoms so as ready for discharge   Short Term Goals: Ability to disclose and discuss suicidal ideas Ability to demonstrate self-control will improve Ability to identify and develop effective coping behaviors will improve Compliance with prescribed medications will improve  Medication Management: Evaluate patient's response, side effects, and tolerance of medication regimen.  Therapeutic Interventions: 1 to 1 sessions, Unit Group sessions and Medication administration.  Evaluation of Outcomes: Progressing  Physician Treatment Plan for Secondary Diagnosis: Principal Problem:   MDD (major depressive disorder), recurrent severe, without psychosis (HCC)  Long Term Goal(s): Improvement in symptoms so as ready for discharge Improvement in symptoms so as ready for discharge   Short Term Goals: Ability to disclose and discuss suicidal ideas Ability to demonstrate self-control will improve Ability to identify and develop effective coping behaviors will improve Compliance with prescribed medications will improve     Medication Management: Evaluate patient's response, side effects, and tolerance of medication regimen.  Therapeutic Interventions: 1 to 1 sessions, Unit Group sessions and Medication administration.  Evaluation  of Outcomes: Progressing   RN Treatment Plan for Primary Diagnosis: MDD (major depressive disorder), recurrent severe, without psychosis (HCC) Long Term Goal(s): Knowledge of disease and therapeutic regimen to maintain health will improve  Short Term Goals: Ability to remain free from injury will improve, Ability to demonstrate self-control, Ability to disclose and discuss suicidal ideas and Ability to identify and develop effective coping behaviors will improve  Medication Management: RN will administer medications as ordered by provider, will assess and evaluate patient's response and provide education to patient for prescribed medication. RN will report any adverse and/or side effects to prescribing provider.  Therapeutic Interventions: 1 on 1 counseling sessions, Psychoeducation, Medication administration, Evaluate responses to treatment, Monitor vital signs and CBGs as ordered, Perform/monitor CIWA, COWS, AIMS and Fall Risk screenings as ordered, Perform wound care treatments as ordered.  Evaluation of Outcomes: Progressing   LCSW Treatment Plan for Primary Diagnosis: MDD (major depressive disorder), recurrent severe, without psychosis (HCC)  Long Term Goal(s): Safe transition to appropriate next level of care at discharge, Engage patient in therapeutic group addressing interpersonal concerns.  Short Term Goals: Engage patient in aftercare planning with referrals and resources, Facilitate patient progression through stages of change regarding substance use diagnoses and concerns and Identify triggers associated with mental health/substance abuse issues  Therapeutic Interventions: Assess for all discharge needs, 1 to 1 time with Social worker, Explore available resources and support systems, Assess for adequacy in community support network, Educate family and significant other(s) on suicide prevention, Complete Psychosocial Assessment, Interpersonal group therapy.  Evaluation of Outcomes:  Progressing   Progress in Treatment: Attending groups: No. Participating in groups: No. Taking medication as prescribed: Yes. Toleration medication: Yes. Family/Significant other contact made: SPE completed with pt; pt declined to consent to collateral contact.  Patient understands diagnosis: Yes. Discussing patient identified problems/goals with staff: Yes. Medical problems stabilized or resolved: Yes. Denies suicidal/homicidal ideation: Yes. Issues/concerns per patient self-inventory: No. Other: n/a   New problem(s) identified: No, Describe:  n/a  New Short Term/Long Term Goal(s): detox, medication management for mood stabilization; elimination of SI thoughts; development of comprehensive mental wellness/sobriety plan.   Patient Goal: "to detox and get into a 2 year treatment program."   Discharge Plan or Barriers: CSW assessing. Pt thinks that he has treatment bed in [place] available for Monday but is not safe to discharge today due to withdrawals. He does not have contact number for Path team member through [facility]. CSW attempting to contact Tamisha in PATH program to coordinate discharge plan. [facility] for outpatient if he chooses to stay in [place].   Reason for Continuation of Hospitalization: Depression Medication stabilization Withdrawal symptoms  Estimated Length of Stay: Monday, 07/01/17  Attendees: Patient: Rodney Watson  07/01/2017 8:53 AM  Physician: Dr. Altamese Medford Lakes MD; Dr. Jola Babinski MD 07/01/2017 8:53 AM  Nursing: Marchelle Folks RN 07/01/2017 8:53 AM  RN Care Manager:x 07/01/2017 8:53 AM  Social Worker: Trula Slade, LCSW 07/01/2017 8:53 AM  Recreational Therapist: x 07/01/2017 8:53 AM  Other: Armandina Stammer NP; Nanine Means NP; Hillery Jacks NP 07/01/2017 8:53 AM  Other:  07/01/2017 8:53 AM  Other: 07/01/2017 8:53 AM    Scribe for Treatment Team: Ledell Peoples Smart, LCSW 07/01/2017 8:53 AM

## 2017-07-01 NOTE — [facility] Suicide Risk Assessment (Signed)
[facility] INPATIENT:  Family/Significant Other Suicide Prevention Education  Suicide Prevention Education:  Patient Refusal for Family/Significant Other Suicide Prevention Education: The patient Rodney Watson has refused to provide written consent for family/significant other to be provided Family/Significant Other Suicide Prevention Education during admission and/or prior to discharge.  Physician notified.  SPE completed with pt, as pt refused to consent to family contact. SPI pamphlet provided to pt and pt was encouraged to share information with support network, ask questions, and talk about any concerns relating to SPE. Pt denies access to guns/firearms and verbalized understanding of information provided. Mobile Crisis information also provided to pt.   Annora Guderian N Smart LCSW 07/01/2017, 10:10 AM

## 2017-07-01 NOTE — Plan of Care (Signed)
  Problem: Safety: Goal: Periods of time without injury will increase Outcome: Progressing   Problem: Medication: Goal: Compliance with prescribed medication regimen will improve Outcome: Progressing DAR NOTE: Patient presents with anxious affect and mood.  Denies suicidal thoughts, pain, auditory and visual hallucinations.  Reports withdrawal symptoms of tremors, agitation and irritability on self inventory form.  Described energy level as low and concentration as good.  Rates depression at 7, hopelessness at 5 and anxiety at 7.  Maintained on routine safety checks.  Medications given as prescribed.  Support and encouragement offered as needed.  Attended group and participated.  States goal for today is "to get through it."  Patient visible in milieu watching TV with minimal interaction with peers.
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
## 2017-07-01 NOTE — Progress Notes (Addendum)
[facility] MD Progress Note  07/01/2017 10:34 AM Rodney Watson  MRN:  [id number] Subjective:  "I feel like crap."  Complaining of having shakes and some dry heaves.  6/10 depression and anxiety, sleep and appetite are "fair to poor".  During this evaluation, patient is alert and oriented x4 and cooperative.Patient isshowing slight improvement in moodand with a 6/10 depression and anxietywith no suicidal ideations.  His affectshowsslight improvement noted compared to his presentation on admission.Sleep and appetite are "fair to poor". He complains of having some dry heaves and tremors, gabapentin added to his detox protocol. He is resting in his bed and cannot attend groups at this time due to not feeling well. Fluids encouraged and rest.  Williamdenies any SI, HI or AVH and does not appear to be responding to internal stimuli. He has remained free fromself harmingbehaviors on the unit.  At this time, he is contracting for safety on the unit.  Continued Prozac for depression, Ativan detox protocol, hydroxyzine for anxiety, and Trazodone for sleep, denies side effects and withdrawal symptoms, monitoring continues.  Will start gabapentin for anxiety and withdrawal symptoms.  Principal Problem: MDD (major depressive disorder), recurrent severe, without psychosis ([place]) Diagnosis:   Patient Active Problem List   Diagnosis Date Noted  . MDD (major depressive disorder), recurrent severe, without psychosis ([place]) [F33.2] 06/29/2017    Priority: High  . Alcohol abuse with alcohol-induced mood disorder ([place]) [F10.14] 04/10/2017    Priority: High  . MDD (major depressive disorder), recurrent episode, severe ([place]) [F33.2] 04/13/2017  . Alcohol abuse [F10.10] 04/01/2017   Total Time spent with patient: 30 minutes  Past Psychiatric History: depression, alcohol dependence  Past Medical History:  Past Medical History:  Diagnosis Date  . Alcohol abuse   . COPD (chronic obstructive pulmonary disease)  ([place])   . Depression   . ETOH abuse   . Seizures ([place])    History reviewed. No pertinent surgical history. Family History: History reviewed. No pertinent family history. Family Psychiatric  History: none Social History:  Social History   Substance and Sexual Activity  Alcohol Use Yes   Comment: 5th daily     Social History   Substance and Sexual Activity  Drug Use Yes  . Types: Marijuana   Comment: 1-2 joints/week    Social History   Socioeconomic History  . Marital status: Single    Spouse name: Not on file  . Number of children: Not on file  . Years of education: Not on file  . Highest education level: Not on file  Occupational History  . Not on file  Social Needs  . Financial resource strain: Not on file  . Food insecurity:    Worry: Not on file    Inability: Not on file  . Transportation needs:    Medical: Not on file    Non-medical: Not on file  Tobacco Use  . Smoking status: Current Every Day Smoker    Packs/day: 1.50    Years: 39.00    Pack years: 58.50    Types: Cigarettes  . Smokeless tobacco: Current User    Types: Chew  Substance and Sexual Activity  . Alcohol use: Yes    Comment: 5th daily  . Drug use: Yes    Types: Marijuana    Comment: 1-2 joints/week  . Sexual activity: Never  Lifestyle  . Physical activity:    Days per week: Not on file    Minutes per session: Not on file  . Stress: Not on file  Relationships  . Social connections:    Talks on phone: Not on file    Gets together: Not on file    Attends religious service: Not on file    Active member of club or organization: Not on file    Attends meetings of clubs or organizations: Not on file    Relationship status: Not on file  Other Topics Concern  . Not on file  Social History Narrative   ** Merged History Encounter **       Additional Social History:                         Sleep: Fair to poor  Appetite:  Fair to poor  Current Medications: Current  Facility-Administered Medications  Medication Dose Route Frequency Provider Last Rate Last Dose  . alum & mag hydroxide-simeth (MAALOX/MYLANTA) 200-200-20 MG/5ML suspension 30 mL  30 mL Oral Q4H PRN Ethelene Hal, NP      . FLUoxetine (PROZAC) capsule 20 mg  20 mg Oral Daily Ethelene Hal, NP   20 mg at 07/01/17 0819  . folic acid (FOLVITE) tablet 1 mg  1 mg Oral Daily Ethelene Hal, NP   1 mg at 07/01/17 8850  . gabapentin (NEURONTIN) capsule 300 mg  300 mg Oral TID Patrecia Pour, NP      . hydrOXYzine (ATARAX/VISTARIL) tablet 25 mg  25 mg Oral Q6H PRN Cobos, Myer Peer, MD   25 mg at 06/30/17 2044  . lisinopril (PRINIVIL,ZESTRIL) tablet 10 mg  10 mg Oral Daily Ethelene Hal, NP   10 mg at 07/01/17 0819  . loperamide (IMODIUM) capsule 2-4 mg  2-4 mg Oral PRN Cobos, Myer Peer, MD      . LORazepam (ATIVAN) tablet 1 mg  1 mg Oral Q6H PRN Cobos, Myer Peer, MD   1 mg at 06/30/17 2110  . LORazepam (ATIVAN) tablet 1 mg  1 mg Oral QID Cobos, Myer Peer, MD   1 mg at 07/01/17 0819   Followed by  . LORazepam (ATIVAN) tablet 1 mg  1 mg Oral TID Cobos, Myer Peer, MD       Followed by  . [START ON 07/02/2017] LORazepam (ATIVAN) tablet 1 mg  1 mg Oral BID Cobos, Myer Peer, MD       Followed by  . [START ON 07/04/2017] LORazepam (ATIVAN) tablet 1 mg  1 mg Oral Daily Cobos, Fernando A, MD      . magnesium hydroxide (MILK OF MAGNESIA) suspension 30 mL  30 mL Oral Daily PRN Ethelene Hal, NP      . multivitamin with minerals tablet 1 tablet  1 tablet Oral Daily Cobos, Myer Peer, MD   1 tablet at 07/01/17 2774  . nicotine polacrilex (NICORETTE) gum 2 mg  2 mg Oral PRN Cobos, Myer Peer, MD   2 mg at 06/30/17 1846  . ondansetron (ZOFRAN-ODT) disintegrating tablet 4 mg  4 mg Oral Q6H PRN Cobos, Myer Peer, MD      . phenytoin (DILANTIN) ER capsule 100 mg  100 mg Oral TID Ethelene Hal, NP   100 mg at 07/01/17 0819  . sulfamethoxazole-trimethoprim (BACTRIM  DS,SEPTRA DS) 800-160 MG per tablet 1 tablet  1 tablet Oral Q12H Ethelene Hal, NP   1 tablet at 07/01/17 [phone]  . thiamine (VITAMIN B-1) tablet 100 mg  100 mg Oral Daily Cobos, Myer Peer, MD   100 mg at 07/01/17 0819  .  traZODone (DESYREL) tablet 100 mg  100 mg Oral QHS,MR X 1 Laverle Hobby, PA-C   100 mg at 06/30/17 2250    Lab Results: No results found for this or any previous visit (from the past 48 hour(s)).  Blood Alcohol level:  Lab Results  Component Value Date   ETH 375 (HH) 06/28/2017   ETH 371 (HH) 75/44/9201    Metabolic Disorder Labs: No results found for: HGBA1C, MPG No results found for: PROLACTIN No results found for: CHOL, TRIG, HDL, CHOLHDL, VLDL, LDLCALC  Physical Findings: AIMS: Facial and Oral Movements Muscles of Facial Expression: None, normal Lips and Perioral Area: None, normal Jaw: None, normal Tongue: None, normal,Extremity Movements Upper (arms, wrists, hands, fingers): None, normal Lower (legs, knees, ankles, toes): None, normal, Trunk Movements Neck, shoulders, hips: None, normal, Overall Severity Severity of abnormal movements (highest score from questions above): None, normal Incapacitation due to abnormal movements: None, normal Patient's awareness of abnormal movements (rate only patient's report): No Awareness, Dental Status Current problems with teeth and/or dentures?: No Does patient usually wear dentures?: No  CIWA:  CIWA-Ar Total: 10 COWS:     Musculoskeletal: Strength & Muscle Tone: within normal limits Gait & Station: normal Patient leans: N/A  Psychiatric Specialty Exam: Physical Exam  Constitutional: He is oriented to person, place, and time. He appears well-developed and well-nourished.  HENT:  Head: Normocephalic.  Neck: Normal range of motion.  Respiratory: Effort normal.  Musculoskeletal: Normal range of motion.  Neurological: He is alert and oriented to person, place, and time.  Psychiatric: His speech is  normal and behavior is normal. Judgment and thought content normal. His mood appears anxious. Cognition and memory are normal. He exhibits a depressed mood.    Review of Systems  Psychiatric/Behavioral: Positive for depression and substance abuse. The patient is nervous/anxious.   All other systems reviewed and are negative.   Blood pressure 121/88, pulse 81, temperature 98.3 F (36.8 C), temperature source Oral, resp. rate 16, height 5' 6"  (1.676 m), weight 59 kg (130 lb), SpO2 99 %.Body mass index is 20.98 kg/m.  General Appearance: Casual  Eye Contact:  Good  Speech:  Normal Rate  Volume:  Decreased  Mood:  Anxious and Depressed  Affect:  Congruent  Thought Process:  Coherent and Descriptions of Associations: Intact  Orientation:  Full (Time, Place, and Person)  Thought Content:  Rumination  Suicidal Thoughts:  No  Homicidal Thoughts:  No  Memory:  Immediate;   Fair Recent;   Fair Remote;   Fair  Judgement:  Fair  Insight:  Fair  Psychomotor Activity:  Decreased  Concentration:  Concentration: Fair and Attention Span: Fair  Recall:  [employer] of Knowledge:  Fair  Language:  Good  Akathisia:  No  Handed:  Right  AIMS (if indicated):     Assets:  Leisure Time Physical Health Resilience Social Support  ADL's:  Intact  Cognition:  WNL  Sleep:  Number of Hours: 2   Treatment Plan Summary:Reviewed current treatment plan, Will continue the following plan without adjustment at this time. Daily contact with patient to assess and evaluate symptoms and progress in treatment  Major depressive disorder, recurrent, severe without psychosis:  Medication: To reduce current symptoms to base line and improve the patient's overall level of functioning will continue Medication management as follows: -Continue Prozac 20 mg daily for depression  Alcohol dependence: -Continue ATivan protocol -Started gabapentin 300 mg TID for withdrawal symptoms and anxiety  Anxiety --Started  gabapentin 300 mg TID for anxiety and withdrawal symptoms -Continued hydroxyzine 25 mg every 6 hours PRN anxiety  Insomnia: -Continue Trazodone 100 mg at bedtime sleep  Plan: 1. Patient admitted to the adult unit at [facility] under the service of Dr. Mallie Darting 2. Routine labs: UDS negative for drugs, 298 alcohol. U/A:  Hazy appearance with ketones 5, above the norm of 0.  TSH, glucose, CK, acetaminophen, and salicylate levels are WDL.  CBC & dDiff WNL except HCT slight decrease at 38.9.  Met B WDL except  Low sodium 134, chloride 100, CO2 21, calcium 8.2, and total protein of 6.1.  AST elevated at 71 3. Will maintain Q 15 minutes observation for safety.Estimated LOS: 5-7 days  4. During this hospitalization the patient will receive a psychosocialassessment. 5. Patient will participate ingroup, milieu, and family therapy.Psychotherapy: Social and Airline pilot, anti-bullying, learning based strategies, cognitive behavioral, and family object relations individuation separation intervention psychotherapies can be considered. 6. Will continue to monitor patient's mood and behavior. 7. Social Work willschedule a Family meeting to obtain collateral information and discuss discharge and follow up plan.Discharge concerns will also be addressed: Safety, stabilization, and access to medication 8. Discharge date: 07/02/2017.   Waylan Boga, NP 07/01/2017, 10:34 AM

## 2017-07-01 NOTE — Progress Notes (Signed)
Adult Psychoeducational Group Note  Date:  07/01/2017 Time:  9:16 PM  Group Topic/Focus:  Wrap-Up Group:   The focus of this group is to help patients review their daily goal of treatment and discuss progress on daily workbooks.  Participation Level:  Active  Participation Quality:  Appropriate  Affect:  Appropriate  Cognitive:  Alert  Insight: Appropriate  Engagement in Group:  Engaged  Modes of Intervention:  Discussion  Additional Comments:  Patient stated having a fair day. Patient's goal for today was to get through the day.  [name] L Ciel Yanes 07/01/2017, 9:16 PM

## 2017-07-01 NOTE — [facility] Group Notes (Signed)
LCSW Group Therapy Note   07/01/2017 1:15pm   Type of Therapy and Topic:  Group Therapy:  Overcoming Obstacles   Participation Level:  Did Not Attend--pt invited. Chose to remain in bed.    Description of Group:    In this group patients will be encouraged to explore what they see as obstacles to their own wellness and recovery. They will be guided to discuss their thoughts, feelings, and behaviors related to these obstacles. The group will process together ways to cope with barriers, with attention given to specific choices patients can make. Each patient will be challenged to identify changes they are motivated to make in order to overcome their obstacles. This group will be process-oriented, with patients participating in exploration of their own experiences as well as giving and receiving support and challenge from other group members.   Therapeutic Goals: 1. Patient will identify personal and current obstacles as they relate to admission. 2. Patient will identify barriers that currently interfere with their wellness or overcoming obstacles.  3. Patient will identify feelings, thought process and behaviors related to these barriers. 4. Patient will identify two changes they are willing to make to overcome these obstacles:      Summary of Patient Progress   x    Therapeutic Modalities:   Cognitive Behavioral Therapy Solution Focused Therapy Motivational Interviewing Relapse Prevention Therapy  Ivan Lacher N Smart, LCSW 07/01/2017 10:30 AM  

## 2017-07-01 NOTE — Progress Notes (Signed)
D.  Pt pleasant on approach, somewhat less confused tonight than last.  Pt requested medication for sleep.  Pt was positive for evening AA group but walked out and in at times.  Pt denies SI/HI/AVH at this time.  Pt observed engaged in appropriate interaction with peers on the unit.  A.  Support and encouragement offered, provider notified of request for medication for insomnia and new orders received.  Pt slept 2 hours last night.  R.  Pt pleased with new order but confused as to why he is not getting Remeron.  Explained reasons in doctor's note to Pt and though Pt verbalized understanding, he did continue to ask.  Pt remains safe on the unit, will continue to monitor.

## 2017-07-01 NOTE — Progress Notes (Signed)
Pt did not attend goals or orientation group this morning. Pt has been sleep all morning, until lunch.

## 2017-07-01 NOTE — Progress Notes (Signed)
Patient did not attend the evening speaker AA meeting. Pt did go to group but exited shortly after it began. Pt remained in hall close in view of this Clinical research associate. Pt reported being confused and depressed. Pt requested a Vistaril and nurse was notified .

## 2017-07-02 DIAGNOSIS — F1020 Alcohol dependence, uncomplicated: Secondary | ICD-10-CM

## 2017-07-02 NOTE — Progress Notes (Signed)
Recreation Therapy Notes  Animal-Assisted Activity (AAA) Program Checklist/Progress Notes Patient Eligibility Criteria Checklist & Daily Group note for Rec Tx Intervention  Date: 5.14.19 Time: 1430 Location: [address]   AAA/T Program Assumption of Risk Form signed by Engineer, production or Parent Legal Guardian YES   Patient is free of allergies or sever asthma NO  Patient reports no fear of animals YES   Patient reports no history of cruelty to animals YES   Patient understands his/her participation is voluntary YES   Patient washes hands before animal contact YES   Patient washes hands after animal contact YES   Education: Charity fundraiser, Appropriate Animal Interaction   Education Outcome: Acknowledges understanding/In group clarification offered/Needs additional education.   Clinical Observations/Feedback: Pt did not attend group.    Caroll Rancher, LRT/CTRS         Caroll Rancher A 07/02/2017 3:58 PM

## 2017-07-02 NOTE — Progress Notes (Addendum)
Patient ID: Rodney Watson, male   DOB: 01-Aug-1965, 52 y.o.   MRN: [id number] [facility] MD Progress Note  07/02/2017 9:46 AM [place]  MRN:  [id number] Subjective:  "I feel alright."  Denies withdrawal symptoms includes the shakes and dry heaves.  6/10 depression and 4/10 anxiety, sleep and appetite are "fair".  He is looking forward to going to long-term treatment/rehab tomorrow.  During this evaluation, patient is alert and oriented x4 and cooperative.Patient isshowing improvement in moodand with a 6/10 depression and 2/10 anxiety with no suicidal ideations.  His affectshowsimprovement noted compared to his presentation on admission.Sleep and appetite are "fair", an improvement. He denies any withdrawal symptoms and is up and out of bed this morning.  Gwyndolyn Saxon plans on attending groups today since he is feeling better.  Williamdenies any SI, HI or AVH and does not appear to be responding to internal stimuli. He has remained free fromself harmingbehaviors on the unit.  At this time, he is contracting for safety on the unit.  Continued Prozac for depression, Ativan detox protocol, hydroxyzine for anxiety, and Trazodone for sleep, denies side effects and withdrawal symptoms, monitoring continues.  Started gabapentin for anxiety and withdrawal symptoms with positive effects.  Principal Problem: MDD (major depressive disorder), recurrent severe, without psychosis ([place]) Diagnosis:   Patient Active Problem List   Diagnosis Date Noted  . MDD (major depressive disorder), recurrent severe, without psychosis ([place]) [F33.2] 06/29/2017    Priority: High  . Alcohol abuse with alcohol-induced mood disorder ([place]) [F10.14] 04/10/2017    Priority: High  . MDD (major depressive disorder), recurrent episode, severe ([place]) [F33.2] 04/13/2017  . Alcohol abuse [F10.10] 04/01/2017   Total Time spent with patient: 30 minutes  Past Psychiatric History: depression, alcohol dependence  Past Medical History:   Past Medical History:  Diagnosis Date  . Alcohol abuse   . COPD (chronic obstructive pulmonary disease) ([place])   . Depression   . ETOH abuse   . Seizures ([place])    History reviewed. No pertinent surgical history. Family History: History reviewed. No pertinent family history. Family Psychiatric  History: none Social History:  Social History   Substance and Sexual Activity  Alcohol Use Yes   Comment: 5th daily     Social History   Substance and Sexual Activity  Drug Use Yes  . Types: Marijuana   Comment: 1-2 joints/week    Social History   Socioeconomic History  . Marital status: Single    Spouse name: Not on file  . Number of children: Not on file  . Years of education: Not on file  . Highest education level: Not on file  Occupational History  . Not on file  Social Needs  . Financial resource strain: Not on file  . Food insecurity:    Worry: Not on file    Inability: Not on file  . Transportation needs:    Medical: Not on file    Non-medical: Not on file  Tobacco Use  . Smoking status: Current Every Day Smoker    Packs/day: 1.50    Years: 39.00    Pack years: 58.50    Types: Cigarettes  . Smokeless tobacco: Current User    Types: Chew  Substance and Sexual Activity  . Alcohol use: Yes    Comment: 5th daily  . Drug use: Yes    Types: Marijuana    Comment: 1-2 joints/week  . Sexual activity: Never  Lifestyle  . Physical activity:    Days per week: Not on  file    Minutes per session: Not on file  . Stress: Not on file  Relationships  . Social connections:    Talks on phone: Not on file    Gets together: Not on file    Attends religious service: Not on file    Active member of club or organization: Not on file    Attends meetings of clubs or organizations: Not on file    Relationship status: Not on file  Other Topics Concern  . Not on file  Social History Narrative   ** Merged History Encounter **       Additional Social History:                          Sleep: Fair to poor  Appetite:  Fair to poor  Current Medications: Current Facility-Administered Medications  Medication Dose Route Frequency Provider Last Rate Last Dose  . alum & mag hydroxide-simeth (MAALOX/MYLANTA) 200-200-20 MG/5ML suspension 30 mL  30 mL Oral Q4H PRN Ethelene Hal, NP      . FLUoxetine (PROZAC) capsule 20 mg  20 mg Oral Daily Ethelene Hal, NP   20 mg at 07/02/17 0803  . folic acid (FOLVITE) tablet 1 mg  1 mg Oral Daily Ethelene Hal, NP   1 mg at 07/02/17 9169  . gabapentin (NEURONTIN) capsule 300 mg  300 mg Oral TID Patrecia Pour, NP   300 mg at 07/02/17 4503  . hydrOXYzine (ATARAX/VISTARIL) tablet 25 mg  25 mg Oral Q6H PRN Kenric Ginger, Myer Peer, MD   25 mg at 06/30/17 2044  . lisinopril (PRINIVIL,ZESTRIL) tablet 10 mg  10 mg Oral Daily Ethelene Hal, NP   10 mg at 07/02/17 0803  . loperamide (IMODIUM) capsule 2-4 mg  2-4 mg Oral PRN Clancy Leiner, Myer Peer, MD      . LORazepam (ATIVAN) tablet 1 mg  1 mg Oral Q6H PRN Sway Guttierrez, Myer Peer, MD   1 mg at 07/01/17 1333  . LORazepam (ATIVAN) tablet 1 mg  1 mg Oral TID Corri Delapaz, Myer Peer, MD   1 mg at 07/02/17 0804   Followed by  . LORazepam (ATIVAN) tablet 1 mg  1 mg Oral BID Loda Bialas, Myer Peer, MD       Followed by  . [START ON 07/04/2017] LORazepam (ATIVAN) tablet 1 mg  1 mg Oral Daily Stephonie Wilcoxen A, MD      . magnesium hydroxide (MILK OF MAGNESIA) suspension 30 mL  30 mL Oral Daily PRN Ethelene Hal, NP      . multivitamin with minerals tablet 1 tablet  1 tablet Oral Daily Shaira Sova, Myer Peer, MD   1 tablet at 07/02/17 0804  . nicotine polacrilex (NICORETTE) gum 2 mg  2 mg Oral PRN Jolin Benavides, Myer Peer, MD   2 mg at 07/01/17 1257  . ondansetron (ZOFRAN-ODT) disintegrating tablet 4 mg  4 mg Oral Q6H PRN Darria Corvera, Myer Peer, MD      . phenytoin (DILANTIN) ER capsule 100 mg  100 mg Oral TID Ethelene Hal, NP   100 mg at 07/02/17 0804  . sulfamethoxazole-trimethoprim  (BACTRIM DS,SEPTRA DS) 800-160 MG per tablet 1 tablet  1 tablet Oral Q12H Ethelene Hal, NP   1 tablet at 07/02/17 0804  . thiamine (VITAMIN B-1) tablet 100 mg  100 mg Oral Daily Moris Ratchford, Myer Peer, MD   100 mg at 07/02/17 0804  . traZODone (DESYREL) tablet 100 mg  100 mg Oral QHS,MR X 1 Laverle Hobby, PA-C   100 mg at 07/01/17 2109    Lab Results: No results found for this or any previous visit (from the past 48 hour(s)).  Blood Alcohol level:  Lab Results  Component Value Date   ETH 375 (HH) 06/28/2017   ETH 371 (HH) 16/11/9602    Metabolic Disorder Labs: No results found for: HGBA1C, MPG No results found for: PROLACTIN No results found for: CHOL, TRIG, HDL, CHOLHDL, VLDL, LDLCALC  Physical Findings: AIMS: Facial and Oral Movements Muscles of Facial Expression: None, normal Lips and Perioral Area: None, normal Jaw: None, normal Tongue: None, normal,Extremity Movements Upper (arms, wrists, hands, fingers): None, normal Lower (legs, knees, ankles, toes): None, normal, Trunk Movements Neck, shoulders, hips: None, normal, Overall Severity Severity of abnormal movements (highest score from questions above): None, normal Incapacitation due to abnormal movements: None, normal Patient's awareness of abnormal movements (rate only patient's report): No Awareness, Dental Status Current problems with teeth and/or dentures?: No Does patient usually wear dentures?: No  CIWA:  CIWA-Ar Total: 11 COWS:     Musculoskeletal: Strength & Muscle Tone: within normal limits Gait & Station: normal Patient leans: N/A  Psychiatric Specialty Exam: Physical Exam  Constitutional: He is oriented to person, place, and time. He appears well-developed and well-nourished.  HENT:  Head: Normocephalic.  Neck: Normal range of motion.  Respiratory: Effort normal.  Musculoskeletal: Normal range of motion.  Neurological: He is alert and oriented to person, place, and time.  Psychiatric: His  speech is normal and behavior is normal. Judgment and thought content normal. His mood appears anxious. Cognition and memory are normal. He exhibits a depressed mood.    Review of Systems  Psychiatric/Behavioral: Positive for depression and substance abuse. The patient is nervous/anxious.   All other systems reviewed and are negative.   Blood pressure 128/80, pulse 76, temperature 98.8 F (37.1 C), temperature source Oral, resp. rate 16, height _0  (1.676 m), weight 59 kg (130 lb), SpO2 99 %.Body mass index is 20.98 kg/m.  General Appearance: Casual  Eye Contact:  Good  Speech:  Normal Rate  Volume:  Decreased  Mood:  Anxious and Depressed  Affect:  Congruent  Thought Process:  Coherent and Descriptions of Associations: Intact  Orientation:  Full (Time, Place, and Person)  Thought Content:  Rumination  Suicidal Thoughts:  No  Homicidal Thoughts:  No  Memory:  Immediate;   Fair Recent;   Fair Remote;   Fair  Judgement:  Fair  Insight:  Fair  Psychomotor Activity:  Decreased  Concentration:  Concentration: Fair and Attention Span: Fair  Recall:  [employer] of Knowledge:  Fair  Language:  Good  Akathisia:  No  Handed:  Right  AIMS (if indicated):     Assets:  Leisure Time Physical Health Resilience Social Support  ADL's:  Intact  Cognition:  WNL  Sleep:  Number of Hours: 6   Treatment Plan Summary:Reviewed current treatment plan, Will continue the following plan without adjustment at this time. Daily contact with patient to assess and evaluate symptoms and progress in treatment  Major depressive disorder, recurrent, severe without psychosis:  Medication: To reduce current symptoms to base line and improve the patient's overall level of functioning will continue Medication management as follows: -Continue Prozac 20 mg daily for depression  Alcohol dependence: -Continue ATivan protocol -Continue gabapentin 300 mg TID for withdrawal symptoms and  anxiety  Anxiety --Continue gabapentin 300 mg TID for anxiety  and withdrawal symptoms -Continue hydroxyzine 25 mg every 6 hours PRN anxiety  Insomnia: -Continue Trazodone 100 mg at bedtime sleep  Plan: 1. Patient admitted to the adult unit at [facility] under the service of Dr. Mallie Darting 2. Routine labs: UDS negative for drugs, 298 alcohol. U/A:  Hazy appearance with ketones 5, above the norm of 0.  TSH, glucose, CK, acetaminophen, and salicylate levels are WDL.  CBC & dDiff WNL except HCT slight decrease at 38.9.  Met B WDL except  Low sodium 134, chloride 100, CO2 21, calcium 8.2, and total protein of 6.1.  AST elevated at 71 3. Will maintain Q 15 minutes observation for safety.Estimated LOS: 5-7 days  4. During this hospitalization the patient will receive a psychosocialassessment. 5. Patient will participate ingroup, milieu, and family therapy.Psychotherapy: Social and Airline pilot, anti-bullying, learning based strategies, cognitive behavioral, and family object relations individuation separation intervention psychotherapies can be considered. 6. Will continue to monitor patient's mood and behavior. 7. Social Work willschedule a Family meeting to obtain collateral information and discuss discharge and follow up plan.Discharge concerns will also be addressed: Safety, stabilization, and access to medication 8. Discharge date: 07/03/2017.   Waylan Boga, NP 07/02/2017, 9:46 AM   ..Agree with NP Progress Note

## 2017-07-02 NOTE — Progress Notes (Signed)
Pt gave CSW permission to leave message at [facility] halfway house regarding his referral and to verify if the [facility] team had referred there. [phone] option 4). Pt states that he had a bed available at at long-term treatment facility in [place] but did not know for sure if this was [facility]. CSW left message for Reggie at [facility] inquiring about above and requested call back at his earliest convenience. CSW and pt have been leaving messages for pt's case manager through [facility] at the [employer].   Trula Slade, MSW, LCSW Clinical Social Worker 07/02/2017 11:56 AM

## 2017-07-02 NOTE — Progress Notes (Signed)
D: Patient observed up and visible in the milieu. Cautious no approach. Apathetic in presentation.  Patient with minimal eye contact, affect flat, mood depressed. Some anxiety noted. Per self inventory and discussions with writer, rates depression at a 6/10, hopelessness at a 6/10 and anxiety at a 6/10. Rates sleep as fair, appetite as fair, energy as normal and concentration as good.  States goal for today is to "get through it, talk to doctor." Denies pain however some withdrawal noted. Slight tremors and reports difficulty sleeping which he attributes to withdrawal.   A: Medicated per orders, no prns requested or required. Level III obs in place for safety. Emotional support offered and self inventory reviewed. Encouraged completion of Suicide Safety Plan and programming participation. Discussed POC with MD, SW.  Fall prevention plan in place and reviewed with patient as pt is a high fall risk due to seizure disorder.   R: Patient verbalizes understanding of POC, falls prevention education. Patient denies SI/HI/AVH and remains safe on level III obs. Will continue to monitor closely and make verbal contact frequently. Currently sitting in dayroom.

## 2017-07-02 NOTE — [facility] Group Notes (Signed)
[facility] Group Therapy 07/02/2017 10:30AM  Type of Therapy: Mental [facility] Presentation  Participation Level: Active  Participation Quality: Attentive  Affect: Appropriate  Cognitive: Oriented  Insight: Developing/Improving  Engagement in Therapy: Engaged  Modes of Intervention: Discussion, Education and Socialization  Summary of Progress/Problems: CSW provided pt and group with MHAG information and other community resources The pt processed ways by which to utilize these services. CSW provided handouts and educational information pertaining to groups and services offered by the MHAG. Pt was engaged in presentation and was receptive to resources provided.   Soleil Mas N Smart, LCSW 07/02/2017 11:58 AM  

## 2017-07-02 NOTE — Plan of Care (Signed)
Patient verbalizes understanding of information, education provided.  Patient has not engaged in self harm, denies thoughts to do so. 

## 2017-07-02 NOTE — Progress Notes (Signed)
Pt observed in the dayroom not interaction. Pt was obviously irritable during assessment; "I just want my medication and the leave me a "F" alone. Pt denied SI/HI and pain. Support, encouragement, and safe environment provided. Pt was med compliant.

## 2017-07-02 NOTE — [facility] Group Notes (Signed)
Adult Psychoeducational Group Note  Date:  07/02/2017 Time:  1600  Group Topic/Focus: Mindfulness, Support Systems and Recovery Recovery Goals:   The focus of this group is to identify appropriate goals for recovery and establish a plan to achieve them.  Participation Level:  Minimal  Participation Quality:  Fair  Affect:  Depressed  Cognitive:  Alert and Oriented  Insight: Improving  Engagement in Group:  Engaged and Supportive  Modes of Intervention:  Discussion, Education, Socialization and Support  Additional Comments:  Patient was engaged in group and supportive of peers. Patient was hesitant to share but did respond when prompted by staff. Patient reports recovery to him means staying sober and going to treatment.  Rodney Watson 07/02/2017, 6:18 PM

## 2017-07-03 MED ORDER — FLUOXETINE HCL 20 MG PO CAPS: 20.0000 mg | ORAL_CAPSULE | Freq: Every day | ORAL | 0 refills | Status: DC

## 2017-07-03 MED ORDER — PHENYTOIN SODIUM EXTENDED 100 MG PO CAPS: 100.0000 mg | ORAL_CAPSULE | Freq: Three times a day (TID) | ORAL | 0 refills | Status: DC

## 2017-07-03 MED ORDER — GABAPENTIN 300 MG PO CAPS: 300.0000 mg | ORAL_CAPSULE | Freq: Three times a day (TID) | ORAL | 0 refills | Status: DC

## 2017-07-03 MED ORDER — TRAZODONE HCL 100 MG PO TABS: 100.0000 mg | ORAL_TABLET | Freq: Every evening | ORAL | 0 refills | Status: DC | PRN

## 2017-07-03 MED ORDER — SULFAMETHOXAZOLE-TRIMETHOPRIM 800-160 MG PO TABS: 1.0000 | ORAL_TABLET | Freq: Two times a day (BID) | ORAL | 0 refills | Status: DC

## 2017-07-03 NOTE — Discharge Summary (Addendum)
Physician Discharge Summary Note  Patient:  Rodney Watson is an 52 y.o., male MRN:  [id number] DOB:  [birth year] Patient phone:  [phone] (home)  Patient address:   [place] [address],  Total Time spent with patient: 45 minutes  Date of Admission:  06/29/2017 Date of Discharge: 07/03/2017  Reason for Admission:  Suicide threat  Principal Problem: MDD (major depressive disorder), recurrent severe, without psychosis Rodney Watson) Discharge Diagnoses: Patient Active Problem List   Diagnosis Date Noted  . MDD (major depressive disorder), recurrent severe, without psychosis ([place]) [F33.2] 06/29/2017    Priority: High  . Alcohol abuse with alcohol-induced mood disorder ([place]) [F10.14] 04/10/2017    Priority: High  . MDD (major depressive disorder), recurrent episode, severe ([place]) [F33.2] 04/13/2017  . Alcohol abuse [F10.10] 04/01/2017    Past Psychiatric History: depression, alcohol dependence  Past Medical History:  Past Medical History:  Diagnosis Date  . Alcohol abuse   . COPD (chronic obstructive pulmonary disease) ([place])   . Depression   . ETOH abuse   . Seizures ([place])    History reviewed. No pertinent surgical history. Family History: History reviewed. No pertinent family history. Family Psychiatric  History: none Social History:  Social History   Substance and Sexual Activity  Alcohol Use Yes   Comment: 5th daily     Social History   Substance and Sexual Activity  Drug Use Yes  . Types: Marijuana   Comment: 1-2 joints/week    Social History   Socioeconomic History  . Marital status: Single    Spouse name: Not on file  . Number of children: Not on file  . Years of education: Not on file  . Highest education level: Not on file  Occupational History  . Not on file  Social Needs  . Financial resource strain: Not on file  . Food insecurity:    Worry: Not on file    Inability: Not on file  . Transportation needs:    Medical: Not on file     Non-medical: Not on file  Tobacco Use  . Smoking status: Current Every Day Smoker    Packs/day: 1.50    Years: 39.00    Pack years: 58.50    Types: Cigarettes  . Smokeless tobacco: Current User    Types: Chew  Substance and Sexual Activity  . Alcohol use: Yes    Comment: 5th daily  . Drug use: Yes    Types: Marijuana    Comment: 1-2 joints/week  . Sexual activity: Never  Lifestyle  . Physical activity:    Days per week: Not on file    Minutes per session: Not on file  . Stress: Not on file  Relationships  . Social connections:    Talks on phone: Not on file    Gets together: Not on file    Attends religious service: Not on file    Active member of club or organization: Not on file    Attends meetings of clubs or organizations: Not on file    Relationship status: Not on file  Other Topics Concern  . Not on file  Social History Narrative   ** Merged History [facility] Course:  On admission 06/30/2017:  52 year old male admitted from [facility] for SI with a plan to step in front of a train. Pt reports that he is now seeking alcohol detoxification. Pt is currently homeless and has a hx of of MDD, seizures, COPD and  multiple admissions for alcohol abuse. Pt was calm and cooperative during admission interview- answering all questions appropriately.  On evaluation: Patient seen resting in bed, reports " I have answer these questions already, I am her for a alcohol problem" patient presents guarded and irritable during this assessment. Chart reviewed previous admissions noted for depression, suicidal ideation and ethanol abuse. Patient was started on a alcohol detox protocol. Reports depression and suicidal ideation with head nodding ( up and down). Support, encouragement and reassurance as provided.   Medications:  Started Ativan alcohol detox protocol, started Prozac 20 mg daily for depression, and Trazodone 100 mg at bedtime for sleep PRN  07/01/2017:  "I feel like  crap."  Complaining of having shakes and some dry heaves.  6/10 depression and anxiety, sleep and appetite are "fair to poor".  During this evaluation, patient is alert and oriented x4 and cooperative.Patient isshowing slight improvement in moodand with a6/10 depressionand anxietywith no suicidal ideations. Hisaffectshowsslight improvement noted compared to hispresentation on admission.Sleep and appetite are "fair to poor". He complains of having some dry heaves and tremors, gabapentin added to his detox protocol. He is resting in his bed and cannot attend groups at this time due to not feeling well. Fluids encouraged and rest.  Rodney Watson any SI, HI or AVH and does not appear to be responding to internal stimuli. He has remained free fromself harmingbehaviors on the unit.  At this time, he is contracting for safety on the unit.  Medications:  Started gabapentin 300 mg TID for alcohol withdrawal symptoms  07/02/2017:  "I feel alright."  Denies withdrawal symptoms includes the shakes and dry heaves.  6/10 depression and 4/10 anxiety, sleep and appetite are "fair".  He is looking forward to going to long-term treatment/rehab tomorrow.  During this evaluation, patient is alert and oriented x4 and cooperative.Patient isshowing improvement in moodand with a6/10 depressionand 2/10 anxiety with no suicidal ideations. Hisaffectshowsimprovement noted compared to hispresentation on admission.Sleep and appetite are "fair", an improvement. He denies any withdrawal symptoms and is up and out of bed this morning.  Rodney Watson plans on attending groups today since he is feeling better.  Rodney Watson any SI, HI or AVH and does not appear to be responding to internal stimuli. He has remained free fromself harmingbehaviors on the unit.  At this time, he is contracting for safety on the unit.  Medications:  No changes made  07/03/2017:Patient has met maximum benefit of  hospitalization. Denies suicidal/homicidal ideations, hallucinations, and substance abuse issues. Discharge instructions provided along with Rx, 24 hour crisis numbers, and a follow-up appointment. Stable for discharge.  Physical Findings: AIMS: Facial and Oral Movements Muscles of Facial Expression: None, normal Lips and Perioral Area: None, normal Jaw: None, normal Tongue: None, normal,Extremity Movements Upper (arms, wrists, hands, fingers): None, normal Lower (legs, knees, ankles, toes): None, normal, Trunk Movements Neck, shoulders, hips: None, normal, Overall Severity Severity of abnormal movements (highest score from questions above): None, normal Incapacitation due to abnormal movements: None, normal Patient's awareness of abnormal movements (rate only patient's report): No Awareness, Dental Status Current problems with teeth and/or dentures?: No Does patient usually wear dentures?: No  CIWA:  CIWA-Ar Total: 1 COWS:     Musculoskeletal: Strength & Muscle Tone: within normal limits no restlessness, no significant distal tremors at this time Gait & Station: normal Patient leans: N/A  Psychiatric Specialty Exam: ROS denies chest pain, no shortness of breath, no vomiting   Blood pressure 103/81, pulse 86, temperature 98.4 F (36.9 C),  temperature source Oral, resp. rate 20, height _0  (1.676 m), weight 59 kg (130 lb), SpO2 99 %.Body mass index is 20.98 kg/m.  General Appearance: improving grooming   Eye Contact::  Good  Speech:  Normal Rate409  Volume:  Normal  Mood:  improving mood, states he feels better than he did prior to admission  Affect:  appropriate, more reactive   Thought Process:  Linear and Descriptions of Associations: Intact  Orientation:  Other:  fully alert and attentive  Thought Content:  denies hallucinations, no delusions, not internally preoccupied  Suicidal Thoughts:  No denies suicidal or self injurious ideations, denies homicidal or violent  ideations  Homicidal Thoughts:  No  Memory:  recent and remote grossly intact   Judgement:  Other:  improving   Insight:  improving   Psychomotor Activity:  Normal  Concentration:  Good  Recall:  Good  Fund of Knowledge:Good  Language: Good  Akathisia:  Negative  Handed:  Right  AIMS (if indicated):     Assets:  Communication Skills Desire for Improvement Resilience  Sleep:  Number of Hours: 5.25  Cognition: WNL  ADL's:  Intact      Has this patient used any form of tobacco in the last 30 days? (Cigarettes, Smokeless Tobacco, Cigars, and/or Pipes) Yes, Yes, A prescription for an FDA-approved tobacco cessation medication was offered at discharge and the patient refused  Blood Alcohol level:  Lab Results  Component Value Date   ETH 375 ([place]) 06/28/2017   ETH 371 ([place]) 53/29/9242    Metabolic Disorder Labs:  No results found for: HGBA1C, MPG No results found for: PROLACTIN No results found for: CHOL, TRIG, HDL, CHOLHDL, VLDL, LDLCALC  See Psychiatric Specialty Exam and Suicide Risk Assessment completed by Attending Physician prior to discharge.  Discharge destination:  Home  Is patient on multiple antipsychotic therapies at discharge:  No   Has Patient had three or more failed trials of antipsychotic monotherapy by history:  No  Recommended Plan for Multiple Antipsychotic Therapies: NA  Discharge Instructions    Diet - low sodium heart healthy   Complete by:  As directed    Discharge instructions   Complete by:  As directed    Discharge home   Increase activity slowly   Complete by:  As directed      Allergies as of 07/03/2017      Reactions   Chocolate Hives   Vicodin [hydrocodone-acetaminophen] Nausea And Vomiting      Medication List    STOP taking these medications   FLUoxetine 20 MG capsule Commonly known as:  PROZAC   hydrOXYzine 25 MG tablet Commonly known as:  ATARAX/VISTARIL   mirtazapine 15 MG tablet Commonly known as:  REMERON    QUEtiapine 100 MG tablet Commonly known as:  SEROQUEL     TAKE these medications     Indication  gabapentin 300 MG capsule Commonly known as:  NEURONTIN Take 1 capsule (300 mg total) by mouth 3 (three) times daily.  Indication:  Abuse or Misuse of Alcohol, Alcohol Withdrawal Syndrome, Agitation   lisinopril 10 MG tablet Commonly known as:  PRINIVIL,ZESTRIL Take 1 tablet (10 mg total) by mouth daily. For high blood pressure  Indication:  High Blood Pressure Disorder   phenytoin 100 MG ER capsule Commonly known as:  DILANTIN Take 1 capsule (100 mg total) by mouth 3 (three) times daily.  Indication:  Seizure   sulfamethoxazole-trimethoprim 800-160 MG tablet Commonly known as:  BACTRIM DS,SEPTRA DS Take 1  tablet by mouth every 12 (twelve) hours.  Indication:  infection   traZODone 100 MG tablet Commonly known as:  DESYREL Take 1 tablet (100 mg total) by mouth at bedtime and may repeat dose one time if needed.  Indication:  Trouble Sleeping      Follow-up Information    [facility] Follow up on 07/09/2017.   Specialty:  Behavioral Health Why:  Hospital follow-up on Tuesday, 5/21 at 8:00AM. Please bring: photo ID and medicaid card if you have it. Thank you.  Contact information: [address] [place] [address] [phone]        [facility] Follow up.   Specialty:  [facility] Watson information: [place] [phone]          Follow-up recommendations:  Activity:  as tolerated Diet:  heart healthy diet  Comments:  Encouraged to follow-up with outpatient appointment this week.  SignedWaylan Boga, NP 07/03/2017, 9:57 AM   Patient seen, Suicide Assessment Completed.  Disposition Plan Reviewed

## 2017-07-03 NOTE — Progress Notes (Signed)
Recreation Therapy Notes  Date: 5.15.19 Time: 0930 Location: [address] Dayroom  Group Topic: Stress Management  Goal Area(s) Addresses:  Patient will verbalize importance of using healthy stress management.  Patient will identify positive emotions associated with healthy stress management.   Intervention: Stress Management  Activity :  Meditation.  LRT played a meditation on being resilient in the face of adversity.  Patients were to follow along as the meditation played.   Education:  Stress Management, Discharge Planning.   Education Outcome: Acknowledges edcuation/In group clarification offered/Needs additional education  Clinical Observations/Feedback: Pt did not attend group.    Esther Bradstreet, LRT/CTRS         Layne Lebon A 07/03/2017 11:15 AM 

## 2017-07-03 NOTE — Progress Notes (Signed)
  [facility] Adult Case Management Discharge Plan :  Will you be returning to the same living situation after discharge:  Yes,  [name]. At discharge, do you have transportation home?: Yes,  bus passes provided. pt encouraged to go directly to [facility] at discharge to meet with his path team Do you have the ability to pay for your medications: Yes,  mental health  Release of information consent forms completed and submitted to medical records by CSW.  Patient to Follow up at: Follow-up Information    [facility] Follow up on 07/09/2017.   Specialty:  Behavioral Health Why:  Hospital follow-up on Tuesday, 5/21 at 8:00AM. Please bring: photo ID and medicaid card if you have it. Thank you.  Contact information: [address] [place] [address] [phone]           Next level of care provider has access to [facility] Link:no  Safety Planning and Suicide Prevention discussed: Yes,  SPE completed with pt; pt declined to consent to collateral contact. SPI pamphlet and Mobile Crisis information provided to pt.   Has patient been referred to the Quitline?: Patient refused referral  Patient has been referred for addiction treatment: Yes  [employer], LCSW 07/03/2017, 10:01 AM

## 2017-07-03 NOTE — Progress Notes (Signed)
D: Pt presented to med window. Stated he was feeling good and it was going to be a great day. Pt contracts for safety. Pt denies any si/hi.  A: pt given support and encouragement. Meds given per protocol and orders. q30m checks implemented and continued. R: patient safe on the unit. Will continue to monitor.

## 2017-07-03 NOTE — Plan of Care (Signed)
Patient verbalizes readiness for discharge. Follow up plan explained, AVS, Transition record and SRA given. Prescriptions and teaching provided. Belongings returned and signed for. Suicide safety plan completed and signed. Patient verbalizes understanding. Patient denies SI/HI and assures this writer he will seek assistance should that change. Patient discharged to lobby.  

## 2017-07-03 NOTE — BHH Suicide Risk Assessment (Signed)
[facility] Discharge Suicide Risk Assessment   Principal Problem: MDD (major depressive disorder), recurrent severe, without psychosis ([facility]) Discharge Diagnoses:  Patient Active Problem List   Diagnosis Date Noted  . MDD (major depressive disorder), recurrent severe, without psychosis ([facility]) [F33.2] 06/29/2017  . MDD (major depressive disorder), recurrent episode, severe ([facility]) [F33.2] 04/13/2017  . Alcohol abuse with alcohol-induced mood disorder ([facility]) [F10.14] 04/10/2017  . Alcohol abuse [F10.10] 04/01/2017    Total Time spent with patient: 30 minutes  Musculoskeletal: Strength & Muscle Tone: within normal limits no restlessness, no significant distal tremors at this time Gait & Station: normal Patient leans: N/A  Psychiatric Specialty Exam: ROS denies chest pain, no shortness of breath, no vomiting   Blood pressure 103/81, pulse 86, temperature 98.4 F (36.9 C), temperature source Oral, resp. rate 20, height  (1.676 m), weight 59 kg (130 lb), SpO2 99 %.Body mass index is 20.98 kg/m.  General Appearance: improving grooming   Eye Contact::  Good  Speech:  Normal Rate409  Volume:  Normal  Mood:  improving mood, states he feels better than he did prior to admission  Affect:  appropriate, more reactive   Thought Process:  Linear and Descriptions of Associations: Intact  Orientation:  Other:  fully alert and attentive  Thought Content:  denies hallucinations, no delusions, not internally preoccupied  Suicidal Thoughts:  No denies suicidal or self injurious ideations, denies homicidal or violent ideations  Homicidal Thoughts:  No  Memory:  recent and remote grossly intact   Judgement:  Other:  improving   Insight:  improving   Psychomotor Activity:  Normal  Concentration:  Good  Recall:  Good  Fund of Knowledge:Good  Language: Good  Akathisia:  Negative  Handed:  Right  AIMS (if indicated):     Assets:  Communication Skills Desire for Improvement Resilience  Sleep:  Number  of Hours: 5.25  Cognition: WNL  ADL's:  Intact   Mental Status Per Nursing Assessment::   On Admission:     Demographic Factors:  52 year old male, homeless , unemployed   Loss Factors: Homelessness, limited support network, ETOH Use Disorder   Historical Factors: Alcohol Use Disorder, Depression  Risk Reduction Factors:   Positive coping skills or problem solving skills  Continued Clinical Symptoms:  At this time patient presents alert, attentive, well groomed,  with improved mood, states he is feeling better, affect is appropriate, remains anxious, no thought disorder , denies suicidal or self injurious ideations, denies homicidal or violent ideations, no hallucinations,no delusions, not internally preoccupied. Denies medication side effects. Does not present with ongoing alcohol WDL symptoms and vitals are stable. Behavior on unit in good control. Polite, cooperative on approach.  Cognitive Features That Contribute To Risk:  No gross cognitive deficits noted upon discharge. Is alert , attentive, and oriented x 3   Suicide Risk:  Mild:  Suicidal ideation of limited frequency, intensity, duration, and specificity.  There are no identifiable plans, no associated intent, mild dysphoria and related symptoms, good self-control (both objective and subjective assessment), few other risk factors, and identifiable protective factors, including available and accessible social support.  Follow-up Information    [facility] Follow up on 07/09/2017.   Specialty:  Behavioral Health Why:  Hospital follow-up on Tuesday, 5/21 at 8:00AM. Please bring: photo ID and medicaid card if you have it. Thank you.  Contact [address] [place] [address] [phone]        [facility] Follow up.  Specialty:  [facility] information: [address] [place] [address] [phone]          Plan Of Care/Follow-up  recommendations:  Activity:  as tolerated  Diet:  regular Tests:  NA Other:  See below Patient plans to go to shelter until he gets into residential  Program. Follow up as above. Reports motivation in sobriety, reports plans to avoid people he used to consume alcohol with to minimize triggers. Craige Cotta, MD 07/03/2017, 9:57 AM

## 2017-07-03 NOTE — Progress Notes (Signed)
Patient verbalizes readiness for discharge. Follow up plan explained, AVS, Transition record and SRA given. Prescriptions and teaching provided. Belongings returned and signed for. Suicide safety plan completed and signed. Patient verbalizes understanding. Patient denies SI/HI and assures this writer he will seek assistance should that change. Patient discharged to lobby.  

## 2017-07-03 NOTE — Progress Notes (Signed)
Pt reports he is feeling better this evening.  He attended evening AA group.  He denies SI/HI/AVH.  He denies any withdrawal symptoms at this time.  He states he may be discharging tomorrow to a long term rehab program.  He has been pleasant and cooperative with staff.  He makes his needs known to staff.  Support and encouragement offered.  Discharge plans are in process.  Safety maintained with q15 minute checks.

## 2017-07-03 NOTE — BHH Group Notes (Signed)
Adult Psychoeducational Group Note  Date:  07/03/2017 Time:  9:56 AM  Group Topic/Focus:  Goals Group:   The focus of this group is to help patients establish daily goals to achieve during treatment and discuss how the patient can incorporate goal setting into their daily lives to aide in recovery.  Participation Level:  Active  Participation Quality:  Appropriate  Affect:  Appropriate  Cognitive:  Alert  Insight: Appropriate  Engagement in Group:  Engaged  Modes of Intervention:  Orientation  Additional Comments:   Pt attended and participated in orientation/goals group. Pt goal for today is to stay calm and discharge.   Dellia Nims 07/03/2017, 9:56 AM

## 2017-07-17 ENCOUNTER — Emergency Department (HOSPITAL_COMMUNITY): Payer: Medicaid Other

## 2017-07-17 ENCOUNTER — Inpatient Hospital Stay (HOSPITAL_COMMUNITY)
Admission: EM | Admit: 2017-07-17 | Discharge: 2017-07-19 | DRG: 894 | Discharge status: Against Medical Advice | Payer: Medicaid Other | Attending: Internal Medicine | Admitting: Internal Medicine

## 2017-07-17 ENCOUNTER — Other Ambulatory Visit: Payer: Self-pay

## 2017-07-17 DIAGNOSIS — F10929 Alcohol use, unspecified with intoxication, unspecified: Secondary | ICD-10-CM

## 2017-07-17 DIAGNOSIS — Z5900 Homelessness unspecified: Secondary | ICD-10-CM

## 2017-07-17 DIAGNOSIS — F1010 Alcohol abuse, uncomplicated: Secondary | ICD-10-CM | POA: Diagnosis present

## 2017-07-17 DIAGNOSIS — I10 Essential (primary) hypertension: Secondary | ICD-10-CM | POA: Diagnosis present

## 2017-07-17 DIAGNOSIS — G40909 Epilepsy, unspecified, not intractable, without status epilepticus: Secondary | ICD-10-CM | POA: Diagnosis present

## 2017-07-17 DIAGNOSIS — J449 Chronic obstructive pulmonary disease, unspecified: Secondary | ICD-10-CM | POA: Diagnosis present

## 2017-07-17 DIAGNOSIS — F10939 Alcohol use, unspecified with withdrawal, unspecified: Secondary | ICD-10-CM

## 2017-07-17 DIAGNOSIS — R569 Unspecified convulsions: Secondary | ICD-10-CM

## 2017-07-17 DIAGNOSIS — F17210 Nicotine dependence, cigarettes, uncomplicated: Secondary | ICD-10-CM | POA: Diagnosis present

## 2017-07-17 DIAGNOSIS — F419 Anxiety disorder, unspecified: Secondary | ICD-10-CM | POA: Diagnosis present

## 2017-07-17 DIAGNOSIS — F10239 Alcohol dependence with withdrawal, unspecified: Principal | ICD-10-CM | POA: Diagnosis present

## 2017-07-17 DIAGNOSIS — S0181XA Laceration without foreign body of other part of head, initial encounter: Secondary | ICD-10-CM | POA: Diagnosis present

## 2017-07-17 DIAGNOSIS — W19XXXA Unspecified fall, initial encounter: Secondary | ICD-10-CM | POA: Diagnosis present

## 2017-07-17 DIAGNOSIS — K769 Liver disease, unspecified: Secondary | ICD-10-CM | POA: Diagnosis present

## 2017-07-17 DIAGNOSIS — F10220 Alcohol dependence with intoxication, uncomplicated: Secondary | ICD-10-CM | POA: Diagnosis present

## 2017-07-17 DIAGNOSIS — Y908 Blood alcohol level of 240 mg/100 ml or more: Secondary | ICD-10-CM | POA: Diagnosis present

## 2017-07-17 DIAGNOSIS — T420X6A Underdosing of hydantoin derivatives, initial encounter: Secondary | ICD-10-CM | POA: Diagnosis present

## 2017-07-17 DIAGNOSIS — F329 Major depressive disorder, single episode, unspecified: Secondary | ICD-10-CM | POA: Diagnosis present

## 2017-07-17 DIAGNOSIS — Z811 Family history of alcohol abuse and dependence: Secondary | ICD-10-CM

## 2017-07-17 DIAGNOSIS — Z91138 Patient's unintentional underdosing of medication regimen for other reason: Secondary | ICD-10-CM

## 2017-07-17 LAB — COMPREHENSIVE METABOLIC PANEL
ALK PHOS: 77 U/L (ref 38–126)
ALT: 42 U/L (ref 17–63)
AST: 69 U/L — ABNORMAL HIGH (ref 15–41)
Albumin: 4 g/dL (ref 3.5–5.0)
Anion gap: 14 (ref 5–15)
BILIRUBIN TOTAL: 0.3 mg/dL (ref 0.3–1.2)
BUN: 10 mg/dL (ref 6–20)
CALCIUM: 7.9 mg/dL — AB (ref 8.9–10.3)
CO2: 22 mmol/L (ref 22–32)
CREATININE: 0.74 mg/dL (ref 0.61–1.24)
Chloride: 108 mmol/L (ref 101–111)
GFR calc non Af Amer: >60 mL/min (ref >60)
Glucose, Bld: 146 mg/dL — ABNORMAL HIGH (ref 65–99)
Potassium: 3.6 mmol/L (ref 3.5–5.1)
Sodium: 144 mmol/L (ref 135–145)
TOTAL PROTEIN: 6.9 g/dL (ref 6.5–8.1)

## 2017-07-17 LAB — CBC WITH DIFFERENTIAL/PLATELET
Basophils Absolute: 0.1 10*3/uL (ref 0.0–0.1)
Basophils Relative: 2 %
EOS PCT: 2 %
Eosinophils Absolute: 0.1 10*3/uL (ref 0.0–0.7)
HCT: 43.3 % (ref 39.0–52.0)
HEMOGLOBIN: 14.6 g/dL (ref 13.0–17.0)
LYMPHS ABS: 2.1 10*3/uL (ref 0.7–4.0)
LYMPHS PCT: 46 %
MCH: 31.7 pg (ref 26.0–34.0)
MCHC: 33.7 g/dL (ref 30.0–36.0)
MCV: 93.9 fL (ref 78.0–100.0)
Monocytes Absolute: 0.3 10*3/uL (ref 0.1–1.0)
Monocytes Relative: 6 %
Neutro Abs: 2.1 10*3/uL (ref 1.7–7.7)
Neutrophils Relative %: 46 %
PLATELETS: 163 10*3/uL (ref 150–400)
RBC: 4.61 MIL/uL (ref 4.22–5.81)
RDW: 16.9 % — ABNORMAL HIGH (ref 11.5–15.5)
WBC: 4.6 10*3/uL (ref 4.0–10.5)

## 2017-07-17 LAB — ETHANOL: ALCOHOL ETHYL (B): 334 mg/dL — AB (ref <10)

## 2017-07-17 LAB — PHENYTOIN LEVEL, TOTAL: Phenytoin Lvl: <2.5 ug/mL — ABNORMAL LOW (ref 10.0–20.0)

## 2017-07-17 LAB — CBG MONITORING, ED: Glucose-Capillary: 82 mg/dL (ref 65–99)

## 2017-07-17 MED ORDER — LORAZEPAM 2 MG/ML IJ SOLN
2.0000 mg | Freq: Once | INTRAMUSCULAR | Status: AC
  Administered 2017-07-17: 2 mg via INTRAVENOUS
  Filled 2017-07-17: qty 1

## 2017-07-17 MED ORDER — ADULT MULTIVITAMIN W/MINERALS CH: 1.0000 | ORAL_TABLET | Freq: Every day | ORAL | Status: DC

## 2017-07-17 MED ORDER — SODIUM CHLORIDE 0.9 % IV SOLN
1000.0000 mg | Freq: Once | INTRAVENOUS | Status: AC
  Administered 2017-07-17: 1000 mg via INTRAVENOUS
  Filled 2017-07-17: qty 20

## 2017-07-17 MED ORDER — HALOPERIDOL LACTATE 5 MG/ML IJ SOLN
5.0000 mg | Freq: Once | INTRAMUSCULAR | Status: AC
  Administered 2017-07-17: 5 mg via INTRAVENOUS
  Filled 2017-07-17: qty 1

## 2017-07-17 MED ORDER — PHENYTOIN SODIUM EXTENDED 100 MG PO CAPS: 100.0000 mg | ORAL_CAPSULE | Freq: Three times a day (TID) | ORAL | 3 refills | Status: DC

## 2017-07-17 MED ORDER — LORAZEPAM 2 MG/ML IJ SOLN
1.0000 mg | Freq: Four times a day (QID) | INTRAMUSCULAR | Status: DC | PRN
  Administered 2017-07-18: 1 mg via INTRAVENOUS
  Filled 2017-07-17: qty 1

## 2017-07-17 MED ORDER — FOLIC ACID 1 MG PO TABS: 1.0000 mg | ORAL_TABLET | Freq: Every day | ORAL | Status: DC

## 2017-07-17 MED ORDER — LORAZEPAM 1 MG PO TABS
1.0000 mg | ORAL_TABLET | Freq: Four times a day (QID) | ORAL | Status: DC | PRN
  Administered 2017-07-19: 1 mg via ORAL
  Filled 2017-07-17: qty 1

## 2017-07-17 MED ORDER — LORAZEPAM 2 MG/ML IJ SOLN: 1.0000 mg | Freq: Once | INTRAMUSCULAR | Status: DC

## 2017-07-17 MED ORDER — LORAZEPAM 1 MG PO TABS: 0.0000 mg | ORAL_TABLET | Freq: Two times a day (BID) | ORAL | Status: DC

## 2017-07-17 MED ORDER — SODIUM CHLORIDE 0.9 % IV BOLUS
1000.0000 mL | Freq: Once | INTRAVENOUS | Status: AC
  Administered 2017-07-17: 1000 mL via INTRAVENOUS

## 2017-07-17 MED ORDER — VITAMIN B-1 100 MG PO TABS: 100.0000 mg | ORAL_TABLET | Freq: Every day | ORAL | Status: DC

## 2017-07-17 MED ORDER — THIAMINE HCL 100 MG/ML IJ SOLN: 100.0000 mg | Freq: Every day | INTRAMUSCULAR | Status: DC

## 2017-07-17 MED ORDER — LORAZEPAM 1 MG PO TABS
0.0000 mg | ORAL_TABLET | Freq: Four times a day (QID) | ORAL | Status: DC
  Administered 2017-07-18 (×2): 2 mg via ORAL
  Administered 2017-07-18: 4 mg via ORAL
  Administered 2017-07-18 – 2017-07-19 (×2): 2 mg via ORAL
  Filled 2017-07-17 (×4): qty 2
  Filled 2017-07-17: qty 4
  Filled 2017-07-17: qty 2
  Filled 2017-07-17: qty 3

## 2017-07-17 NOTE — ED Triage Notes (Signed)
Pt to ed via [facility] with complaints of a witnessed seizure, unknown if fallen. Pt was found on ground by EMS, was able to ambulate to truck, alert and no complaints of pain. Pt states that he ran out of his Dilantin 2 weeks ago and has 2 seizures today.  Pt is A&O x4 and does have EOTH on board.  Pt states his last drink was 5 hours ago.

## 2017-07-17 NOTE — ED Provider Notes (Signed)
[facility]-EMERGENCY DEPT Provider Note   CSN: [id number] Arrival date & time: 07/17/17  1306     History   Chief Complaint Chief Complaint  Patient presents with  . Seizures  . Alcohol Intoxication    HPI Rodney Watson is a 52 y.o. male.  HPI   He presents for evaluation of seizure, and right hip pain.  He states he is homeless [place]."  Reports drinking alcohol today.  He states that since he had the seizure he has pain in his right hip.  He denies recent fever, chills, nausea, vomiting, weakness or dizziness.  He is out of his Dilantin, for the last 2 weeks.  He denies any recent seizures.  There are no other known modifying factors.  Past Medical History:  Diagnosis Date  . Alcohol abuse   . COPD (chronic obstructive pulmonary disease) (HCC)   . Depression   . ETOH abuse   . Seizures [facility])     Patient Active Problem List   Diagnosis Date Noted  . 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 MDD (major depressive disorder), recurrent episode, severe (HCC) 04/13/2017  . Alcohol abuse with alcohol-induced mood disorder (HCC) 04/10/2017  . Alcohol abuse 04/01/2017    No past surgical history on file.      Home Medications    Prior to Admission medications   Medication Sig Start Date End Date Taking? Authorizing Provider  FLUoxetine (PROZAC) 20 MG capsule Take 1 capsule (20 mg total) by mouth daily. 07/03/17  Yes Charm Rings, NP  gabapentin (NEURONTIN) 300 MG capsule Take 1 capsule (300 mg total) by mouth 3 (three) times daily. 07/03/17  Yes Charm Rings, NP  lisinopril (PRINIVIL,ZESTRIL) 10 MG tablet Take 1 tablet (10 mg total) by mouth daily. For high blood pressure 04/17/17  Yes Nwoko, Nicole Kindred I, NP  phenytoin (DILANTIN) 100 MG ER capsule Take 1 capsule (100 mg total) by mouth 3 (three) times daily. 07/03/17  Yes Charm Rings, NP  traZODone (DESYREL) 100 MG tablet Take 1  tablet (100 mg total) by mouth at bedtime and may repeat dose one time if needed. 07/03/17  Yes Charm Rings, NP  sulfamethoxazole-trimethoprim (BACTRIM DS,SEPTRA DS) 800-160 MG tablet Take 1 tablet by mouth every 12 (twelve) hours. Patient not taking: Reported on 07/17/2017 07/03/17   Charm Rings, NP    Family History No family history on file.  Social History Social History   Tobacco Use  . Smoking status: Current Every Day Smoker    Packs/day: 1.50    Years: 39.00    Pack years: 58.50    Types: Cigarettes  . Smokeless tobacco: Current User    Types: Chew  Substance Use Topics  . Alcohol use: Yes    Comment: 5th daily  . Drug use: Yes    Types: Marijuana    Comment: 1-2 joints/week     Allergies   Chocolate and Vicodin [hydrocodone-acetaminophen]   Review of Systems Review of Systems  All other systems reviewed and are negative.    Physical Exam Updated Vital Signs BP 130/86 (BP Location: Right Arm)   Pulse 93   Temp 98 F (36.7 C) (Oral)   Resp 14   Ht  (1.727 m)   Wt 59 kg (130 lb)   SpO2 96%   BMI 19.77 kg/m   Physical Exam  Constitutional: He is oriented to person, place, and time. He appears  well-developed. He appears distressed (Uncomfortable).  Disheveled and under nourished  HENT:  Head: Normocephalic and atraumatic.  Right Ear: External ear normal.  Left Ear: External ear normal.  Eyes: Pupils are equal, round, and reactive to light. Conjunctivae and EOM are normal.  Neck: Normal range of motion and phonation normal. Neck supple.  Cardiovascular: Normal rate, regular rhythm and normal heart sounds.  Pulmonary/Chest: Effort normal and breath sounds normal. He exhibits no bony tenderness.  Abdominal: Soft. There is no tenderness.  Musculoskeletal: Normal range of motion. He exhibits no edema or deformity.  Tender right hip and resists movement secondary to pain.  Neurological: He is alert and oriented to person, place, and time. No  cranial nerve deficit or sensory deficit. He exhibits normal muscle tone. Coordination normal.  Mild dysarthria consistent with intoxication.  No aphasia.  Skin: Skin is warm, dry and intact.  Psychiatric: He has a normal mood and affect. His behavior is normal. Judgment and thought content normal.  Nursing note and vitals reviewed.    ED Treatments / Results  Labs (all labs ordered are listed, but only abnormal results are displayed) Labs Reviewed  PHENYTOIN LEVEL, TOTAL - Abnormal; Notable for the following components:      Result Value   Phenytoin Lvl <2.5 (*)    All other components within normal limits  CBG MONITORING, ED    EKG None  Radiology Dg Chest 1 View  Result Date: 07/17/2017 CLINICAL DATA:  witnessed seizure, unknown if fallen. Pt was found on ground by EMS, was able to ambulate to truck, alert and no complaints of pain. Pt states that he ran out of his Dilantin 2 weeks ago and has 2 seizures today. Pt is A&O x4 and does have EOTH on board. Pt states his last drink was 5 hours ago. EXAM: CHEST  1 VIEW COMPARISON:  03/30/2017 FINDINGS: Lungs are clear. Heart size and mediastinal contours are within normal limits. No effusion. Visualized bones unremarkable. IMPRESSION: No acute cardiopulmonary disease. Electronically Signed   By: Corlis Leak M.D.   On: 07/17/2017 15:14   Dg Hip Unilat W Or Wo Pelvis 2-3 Views Right  Result Date: 07/17/2017 CLINICAL DATA:  witnessed seizure, unknown if fallen. Pt was found on ground by EMS, was able to ambulate to truck, alert and no complaints of pain. Pt states that he ran out of his Dilantin 2 weeks ago and has 2 seizures today. Pt is A&O x4 and does have EOTH on board. Pt states his last drink was 5 hours ago. 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 Negative for fracture or dislocation. Pelvis unremarkable. IMPRESSION: 1. No acute findings. 2. Hip DJD, right  worse than left. Electronically Signed   By: Corlis Leak M.D.   On: 07/17/2017 15:15   Dg Femur Min 2 Views Right  Result Date: 07/17/2017 CLINICAL DATA:  pain Per order- pain Pt to ed via [facility] with complaints of a witnessed seizure, unknown if fallen. Pt was found on ground by EMS, was able to ambulate to truck, alert and no complaints of pain. Pt states that he ran out of his Dilant.*comment was truncated* EXAM: RIGHT FEMUR 2 VIEWS COMPARISON:  CT pelvis 03/30/2017 FINDINGS: There is no evidence of fracture or other focal bone lesions. Soft tissues are unremarkable. IMPRESSION: Negative. Electronically Signed   By: Corlis Leak M.D.   On: 07/17/2017 15:19    Procedures  Procedures (including critical care time)  Medications Ordered in ED Medications  phenytoin (DILANTIN) 1,000 mg in sodium chloride 0.9 % 250 mL IVPB (1,000 mg Intravenous New Bag/Given 07/17/17 1519)     Initial Impression / Assessment and Plan / ED Course  I have reviewed the triage vital signs and the nursing notes.  Pertinent labs & imaging results that were available during my care of the patient were reviewed by me and considered in my medical decision making (see chart for details).      Patient Vitals for the past 24 hrs:  BP Temp Temp src Pulse Resp SpO2 Height Weight  07/17/17 1510 130/86 - - 93 14 96 % - -  07/17/17 1400 126/81 - - - 16 - - -  07/17/17 1324 - - - - - -  (1.727 m) 59 kg (130 lb)  07/17/17 1319 (!) 139/98 98 F (36.7 C) Oral 93 18 93 % - -  07/17/17 1317 - - - - - 93 % - -  07/17/17 1312 - - - - - 96 % - -    3:33 PM Reevaluation with update and discussion. After initial assessment and treatment, an updated evaluation reveals he is alert, calm, cooperative and eating lunch.  Findings discussed with patient and all questions were answered. Mancel Bale   Medical Decision Making: Seizure secondary to medication noncompliance, aggravated by alcohol abuse.  Right hip pain secondary to  arthritis without evidence for fracture.  No indication for further evaluation treatment in the ED.  He has been given a loading dose of Dilantin and will be discharged with a prescription which he states that he will fill.  CRITICAL CARE-no Performed by: Mancel Bale   Nursing Notes Reviewed/ Care Coordinated Applicable Imaging Reviewed Interpretation of Laboratory Data incorporated into ED treatment  The patient appears reasonably screened and/or stabilized for discharge and I doubt any other medical condition or other [facility] requiring further screening, evaluation, or treatment in the ED at this time prior to discharge.  Plan: Home Medications-continue usual medications; Home Treatments-avoid alcohol; return here if the recommended treatment, does not improve the symptoms; Recommended follow up- PCP prn     Final Clinical Impressions(s) / ED Diagnoses   Final diagnoses:  Alcoholic intoxication with complication [facility])  Seizure [facility])  Homelessness    ED Discharge Orders    None       Mancel Bale, MD 07/17/17 1536

## 2017-07-17 NOTE — Discharge Instructions (Addendum)
Avoid drinking alcohol.  Start taking the Dilantin prescription tomorrow.  Follow-up with a primary care doctor for checkup as soon as possible to obtain further prescriptions for Dilantin and treatment as needed.  With hip pain which you are having, use Tylenol.

## 2017-07-17 NOTE — ED Notes (Signed)
While being ambulated pt seemed very unsteady on his feet. Pt states "I can't walk on my own." Pt returned to bed. RN made aware

## 2017-07-17 NOTE — ED Notes (Signed)
Bed: WA02 Expected date:  Expected time:  Means of arrival:  Comments: EMS seizure 

## 2017-07-17 NOTE — ED Notes (Signed)
Attempted to walk pt and pt was still unsteady and told this RN that he is unable to walk right now.

## 2017-07-17 NOTE — ED Notes (Signed)
Date and time results received: 07/17/17 1810 (use smartphrase ".now" to insert current time)  Test: ETOH Critical Value: 334  Name of Provider Notified: Dr. Silverio Lay  Orders Received? Or Actions Taken?:

## 2017-07-17 NOTE — ED Notes (Addendum)
Pt was lying in bed, EMT was standing in doorway. Pt stated "I'm leaving" at which time he jumped up and stumbled, grabbing on to the sink adjacent the bed. Pt then tried to walk at which time EMT tried to grab and steady him. Pt jerked away and fell face first, injuring his forehead and nose on the floor. EMT immediately called out for assistance over the radio. Pt was lifted back on the bed, injuries were treated, and pt was given fluids. Pt currently restless, but EMT is at bed side monitoring

## 2017-07-17 NOTE — ED Provider Notes (Signed)
  Physical Exam  BP 112/67   Pulse 92   Temp 98 F (36.7 C) (Oral)   Resp 16   Ht  (1.727 m)   Wt 59 kg (130 lb)   SpO2 97%   BMI 19.77 kg/m   Physical Exam  ED Course/Procedures     Procedures  MDM  Care assumed at 5:30 pm. Patient is a known alcoholic and had possible seizure and was loaded with dilantin. Patient was discharged and was unsteady and walked and fell and had a laceration to his forehead. I was called into the room to evaluate patient. He has 0.5 cm laceration bridge of the nose and bleeding controlled with some pressure. He appears intoxicated and has trouble walking. Will get CT head/neck, ETOH level, labs.    7 pm Labs showed ETOH 330. CT head/neck unremarkable. Laceration dermabonded. He is agitated and required some ativan, haldol. Still intoxicated   10 pm Attempted to ambulate patient. Still unable to walk by himself. Feels shaky but HR in the 70s. Will start on CIWA.   11:46 PM Patient is homeless, still unable to ambulate. Placed on CIWA. Signed out to Dr. Blinda Leatherwood. If he is stable in AM, can be discharged.    LACERATION REPAIR Performed by: Richardean Canal Authorized by: Richardean Canal Consent: Verbal consent obtained. Risks and benefits: risks, benefits and alternatives were discussed Consent given by: patient Patient identity confirmed: provided demographic data Prepped and Draped in normal sterile fashion Wound explored  Laceration Location: bridge of nose  Laceration Length: 0.5 cm  No Foreign Bodies seen or palpated  Anesthesia: local infiltration  Local anesthetic: none  Irrigation method: syringe Amount of cleaning: standard  Skin closure: dermabond    Patient tolerance: Patient tolerated the procedure well with no immediate complications.      Charlynne Pander, MD 07/17/17 [phone]

## 2017-07-18 ENCOUNTER — Other Ambulatory Visit: Payer: Self-pay

## 2017-07-18 ENCOUNTER — Encounter (HOSPITAL_COMMUNITY): Payer: Self-pay | Admitting: Family Medicine

## 2017-07-18 DIAGNOSIS — Z590 Homelessness: Secondary | ICD-10-CM | POA: Diagnosis not present

## 2017-07-18 DIAGNOSIS — S0181XA Laceration without foreign body of other part of head, initial encounter: Secondary | ICD-10-CM | POA: Diagnosis present

## 2017-07-18 DIAGNOSIS — F10939 Alcohol use, unspecified with withdrawal, unspecified: Secondary | ICD-10-CM

## 2017-07-18 DIAGNOSIS — R569 Unspecified convulsions: Secondary | ICD-10-CM | POA: Diagnosis present

## 2017-07-18 DIAGNOSIS — F10220 Alcohol dependence with intoxication, uncomplicated: Secondary | ICD-10-CM | POA: Diagnosis present

## 2017-07-18 DIAGNOSIS — Z811 Family history of alcohol abuse and dependence: Secondary | ICD-10-CM | POA: Diagnosis not present

## 2017-07-18 DIAGNOSIS — G40909 Epilepsy, unspecified, not intractable, without status epilepticus: Secondary | ICD-10-CM | POA: Diagnosis present

## 2017-07-18 DIAGNOSIS — Z91138 Patient's unintentional underdosing of medication regimen for other reason: Secondary | ICD-10-CM | POA: Diagnosis not present

## 2017-07-18 DIAGNOSIS — I10 Essential (primary) hypertension: Secondary | ICD-10-CM | POA: Diagnosis present

## 2017-07-18 DIAGNOSIS — K769 Liver disease, unspecified: Secondary | ICD-10-CM | POA: Diagnosis present

## 2017-07-18 DIAGNOSIS — W19XXXA Unspecified fall, initial encounter: Secondary | ICD-10-CM | POA: Diagnosis present

## 2017-07-18 DIAGNOSIS — F10239 Alcohol dependence with withdrawal, unspecified: Secondary | ICD-10-CM

## 2017-07-18 DIAGNOSIS — F17210 Nicotine dependence, cigarettes, uncomplicated: Secondary | ICD-10-CM | POA: Diagnosis present

## 2017-07-18 DIAGNOSIS — Y908 Blood alcohol level of 240 mg/100 ml or more: Secondary | ICD-10-CM | POA: Diagnosis present

## 2017-07-18 DIAGNOSIS — F419 Anxiety disorder, unspecified: Secondary | ICD-10-CM | POA: Diagnosis present

## 2017-07-18 DIAGNOSIS — T420X6A Underdosing of hydantoin derivatives, initial encounter: Secondary | ICD-10-CM | POA: Diagnosis present

## 2017-07-18 DIAGNOSIS — J449 Chronic obstructive pulmonary disease, unspecified: Secondary | ICD-10-CM | POA: Diagnosis present

## 2017-07-18 DIAGNOSIS — F329 Major depressive disorder, single episode, unspecified: Secondary | ICD-10-CM | POA: Diagnosis present

## 2017-07-18 MED ORDER — THIAMINE HCL 100 MG/ML IJ SOLN
Freq: Once | INTRAVENOUS | Status: AC
  Administered 2017-07-18: 11:00:00 via INTRAVENOUS
  Filled 2017-07-18: qty 1000

## 2017-07-18 MED ORDER — NICOTINE 14 MG/24HR TD PT24
14.0000 mg | MEDICATED_PATCH | Freq: Every day | TRANSDERMAL | Status: DC
  Administered 2017-07-18 – 2017-07-19 (×2): 14 mg via TRANSDERMAL
  Filled 2017-07-18 (×2): qty 1

## 2017-07-18 MED ORDER — ACETAMINOPHEN 325 MG PO TABS
650.0000 mg | ORAL_TABLET | Freq: Four times a day (QID) | ORAL | Status: DC | PRN
  Administered 2017-07-18: 650 mg via ORAL
  Filled 2017-07-18: qty 2

## 2017-07-18 MED ORDER — IPRATROPIUM-ALBUTEROL 0.5-2.5 (3) MG/3ML IN SOLN: 3.0000 mL | Freq: Four times a day (QID) | RESPIRATORY_TRACT | Status: DC | PRN

## 2017-07-18 MED ORDER — ENOXAPARIN SODIUM 40 MG/0.4ML ~~LOC~~ SOLN
40.0000 mg | SUBCUTANEOUS | Status: DC
  Administered 2017-07-18: 40 mg via SUBCUTANEOUS
  Filled 2017-07-18: qty 0.4

## 2017-07-18 MED ORDER — HYDRALAZINE HCL 20 MG/ML IJ SOLN: 5.0000 mg | Freq: Four times a day (QID) | INTRAMUSCULAR | Status: DC | PRN

## 2017-07-18 NOTE — ED Notes (Signed)
Bed: WHALD Expected date:  Expected time:  Means of arrival:  Comments: 

## 2017-07-18 NOTE — ED Notes (Signed)
ED TO INPATIENT HANDOFF REPORT  Name/Age/Gender Rodney Watson 52 y.o. male  Code Status    Code Status Orders  (From admission, onward)        Start     Ordered   07/18/17 1226  Full code  Continuous     07/18/17 1225    Code Status History    Date Active Date Inactive Code Status Order ID Comments User Context   06/29/2017 1826 07/03/2017 1458 Full Code [id number]  Ethelene Hal, NP Inpatient   06/28/2017 0620 06/29/2017 1732 Full Code [id number]  Ward, Delice Bison, DO ED   06/16/2017 1830 06/17/2017 1638 Full Code [id number]  Ward, Ozella Almond, PA-C ED   06/01/2017 2239 06/02/2017 1653 Full Code [id number]  Tanna Furry, MD ED   05/16/2017 0159 05/20/2017 1454 Full Code [id number]  Laverle Hobby, PA-C Inpatient   05/15/2017 1804 05/16/2017 0126 Full Code [id number]  Delia Heady, PA-C ED   05/11/2017 1902 05/12/2017 1616 Full Code [id number]  Volanda Napoleon, PA-C ED   04/13/2017 1258 04/16/2017 2139 Full Code [id number]  Vicenta Aly, NP Inpatient   04/12/2017 0048 04/13/2017 1223 Full Code [id number]  Muthersbaugh, Gwenlyn Perking ED   04/09/2017 2323 04/10/2017 1654 Full Code [id number]  Ward, Ozella Almond, PA-C ED   04/01/2017 1720 04/06/2017 2236 Full Code [id number]  Derrill Center, NP Inpatient   03/31/2017 0516 04/01/2017 1530 Full Code [id number]  Ezequiel Essex, MD ED      Home/SNF/Other Home  Chief Complaint Seizure  Level of Care/Admitting Diagnosis ED Disposition    ED Disposition Condition [place]: [facility] [100102]  Level of Care: Telemetry [5]  Admit to tele based on following criteria: Other see comments  Comments: Alcohol withdrawal  Diagnosis: Alcohol withdrawal ([place]) [291.81.ICD-9-CM]  Admitting Physician: Mariel Aloe [[id number]]  Attending Physician: Mariel Aloe [phone]  Estimated length of stay: past midnight tomorrow  Certification:: I certify this patient will need inpatient services for at least 2 midnights   PT Class (Do Not Modify): Inpatient [101]  PT Acc Code (Do Not Modify): Private [1]       Medical History Past Medical History:  Diagnosis Date  . Alcohol abuse   . COPD (chronic obstructive pulmonary disease) ([place])   . Depression   . ETOH abuse   . Seizures ([facility])     Allergies Allergies  Allergen Reactions  . Chocolate Hives  . Vicodin [Hydrocodone-Acetaminophen] Nausea And Vomiting    IV Location/Drains/Wounds Patient Lines/Drains/Airways Status   Active Line/Drains/Airways    Name:   Placement date:   Placement time:   Site:   Days:   Peripheral IV 07/17/17 Right Antecubital   07/17/17    1714    Antecubital   1          Labs/Imaging Results for orders placed or performed during the hospital encounter of 07/17/17 (from the past 48 hour(s))  CBG monitoring, ED     Status: None   Collection Time: 07/17/17  1:53 PM  Result Value Ref Range   Glucose-Capillary 82 65 - 99 mg/dL  Dilantin (phenytoin) Level (if patient is taking this medication)     Status: Abnormal   Collection Time: 07/17/17  2:13 PM  Result Value Ref Range   Phenytoin Lvl <2.5 (L) 10.0 - 20.0 ug/mL    Comment: Performed at [facility], [place] [address] [place], [place] [address]  CBC with Differential/Platelet  Status: Abnormal   Collection Time: 07/17/17  5:30 PM  Result Value Ref Range   WBC 4.6 4.0 - 10.5 K/uL   RBC 4.61 4.22 - 5.81 MIL/uL   Hemoglobin 14.6 13.0 - 17.0 g/dL   HCT 43.3 39.0 - 52.0 %   MCV 93.9 78.0 - 100.0 fL   MCH 31.7 26.0 - 34.0 pg   MCHC 33.7 30.0 - 36.0 g/dL   RDW 16.9 (H) 11.5 - 15.5 %   Platelets 163 150 - 400 K/uL   Neutrophils Relative % 46 %   Neutro Abs 2.1 1.7 - 7.7 K/uL   Lymphocytes Relative 46 %   Lymphs Abs 2.1 0.7 - 4.0 K/uL   Monocytes Relative 6 %   Monocytes Absolute 0.3 0.1 - 1.0 K/uL   Eosinophils Relative 2 %   Eosinophils Absolute 0.1 0.0 - 0.7 K/uL   Basophils Relative 2 %   Basophils Absolute 0.1 0.0 - 0.1 K/uL     Comment: Performed at [facility], [place] [address], [place], [place] [address]  Comprehensive metabolic panel     Status: Abnormal   Collection Time: 07/17/17  5:30 PM  Result Value Ref Range   Sodium 144 135 - 145 mmol/L   Potassium 3.6 3.5 - 5.1 mmol/L   Chloride 108 101 - 111 mmol/L   CO2 22 22 - 32 mmol/L   Glucose, Bld 146 (H) 65 - 99 mg/dL   BUN 10 6 - 20 mg/dL   Creatinine, Ser 0.74 0.61 - 1.24 mg/dL   Calcium 7.9 (L) 8.9 - 10.3 mg/dL   Total Protein 6.9 6.5 - 8.1 g/dL   Albumin 4.0 3.5 - 5.0 g/dL   AST 69 (H) 15 - 41 U/L   ALT 42 17 - 63 U/L   Alkaline Phosphatase 77 38 - 126 U/L   Total Bilirubin 0.3 0.3 - 1.2 mg/dL   GFR calc non Af Amer >60 >60 mL/min   GFR calc Af Amer >60 >60 mL/min    Comment: (NOTE) The eGFR has been calculated using the CKD EPI equation. This calculation has not been validated in all clinical situations. eGFR's persistently <60 mL/min signify possible Chronic Kidney Disease.    Anion gap 14 5 - 15    Comment: Performed at [facility], [place] [address], [place], [place] [address]  Ethanol     Status: Abnormal   Collection Time: 07/17/17  5:30 PM  Result Value Ref Range   Alcohol, Ethyl (B) 334 (HH) <10 mg/dL    Comment: CRITICAL RESULT CALLED TO, READ BACK BY AND VERIFIED WITH: TIM SMITH,RN [id number] @ 8832 BY J SCOTTON (NOTE) Lowest detectable limit for serum alcohol is 10 mg/dL. For medical purposes only. Performed at [facility], [place] [address], [place], [place] [address]    Dg Chest 1 View  Result Date: 07/17/2017 CLINICAL DATA:  witnessed seizure, unknown if fallen. Pt was found on ground by EMS, was able to ambulate to truck, alert and no complaints of pain. Pt states that he ran out of his Dilantin 2 weeks ago and has 2 seizures today. Pt is A&O x4 and does have [place] on board. Pt states his last drink was 5 hours ago. EXAM: CHEST  1 VIEW COMPARISON:  03/30/2017 FINDINGS: Lungs are  clear. Heart size and mediastinal contours are within normal limits. No effusion. Visualized bones unremarkable. IMPRESSION: No acute cardiopulmonary disease. Electronically Signed   By: Eden Emms.D.  On: 07/17/2017 15:14   Ct Head Wo Contrast  Result Date: 07/17/2017 CLINICAL DATA:  witnessed seizure, unknown if fallen. Pt was found on ground by EMS, was able to ambulate to truck, alert and no complaints of pain. Pt states that he ran out of his Dilantin 2 weeks ago and has 2 seizures today. Pt is A&O x4 EXAM: CT HEAD WITHOUT CONTRAST CT CERVICAL SPINE WITHOUT CONTRAST TECHNIQUE: Multidetector CT imaging of the head and cervical spine was performed following the standard protocol without intravenous contrast. Multiplanar CT image reconstructions of the cervical spine were also generated. COMPARISON:  05/11/2017 FINDINGS: CT HEAD FINDINGS Brain: No evidence of acute infarction, hemorrhage, hydrocephalus, extra-axial collection or mass lesion/mass effect. Vascular: No hyperdense vessel or unexpected calcification. Skull: Normal. Negative for fracture or focal lesion. Sinuses/Orbits: No acute finding. Other: Right frontal scalp soft tissue swelling. CT CERVICAL SPINE FINDINGS Alignment: Normal Skull base and vertebrae: No acute fracture. No primary bone lesion or focal pathologic process. Soft tissues and spinal canal: No prevertebral soft tissue swelling or fluid. No visible canal hematoma. Bilateral calcified carotid bifurcation plaque. Disc levels: Mild narrowing C5-6 and C6-7 with small endplate spurs. Bilateral osseous foraminal encroachment C4-5, C5-6, C6-7, C7-T1. Upper chest: Small subpleural blebs in both lung apices. Other: None IMPRESSION: 1. Negative for bleed or other acute intracranial process. 2. No cervical fracture or dislocation. 3. Cervical spondylitic changes as above. 4. Bilateral calcified carotid bifurcation plaque. Electronically Signed   By: Lucrezia Europe M.D.   On: 07/17/2017 18:02    Ct Cervical Spine Wo Contrast  Result Date: 07/17/2017 CLINICAL DATA:  witnessed seizure, unknown if fallen. Pt was found on ground by EMS, was able to ambulate to truck, alert and no complaints of pain. Pt states that he ran out of his Dilantin 2 weeks ago and has 2 seizures today. Pt is A&O x4 EXAM: CT HEAD WITHOUT CONTRAST CT CERVICAL SPINE WITHOUT CONTRAST TECHNIQUE: Multidetector CT imaging of the head and cervical spine was performed following the standard protocol without intravenous contrast. Multiplanar CT image reconstructions of the cervical spine were also generated. COMPARISON:  05/11/2017 FINDINGS: CT HEAD FINDINGS Brain: No evidence of acute infarction, hemorrhage, hydrocephalus, extra-axial collection or mass lesion/mass effect. Vascular: No hyperdense vessel or unexpected calcification. Skull: Normal. Negative for fracture or focal lesion. Sinuses/Orbits: No acute finding. Other: Right frontal scalp soft tissue swelling. CT CERVICAL SPINE FINDINGS Alignment: Normal Skull base and vertebrae: No acute fracture. No primary bone lesion or focal pathologic process. Soft tissues and spinal canal: No prevertebral soft tissue swelling or fluid. No visible canal hematoma. Bilateral calcified carotid bifurcation plaque. Disc levels: Mild narrowing C5-6 and C6-7 with small endplate spurs. Bilateral osseous foraminal encroachment C4-5, C5-6, C6-7, C7-T1. Upper chest: Small subpleural blebs in both lung apices. Other: None IMPRESSION: 1. Negative for bleed or other acute intracranial process. 2. No cervical fracture or dislocation. 3. Cervical spondylitic changes as above. 4. Bilateral calcified carotid bifurcation plaque. Electronically Signed   By: Lucrezia Europe M.D.   On: 07/17/2017 18:02   Dg Hip Unilat W Or Wo Pelvis 2-3 Views Right  Result Date: 07/17/2017 CLINICAL DATA:  witnessed seizure, unknown if fallen. Pt was found on ground by EMS, was able to ambulate to truck, alert and no complaints of  pain. Pt states that he ran out of his Dilantin 2 weeks ago and has 2 seizures today. Pt is A&O x4 and does have [place] on board. Pt states his last drink was  5 hours ago. 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 Negative for fracture or dislocation. Pelvis unremarkable. IMPRESSION: 1. No acute findings. 2. Hip DJD, right worse than left. Electronically Signed   By: Lucrezia Europe M.D.   On: 07/17/2017 15:15   Dg Femur Min 2 Views Right  Result Date: 07/17/2017 CLINICAL DATA:  pain Per order- pain Pt to ed via [facility] with complaints of a witnessed seizure, unknown if fallen. Pt was found on ground by EMS, was able to ambulate to truck, alert and no complaints of pain. Pt states that he ran out of his Dilant.*comment was truncated* EXAM: RIGHT FEMUR 2 VIEWS COMPARISON:  CT pelvis 03/30/2017 FINDINGS: There is no evidence of fracture or other focal bone lesions. Soft tissues are unremarkable. IMPRESSION: Negative. Electronically Signed   By: Lucrezia Europe M.D.   On: 07/17/2017 15:19    Pending Labs Unresulted Labs (From admission, onward)   Start     Ordered   07/25/17 0500  Creatinine, serum  (enoxaparin (LOVENOX)    CrCl >/= 30 ml/min)  Weekly,   R    Comments:  while on enoxaparin therapy    07/18/17 1225   07/19/17 8185  Basic metabolic panel  Tomorrow morning,   R     07/18/17 1225   07/19/17 0500  CBC  Tomorrow morning,   R     07/18/17 1225   07/18/17 1226  HIV antibody (Routine Testing)  Once,   R     07/18/17 1225      Vitals/Pain Today's Vitals   07/18/17 0738 07/18/17 0744 07/18/17 1044 07/18/17 1423  BP: (!) 162/88 (!) 159/97 (!) 163/89 (!) 160/84  Pulse: (!) 111 (!) 108 99 89  Resp:  18    Temp:  98.2 F (36.8 C)    TempSrc:  Oral    SpO2:  100%  100%  Weight:      Height:      PainSc:  0-No pain      Isolation Precautions No active isolations  Medications Medications  LORazepam (ATIVAN) tablet  1 mg ( Oral See Alternative 07/18/17 0540)    Or  LORazepam (ATIVAN) injection 1 mg (1 mg Intravenous Given 07/18/17 0540)  LORazepam (ATIVAN) tablet 0-4 mg (2 mg Oral Given 07/18/17 1101)    Followed by  LORazepam (ATIVAN) tablet 0-4 mg (has no administration in time range)  ipratropium-albuterol (DUONEB) 0.5-2.5 (3) MG/3ML nebulizer solution 3 mL (has no administration in time range)  nicotine (NICODERM CQ - dosed in mg/24 hours) patch 14 mg (14 mg Transdermal Patch Applied 07/18/17 1034)  enoxaparin (LOVENOX) injection 40 mg (has no administration in time range)  hydrALAZINE (APRESOLINE) injection 5 mg (has no administration in time range)  phenytoin (DILANTIN) 1,000 mg in sodium chloride 0.9 % 250 mL IVPB (0 mg Intravenous Stopped 07/17/17 1559)  LORazepam (ATIVAN) injection 2 mg (2 mg Intravenous Given 07/17/17 1714)  sodium chloride 0.9 % bolus 1,000 mL (0 mLs Intravenous Stopped 07/17/17 2135)  haloperidol lactate (HALDOL) injection 5 mg (5 mg Intravenous Given 07/17/17 1901)  sodium chloride 0.9 % 1,000 mL with thiamine 909 mg, folic acid 1 mg, multivitamins adult 10 mL infusion ( Intravenous New Bag/Given 07/18/17 1034)    Mobility walks with person assist

## 2017-07-18 NOTE — ED Notes (Signed)
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 Patient swaying back and forth, very unsteady on feet.

## 2017-07-18 NOTE — ED Provider Notes (Signed)
Patient signed out to me to monitor overnight.  Patient seen initially for possible seizure.  He has a history of seizure disorder, is noncompliant with his Dilantin.  He was administered IV Dilantin and an attempt was made for discharge.  At that time he fell because of ataxia.  CT head, cervical spine, x-rays were negative.  Patient was monitored through the night.  He reports that he has felt some improvement, however, he is still not ambulatory due to tremors and ataxia.  Patient will not be a candidate for discharge, will require hospitalization.   Gilda Crease, MD 07/18/17 [id number]

## 2017-07-18 NOTE — ED Notes (Signed)
Pt alert and ambulatory to bathroom.

## 2017-07-18 NOTE — H&P (Addendum)
History and Physical    Rodney Watson ZOX:[id number] DOB: [birth year] DOA: 07/17/2017  PCP: Patient, No Pcp Per Patient coming from: Home  Chief Complaint: Seizure  HPI: Rodney Watson is a 52 y.o. male with medical history significant of seizures, alcohol abuse, hypertension, depression.  He presented secondary to seizure episode.  He is found in the street.  He reports that he has not been taking his Dilantin because he ran out of his medication.  Since arriving to the ED, he received a Dilantin load and has been seizure-free since then.  While in the ED, he had persistent issues with ataxia and had a fall, injuring his head.  He also developed significant tremors.  ED Course: Vitals: Afebrile, mostly normal pulse with mild tachycardia this morning. Normal respiratory rate. Labs: Dilantin level undetectable, AST 69, glucose 146, ethanol level 334 Imaging: CT head and neck significant for no acute process Medications/Course: Dilantin, CIWA  Review of Systems: Review of Systems  Constitutional: Negative for chills and fever.  Cardiovascular: Negative for chest pain.  Gastrointestinal: Negative for abdominal pain and vomiting.  Neurological: Positive for tremors, seizures and loss of consciousness. Negative for sensory change.  Psychiatric/Behavioral: Negative for hallucinations. The patient is nervous/anxious.   All other systems reviewed and are negative.   Past Medical History:  Diagnosis Date  . Alcohol abuse   . COPD (chronic obstructive pulmonary disease) (HCC)   . Depression   . ETOH abuse   . Seizures (HCC)     No past surgical history on file.   reports that he has been smoking cigarettes.  He has a 58.50 pack-year smoking history. His smokeless tobacco use includes chew. He reports that he drinks alcohol. He reports that he has current or past drug history. Drug: Marijuana.  Allergies  Allergen Reactions  . Chocolate Hives  . Vicodin [Hydrocodone-Acetaminophen]  Nausea And Vomiting    Family History  Problem Relation Age of Onset  . Alcohol abuse Father   . Seizures Neg Hx     Prior to Admission medications   Medication Sig Start Date End Date Taking? Authorizing Provider  FLUoxetine (PROZAC) 20 MG capsule Take 1 capsule (20 mg total) by mouth daily. 07/03/17  Yes Charm Rings, NP  gabapentin (NEURONTIN) 300 MG capsule Take 1 capsule (300 mg total) by mouth 3 (three) times daily. 07/03/17  Yes Charm Rings, NP  lisinopril (PRINIVIL,ZESTRIL) 10 MG tablet Take 1 tablet (10 mg total) by mouth daily. For high blood pressure 04/17/17  Yes Nwoko, Nicole Kindred I, NP  traZODone (DESYREL) 100 MG tablet Take 1 tablet (100 mg total) by mouth at bedtime and may repeat dose one time if needed. 07/03/17  Yes Charm Rings, NP  phenytoin (DILANTIN) 100 MG ER capsule Take 1 capsule (100 mg total) by mouth 3 (three) times daily. 07/17/17   Mancel Bale, MD  sulfamethoxazole-trimethoprim (BACTRIM DS,SEPTRA DS) 800-160 MG tablet Take 1 tablet by mouth every 12 (twelve) hours. Patient not taking: Reported on 07/17/2017 07/03/17   Charm Rings, NP    Physical Exam: Vitals:   07/18/17 0223 07/18/17 0523 07/18/17 0738 07/18/17 0744  BP: (!) 152/106 (!) 160/87 (!) 162/88 (!) 159/97  Pulse: 95 100 (!) 111 (!) 108  Resp: 18   18  Temp:    98.2 F (36.8 C)  TempSrc:    Oral  SpO2: 100%   100%  Weight:      Height:         Constitutional:  NAD, calm, comfortable Eyes: PERRL, lids and conjunctivae normal ENMT: Mucous membranes are moist. Posterior pharynx clear of any exudate or lesions. Poor dentition.  Neck: normal, supple, no masses, no thyromegaly Respiratory: bilateral mild wheezing, no crackles. Normal respiratory effort. No accessory muscle use.  Cardiovascular: Regular rate and rhythm, no murmurs / rubs / gallops. No extremity edema. 2+ pedal pulses.  Abdomen: no tenderness, no masses palpated. No hepatosplenomegaly. Bowel sounds positive.    Musculoskeletal: no clubbing / cyanosis. No joint deformity upper and lower extremities. Good ROM, no contractures. Normal muscle tone.  Skin: no rashes, lesions, ulcers. No induration Neurologic: CN 2-12 grossly intact. Sensation intact, DTR normal. Strength 5/5 in all 4. Significant tremors of bilateral arms Psychiatric: Normal judgment and insight. Alert and oriented x 3. Normal mood. Flat affect   Labs on Admission: I have personally reviewed following labs and imaging studies  CBC: Recent Labs  Lab 07/17/17 1730  WBC 4.6  NEUTROABS 2.1  HGB 14.6  HCT 43.3  MCV 93.9  PLT 163   Basic Metabolic Panel: Recent Labs  Lab 07/17/17 1730  NA 144  K 3.6  CL 108  CO2 22  GLUCOSE 146*  BUN 10  CREATININE 0.74  CALCIUM 7.9*   GFR: Estimated Creatinine Clearance: 89.1 mL/min (by C-G formula based on SCr of 0.74 mg/dL). Liver Function Tests: Recent Labs  Lab 07/17/17 1730  AST 69*  ALT 42  ALKPHOS 77  BILITOT 0.3  PROT 6.9  ALBUMIN 4.0   CBG: Recent Labs  Lab 07/17/17 1353  GLUCAP 82   Urine analysis:    Component Value Date/Time   COLORURINE STRAW (A) 05/11/2017 1656   APPEARANCEUR CLEAR 05/11/2017 1656   LABSPEC 1.002 (L) 05/11/2017 1656   PHURINE 7.0 05/11/2017 1656   GLUCOSEU NEGATIVE 05/11/2017 1656   HGBUR NEGATIVE 05/11/2017 1656   BILIRUBINUR NEGATIVE 05/11/2017 1656   KETONESUR NEGATIVE 05/11/2017 1656   PROTEINUR NEGATIVE 05/11/2017 1656   NITRITE NEGATIVE 05/11/2017 1656   LEUKOCYTESUR NEGATIVE 05/11/2017 1656    Radiological Exams on Admission: Dg Chest 1 View  Result Date: 07/17/2017 CLINICAL DATA:  witnessed seizure, unknown if fallen. Pt was found on ground by EMS, was able to ambulate to truck, alert and no complaints of pain. Pt states that he ran out of his Dilantin 2 weeks ago and has 2 seizures today. Pt is A&O x4 and does have EOTH on board. Pt states his last drink was 5 hours ago. EXAM: CHEST  1 VIEW COMPARISON:  03/30/2017  FINDINGS: Lungs are clear. Heart size and mediastinal contours are within normal limits. No effusion. Visualized bones unremarkable. IMPRESSION: No acute cardiopulmonary disease. Electronically Signed   By: Corlis Leak M.D.   On: 07/17/2017 15:14   Ct Head Wo Contrast  Result Date: 07/17/2017 CLINICAL DATA:  witnessed seizure, unknown if fallen. Pt was found on ground by EMS, was able to ambulate to truck, alert and no complaints of pain. Pt states that he ran out of his Dilantin 2 weeks ago and has 2 seizures today. Pt is A&O x4 EXAM: CT HEAD WITHOUT CONTRAST CT CERVICAL SPINE WITHOUT CONTRAST TECHNIQUE: Multidetector CT imaging of the head and cervical spine was performed following the standard protocol without intravenous contrast. Multiplanar CT image reconstructions of the cervical spine were also generated. COMPARISON:  05/11/2017 FINDINGS: CT HEAD FINDINGS Brain: No evidence of acute infarction, hemorrhage, hydrocephalus, extra-axial collection or mass lesion/mass effect. Vascular: No hyperdense vessel or unexpected calcification. Skull: Normal. Negative  for fracture or focal lesion. Sinuses/Orbits: No acute finding. Other: Right frontal scalp soft tissue swelling. CT CERVICAL SPINE FINDINGS Alignment: Normal Skull base and vertebrae: No acute fracture. No primary bone lesion or focal pathologic process. Soft tissues and spinal canal: No prevertebral soft tissue swelling or fluid. No visible canal hematoma. Bilateral calcified carotid bifurcation plaque. Disc levels: Mild narrowing C5-6 and C6-7 with small endplate spurs. Bilateral osseous foraminal encroachment C4-5, C5-6, C6-7, C7-T1. Upper chest: Small subpleural blebs in both lung apices. Other: None IMPRESSION: 1. Negative for bleed or other acute intracranial process. 2. No cervical fracture or dislocation. 3. Cervical spondylitic changes as above. 4. Bilateral calcified carotid bifurcation plaque. Electronically Signed   By: Corlis Leak M.D.   On:  07/17/2017 18:02   Ct Cervical Spine Wo Contrast  Result Date: 07/17/2017 CLINICAL DATA:  witnessed seizure, unknown if fallen. Pt was found on ground by EMS, was able to ambulate to truck, alert and no complaints of pain. Pt states that he ran out of his Dilantin 2 weeks ago and has 2 seizures today. Pt is A&O x4 EXAM: CT HEAD WITHOUT CONTRAST CT CERVICAL SPINE WITHOUT CONTRAST TECHNIQUE: Multidetector CT imaging of the head and cervical spine was performed following the standard protocol without intravenous contrast. Multiplanar CT image reconstructions of the cervical spine were also generated. COMPARISON:  05/11/2017 FINDINGS: CT HEAD FINDINGS Brain: No evidence of acute infarction, hemorrhage, hydrocephalus, extra-axial collection or mass lesion/mass effect. Vascular: No hyperdense vessel or unexpected calcification. Skull: Normal. Negative for fracture or focal lesion. Sinuses/Orbits: No acute finding. Other: Right frontal scalp soft tissue swelling. CT CERVICAL SPINE FINDINGS Alignment: Normal Skull base and vertebrae: No acute fracture. No primary bone lesion or focal pathologic process. Soft tissues and spinal canal: No prevertebral soft tissue swelling or fluid. No visible canal hematoma. Bilateral calcified carotid bifurcation plaque. Disc levels: Mild narrowing C5-6 and C6-7 with small endplate spurs. Bilateral osseous foraminal encroachment C4-5, C5-6, C6-7, C7-T1. Upper chest: Small subpleural blebs in both lung apices. Other: None IMPRESSION: 1. Negative for bleed or other acute intracranial process. 2. No cervical fracture or dislocation. 3. Cervical spondylitic changes as above. 4. Bilateral calcified carotid bifurcation plaque. Electronically Signed   By: Corlis Leak M.D.   On: 07/17/2017 18:02   Dg Hip Unilat W Or Wo Pelvis 2-3 Views Right  Result Date: 07/17/2017 CLINICAL DATA:  witnessed seizure, unknown if fallen. Pt was found on ground by EMS, was able to ambulate to truck, alert and no  complaints of pain. Pt states that he ran out of his Dilantin 2 weeks ago and has 2 seizures today. Pt is A&O x4 and does have EOTH on board. Pt states his last drink was 5 hours ago. 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 Negative for fracture or dislocation. Pelvis unremarkable. IMPRESSION: 1. No acute findings. 2. Hip DJD, right worse than left. Electronically Signed   By: Corlis Leak M.D.   On: 07/17/2017 15:15   Dg Femur Min 2 Views Right  Result Date: 07/17/2017 CLINICAL DATA:  pain Per order- pain Pt to ed via [facility] with complaints of a witnessed seizure, unknown if fallen. Pt was found on ground by EMS, was able to ambulate to truck, alert and no complaints of pain. Pt states that he ran out of his Dilant.*comment was truncated* EXAM: RIGHT FEMUR 2 VIEWS COMPARISON:  CT pelvis 03/30/2017 FINDINGS: There  is no evidence of fracture or other focal bone lesions. Soft tissues are unremarkable. IMPRESSION: Negative. Electronically Signed   By: Corlis Leak M.D.   On: 07/17/2017 15:19    EKG: None available.  Assessment/Plan Principal Problem:   Alcohol withdrawal (HCC) Active Problems:   Alcohol abuse   Seizure disorder (HCC)   Essential hypertension    Alcohol withdrawal Alcohol abuse Patient continues to drink significantly. Drinks five 40 oz bottles of beer per day. Arrived with an ethanol level of 334 and is in withdrawal. -CIWA, stepdown -Social work consult  Seizure disorder Patient managed as an outpatient, but ran out of his medications secondary to poor outpatient follow-up. Currently in the works of finding a new provider. States he is working with [facility] He also has followed with [employer]. Recent seizure secondary to non-adherence as evidenced by subtherapeutic dilantin level. S/p dilantin load in ED. -Continue Dilantin and gabapentin (unsure if this is for seizures vs alcohol) -Case management  consult  COPD Stable. No exacerbation -Duoneb q6 hours prn  Tobacco abuse -Nicotine patch  History of major depression Has had admission to behavioral health secondary to suicidal ideation/depression. Currently stable. -Continue Prozac  Essential hypertension Slightly uncontrolled this morning. -Continue home lisinopril  Hepatic lesions Incidental. Thought secondary to benign lesions.   DVT prophylaxis: Lovenox Code Status: Full code Family Communication: None at bedside Disposition Plan: Discharge home with withdrawal symptoms treated adequately Consults called: None Admission status: Inpatient, stepdown   Jacquelin Hawking, MD [facility] Pager [phone]  If 7PM-7AM, please contact night-coverage [email or web address] Password [facility]  07/18/2017, 8:54 AM

## 2017-07-18 NOTE — ED Notes (Signed)
RN attempted to walk pt, pt was shaking and unable to ambulate safely. RN assisted pt to bathroom with Steady and pt had a BM on himself. Pt is stuttering and slightly anxious.

## 2017-07-19 DIAGNOSIS — F10230 Alcohol dependence with withdrawal, uncomplicated: Secondary | ICD-10-CM

## 2017-07-19 DIAGNOSIS — Z590 Homelessness: Secondary | ICD-10-CM

## 2017-07-19 DIAGNOSIS — I10 Essential (primary) hypertension: Secondary | ICD-10-CM

## 2017-07-19 DIAGNOSIS — R569 Unspecified convulsions: Secondary | ICD-10-CM

## 2017-07-19 DIAGNOSIS — G40909 Epilepsy, unspecified, not intractable, without status epilepticus: Secondary | ICD-10-CM

## 2017-07-19 DIAGNOSIS — F329 Major depressive disorder, single episode, unspecified: Secondary | ICD-10-CM

## 2017-07-19 DIAGNOSIS — Z720 Tobacco use: Secondary | ICD-10-CM

## 2017-07-19 DIAGNOSIS — F1010 Alcohol abuse, uncomplicated: Secondary | ICD-10-CM

## 2017-07-19 LAB — HIV ANTIBODY (ROUTINE TESTING W REFLEX): HIV Screen 4th Generation wRfx: NONREACTIVE

## 2017-07-19 LAB — CBC
HCT: 45 % (ref 39.0–52.0)
Hemoglobin: 15.2 g/dL (ref 13.0–17.0)
MCH: 31.5 pg (ref 26.0–34.0)
MCHC: 33.8 g/dL (ref 30.0–36.0)
MCV: 93.4 fL (ref 78.0–100.0)
Platelets: 129 10*3/uL — ABNORMAL LOW (ref 150–400)
RBC: 4.82 MIL/uL (ref 4.22–5.81)
RDW: 15.9 % — AB (ref 11.5–15.5)
WBC: 4 10*3/uL (ref 4.0–10.5)

## 2017-07-19 LAB — BASIC METABOLIC PANEL
Anion gap: 8 (ref 5–15)
BUN: 9 mg/dL (ref 6–20)
CALCIUM: 9 mg/dL (ref 8.9–10.3)
CHLORIDE: 100 mmol/L — AB (ref 101–111)
CO2: 32 mmol/L (ref 22–32)
Creatinine, Ser: 0.66 mg/dL (ref 0.61–1.24)
GFR calc Af Amer: >60 mL/min (ref >60)
GFR calc non Af Amer: >60 mL/min (ref >60)
GLUCOSE: 84 mg/dL (ref 65–99)
Potassium: 3.6 mmol/L (ref 3.5–5.1)
Sodium: 140 mmol/L (ref 135–145)

## 2017-07-19 MED ORDER — GABAPENTIN 300 MG PO CAPS
300.0000 mg | ORAL_CAPSULE | Freq: Three times a day (TID) | ORAL | Status: DC
  Administered 2017-07-19: 300 mg via ORAL
  Filled 2017-07-19: qty 1

## 2017-07-19 MED ORDER — PHENYTOIN SODIUM EXTENDED 100 MG PO CAPS
100.0000 mg | ORAL_CAPSULE | Freq: Three times a day (TID) | ORAL | Status: DC
  Administered 2017-07-19: 100 mg via ORAL
  Filled 2017-07-19: qty 1

## 2017-07-19 MED ORDER — CLONIDINE HCL 0.1 MG PO TABS
0.1000 mg | ORAL_TABLET | Freq: Three times a day (TID) | ORAL | Status: DC
  Administered 2017-07-19: 0.1 mg via ORAL
  Filled 2017-07-19: qty 1

## 2017-07-19 MED ORDER — FLUOXETINE HCL 20 MG PO CAPS
20.0000 mg | ORAL_CAPSULE | Freq: Every day | ORAL | Status: DC
  Administered 2017-07-19: 20 mg via ORAL
  Filled 2017-07-19: qty 1

## 2017-07-19 MED ORDER — GABAPENTIN 300 MG PO CAPS: 300.0000 mg | ORAL_CAPSULE | Freq: Three times a day (TID) | ORAL | 0 refills | Status: DC

## 2017-07-19 MED ORDER — LISINOPRIL 10 MG PO TABS: 10.0000 mg | ORAL_TABLET | Freq: Every day | ORAL | 0 refills | Status: DC

## 2017-07-19 MED ORDER — PHENYTOIN SODIUM EXTENDED 100 MG PO CAPS: 100.0000 mg | ORAL_CAPSULE | Freq: Three times a day (TID) | ORAL | 3 refills | Status: DC

## 2017-07-19 MED ORDER — TRAZODONE HCL 100 MG PO TABS: 100.0000 mg | ORAL_TABLET | Freq: Every evening | ORAL | 0 refills | Status: DC | PRN

## 2017-07-19 MED ORDER — FLUOXETINE HCL 20 MG PO CAPS: 20.0000 mg | ORAL_CAPSULE | Freq: Every day | ORAL | 0 refills | Status: DC

## 2017-07-19 MED ORDER — CLONIDINE HCL 0.1 MG PO TABS: 0.1000 mg | ORAL_TABLET | Freq: Three times a day (TID) | ORAL | Status: DC

## 2017-07-19 MED ORDER — TRAZODONE HCL 100 MG PO TABS: 100.0000 mg | ORAL_TABLET | Freq: Every evening | ORAL | Status: DC | PRN

## 2017-07-19 NOTE — Care Management Note (Signed)
Case Management Note  Patient Details  Name: Rodney Watson MRN: [id number] Date of Birth: November 28, 1965  Subjective/Objective:      Pt admitted for Alcohol withdrawal and seizure.              Action/Plan: Pt states he want to sign AMA papers to MD. MD tried to encourage pt to stay in hospital. Pt declined. Pt use [facility] for PCP and medications. Pt [place]. He is homeless. Pt continued to state that he will go get his medications.    Expected Discharge Date:  07/19/17               Expected Discharge Plan:  Against Medical Advice  In-House Referral:     Discharge planning Services  CM Consult  Post Acute Care Choice:    Choice offered to:     DME Arranged:    DME Agency:     HH Arranged:    HH Agency:     Status of Service:  Completed, signed off  If discussed at [employer] of Stay Meetings, dates discussed:    Additional CommentsGeni Bers, RN 07/19/2017, 11:15 AM

## 2017-07-19 NOTE — Progress Notes (Signed)
Patient states that his last drink was Tuesday, Jul 16, 2017. He states that he drinks from 4 to 6 Icehouse edge (8.0%) beers/day.

## 2017-07-19 NOTE — Progress Notes (Signed)
At bedside rounding patient stated that he wanted to leave against medical advice and sign papers. MD, this RN, and case management was at bedside during this time and tried to redirect patient as well as talk him into staying until discharge. Patient refused saying he was going to leave and work on himself, stated that he had money on his card to get all of his prescriptions and that he was going to try and stop drinking. Patient signed AMA papers and was wheeled out by NT.

## 2017-07-19 NOTE — Discharge Summary (Signed)
Physician Discharge Summary  Saketh Daubert ZOX:[id number] DOB: 06/16/1965 DOA: 07/17/2017  PCP: Patient, No Pcp Per  Admit date: 07/17/2017 Discharge date: 07/21/2017  Admitted From: homeless Disposition:  Same  LEAVING AMA  . He states he will stop drinking but it seems to me that he plans to leave so that he can continue drinking. He is a oriented x 3 and has capacity to make decisions. I, the nurse and the case manager have tried to convince him to stay in the hospital but he is resistent to it and has left AMA.     Discharge Condition:  FAIR    CODE STATUS:  FULL CODE   Consultations:  none    Discharge Diagnoses:  Principal Problem:   Alcohol withdrawal (HCC) Active Problems:   Alcohol abuse   Seizure disorder (HCC)   Essential hypertension  Depression  Tobacco abuse   Subjective: Anxious to leave the hospital. I have reviewed his home meds with him and he has shown me the meds he takes. He was prescribed 7 days worth when discharged from [facility] on 5/15 and he still has about half his tabs. He states he was taking them sparingly. He has not followed up with the [facility] to obtain more medications. I have given him all new prescriptions today and he states he will get medications fills.   Brief Summary: Rodney Watson is a 52 y/o homeless man with ETOH abuse, depression/anxiety, seizure disorder, HTN who is brought by EMS for a witnesses seizure. Ran out of Dilantin 2 wks a go and had 2 seizures on 2/28. Last drink was 5 hr before the seizures. In ED, found to have a laceration on nose, trouble walking, ETOH level 330.  Has had a [facility] stay from 3/28- 4/1 for ETOH abuse. Seen in ED again for the same on 4/29. Seen back in ED on 5/10 with ETOH intoxication and admitted to [facility] until 5/15  Hospital Course:   Seizures with seizure disorder - due to running out of Dilantin- given a bolus in ED and resumed on oral- I have given him a prescription  Alcohol abuse with alcohol withdrawal -  he is in withdrawal but he would like to go home today  Tobacco abuse - advised to stop smoking  History of major depression -Continue Prozac and gabapentin   Essential hypertension -Continue home lisinopril  Hepatic lesions Incidental. Thought secondary to benign lesions.   Discharge Exam: Vitals:   07/19/17 0623 07/19/17 0722  BP: (!) 154/98 (!) 156/90  Pulse: 75 76  Resp: 20   Temp: 98.9 F (37.2 C)   SpO2: 98%    Vitals:   07/18/17 2217 07/18/17 2219 07/19/17 0623 07/19/17 0722  BP: (!) 144/102 (!) 140/98 (!) 154/98 (!) 156/90  Pulse: 74 75 75 76  Resp:  18 20   Temp: 98.7 F (37.1 C)  98.9 F (37.2 C)   TempSrc: Oral  Oral   SpO2: 100% 100% 98%   Weight:      Height:        General: Pt is alert, awake, not in acute distress Cardiovascular: RRR, S1/S2 +, no rubs, no gallops Respiratory: CTA bilaterally, no wheezing, no rhonchi Abdominal: Soft, NT, ND, bowel sounds + Extremities: no edema, no cyanosis   Discharge Instructions  Discharge Instructions    Diet - low sodium heart healthy   Complete by:  As directed    Increase activity slowly   Complete by:  As directed  Allergies as of 07/19/2017      Reactions   Chocolate Hives   Vicodin [hydrocodone-acetaminophen] Nausea And Vomiting      Medication List    STOP taking these medications   sulfamethoxazole-trimethoprim 800-160 MG tablet Commonly known as:  BACTRIM DS,SEPTRA DS     TAKE these medications   FLUoxetine 20 MG capsule Commonly known as:  PROZAC Take 1 capsule (20 mg total) by mouth daily.   gabapentin 300 MG capsule Commonly known as:  NEURONTIN Take 1 capsule (300 mg total) by mouth 3 (three) times daily.   lisinopril 10 MG tablet Commonly known as:  PRINIVIL,ZESTRIL Take 1 tablet (10 mg total) by mouth daily. For high blood pressure   phenytoin 100 MG ER capsule Commonly known as:  DILANTIN Take 1 capsule (100 mg total) by mouth 3 (three) times daily.    traZODone 100 MG tablet Commonly known as:  DESYREL Take 1 tablet (100 mg total) by mouth at bedtime and may repeat dose one time if needed.       Allergies  Allergen Reactions  . Chocolate Hives  . Vicodin [Hydrocodone-Acetaminophen] Nausea And Vomiting     Procedures/Studies:    Dg Chest 1 View  Result Date: 07/17/2017 CLINICAL DATA:  witnessed seizure, unknown if fallen. Pt was found on ground by EMS, was able to ambulate to truck, alert and no complaints of pain. Pt states that he ran out of his Dilantin 2 weeks ago and has 2 seizures today. Pt is A&O x4 and does have EOTH on board. Pt states his last drink was 5 hours ago. EXAM: CHEST  1 VIEW COMPARISON:  03/30/2017 FINDINGS: Lungs are clear. Heart size and mediastinal contours are within normal limits. No effusion. Visualized bones unremarkable. IMPRESSION: No acute cardiopulmonary disease. Electronically Signed   By: Corlis Leak M.D.   On: 07/17/2017 15:14   Ct Head Wo Contrast  Result Date: 07/17/2017 CLINICAL DATA:  witnessed seizure, unknown if fallen. Pt was found on ground by EMS, was able to ambulate to truck, alert and no complaints of pain. Pt states that he ran out of his Dilantin 2 weeks ago and has 2 seizures today. Pt is A&O x4 EXAM: CT HEAD WITHOUT CONTRAST CT CERVICAL SPINE WITHOUT CONTRAST TECHNIQUE: Multidetector CT imaging of the head and cervical spine was performed following the standard protocol without intravenous contrast. Multiplanar CT image reconstructions of the cervical spine were also generated. COMPARISON:  05/11/2017 FINDINGS: CT HEAD FINDINGS Brain: No evidence of acute infarction, hemorrhage, hydrocephalus, extra-axial collection or mass lesion/mass effect. Vascular: No hyperdense vessel or unexpected calcification. Skull: Normal. Negative for fracture or focal lesion. Sinuses/Orbits: No acute finding. Other: Right frontal scalp soft tissue swelling. CT CERVICAL SPINE FINDINGS Alignment: Normal Skull  base and vertebrae: No acute fracture. No primary bone lesion or focal pathologic process. Soft tissues and spinal canal: No prevertebral soft tissue swelling or fluid. No visible canal hematoma. Bilateral calcified carotid bifurcation plaque. Disc levels: Mild narrowing C5-6 and C6-7 with small endplate spurs. Bilateral osseous foraminal encroachment C4-5, C5-6, C6-7, C7-T1. Upper chest: Small subpleural blebs in both lung apices. Other: None IMPRESSION: 1. Negative for bleed or other acute intracranial process. 2. No cervical fracture or dislocation. 3. Cervical spondylitic changes as above. 4. Bilateral calcified carotid bifurcation plaque. Electronically Signed   By: Corlis Leak M.D.   On: 07/17/2017 18:02   Ct Cervical Spine Wo Contrast  Result Date: 07/17/2017 CLINICAL DATA:  witnessed seizure, unknown if fallen.  Pt was found on ground by EMS, was able to ambulate to truck, alert and no complaints of pain. Pt states that he ran out of his Dilantin 2 weeks ago and has 2 seizures today. Pt is A&O x4 EXAM: CT HEAD WITHOUT CONTRAST CT CERVICAL SPINE WITHOUT CONTRAST TECHNIQUE: Multidetector CT imaging of the head and cervical spine was performed following the standard protocol without intravenous contrast. Multiplanar CT image reconstructions of the cervical spine were also generated. COMPARISON:  05/11/2017 FINDINGS: CT HEAD FINDINGS Brain: No evidence of acute infarction, hemorrhage, hydrocephalus, extra-axial collection or mass lesion/mass effect. Vascular: No hyperdense vessel or unexpected calcification. Skull: Normal. Negative for fracture or focal lesion. Sinuses/Orbits: No acute finding. Other: Right frontal scalp soft tissue swelling. CT CERVICAL SPINE FINDINGS Alignment: Normal Skull base and vertebrae: No acute fracture. No primary bone lesion or focal pathologic process. Soft tissues and spinal canal: No prevertebral soft tissue swelling or fluid. No visible canal hematoma. Bilateral calcified  carotid bifurcation plaque. Disc levels: Mild narrowing C5-6 and C6-7 with small endplate spurs. Bilateral osseous foraminal encroachment C4-5, C5-6, C6-7, C7-T1. Upper chest: Small subpleural blebs in both lung apices. Other: None IMPRESSION: 1. Negative for bleed or other acute intracranial process. 2. No cervical fracture or dislocation. 3. Cervical spondylitic changes as above. 4. Bilateral calcified carotid bifurcation plaque. Electronically Signed   By: Corlis Leak M.D.   On: 07/17/2017 18:02   Dg Hip Unilat W Or Wo Pelvis 2-3 Views Right  Result Date: 07/17/2017 CLINICAL DATA:  witnessed seizure, unknown if fallen. Pt was found on ground by EMS, was able to ambulate to truck, alert and no complaints of pain. Pt states that he ran out of his Dilantin 2 weeks ago and has 2 seizures today. Pt is A&O x4 and does have EOTH on board. Pt states his last drink was 5 hours ago. 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 Negative for fracture or dislocation. Pelvis unremarkable. IMPRESSION: 1. No acute findings. 2. Hip DJD, right worse than left. Electronically Signed   By: Corlis Leak M.D.   On: 07/17/2017 15:15   Dg Femur Min 2 Views Right  Result Date: 07/17/2017 CLINICAL DATA:  pain Per order- pain Pt to ed via [facility] with complaints of a witnessed seizure, unknown if fallen. Pt was found on ground by EMS, was able to ambulate to truck, alert and no complaints of pain. Pt states that he ran out of his Dilant.*comment was truncated* EXAM: RIGHT FEMUR 2 VIEWS COMPARISON:  CT pelvis 03/30/2017 FINDINGS: There is no evidence of fracture or other focal bone lesions. Soft tissues are unremarkable. IMPRESSION: Negative. Electronically Signed   By: Corlis Leak M.D.   On: 07/17/2017 15:19     The results of significant diagnostics from this hospitalization (including imaging, microbiology, ancillary and laboratory) are listed below for  reference.     Microbiology: No results found for this or any previous visit (from the past 240 hour(s)).   Labs: BNP (last 3 results) No results for input(s): BNP in the last 8760 hours. Basic Metabolic Panel: Recent Labs  Lab 07/17/17 1730 07/19/17 0450  NA 144 140  K 3.6 3.6  CL 108 100*  CO2 22 32  GLUCOSE 146* 84  BUN 10 9  CREATININE 0.74 0.66  CALCIUM 7.9* 9.0   Liver Function Tests: Recent Labs  Lab 07/17/17 1730  AST 69*  ALT 42  ALKPHOS 77  BILITOT 0.3  PROT 6.9  ALBUMIN 4.0   No results for input(s): LIPASE, AMYLASE in the last 168 hours. No results for input(s): AMMONIA in the last 168 hours. CBC: Recent Labs  Lab 07/17/17 1730 07/19/17 0450  WBC 4.6 4.0  NEUTROABS 2.1  --   HGB 14.6 15.2  HCT 43.3 45.0  MCV 93.9 93.4  PLT 163 129*   Cardiac Enzymes: No results for input(s): CKTOTAL, CKMB, CKMBINDEX, TROPONINI in the last 168 hours. BNP: Invalid input(s): POCBNP CBG: Recent Labs  Lab 07/17/17 1353  GLUCAP 82   D-Dimer No results for input(s): DDIMER in the last 72 hours. Hgb A1c No results for input(s): HGBA1C in the last 72 hours. Lipid Profile No results for input(s): CHOL, HDL, LDLCALC, TRIG, CHOLHDL, LDLDIRECT in the last 72 hours. Thyroid function studies No results for input(s): TSH, T4TOTAL, T3FREE, THYROIDAB in the last 72 hours.  Invalid input(s): FREET3 Anemia work up No results for input(s): VITAMINB12, FOLATE, FERRITIN, TIBC, IRON, RETICCTPCT in the last 72 hours. Urinalysis    Component Value Date/Time   COLORURINE STRAW (A) 05/11/2017 1656   APPEARANCEUR CLEAR 05/11/2017 1656   LABSPEC 1.002 (L) 05/11/2017 1656   PHURINE 7.0 05/11/2017 1656   GLUCOSEU NEGATIVE 05/11/2017 1656   HGBUR NEGATIVE 05/11/2017 1656   BILIRUBINUR NEGATIVE 05/11/2017 1656   KETONESUR NEGATIVE 05/11/2017 1656   PROTEINUR NEGATIVE 05/11/2017 1656   NITRITE NEGATIVE 05/11/2017 1656   LEUKOCYTESUR NEGATIVE 05/11/2017 1656   Sepsis  Labs Invalid input(s): PROCALCITONIN,  WBC,  LACTICIDVEN Microbiology No results found for this or any previous visit (from the past 240 hour(s)).   Time coordinating discharge in minutes: 40  SIGNED:   Calvert CantorSaima Ouita Nish, MD  [facility] 07/21/2017, 2:21 PM Pager   If 7PM-7AM, please contact night-coverage [email or web address] Password [email or web address]

## 2017-08-01 ENCOUNTER — Emergency Department (HOSPITAL_COMMUNITY): Payer: Medicaid Other

## 2017-08-01 ENCOUNTER — Other Ambulatory Visit: Payer: Self-pay

## 2017-08-01 ENCOUNTER — Encounter (HOSPITAL_COMMUNITY): Payer: Self-pay

## 2017-08-01 ENCOUNTER — Emergency Department (HOSPITAL_COMMUNITY)
Admission: EM | Admit: 2017-08-01 | Discharge: 2017-08-01 | Disposition: A | Discharge status: Home / Self Care | Payer: Medicaid Other | Attending: Emergency Medicine | Admitting: Emergency Medicine

## 2017-08-01 DIAGNOSIS — F10920 Alcohol use, unspecified with intoxication, uncomplicated: Secondary | ICD-10-CM | POA: Insufficient documentation

## 2017-08-01 DIAGNOSIS — F17210 Nicotine dependence, cigarettes, uncomplicated: Secondary | ICD-10-CM | POA: Insufficient documentation

## 2017-08-01 DIAGNOSIS — J449 Chronic obstructive pulmonary disease, unspecified: Secondary | ICD-10-CM | POA: Insufficient documentation

## 2017-08-01 DIAGNOSIS — F17220 Nicotine dependence, chewing tobacco, uncomplicated: Secondary | ICD-10-CM | POA: Insufficient documentation

## 2017-08-01 DIAGNOSIS — I10 Essential (primary) hypertension: Secondary | ICD-10-CM | POA: Diagnosis not present

## 2017-08-01 DIAGNOSIS — Z79899 Other long term (current) drug therapy: Secondary | ICD-10-CM | POA: Diagnosis not present

## 2017-08-01 DIAGNOSIS — R569 Unspecified convulsions: Secondary | ICD-10-CM | POA: Diagnosis present

## 2017-08-01 LAB — CBC
HEMATOCRIT: 46.6 % (ref 39.0–52.0)
HEMOGLOBIN: 16.3 g/dL (ref 13.0–17.0)
MCH: 32.2 pg (ref 26.0–34.0)
MCHC: 35 g/dL (ref 30.0–36.0)
MCV: 92.1 fL (ref 78.0–100.0)
Platelets: 116 10*3/uL — ABNORMAL LOW (ref 150–400)
RBC: 5.06 MIL/uL (ref 4.22–5.81)
RDW: 16.8 % — ABNORMAL HIGH (ref 11.5–15.5)
WBC: 5.7 10*3/uL (ref 4.0–10.5)

## 2017-08-01 LAB — BASIC METABOLIC PANEL
Anion gap: 10 (ref 5–15)
BUN: 8 mg/dL (ref 6–20)
CHLORIDE: 107 mmol/L (ref 101–111)
CO2: 26 mmol/L (ref 22–32)
Calcium: 8.9 mg/dL (ref 8.9–10.3)
Creatinine, Ser: 0.6 mg/dL — ABNORMAL LOW (ref 0.61–1.24)
GFR calc Af Amer: >60 mL/min (ref >60)
GLUCOSE: 97 mg/dL (ref 65–99)
POTASSIUM: 3.7 mmol/L (ref 3.5–5.1)
Sodium: 143 mmol/L (ref 135–145)

## 2017-08-01 LAB — ETHANOL: Alcohol, Ethyl (B): 338 mg/dL (ref <10)

## 2017-08-01 LAB — PHENYTOIN LEVEL, TOTAL: Phenytoin Lvl: <2.5 ug/mL — ABNORMAL LOW (ref 10.0–20.0)

## 2017-08-01 LAB — CBG MONITORING, ED: GLUCOSE-CAPILLARY: 107 mg/dL — AB (ref 65–99)

## 2017-08-01 MED ORDER — LORAZEPAM 2 MG/ML IJ SOLN
1.0000 mg | Freq: Once | INTRAMUSCULAR | Status: AC
  Administered 2017-08-01: 1 mg via INTRAVENOUS
  Filled 2017-08-01: qty 1

## 2017-08-01 MED ORDER — SODIUM CHLORIDE 0.9 % IV SOLN
1000.0000 mg | Freq: Once | INTRAVENOUS | Status: AC
  Administered 2017-08-01: 1000 mg via INTRAVENOUS
  Filled 2017-08-01: qty 20

## 2017-08-01 MED ORDER — PHENYTOIN SODIUM EXTENDED 100 MG PO CAPS: 100.0000 mg | ORAL_CAPSULE | Freq: Three times a day (TID) | ORAL | 0 refills | Status: DC

## 2017-08-01 NOTE — ED Notes (Signed)
Went into pt's room to introduce self to pt. While in room, writer asked pt to undress into hospital gown. When taking blanket off pt, writer found beer laying in bed with pt. Pt yelled "it's mine, it's mine". Writer exited room and placed pt's belongings at nurses station. Pt dressed into a gown.

## 2017-08-01 NOTE — ED Notes (Signed)
Patient transported to CT 

## 2017-08-01 NOTE — ED Notes (Signed)
Security notified of pt having alcohol in room.

## 2017-08-01 NOTE — ED Notes (Signed)
Ambulated pt 10 feet with assistance. Pt reported dizziness and fatigue. Pt assisted back into the bed.

## 2017-08-01 NOTE — Discharge Instructions (Signed)
Please return for any problem. Follow up with a regular physician in 2-3 days.

## 2017-08-01 NOTE — ED Triage Notes (Signed)
PT BIB EMS FOR A WITNESSED SEIZURE DOWNTOWN. PER EMS, UPON ARRIVAL AAOX4. PT ADMITS TO ETOH USE AND HAS NOT BEEN TAKING HIS DILANTIN AS HE SHOULD. PT C/O OF A HEADACHE AND NOT FEELING GOOD.

## 2017-08-01 NOTE — ED Notes (Signed)
Bed: [facility]ALA Expected date:  Expected time:  Means of arrival:  Comments: 

## 2017-08-01 NOTE — ED Provider Notes (Signed)
[facility]-EMERGENCY DEPT Provider Note   CSN: [id number] Arrival date & time: 08/01/17  1704     History   Chief Complaint Chief Complaint  Patient presents with  . Seizures    HPI Rodney Watson is a 52 y.o. male.  52 year old male with prior history of COPD, medication noncompliance, seizure disorder, and alcohol abuse presents with complaint of seizure.  Patient reports that he had a seizure earlier today.  Patient reports that he has not been taking his Dilantin for several weeks.  Patient also admits to significant regular alcohol intake.  He reports consumption of at least 1 40 ounce drink today.   He denies chest pain, shortness of breath, nausea, vomiting, or other acute complaint. He appears clinically intoxicated upon initial evaluation.  He requests something to eat upon my initial evaluation.   The history is provided by the patient and medical records.  Seizures   This is a recurrent problem. The current episode started 1 to 2 hours ago. The problem has not changed since onset.There was 1 seizure. The episode was witnessed. There was no sensation of an aura present. The seizures did not continue in the ED. There has been no fever.    Past Medical History:  Diagnosis Date  . Alcohol abuse   . COPD (chronic obstructive pulmonary disease) (HCC)   . Depression   . ETOH abuse   . Seizures [facility])     Patient Active Problem List   Diagnosis Date Noted  . Alcohol withdrawal (HCC) 07/18/2017  . Seizure disorder (HCC) 07/18/2017  . Essential hypertension 07/18/2017  . 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 MDD (major depressive disorder), recurrent episode, severe (HCC) 04/13/2017  . Alcohol abuse with alcohol-induced mood disorder (HCC) 04/10/2017  . Alcohol abuse 04/01/2017    History reviewed. No pertinent surgical history.      Home Medications    Prior to Admission medications     Medication Sig Start Date End Date Taking? Authorizing Provider  FLUoxetine (PROZAC) 20 MG capsule Take 1 capsule (20 mg total) by mouth daily. 07/19/17  Yes Calvert Cantor, MD  gabapentin (NEURONTIN) 300 MG capsule Take 1 capsule (300 mg total) by mouth 3 (three) times daily. 07/19/17  Yes Calvert Cantor, MD  lisinopril (PRINIVIL,ZESTRIL) 10 MG tablet Take 1 tablet (10 mg total) by mouth daily. For high blood pressure 07/19/17  Yes Rizwan, Ladell Heads, MD  phenytoin (DILANTIN) 100 MG ER capsule Take 1 capsule (100 mg total) by mouth 3 (three) times daily. 07/19/17  Yes Calvert Cantor, MD  traZODone (DESYREL) 100 MG tablet Take 1 tablet (100 mg total) by mouth at bedtime and may repeat dose one time if needed. 07/19/17  Yes Calvert Cantor, MD    Family History Family History  Problem Relation Age of Onset  . Alcohol abuse Father   . Seizures Neg Hx     Social History Social History   Tobacco Use  . Smoking status: Current Every Day Smoker    Packs/day: 1.50    Years: 39.00    Pack years: 58.50    Types: Cigarettes  . Smokeless tobacco: Current User    Types: Chew  Substance Use Topics  . Alcohol use: Yes    Comment: 5th daily  . Drug use: Yes    Types: Marijuana    Comment: 1-2 joints/week     Allergies   Chocolate and Vicodin [hydrocodone-acetaminophen]   Review of Systems Review of Systems  Neurological: Positive for seizures.  All other systems reviewed and are negative.    Physical Exam Updated Vital Signs BP 120/78   Pulse (!) 109   Temp 99 F (37.2 C) (Oral)   Resp (!) 21   Ht 5\' 8"  (1.727 m)   Wt 61.2 kg (135 lb)   SpO2 95%   BMI 20.53 kg/m   Physical Exam  Constitutional: He is oriented to person, place, and time. He appears well-developed and well-nourished. No distress.  Alert  No acute distress  Appears clinically intoxicated    HENT:  Head: Normocephalic and atraumatic.  Mouth/Throat: Oropharynx is clear and moist.  Eyes: Pupils are equal, round,  and reactive to light. Conjunctivae and EOM are normal.  Neck: Normal range of motion. Neck supple.  Cardiovascular: Normal rate, regular rhythm and normal heart sounds.  Pulmonary/Chest: Effort normal and breath sounds normal. No respiratory distress.  Abdominal: Soft. He exhibits no distension. There is no tenderness.  Musculoskeletal: Normal range of motion. He exhibits no edema or deformity.  Neurological: He is alert and oriented to person, place, and time.  Skin: Skin is warm and dry.  Psychiatric: He has a normal mood and affect.  Nursing note and vitals reviewed.    ED Treatments / Results  Labs (all labs ordered are listed, but only abnormal results are displayed) Labs Reviewed  BASIC METABOLIC PANEL - Abnormal; Notable for the following components:      Result Value   Creatinine, Ser 0.60 (*)    All other components within normal limits  PHENYTOIN LEVEL, TOTAL - Abnormal; Notable for the following components:   Phenytoin Lvl <2.5 (*)    All other components within normal limits  CBC - Abnormal; Notable for the following components:   RDW 16.8 (*)    Platelets 116 (*)    All other components within normal limits  ETHANOL - Abnormal; Notable for the following components:   Alcohol, Ethyl (B) 338 (*)    All other components within normal limits  CBG MONITORING, ED - Abnormal; Notable for the following components:   Glucose-Capillary 107 (*)    All other components within normal limits    EKG None  Radiology No results found.  Procedures Procedures (including critical care time)  Medications Ordered in ED Medications  phenytoin (DILANTIN) 1,000 mg in sodium chloride 0.9 % 250 mL IVPB (1,000 mg Intravenous New Bag/Given 08/01/17 1853)  LORazepam (ATIVAN) injection 1 mg (1 mg Intravenous Given 08/01/17 1811)     Initial Impression / Assessment and Plan / ED Course  I have reviewed the triage vital signs and the nursing notes.  Pertinent labs & imaging results  that were available during my care of the patient were reviewed by me and considered in my medical decision making (see chart for details).      2200 Patient re-eval. He appears more alert. He requests "something to eat."  After getting something to eat, he desires discharge. No evidence of DT's / withdrawal.   MDM  Screen complete  Patient presenting for evaluation of possible seizure activity. Patient with longstanding history of alcohol abuse and non-compliance.   Screening labs reveal elevated BAL.  No other significant finding on labs or head CT.   Patient loaded with dilantin.   Patient observed in ED until clinically sober. He appears stable for discharge.   Strict return precautions given and understood. The importance of close follow up advised.    Final Clinical Impressions(s) / ED Diagnoses  Final diagnoses:  Alcoholic intoxication without complication [facility](HCC)    ED Discharge Orders        Ordered    phenytoin (DILANTIN) 100 MG ER capsule  3 times daily     08/01/17 2248       Wynetta FinesMessick, Peter C, MD 08/01/17 2255

## 2017-08-01 NOTE — ED Notes (Signed)
Pt returned from radiology.

## 2017-08-08 ENCOUNTER — Emergency Department (HOSPITAL_COMMUNITY): Payer: Medicaid Other

## 2017-08-08 ENCOUNTER — Other Ambulatory Visit: Payer: Self-pay

## 2017-08-08 ENCOUNTER — Emergency Department (HOSPITAL_COMMUNITY)
Admission: EM | Admit: 2017-08-08 | Discharge: 2017-08-09 | Disposition: A | Discharge status: Home / Self Care | Payer: Medicaid Other | Attending: Emergency Medicine | Admitting: Emergency Medicine

## 2017-08-08 ENCOUNTER — Encounter (HOSPITAL_COMMUNITY): Payer: Self-pay

## 2017-08-08 DIAGNOSIS — G40909 Epilepsy, unspecified, not intractable, without status epilepticus: Secondary | ICD-10-CM | POA: Diagnosis not present

## 2017-08-08 DIAGNOSIS — W1830XA Fall on same level, unspecified, initial encounter: Secondary | ICD-10-CM | POA: Diagnosis not present

## 2017-08-08 DIAGNOSIS — F10920 Alcohol use, unspecified with intoxication, uncomplicated: Secondary | ICD-10-CM | POA: Diagnosis not present

## 2017-08-08 DIAGNOSIS — F17210 Nicotine dependence, cigarettes, uncomplicated: Secondary | ICD-10-CM | POA: Diagnosis not present

## 2017-08-08 DIAGNOSIS — Y908 Blood alcohol level of 240 mg/100 ml or more: Secondary | ICD-10-CM | POA: Insufficient documentation

## 2017-08-08 DIAGNOSIS — W19XXXA Unspecified fall, initial encounter: Secondary | ICD-10-CM

## 2017-08-08 DIAGNOSIS — J449 Chronic obstructive pulmonary disease, unspecified: Secondary | ICD-10-CM | POA: Insufficient documentation

## 2017-08-08 DIAGNOSIS — R569 Unspecified convulsions: Secondary | ICD-10-CM | POA: Diagnosis present

## 2017-08-08 DIAGNOSIS — I10 Essential (primary) hypertension: Secondary | ICD-10-CM | POA: Insufficient documentation

## 2017-08-08 DIAGNOSIS — Z79899 Other long term (current) drug therapy: Secondary | ICD-10-CM | POA: Diagnosis not present

## 2017-08-08 LAB — CBC WITH DIFFERENTIAL/PLATELET
Basophils Absolute: 0.1 10*3/uL (ref 0.0–0.1)
Basophils Relative: 1 %
EOS PCT: 2 %
Eosinophils Absolute: 0.1 10*3/uL (ref 0.0–0.7)
HEMATOCRIT: 42.3 % (ref 39.0–52.0)
HEMOGLOBIN: 14.5 g/dL (ref 13.0–17.0)
LYMPHS ABS: 1.8 10*3/uL (ref 0.7–4.0)
LYMPHS PCT: 42 %
MCH: 32.2 pg (ref 26.0–34.0)
MCHC: 34.3 g/dL (ref 30.0–36.0)
MCV: 93.8 fL (ref 78.0–100.0)
Monocytes Absolute: 0.5 10*3/uL (ref 0.1–1.0)
Monocytes Relative: 13 %
Neutro Abs: 1.7 10*3/uL (ref 1.7–7.7)
Neutrophils Relative %: 42 %
PLATELETS: 124 10*3/uL — AB (ref 150–400)
RBC: 4.51 MIL/uL (ref 4.22–5.81)
RDW: 17.1 % — ABNORMAL HIGH (ref 11.5–15.5)
WBC: 4.1 10*3/uL (ref 4.0–10.5)

## 2017-08-08 LAB — BASIC METABOLIC PANEL
Anion gap: 11 (ref 5–15)
CHLORIDE: 109 mmol/L (ref 101–111)
CO2: 23 mmol/L (ref 22–32)
Calcium: 8 mg/dL — ABNORMAL LOW (ref 8.9–10.3)
Creatinine, Ser: 0.65 mg/dL (ref 0.61–1.24)
GFR calc Af Amer: >60 mL/min (ref >60)
GFR calc non Af Amer: >60 mL/min (ref >60)
Glucose, Bld: 83 mg/dL (ref 65–99)
POTASSIUM: 4.6 mmol/L (ref 3.5–5.1)
SODIUM: 143 mmol/L (ref 135–145)

## 2017-08-08 LAB — CBG MONITORING, ED: Glucose-Capillary: 81 mg/dL (ref 65–99)

## 2017-08-08 LAB — RAPID URINE DRUG SCREEN, HOSP PERFORMED
AMPHETAMINES: NOT DETECTED
BENZODIAZEPINES: NOT DETECTED
Cocaine: NOT DETECTED
Opiates: NOT DETECTED
TETRAHYDROCANNABINOL: POSITIVE — AB

## 2017-08-08 LAB — ETHANOL: Alcohol, Ethyl (B): 384 mg/dL (ref <10)

## 2017-08-08 LAB — PHENYTOIN LEVEL, TOTAL

## 2017-08-08 MED ORDER — LORAZEPAM 2 MG/ML IJ SOLN: 0.0000 mg | Freq: Four times a day (QID) | INTRAMUSCULAR | Status: DC

## 2017-08-08 MED ORDER — LORAZEPAM 2 MG/ML IJ SOLN
0.0000 mg | Freq: Two times a day (BID) | INTRAMUSCULAR | Status: DC
Start: 2017-08-11 — End: 2017-08-09

## 2017-08-08 MED ORDER — LORAZEPAM 1 MG PO TABS: 0.0000 mg | ORAL_TABLET | Freq: Two times a day (BID) | ORAL | Status: DC

## 2017-08-08 MED ORDER — SODIUM CHLORIDE 0.9 % IV SOLN
1000.0000 mg | Freq: Once | INTRAVENOUS | Status: AC
  Administered 2017-08-08: 1000 mg via INTRAVENOUS
  Filled 2017-08-08: qty 20

## 2017-08-08 MED ORDER — VITAMIN B-1 100 MG PO TABS: 100.0000 mg | ORAL_TABLET | Freq: Every day | ORAL | Status: DC

## 2017-08-08 MED ORDER — THIAMINE HCL 100 MG/ML IJ SOLN: 100.0000 mg | Freq: Every day | INTRAMUSCULAR | Status: DC

## 2017-08-08 MED ORDER — THIAMINE HCL 100 MG/ML IJ SOLN
Freq: Once | INTRAVENOUS | Status: AC
  Administered 2017-08-08: 18:00:00 via INTRAVENOUS
  Filled 2017-08-08 (×2): qty 1000

## 2017-08-08 MED ORDER — LORAZEPAM 1 MG PO TABS: 0.0000 mg | ORAL_TABLET | Freq: Four times a day (QID) | ORAL | Status: DC

## 2017-08-08 MED ORDER — ACETAMINOPHEN 325 MG PO TABS: 650.0000 mg | ORAL_TABLET | ORAL | Status: DC | PRN

## 2017-08-08 MED ORDER — THIAMINE HCL 100 MG/ML IJ SOLN
100.0000 mg | Freq: Every day | INTRAMUSCULAR | Status: DC
  Administered 2017-08-08: 100 mg via INTRAMUSCULAR
  Filled 2017-08-08: qty 2

## 2017-08-08 MED ORDER — LORAZEPAM 2 MG/ML IJ SOLN
0.0000 mg | Freq: Four times a day (QID) | INTRAMUSCULAR | Status: DC
  Administered 2017-08-08: 2 mg via INTRAVENOUS
  Filled 2017-08-08: qty 1

## 2017-08-08 MED ORDER — ONDANSETRON HCL 4 MG PO TABS: 4.0000 mg | ORAL_TABLET | Freq: Three times a day (TID) | ORAL | Status: DC | PRN

## 2017-08-08 NOTE — ED Provider Notes (Signed)
[facility]-EMERGENCY DEPT Provider Note   CSN: [id number] Arrival date & time: 08/08/17  1707     History   Chief Complaint Chief Complaint  Patient presents with  . Seizures    HPI Rodney Watson is a 52 y.o. male.  52 year old male brought in by EMS for seizure.  Per EMS patient was not seizing upon their arrival.  Patient states he has been out of his Dilantin for 2-1/2 weeks, prescribed for epilepsy.  Patient states that he did fall backwards today injuring the back of his head and also reports loss of bladder control.  He denies alcohol or drug use today.  No other injuries, complaints or concerns.     Past Medical History:  Diagnosis Date  . Alcohol abuse   . COPD (chronic obstructive pulmonary disease) (HCC)   . Depression   . ETOH abuse   . Seizures [facility])     Patient Active Problem List   Diagnosis Date Noted  . Alcohol withdrawal (HCC) 07/18/2017  . Seizure disorder (HCC) 07/18/2017  . Essential hypertension 07/18/2017  . 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 MDD (major depressive disorder), recurrent episode, severe (HCC) 04/13/2017  . Alcohol abuse with alcohol-induced mood disorder (HCC) 04/10/2017  . Alcohol abuse 04/01/2017    History reviewed. No pertinent surgical history.      Home Medications    Prior to Admission medications   Medication Sig Start Date End Date Taking? Authorizing Provider  FLUoxetine (PROZAC) 20 MG capsule Take 1 capsule (20 mg total) by mouth daily. 07/19/17  Yes Calvert Cantor, MD  gabapentin (NEURONTIN) 300 MG capsule Take 1 capsule (300 mg total) by mouth 3 (three) times daily. 07/19/17  Yes Calvert Cantor, MD  lisinopril (PRINIVIL,ZESTRIL) 10 MG tablet Take 1 tablet (10 mg total) by mouth daily. For high blood pressure 07/19/17  Yes Rizwan, Ladell Heads, MD  phenytoin (DILANTIN) 100 MG ER capsule Take 1 capsule (100 mg total) by mouth 3 (three) times  daily. Patient taking differently: Take 200 mg by mouth 2 (two) times daily.  08/01/17  Yes Wynetta Fines, MD  traZODone (DESYREL) 100 MG tablet Take 1 tablet (100 mg total) by mouth at bedtime and may repeat dose one time if needed. 07/19/17  Yes Calvert Cantor, MD  phenytoin (DILANTIN) 100 MG ER capsule Take 1 capsule (100 mg total) by mouth 3 (three) times daily. Patient not taking: Reported on 08/08/2017 07/19/17   Calvert Cantor, MD    Family History Family History  Problem Relation Age of Onset  . Alcohol abuse Father   . Seizures Neg Hx     Social History Social History   Tobacco Use  . Smoking status: Current Every Day Smoker    Packs/day: 1.50    Years: 39.00    Pack years: 58.50    Types: Cigarettes  . Smokeless tobacco: Current User    Types: Chew  Substance Use Topics  . Alcohol use: Yes    Comment: 5th daily  . Drug use: Yes    Types: Marijuana    Comment: 1-2 joints/week     Allergies   Chocolate and Vicodin [hydrocodone-acetaminophen]   Review of Systems Review of Systems  Constitutional: Negative for fever.  Eyes: Negative for visual disturbance.  Respiratory: Negative for shortness of breath.   Cardiovascular: Negative for chest pain.  Gastrointestinal: Negative for abdominal pain, constipation, diarrhea, nausea and vomiting.  Genitourinary: Negative for difficulty urinating.  Musculoskeletal: Positive  for neck pain. Negative for arthralgias, back pain and myalgias.  Skin: Negative for rash and wound.  Neurological: Positive for seizures and headaches. Negative for dizziness and weakness.  Hematological: Does not bruise/bleed easily.  Psychiatric/Behavioral: Negative for confusion.  All other systems reviewed and are negative.    Physical Exam Updated Vital Signs BP (!) 139/104   Pulse 95   Temp 98.5 F (36.9 C) (Oral)   Resp 13   Ht 5\' 8"  (1.727 m)   Wt 63.5 kg (140 lb)   SpO2 96%   BMI 21.29 kg/m   Physical Exam  Constitutional: He  is oriented to person, place, and time. He appears well-developed.    Thin, poor hygiene   HENT:  Head: Normocephalic and atraumatic.  Cardiovascular: Normal rate, regular rhythm, normal heart sounds and intact distal pulses.  Pulmonary/Chest: Effort normal and breath sounds normal.  Abdominal: Soft. He exhibits no distension. There is no tenderness.  Musculoskeletal: He exhibits tenderness. He exhibits no deformity.       Cervical back: He exhibits tenderness and bony tenderness. He exhibits no laceration.       Thoracic back: He exhibits no tenderness and no bony tenderness.       Lumbar back: He exhibits no tenderness and no bony tenderness.  c-collar in place, midline neck TTP without stepoff or crepitus   Neurological: He is alert and oriented to person, place, and time. He has normal strength. He exhibits normal muscle tone. He displays no seizure activity. GCS eye subscore is 4. GCS verbal subscore is 5. GCS motor subscore is 6.  Skin: Skin is warm and dry.  Psychiatric: He has a normal mood and affect. His behavior is normal. Thought content normal. His speech is slurred. Cognition and memory are normal.  Nursing note and vitals reviewed.    ED Treatments / Results  Labs (all labs ordered are listed, but only abnormal results are displayed) Labs Reviewed  BASIC METABOLIC PANEL - Abnormal; Notable for the following components:      Result Value   BUN <5 (*)    Calcium 8.0 (*)    All other components within normal limits  CBC WITH DIFFERENTIAL/PLATELET - Abnormal; Notable for the following components:   RDW 17.1 (*)    Platelets 124 (*)    All other components within normal limits  ETHANOL - Abnormal; Notable for the following components:   Alcohol, Ethyl (B) 384 (*)    All other components within normal limits  RAPID URINE DRUG SCREEN, HOSP PERFORMED - Abnormal; Notable for the following components:   Tetrahydrocannabinol POSITIVE (*)    Barbiturates   (*)    Value:  Result not available. Reagent lot number recalled by manufacturer.   All other components within normal limits  PHENYTOIN LEVEL, TOTAL - Abnormal; Notable for the following components:   Phenytoin Lvl <2.5 (*)    All other components within normal limits  CBG MONITORING, ED    EKG None  Radiology Ct Head Wo Contrast  Result Date: 08/08/2017 CLINICAL DATA:  Seizure-like activity. EXAM: CT HEAD WITHOUT CONTRAST CT CERVICAL SPINE WITHOUT CONTRAST TECHNIQUE: Multidetector CT imaging of the head and cervical spine was performed following the standard protocol without intravenous contrast. Multiplanar CT image reconstructions of the cervical spine were also generated. COMPARISON:  August 01, 2017 FINDINGS: CT HEAD FINDINGS Brain: No evidence of acute infarction, hemorrhage, hydrocephalus, extra-axial collection or mass lesion/mass effect. There is chronic diffuse atrophy. Vascular: No hyperdense vessel or unexpected  calcification. Skull: Normal. Negative for fracture or focal lesion. Sinuses/Orbits: No acute finding. Other: None. CT CERVICAL SPINE FINDINGS Alignment: Normal. Skull base and vertebrae: No acute fracture. No primary bone lesion or focal pathologic process. Soft tissues and spinal canal: No prevertebral fluid or swelling. No visible canal hematoma. Disc levels: Degenerative joint changes with narrowed joint space and osteophyte formation are identified throughout cervical spine. Upper chest: Emphysematous changes of the lung apices are noted. Other: None. IMPRESSION: No focal acute intracranial abnormality identified. Chronic diffuse atrophy. No acute fracture or dislocation of cervical spine. Electronically Signed   By: Sherian Rein M.D.   On: 08/08/2017 19:45   Ct Cervical Spine Wo Contrast  Result Date: 08/08/2017 CLINICAL DATA:  Seizure-like activity. EXAM: CT HEAD WITHOUT CONTRAST CT CERVICAL SPINE WITHOUT CONTRAST TECHNIQUE: Multidetector CT imaging of the head and cervical spine was  performed following the standard protocol without intravenous contrast. Multiplanar CT image reconstructions of the cervical spine were also generated. COMPARISON:  August 01, 2017 FINDINGS: CT HEAD FINDINGS Brain: No evidence of acute infarction, hemorrhage, hydrocephalus, extra-axial collection or mass lesion/mass effect. There is chronic diffuse atrophy. Vascular: No hyperdense vessel or unexpected calcification. Skull: Normal. Negative for fracture or focal lesion. Sinuses/Orbits: No acute finding. Other: None. CT CERVICAL SPINE FINDINGS Alignment: Normal. Skull base and vertebrae: No acute fracture. No primary bone lesion or focal pathologic process. Soft tissues and spinal canal: No prevertebral fluid or swelling. No visible canal hematoma. Disc levels: Degenerative joint changes with narrowed joint space and osteophyte formation are identified throughout cervical spine. Upper chest: Emphysematous changes of the lung apices are noted. Other: None. IMPRESSION: No focal acute intracranial abnormality identified. Chronic diffuse atrophy. No acute fracture or dislocation of cervical spine. Electronically Signed   By: Sherian Rein M.D.   On: 08/08/2017 19:45    Procedures Procedures (including critical care time)  Medications Ordered in ED Medications  LORazepam (ATIVAN) injection 0-4 mg (has no administration in time range)    Or  LORazepam (ATIVAN) tablet 0-4 mg (has no administration in time range)  acetaminophen (TYLENOL) tablet 650 mg (has no administration in time range)  ondansetron (ZOFRAN) tablet 4 mg (has no administration in time range)  LORazepam (ATIVAN) injection 0-4 mg (has no administration in time range)    Or  LORazepam (ATIVAN) tablet 0-4 mg (has no administration in time range)  thiamine (VITAMIN B-1) tablet 100 mg ( Oral See Alternative 08/08/17 2144)    Or  thiamine (B-1) injection 100 mg (100 mg Intramuscular Given 08/08/17 2144)  phenytoin (DILANTIN) 1,000 mg in sodium  chloride 0.9 % 250 mL IVPB (0 mg Intravenous Stopped 08/08/17 2139)  sodium chloride 0.9 % 1,000 mL with thiamine 100 mg, folic acid 1 mg, multivitamins adult 10 mL infusion ( Intravenous Stopped 08/08/17 2139)     Initial Impression / Assessment and Plan / ED Course  I have reviewed the triage vital signs and the nursing notes.  Pertinent labs & imaging results that were available during my care of the patient were reviewed by me and considered in my medical decision making (see chart for details).  Clinical Course as of Aug 09 2342  Thu Aug 08, 2017  1040 52 year old male brought in by EMS for seizure.  Patient has a history of epilepsy, is prescribed Dilantin however states he has been out of his medication for the past 2-1/2 weeks.  Patient initially denies alcohol use however admits to alcohol consumption today after his alcohol level  returns at 384.  Patient's Dilantin level is subtherapeutic at less than 2.5, he was given a loading dose of Dilantin while in the ER.  His urine drug screen is positive for marijuana, CBC shows low platelets at 124, his BMP is unremarkable.  CT head and neck were done as patient states that he fell and has pain in the back of his head and in his neck.  CT head is negative for acute intracranial injury, CT C-spine negative for C-spine fracture or injury.  Patient was given a banana bag and CIWA protocol ordered however he has removed 3 IV lines and his CIWA protocol meds have been changed to IM.  Patient is sleeping at this time, occasionally wakes to check with nurses and goes back to sleep.  Plan is to monitor patient through the night and he may be discharged in the morning when able to ambulate.   [LM]    Clinical Course User Index [LM] Jeannie Fend, PA-C     Final Clinical Impressions(s) / ED Diagnoses   Final diagnoses:  Seizure disorder [facility])  Alcoholic intoxication without complication [facility])  Fall, initial encounter    ED Discharge Orders     None       Rodney Watson 08/08/17 2345    Eber Hong, MD 08/10/17 2127

## 2017-08-08 NOTE — ED Triage Notes (Signed)
BIBA- pt seen on sidewalk with seizure like activity. GCS 15 upon arrival. Pt admits to ETOH consumption today. FSBG 97. 132/78 110HR 98%RA

## 2017-08-08 NOTE — ED Notes (Signed)
Bed: [facility] Expected date:  Expected time:  Means of arrival:  Comments: EMS/ETOH 

## 2017-08-08 NOTE — ED Notes (Signed)
Patient went for ct scan.

## 2017-08-08 NOTE — ED Notes (Addendum)
Pt removed c collar. Pt advised not to. Pt states he is aware of risks

## 2017-08-09 ENCOUNTER — Other Ambulatory Visit: Payer: Self-pay

## 2017-08-09 NOTE — Care Management Note (Signed)
Case Management Note  CM consulted for medication assistance.  CM is unable to follow up with pt due to no phone number and being homeless.  Based on previous chart review pt [facility] and Lavinia SharpsMary Ann Placey, NP and knows that he can get his medications from there.  If pt returns to the ED, this CM will be happy to speak with him.  CM will send a note to Chales AbrahamsMary Ann, NP of pt's needs.  Roslynn AmbleUpdated Murphy, PA.  No further CM needs noted at this time.

## 2017-08-09 NOTE — ED Notes (Signed)
Patient ambulated to restroom.

## 2017-08-09 NOTE — Discharge Instructions (Signed)
Stop drinking alcohol. Take your dilatin as prescribed. Follow-up with your primary care doctor. Return here for any new/acute changes.

## 2017-08-10 ENCOUNTER — Emergency Department (HOSPITAL_COMMUNITY)
Admission: EM | Admit: 2017-08-10 | Discharge: 2017-08-10 | Disposition: A | Discharge status: Home / Self Care | Payer: Medicaid Other | Attending: Emergency Medicine | Admitting: Emergency Medicine

## 2017-08-10 ENCOUNTER — Encounter (HOSPITAL_COMMUNITY): Payer: Self-pay | Admitting: Emergency Medicine

## 2017-08-10 ENCOUNTER — Other Ambulatory Visit: Payer: Self-pay

## 2017-08-10 ENCOUNTER — Emergency Department (HOSPITAL_COMMUNITY)
Admission: EM | Admit: 2017-08-10 | Discharge: 2017-08-11 | Disposition: A | Discharge status: Home / Self Care | Payer: Medicaid Other | Attending: Emergency Medicine | Admitting: Emergency Medicine

## 2017-08-10 ENCOUNTER — Encounter (HOSPITAL_COMMUNITY): Payer: Self-pay | Admitting: Nurse Practitioner

## 2017-08-10 DIAGNOSIS — Z79899 Other long term (current) drug therapy: Secondary | ICD-10-CM | POA: Diagnosis not present

## 2017-08-10 DIAGNOSIS — F10120 Alcohol abuse with intoxication, uncomplicated: Secondary | ICD-10-CM | POA: Insufficient documentation

## 2017-08-10 DIAGNOSIS — F1210 Cannabis abuse, uncomplicated: Secondary | ICD-10-CM | POA: Insufficient documentation

## 2017-08-10 DIAGNOSIS — R569 Unspecified convulsions: Secondary | ICD-10-CM | POA: Insufficient documentation

## 2017-08-10 DIAGNOSIS — F17210 Nicotine dependence, cigarettes, uncomplicated: Secondary | ICD-10-CM | POA: Insufficient documentation

## 2017-08-10 DIAGNOSIS — F17220 Nicotine dependence, chewing tobacco, uncomplicated: Secondary | ICD-10-CM | POA: Diagnosis not present

## 2017-08-10 DIAGNOSIS — I10 Essential (primary) hypertension: Secondary | ICD-10-CM | POA: Diagnosis not present

## 2017-08-10 DIAGNOSIS — F10920 Alcohol use, unspecified with intoxication, uncomplicated: Secondary | ICD-10-CM

## 2017-08-10 DIAGNOSIS — J449 Chronic obstructive pulmonary disease, unspecified: Secondary | ICD-10-CM | POA: Insufficient documentation

## 2017-08-10 LAB — COMPREHENSIVE METABOLIC PANEL
ALBUMIN: 4.7 g/dL (ref 3.5–5.0)
ALT: 55 U/L (ref 17–63)
AST: 87 U/L — AB (ref 15–41)
Alkaline Phosphatase: 71 U/L (ref 38–126)
Anion gap: 13 (ref 5–15)
BUN: 9 mg/dL (ref 6–20)
CALCIUM: 8.2 mg/dL — AB (ref 8.9–10.3)
CO2: 27 mmol/L (ref 22–32)
CREATININE: 0.69 mg/dL (ref 0.61–1.24)
Chloride: 109 mmol/L (ref 101–111)
GFR calc Af Amer: >60 mL/min (ref >60)
GFR calc non Af Amer: >60 mL/min (ref >60)
GLUCOSE: 133 mg/dL — AB (ref 65–99)
Potassium: 4.2 mmol/L (ref 3.5–5.1)
SODIUM: 149 mmol/L — AB (ref 135–145)
Total Bilirubin: 0.4 mg/dL (ref 0.3–1.2)
Total Protein: 7.4 g/dL (ref 6.5–8.1)

## 2017-08-10 LAB — CBC WITH DIFFERENTIAL/PLATELET
BASOS PCT: 2 %
Basophils Absolute: 0.1 10*3/uL (ref 0.0–0.1)
EOS ABS: 0 10*3/uL (ref 0.0–0.7)
Eosinophils Relative: 0 %
HCT: 45.9 % (ref 39.0–52.0)
HEMOGLOBIN: 15.5 g/dL (ref 13.0–17.0)
Lymphocytes Relative: 17 %
Lymphs Abs: 0.6 10*3/uL — ABNORMAL LOW (ref 0.7–4.0)
MCH: 31.5 pg (ref 26.0–34.0)
MCHC: 33.8 g/dL (ref 30.0–36.0)
MCV: 93.3 fL (ref 78.0–100.0)
Monocytes Absolute: 0.3 10*3/uL (ref 0.1–1.0)
Monocytes Relative: 10 %
NEUTROS PCT: 71 %
Neutro Abs: 2.4 10*3/uL (ref 1.7–7.7)
Platelets: 121 10*3/uL — ABNORMAL LOW (ref 150–400)
RBC: 4.92 MIL/uL (ref 4.22–5.81)
RDW: 17 % — ABNORMAL HIGH (ref 11.5–15.5)
WBC: 3.4 10*3/uL — AB (ref 4.0–10.5)

## 2017-08-10 LAB — MAGNESIUM: Magnesium: 1.9 mg/dL (ref 1.7–2.4)

## 2017-08-10 LAB — ETHANOL: Alcohol, Ethyl (B): 259 mg/dL — ABNORMAL HIGH (ref <10)

## 2017-08-10 MED ORDER — ONDANSETRON HCL 4 MG/2ML IJ SOLN
4.0000 mg | Freq: Once | INTRAMUSCULAR | Status: AC
  Administered 2017-08-10: 4 mg via INTRAVENOUS
  Filled 2017-08-10: qty 2

## 2017-08-10 MED ORDER — SODIUM CHLORIDE 0.9 % IV SOLN
1500.0000 mg | Freq: Once | INTRAVENOUS | Status: AC
  Administered 2017-08-10: 1500 mg via INTRAVENOUS
  Filled 2017-08-10: qty 20

## 2017-08-10 MED ORDER — SODIUM CHLORIDE 0.9 % IV BOLUS
1000.0000 mL | Freq: Once | INTRAVENOUS | Status: AC
  Administered 2017-08-10: 1000 mL via INTRAVENOUS

## 2017-08-10 NOTE — ED Notes (Addendum)
Two RN's attempted to ambulate pt. Pt sts that he is unable to walk even though he was walking and had control of his legs in earlier assessment. Pt would not walk and pretended to fall but was held up by Lucrezia Europeoyce, Charity fundraiserN. Pt refused to walk by pulling legs up off floor. Pt had to be picked up and placed on stretcher.Pt slurring words and now appears to be more intoxicated than he was approx 30 minutes ago even though he had clear speech and was self sufficient, using the urinal w/o assistance. Pt behavior began when he was told he was being discharge. Pt states that he cannot walk "because I got dilantin". Pt in NAD. VSS

## 2017-08-10 NOTE — ED Triage Notes (Signed)
Pt states he is unable to walk and unable to leave the department at this time.

## 2017-08-10 NOTE — ED Triage Notes (Signed)
Per EMS: Pt had a seizure witnessed by  [employer]stand[employer].  Bystanders stated the seizure lasted a minute. Pt admitted to ETOH today.

## 2017-08-10 NOTE — ED Provider Notes (Signed)
[facility]-EMERGENCY DEPT Provider Note   CSN: [id number] Arrival date & time: 08/10/17  1709     History   Chief Complaint Chief Complaint  Patient presents with  . Seizures    HPI Dillin Lofgren is a 52 y.o. male. CC: I had a seizure.  I have been drinking  HPI 52 year old male.  Familiar to me from past ER visits.  History of alcohol use and abuse daily.  History of seizure disorder.  Has had prescriptions provided to him by case manager and social work.  Does not take medications.  Claims he had a seizure today.  Has been drinking  Illness.  No vomiting.  No fever.  No headache.  No injuries.  Reports a bug bite on his left leg  Past Medical History:  Diagnosis Date  . Alcohol abuse   . COPD (chronic obstructive pulmonary disease) (HCC)   . Depression   . ETOH abuse   . Seizures [facility])     Patient Active Problem List   Diagnosis Date Noted  . Alcohol withdrawal (HCC) 07/18/2017  . Seizure disorder (HCC) 07/18/2017  . Essential hypertension 07/18/2017  . 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 MDD (major depressive disorder), recurrent episode, severe (HCC) 04/13/2017  . Alcohol abuse with alcohol-induced mood disorder (HCC) 04/10/2017  . Alcohol abuse 04/01/2017    History reviewed. No pertinent surgical history.      Home Medications    Prior to Admission medications   Medication Sig Start Date End Date Taking? Authorizing Provider  FLUoxetine (PROZAC) 20 MG capsule Take 1 capsule (20 mg total) by mouth daily. 07/19/17   Calvert Cantor, MD  gabapentin (NEURONTIN) 300 MG capsule Take 1 capsule (300 mg total) by mouth 3 (three) times daily. 07/19/17   Calvert Cantor, MD  lisinopril (PRINIVIL,ZESTRIL) 10 MG tablet Take 1 tablet (10 mg total) by mouth daily. For high blood pressure 07/19/17   Calvert Cantor, MD  phenytoin (DILANTIN) 100 MG ER capsule Take 1 capsule (100 mg total) by mouth 3 (three)  times daily. Patient not taking: Reported on 08/08/2017 07/19/17   Calvert Cantor, MD  phenytoin (DILANTIN) 100 MG ER capsule Take 1 capsule (100 mg total) by mouth 3 (three) times daily. Patient taking differently: Take 200 mg by mouth 2 (two) times daily.  08/01/17   Wynetta Fines, MD  traZODone (DESYREL) 100 MG tablet Take 1 tablet (100 mg total) by mouth at bedtime and may repeat dose one time if needed. 07/19/17   Calvert Cantor, MD    Family History Family History  Problem Relation Age of Onset  . Alcohol abuse Father   . Seizures Neg Hx     Social History Social History   Tobacco Use  . Smoking status: Current Every Day Smoker    Packs/day: 1.50    Years: 39.00    Pack years: 58.50    Types: Cigarettes  . Smokeless tobacco: Current User    Types: Chew  Substance Use Topics  . Alcohol use: Yes    Comment: 5th daily  . Drug use: Yes    Types: Marijuana    Comment: 1-2 joints/week     Allergies   Chocolate and Vicodin [hydrocodone-acetaminophen]   Review of Systems Review of Systems  Constitutional: Negative for appetite change, chills, diaphoresis, fatigue and fever.  HENT: Negative for mouth sores, sore throat and trouble swallowing.   Eyes: Negative for visual disturbance.  Respiratory: Negative for  cough, chest tightness, shortness of breath and wheezing.   Cardiovascular: Negative for chest pain.  Gastrointestinal: Negative for abdominal distention, abdominal pain, diarrhea, nausea and vomiting.  Endocrine: Negative for polydipsia, polyphagia and polyuria.  Genitourinary: Negative for dysuria, frequency and hematuria.  Musculoskeletal: Negative for gait problem.  Skin: Negative for color change, pallor and rash.       Left leg bug bite  Neurological: Positive for seizures. Negative for dizziness, syncope, light-headedness and headaches.  Hematological: Does not bruise/bleed easily.  Psychiatric/Behavioral: Negative for behavioral problems and confusion.      Physical Exam Updated Vital Signs BP (!) 131/97 (BP Location: Left Arm)   Pulse 98   Temp 97.9 F (36.6 C) (Oral)   Resp 16   Ht 5\' 8"  (1.727 m)   Wt 63.5 kg (140 lb)   SpO2 98%   BMI 21.29 kg/m   Physical Exam  Constitutional: He is oriented to person, place, and time. He appears well-developed and well-nourished. No distress.  HENT:  Head: Normocephalic.  Eyes: Pupils are equal, round, and reactive to light. Conjunctivae are normal. No scleral icterus.  Neck: Normal range of motion. Neck supple. No thyromegaly present.  Cardiovascular: Normal rate and regular rhythm. Exam reveals no gallop and no friction rub.  No murmur heard. Pulmonary/Chest: Effort normal and breath sounds normal. No respiratory distress. He has no wheezes. He has no rales.  Abdominal: Soft. Bowel sounds are normal. He exhibits no distension. There is no tenderness. There is no rebound.  Musculoskeletal: Normal range of motion.  Neurological: He is alert and oriented to person, place, and time.  Skin: Skin is warm and dry. No rash noted.  Tiny red dot the size of a pencil eraser on his left leg does not appear infected or ominous  Psychiatric: He has a normal mood and affect. His behavior is normal.     ED Treatments / Results  Labs (all labs ordered are listed, but only abnormal results are displayed) Labs Reviewed - No data to display  EKG None  Radiology Ct Head Wo Contrast  Result Date: 08/08/2017 CLINICAL DATA:  Seizure-like activity. EXAM: CT HEAD WITHOUT CONTRAST CT CERVICAL SPINE WITHOUT CONTRAST TECHNIQUE: Multidetector CT imaging of the head and cervical spine was performed following the standard protocol without intravenous contrast. Multiplanar CT image reconstructions of the cervical spine were also generated. COMPARISON:  August 01, 2017 FINDINGS: CT HEAD FINDINGS Brain: No evidence of acute infarction, hemorrhage, hydrocephalus, extra-axial collection or mass lesion/mass effect.  There is chronic diffuse atrophy. Vascular: No hyperdense vessel or unexpected calcification. Skull: Normal. Negative for fracture or focal lesion. Sinuses/Orbits: No acute finding. Other: None. CT CERVICAL SPINE FINDINGS Alignment: Normal. Skull base and vertebrae: No acute fracture. No primary bone lesion or focal pathologic process. Soft tissues and spinal canal: No prevertebral fluid or swelling. No visible canal hematoma. Disc levels: Degenerative joint changes with narrowed joint space and osteophyte formation are identified throughout cervical spine. Upper chest: Emphysematous changes of the lung apices are noted. Other: None. IMPRESSION: No focal acute intracranial abnormality identified. Chronic diffuse atrophy. No acute fracture or dislocation of cervical spine. Electronically Signed   By: Sherian Rein M.D.   On: 08/08/2017 19:45   Ct Cervical Spine Wo Contrast  Result Date: 08/08/2017 CLINICAL DATA:  Seizure-like activity. EXAM: CT HEAD WITHOUT CONTRAST CT CERVICAL SPINE WITHOUT CONTRAST TECHNIQUE: Multidetector CT imaging of the head and cervical spine was performed following the standard protocol without intravenous contrast. Multiplanar CT image  reconstructions of the cervical spine were also generated. COMPARISON:  August 01, 2017 FINDINGS: CT HEAD FINDINGS Brain: No evidence of acute infarction, hemorrhage, hydrocephalus, extra-axial collection or mass lesion/mass effect. There is chronic diffuse atrophy. Vascular: No hyperdense vessel or unexpected calcification. Skull: Normal. Negative for fracture or focal lesion. Sinuses/Orbits: No acute finding. Other: None. CT CERVICAL SPINE FINDINGS Alignment: Normal. Skull base and vertebrae: No acute fracture. No primary bone lesion or focal pathologic process. Soft tissues and spinal canal: No prevertebral fluid or swelling. No visible canal hematoma. Disc levels: Degenerative joint changes with narrowed joint space and osteophyte formation are  identified throughout cervical spine. Upper chest: Emphysematous changes of the lung apices are noted. Other: None. IMPRESSION: No focal acute intracranial abnormality identified. Chronic diffuse atrophy. No acute fracture or dislocation of cervical spine. Electronically Signed   By: Sherian ReinWei-Chen  Lin M.D.   On: 08/08/2017 19:45    Procedures Procedures (including critical care time)  Medications Ordered in ED Medications  fosPHENYtoin (CEREBYX) 1,500 mg PE in sodium chloride 0.9 % 50 mL IVPB (0 mg PE Intravenous Stopped 08/10/17 1833)     Initial Impression / Assessment and Plan / ED Course  I have reviewed the triage vital signs and the nursing notes.  Pertinent labs & imaging results that were available during my care of the patient were reviewed by me and considered in my medical decision making (see chart for details).    Given 1500 PEs IV fosphenytoin.  Encouraged to follow-up with the prescription provided to him.  He is ambulatory independently.  Final Clinical Impressions(s) / ED Diagnoses   Final diagnoses:  Seizure-like activity [facility](HCC)    ED Discharge Orders    None       Rolland PorterJames, Erion Weightman, MD 08/10/17 1840

## 2017-08-10 NOTE — ED Notes (Signed)
Bed: WTR9 Expected date:  Expected time:  Means of arrival:  Comments: 

## 2017-08-10 NOTE — ED Provider Notes (Signed)
[facility]-EMERGENCY DEPT Provider Note   CSN: [id number] Arrival date & time: 08/10/17  2116     History   Chief Complaint No chief complaint on file.   HPI Rodney Watson is a 52 y.o. male.  Complaint is difficulty walking  HPI seen by myself earlier today after possible seizure.  Was discharged after being loaded with Dilantin.  Was amatory here.  Was at triage.  Apparently had a few episodes of emesis and now feels unsteady walking.  History of alcohol abuse.  History of seizure disorder.  Noncompliant with meds.  Drinks daily.  Past Medical History:  Diagnosis Date  . Alcohol abuse   . COPD (chronic obstructive pulmonary disease) (HCC)   . Depression   . ETOH abuse   . Seizures [facility])     Patient Active Problem List   Diagnosis Date Noted  . Alcohol withdrawal (HCC) 07/18/2017  . Seizure disorder (HCC) 07/18/2017  . Essential hypertension 07/18/2017  . 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 MDD (major depressive disorder), recurrent episode, severe (HCC) 04/13/2017  . Alcohol abuse with alcohol-induced mood disorder (HCC) 04/10/2017  . Alcohol abuse 04/01/2017    History reviewed. No pertinent surgical history.      Home Medications    Prior to Admission medications   Medication Sig Start Date End Date Taking? Authorizing Provider  FLUoxetine (PROZAC) 20 MG capsule Take 1 capsule (20 mg total) by mouth daily. 07/19/17   Calvert Cantor, MD  gabapentin (NEURONTIN) 300 MG capsule Take 1 capsule (300 mg total) by mouth 3 (three) times daily. 07/19/17   Calvert Cantor, MD  lisinopril (PRINIVIL,ZESTRIL) 10 MG tablet Take 1 tablet (10 mg total) by mouth daily. For high blood pressure 07/19/17   Calvert Cantor, MD  phenytoin (DILANTIN) 100 MG ER capsule Take 1 capsule (100 mg total) by mouth 3 (three) times daily. Patient not taking: Reported on 08/08/2017 07/19/17   Calvert Cantor, MD  phenytoin  (DILANTIN) 100 MG ER capsule Take 1 capsule (100 mg total) by mouth 3 (three) times daily. Patient taking differently: Take 200 mg by mouth 2 (two) times daily.  08/01/17   Wynetta Fines, MD  traZODone (DESYREL) 100 MG tablet Take 1 tablet (100 mg total) by mouth at bedtime and may repeat dose one time if needed. 07/19/17   Calvert Cantor, MD    Family History Family History  Problem Relation Age of Onset  . Alcohol abuse Father   . Seizures Neg Hx     Social History Social History   Tobacco Use  . Smoking status: Current Every Day Smoker    Packs/day: 1.50    Years: 39.00    Pack years: 58.50    Types: Cigarettes  . Smokeless tobacco: Current User    Types: Chew  Substance Use Topics  . Alcohol use: Yes    Comment: 5th daily  . Drug use: Yes    Types: Marijuana    Comment: 1-2 joints/week     Allergies   Chocolate and Vicodin [hydrocodone-acetaminophen]   Review of Systems Review of Systems  Constitutional: Negative for appetite change, chills, diaphoresis, fatigue and fever.  HENT: Negative for mouth sores, sore throat and trouble swallowing.   Eyes: Negative for visual disturbance.  Respiratory: Negative for cough, chest tightness, shortness of breath and wheezing.   Cardiovascular: Negative for chest pain.  Gastrointestinal: Positive for nausea and vomiting. Negative for abdominal distention, abdominal pain and diarrhea.  Endocrine: Negative for polydipsia, polyphagia and polyuria.  Genitourinary: Negative for dysuria, frequency and hematuria.  Musculoskeletal: Negative for gait problem.  Skin: Negative for color change, pallor and rash.  Neurological: Positive for weakness. Negative for dizziness, syncope, light-headedness and headaches.  Hematological: Does not bruise/bleed easily.  Psychiatric/Behavioral: Negative for behavioral problems and confusion.     Physical Exam Updated Vital Signs BP (!) 152/92 (BP Location: Right Arm)   Pulse 80   Temp 97.9  F (36.6 C) (Oral)   Resp 18   SpO2 98%   Physical Exam  Constitutional: He is oriented to person, place, and time. He appears well-developed and well-nourished. No distress.  HENT:  Head: Normocephalic.  Eyes: Pupils are equal, round, and reactive to light. Conjunctivae are normal. No scleral icterus.  Neck: Normal range of motion. Neck supple. No thyromegaly present.  Cardiovascular: Normal rate and regular rhythm. Exam reveals no gallop and no friction rub.  No murmur heard. Pulmonary/Chest: Effort normal and breath sounds normal. No respiratory distress. He has no wheezes. He has no rales.  Abdominal: Soft. Bowel sounds are normal. He exhibits no distension. There is no tenderness. There is no rebound.  Musculoskeletal: Normal range of motion.  Neurological: He is alert and oriented to person, place, and time.  Skin: Skin is warm and dry. No rash noted.  Psychiatric: He has a normal mood and affect. His behavior is normal.     ED Treatments / Results  Labs (all labs ordered are listed, but only abnormal results are displayed) Labs Reviewed  CBC WITH DIFFERENTIAL/PLATELET  COMPREHENSIVE METABOLIC PANEL  MAGNESIUM  ETHANOL    EKG None  Radiology No results found.  Procedures Procedures (including critical care time)  Medications Ordered in ED Medications  sodium chloride 0.9 % bolus 1,000 mL (has no administration in time range)  ondansetron (ZOFRAN) injection 4 mg (has no administration in time range)     Initial Impression / Assessment and Plan / ED Course  I have reviewed the triage vital signs and the nursing notes.  Pertinent labs & imaging results that were available during my care of the patient were reviewed by me and considered in my medical decision making (see chart for details).     IV fluids.  Antiemetic,  basic labs.  Will likely simply need some additional observation time.  Suspect some secondary gain from resisting discharge.  Final Clinical  Impressions(s) / ED Diagnoses   Final diagnoses:  None    ED Discharge Orders    None       Rolland PorterJames, Nazanin Kinner, MD 08/10/17 2301

## 2017-08-10 NOTE — Discharge Instructions (Addendum)
Take the Prescription for dilantin provided to you by the Case Manager at your last visit.

## 2017-08-11 MED ORDER — LORAZEPAM 1 MG PO TABS
1.0000 mg | ORAL_TABLET | Freq: Once | ORAL | Status: AC
  Administered 2017-08-11: 1 mg via ORAL
  Filled 2017-08-11: qty 1

## 2017-08-11 NOTE — ED Provider Notes (Signed)
I assumed care of this patient from Dr. Fayrene FearingJames at 000.  Please see their note for further details of Hx, PE.  Briefly patient is a 52 y.o. male who presented with alcohol intoxication with emesis and poor oral tolerance.  Screening labs ordered.   Current plan is to follow-up labs and reassess.  Labs grossly reassuring. patient is able to tolerate oral intake.  Allowed to metabolize to freedom.  In the morning patient became mildly tremulous.  [place] for.  Provided with oral Ativan.  On reassessment symptoms improved.  The patient appears reasonably screened and/or stabilized for discharge and I doubt any other medical condition or other [facility] requiring further screening, evaluation, or treatment in the ED at this time prior to discharge.  The patient is safe for discharge with strict return precautions.  Disposition: Discharge  Condition: Good  I have discussed the results, Dx and Tx plan with the patient who expressed understanding and agree(s) with the plan. Discharge instructions discussed at great length. The patient was given strict return precautions who verbalized understanding of the instructions. No further questions at time of discharge.    ED Discharge Orders    None        Umi Mainor, Amadeo GarnetPedro Eduardo, MD 08/11/17 [phone]

## 2017-08-11 NOTE — ED Notes (Addendum)
Pt given sandwich and water. Tolerating well.

## 2017-08-11 NOTE — ED Notes (Signed)
Pt resting comfortably in bed. Pt given water, tolerating well.

## 2017-08-12 ENCOUNTER — Encounter (HOSPITAL_COMMUNITY): Payer: Self-pay | Admitting: Emergency Medicine

## 2017-08-12 ENCOUNTER — Emergency Department (HOSPITAL_COMMUNITY)
Admission: EM | Admit: 2017-08-12 | Discharge: 2017-08-13 | Disposition: A | Discharge status: Home / Self Care | Payer: Self-pay | Attending: Emergency Medicine | Admitting: Emergency Medicine

## 2017-08-12 DIAGNOSIS — R569 Unspecified convulsions: Secondary | ICD-10-CM | POA: Insufficient documentation

## 2017-08-12 DIAGNOSIS — Z5321 Procedure and treatment not carried out due to patient leaving prior to being seen by health care provider: Secondary | ICD-10-CM | POA: Insufficient documentation

## 2017-08-12 LAB — PHENYTOIN LEVEL, TOTAL: PHENYTOIN LVL: 23 ug/mL — AB (ref 10.0–20.0)

## 2017-08-12 NOTE — ED Triage Notes (Signed)
Pt had one unwitnessed seizure. Pt found laying in grass by the road. Pt post ictal when EMS arrived. Pt alert and oriented upon arrival to ED. Pt reports he has been out of his dilantin for 2 weeks. Pt seen at hospital yesterday for the same. Hx of seizures.

## 2017-08-12 NOTE — ED Notes (Signed)
Pt did not respond when name was called for vitals reassessment

## 2017-08-15 ENCOUNTER — Other Ambulatory Visit: Payer: Self-pay

## 2017-08-15 ENCOUNTER — Emergency Department (HOSPITAL_COMMUNITY)
Admission: EM | Admit: 2017-08-15 | Discharge: 2017-08-15 | Disposition: A | Discharge status: Home / Self Care | Payer: Medicaid Other | Attending: Emergency Medicine | Admitting: Emergency Medicine

## 2017-08-15 ENCOUNTER — Encounter (HOSPITAL_COMMUNITY): Payer: Self-pay

## 2017-08-15 ENCOUNTER — Emergency Department (HOSPITAL_COMMUNITY): Payer: Medicaid Other

## 2017-08-15 DIAGNOSIS — Z79899 Other long term (current) drug therapy: Secondary | ICD-10-CM | POA: Diagnosis not present

## 2017-08-15 DIAGNOSIS — F17210 Nicotine dependence, cigarettes, uncomplicated: Secondary | ICD-10-CM | POA: Insufficient documentation

## 2017-08-15 DIAGNOSIS — J449 Chronic obstructive pulmonary disease, unspecified: Secondary | ICD-10-CM | POA: Insufficient documentation

## 2017-08-15 DIAGNOSIS — G40909 Epilepsy, unspecified, not intractable, without status epilepticus: Secondary | ICD-10-CM | POA: Diagnosis present

## 2017-08-15 LAB — BASIC METABOLIC PANEL
Anion gap: 16 — ABNORMAL HIGH (ref 5–15)
CALCIUM: 7.8 mg/dL — AB (ref 8.9–10.3)
CO2: 21 mmol/L — AB (ref 22–32)
CREATININE: 0.6 mg/dL — AB (ref 0.61–1.24)
Chloride: 106 mmol/L (ref 98–111)
GFR calc Af Amer: >60 mL/min (ref >60)
GFR calc non Af Amer: >60 mL/min (ref >60)
GLUCOSE: 84 mg/dL (ref 70–99)
Potassium: 3.5 mmol/L (ref 3.5–5.1)
Sodium: 143 mmol/L (ref 135–145)

## 2017-08-15 LAB — CBC WITH DIFFERENTIAL/PLATELET
BASOS ABS: 0.1 10*3/uL (ref 0.0–0.1)
BASOS PCT: 1 %
EOS ABS: 0.1 10*3/uL (ref 0.0–0.7)
Eosinophils Relative: 2 %
HCT: 43.7 % (ref 39.0–52.0)
HEMOGLOBIN: 14.3 g/dL (ref 13.0–17.0)
Lymphocytes Relative: 28 %
Lymphs Abs: 1.8 10*3/uL (ref 0.7–4.0)
MCH: 31 pg (ref 26.0–34.0)
MCHC: 32.7 g/dL (ref 30.0–36.0)
MCV: 94.6 fL (ref 78.0–100.0)
MONO ABS: 0.7 10*3/uL (ref 0.1–1.0)
Monocytes Relative: 11 %
NEUTROS PCT: 58 %
Neutro Abs: 3.7 10*3/uL (ref 1.7–7.7)
Platelets: 93 10*3/uL — ABNORMAL LOW (ref 150–400)
RBC: 4.62 MIL/uL (ref 4.22–5.81)
RDW: 17.3 % — ABNORMAL HIGH (ref 11.5–15.5)
WBC: 6.4 10*3/uL (ref 4.0–10.5)

## 2017-08-15 LAB — RAPID URINE DRUG SCREEN, HOSP PERFORMED
AMPHETAMINES: NOT DETECTED
Benzodiazepines: NOT DETECTED
Cocaine: NOT DETECTED
OPIATES: NOT DETECTED
TETRAHYDROCANNABINOL: POSITIVE — AB

## 2017-08-15 LAB — MAGNESIUM: Magnesium: 2 mg/dL (ref 1.7–2.4)

## 2017-08-15 LAB — PHENYTOIN LEVEL, TOTAL: Phenytoin Lvl: 8.7 ug/mL — ABNORMAL LOW (ref 10.0–20.0)

## 2017-08-15 LAB — ETHANOL: Alcohol, Ethyl (B): 389 mg/dL (ref <10)

## 2017-08-15 MED ORDER — SODIUM CHLORIDE 0.9 % IV SOLN
500.0000 mg | INTRAVENOUS | Status: AC
  Administered 2017-08-15: 500 mg via INTRAVENOUS
  Filled 2017-08-15: qty 10

## 2017-08-15 MED ORDER — SODIUM CHLORIDE 0.9 % IV BOLUS
1000.0000 mL | Freq: Once | INTRAVENOUS | Status: AC
  Administered 2017-08-15: 1000 mL via INTRAVENOUS

## 2017-08-15 MED ORDER — PHENYTOIN SODIUM EXTENDED 100 MG PO CAPS: 100.0000 mg | ORAL_CAPSULE | Freq: Three times a day (TID) | ORAL | 0 refills | Status: DC

## 2017-08-15 NOTE — ED Notes (Signed)
Patient ambulated and danced around the room without distress and steady gait.

## 2017-08-15 NOTE — ED Provider Notes (Signed)
[facility] EMERGENCY DEPARTMENT Provider Note   CSN: [id number] Arrival date & time: 08/15/17  1436     History   Chief Complaint Chief Complaint  Patient presents with  . Seizures    HPI Rodney Watson is a 52 y.o. male with a history of alcohol abuse, COPD, seizure disorder on Dilantin & homelessness who is well known to the department presenting to the emergency department today for reported seizure.  It is not clear if this was witnessed. He was reported to hit his head on the sidewalk.  He is unsure of loss of consciousness.  He reports one episode of emesis since the event.  He is currently in a c-collar.  Patient does admit to several alcoholic beverages including 4 beers prior to arrival today.  Patient is without tremor patient. He is currently complaining of a headache and neck pain.  He denies any preceding symptoms. He does not appear to be postictal.  He denies any visual changes, numbness/tingling/weakness the upper lower extremities.  No bleeding of the tongue, bowel or bladder incontinence.  He denies any chest pain, shortness of breath, low back pain, abdominal pain or diarrhea.   HPI  Past Medical History:  Diagnosis Date  . Alcohol abuse   . COPD (chronic obstructive pulmonary disease) (HCC)   . Depression   . ETOH abuse   . Seizures [facility])     Patient Active Problem List   Diagnosis Date Noted  . Alcohol withdrawal (HCC) 07/18/2017  . Seizure disorder (HCC) 07/18/2017  . Essential hypertension 07/18/2017  . 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 MDD (major depressive disorder), recurrent episode, severe (HCC) 04/13/2017  . Alcohol abuse with alcohol-induced mood disorder (HCC) 04/10/2017  . Alcohol abuse 04/01/2017    History reviewed. No pertinent surgical history.      Home Medications    Prior to Admission medications   Medication Sig Start Date End Date Taking? Authorizing  Provider  FLUoxetine (PROZAC) 20 MG capsule Take 1 capsule (20 mg total) by mouth daily. 07/19/17   Calvert Cantor, MD  gabapentin (NEURONTIN) 300 MG capsule Take 1 capsule (300 mg total) by mouth 3 (three) times daily. 07/19/17   Calvert Cantor, MD  lisinopril (PRINIVIL,ZESTRIL) 10 MG tablet Take 1 tablet (10 mg total) by mouth daily. For high blood pressure 07/19/17   Calvert Cantor, MD  phenytoin (DILANTIN) 100 MG ER capsule Take 1 capsule (100 mg total) by mouth 3 (three) times daily. Patient not taking: Reported on 08/08/2017 07/19/17   Calvert Cantor, MD  phenytoin (DILANTIN) 100 MG ER capsule Take 1 capsule (100 mg total) by mouth 3 (three) times daily. Patient taking differently: Take 200 mg by mouth 2 (two) times daily.  08/01/17   Wynetta Fines, MD  traZODone (DESYREL) 100 MG tablet Take 1 tablet (100 mg total) by mouth at bedtime and may repeat dose one time if needed. 07/19/17   Calvert Cantor, MD    Family History Family History  Problem Relation Age of Onset  . Alcohol abuse Father   . Seizures Neg Hx     Social History Social History   Tobacco Use  . Smoking status: Current Every Day Smoker    Packs/day: 1.50    Years: 39.00    Pack years: 58.50    Types: Cigarettes  . Smokeless tobacco: Current User    Types: Chew  Substance Use Topics  . Alcohol use: Yes  Comment: 5th daily  . Drug use: Yes    Types: Marijuana    Comment: 1-2 joints/week     Allergies   Chocolate and Vicodin [hydrocodone-acetaminophen]   Review of Systems Review of Systems  All other systems reviewed and are negative.    Physical Exam Updated Vital Signs BP (!) 118/91   Pulse (!) 101   Ht 5\' 8"  (1.727 m)   Wt 63.5 kg (140 lb)   SpO2 94%   BMI 21.29 kg/m   Physical Exam  Constitutional: He appears well-developed and well-nourished.  Disheveled  HENT:  Head: Normocephalic and atraumatic.  Right Ear: External ear normal.  Left Ear: External ear normal.  Nose: Nose normal.    Mouth/Throat: Uvula is midline, oropharynx is clear and moist and mucous membranes are normal. No tonsillar exudate.  Diffuse tenderness to scalp.  No palpable open or depressed skull fractures.  No raccoon eyes or battle signs.  No CSF otorrhea.  No hemotympanum.  No tongue trauma noted  Eyes: Pupils are equal, round, and reactive to light. Right eye exhibits no discharge. Left eye exhibits no discharge. No scleral icterus.  Neck:  Patient c-collar.  Tenderness at level of C4 and C5 no step-offs noted.  Cardiovascular: Normal rate, regular rhythm and intact distal pulses.  No murmur heard. Pulses:      Radial pulses are 2+ on the right side, and 2+ on the left side.       Dorsalis pedis pulses are 2+ on the right side, and 2+ on the left side.       Posterior tibial pulses are 2+ on the right side, and 2+ on the left side.  No lower extremity swelling or edema. Calves symmetric in size bilaterally.  Pulmonary/Chest: Effort normal and breath sounds normal. He exhibits no tenderness.  Clear to auscultation bilaterally.  Equal rise and fall of chest.  No tenderness across the chest wall.  No crepitus across chest wall.  Normal effort.  Able to speak in full sentences without difficulty.  No ecchymosis.  Abdominal: Soft. Bowel sounds are normal. He exhibits no distension. There is no tenderness. There is no rigidity, no rebound, no guarding and no CVA tenderness.  Musculoskeletal: He exhibits no edema.  No thoracic or lumbar spinous tenderness palpation or step-offs.  Lymphadenopathy:    He has no cervical adenopathy.  Neurological: He is alert. GCS eye subscore is 4. GCS verbal subscore is 5. GCS motor subscore is 6.  GCS 15.  Speech clear. Follows commands. No facial droop. PERRLA. EOM grossly intact. CN III-XII grossly intact. Grossly moves all extremities 4 without ataxia.  Equal strength and sensation for upper and lower extremities.  Skin: Skin is warm and dry. No rash noted. He is not  diaphoretic.  Psychiatric: He has a normal mood and affect.  Nursing note and vitals reviewed.    ED Treatments / Results  Labs (all labs ordered are listed, but only abnormal results are displayed) Labs Reviewed  BASIC METABOLIC PANEL - Abnormal; Notable for the following components:      Result Value   CO2 21 (*)    BUN <5 (*)    Creatinine, Ser 0.60 (*)    Calcium 7.8 (*)    Anion gap 16 (*)    All other components within normal limits  ETHANOL - Abnormal; Notable for the following components:   Alcohol, Ethyl (B) 389 (*)    All other components within normal limits  PHENYTOIN LEVEL, TOTAL -  Abnormal; Notable for the following components:   Phenytoin Lvl 8.7 (*)    All other components within normal limits  RAPID URINE DRUG SCREEN, HOSP PERFORMED - Abnormal; Notable for the following components:   Tetrahydrocannabinol POSITIVE (*)    Barbiturates   (*)    Value: Result not available. Reagent lot number recalled by manufacturer.   All other components within normal limits  CBC WITH DIFFERENTIAL/PLATELET - Abnormal; Notable for the following components:   RDW 17.3 (*)    Platelets 93 (*)    All other components within normal limits  MAGNESIUM    EKG None  Radiology Ct Head Wo Contrast  Result Date: 08/15/2017 CLINICAL DATA:  History of seizures with seizure and hitting back of head with pain posterior head and neck. EXAM: CT HEAD WITHOUT CONTRAST CT CERVICAL SPINE WITHOUT CONTRAST TECHNIQUE: Multidetector CT imaging of the head and cervical spine was performed following the standard protocol without intravenous contrast. Multiplanar CT image reconstructions of the cervical spine were also generated. COMPARISON:  None. FINDINGS: CT HEAD FINDINGS Brain: No evidence of acute infarction, hemorrhage, hydrocephalus, extra-axial collection or mass lesion/mass effect. Vascular: No hyperdense vessel or unexpected calcification. Skull: Normal. Negative for fracture or focal lesion.  Sinuses/Orbits: Orbits are normal. Paranasal sinuses are well developed and well aerated with minimal opacification over the anterior right ethmoid air cells and minimal mucosal membrane thickening over the left maxillary sinus. Mastoid air cells are clear. Other: None. CT CERVICAL SPINE FINDINGS Alignment: Normal. Skull base and vertebrae: Vertebral body heights are normal. There is mild to moderate spondylosis of the cervical spine. Uncovertebral joint spurring is present with mild facet arthropathy. Atlantoaxial articulation is normal. No evidence of acute fracture or subluxation. Moderate bilateral neural foraminal narrowing from the C4-5 level to the C6-7 level as well as C7-T1 level. Soft tissues and spinal canal: No prevertebral fluid or swelling. No visible canal hematoma. Disc levels:  Disc space narrowing at the C6-7 and C7-T1 levels. Upper chest: Negative. Other: None. IMPRESSION: No acute brain injury. No acute cervical spine injury. 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 Bilateral neural foraminal narrowing at multiple levels as described. Mild chronic sinus inflammatory change. Electronically Signed   By: Elberta Fortis M.D.   On: 08/15/2017 17:14   Ct Cervical Spine Wo Contrast  Result Date: 08/15/2017 CLINICAL DATA:  History of seizures with seizure and hitting back of head with pain posterior head and neck. EXAM: CT HEAD WITHOUT CONTRAST CT CERVICAL SPINE WITHOUT CONTRAST TECHNIQUE: Multidetector CT imaging of the head and cervical spine was performed following the standard protocol without intravenous contrast. Multiplanar CT image reconstructions of the cervical spine were also generated. COMPARISON:  None. FINDINGS: CT HEAD FINDINGS Brain: No evidence of acute infarction, hemorrhage, hydrocephalus, extra-axial collection or mass lesion/mass effect. Vascular: No hyperdense vessel or unexpected calcification. Skull: Normal. Negative for  fracture or focal lesion. Sinuses/Orbits: Orbits are normal. Paranasal sinuses are well developed and well aerated with minimal opacification over the anterior right ethmoid air cells and minimal mucosal membrane thickening over the left maxillary sinus. Mastoid air cells are clear. Other: None. CT CERVICAL SPINE FINDINGS Alignment: Normal. Skull base and vertebrae: Vertebral body heights are normal. There is mild to moderate spondylosis of the cervical spine. Uncovertebral joint spurring is present with mild facet arthropathy. Atlantoaxial articulation is normal. No evidence of acute fracture or subluxation. Moderate bilateral neural foraminal narrowing from the C4-5 level  to the C6-7 level as well as C7-T1 level. Soft tissues and spinal canal: No prevertebral fluid or swelling. No visible canal hematoma. Disc levels:  Disc space narrowing at the C6-7 and C7-T1 levels. Upper chest: Negative. Other: None. IMPRESSION: No acute brain injury. No acute cervical spine injury. 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 Bilateral neural foraminal narrowing at multiple levels as described. Mild chronic sinus inflammatory change. Electronically Signed   By: Elberta Fortisaniel  Boyle M.D.   On: 08/15/2017 17:14    Procedures Procedures (including critical care time)  Medications Ordered in ED Medications  sodium chloride 0.9 % bolus 1,000 mL (0 mLs Intravenous Stopped 08/15/17 1652)  phenytoin (DILANTIN) 500 mg in sodium chloride 0.9 % 100 mL IVPB (0 mg Intravenous Stopped 08/15/17 1905)     Initial Impression / Assessment and Plan / ED Course  I have reviewed the triage vital signs and the nursing notes.  Pertinent labs & imaging results that were available during my care of the patient were reviewed by me and considered in my medical decision making (see chart for details).     [age] year old male presenting with reported seizure.  This was unwitnessed.  He reports  he fell and hit his head.  He is complaining of headache as well as neck pain.  Patient in c-collar by EMS.  Patient does have a history of seizures is on Dilantin.  He states he has not had this for approximately 2 weeks, however his most recent Dilantin level was elevated at 23.  Appears patient was loaded with Dilantin during recent visit to the emergency department several days ago.  Will hold off on loading until Dilantin level returns.  Patient does admit to alcohol use today.  He will require CT head and neck.  Will obtain basic labs as well.  Will correlate alcohol level.  Will obtain CIWA.  Patient's UDS positive for THC.  Patient without any significant electrolyte derangements.  No acute kidney injury.  Alcohol level is 389.  Patient does not be appear to be in a withdrawal.  CIWA reported to be less than 8.  Anion gap is likely secondary to alcohol.  Patient without leukocytosis.  No anemia.  CT scan of head and neck unremarkable.  EKG was sinus tachycardia.  Mag level wnl. Phenytoin level subtherapeutic.  Discussed with pharmacist, Fleet Contrasachel, who recommended 500 mg of IV phenytoin given patient is near therapeutic levels.  After administration patient ambulated.  He is requesting discharge.  I feel this is appropriate.  Case management has seen the patient in regards to him not being able to fill his medication. Script given.   I advised the patient to follow-up with PCP this week.  Resources given on alcohol abuse and outside resources.  Specific return precautions discussed. Time was given for all questions to be answered. The patient verbalized understanding and agreement with plan. The patient appears safe for discharge home.  Final Clinical Impressions(s) / ED Diagnoses   Final diagnoses:  Seizure disorder [facility](HCC)    ED Discharge Orders        Ordered    phenytoin (DILANTIN) 100 MG ER capsule  3 times daily     08/15/17 1922       Princella PellegriniMaczis, Kemarion Abbey M, PA-C 08/15/17 1949    Mancel BaleWentz,  Elliott, MD 08/16/17 1705

## 2017-08-15 NOTE — ED Triage Notes (Signed)
[facility]. Pt states has hx of epilepsy, is supposed to be taking dilatin but ran out x2 weeks ago. Pt states he hit his head on the sidewalk. ETOH on board. Pt currently c/o HA. Pt currently alert. Pt in NAD on arrival.

## 2017-08-15 NOTE — Discharge Instructions (Addendum)
Please take your seizure medication.  He was seen by case management today.  Please follow their instructions.  Please follow with your primary care provider.  See attached handouts. If you develop worsening or new concerning symptoms you can return to the emergency department for re-evaluation.

## 2017-08-15 NOTE — ED Notes (Signed)
Patient transported to CT 

## 2017-08-15 NOTE — ED Provider Notes (Signed)
  Face-to-face evaluation   History: Patient brought by EMS after being found outside on the sidewalk, and possibly having had a seizure.  He complains of being out of his Dilantin.  He reportedly struck his head on the sidewalk at some point.  Physical exam: He is alert and conversant, and cooperative.  Pupils equal round react to light.  He moves all extremities equally.  There is no significant dysarthria or aphasia.  Medical screening examination/treatment/procedure(s) were conducted as a shared visit with non-physician practitioner(s) and myself.  I personally evaluated the patient during the encounter    Mancel BaleWentz, Barry Faircloth, MD 08/16/17 1705

## 2017-08-25 ENCOUNTER — Emergency Department (HOSPITAL_COMMUNITY)
Admission: EM | Admit: 2017-08-25 | Discharge: 2017-08-26 | Disposition: A | Discharge status: Home / Self Care | Payer: Medicaid Other | Attending: Emergency Medicine | Admitting: Emergency Medicine

## 2017-08-25 ENCOUNTER — Other Ambulatory Visit: Payer: Self-pay

## 2017-08-25 ENCOUNTER — Emergency Department (HOSPITAL_COMMUNITY): Payer: Medicaid Other

## 2017-08-25 DIAGNOSIS — I10 Essential (primary) hypertension: Secondary | ICD-10-CM | POA: Diagnosis not present

## 2017-08-25 DIAGNOSIS — J449 Chronic obstructive pulmonary disease, unspecified: Secondary | ICD-10-CM | POA: Insufficient documentation

## 2017-08-25 DIAGNOSIS — F10220 Alcohol dependence with intoxication, uncomplicated: Secondary | ICD-10-CM | POA: Diagnosis not present

## 2017-08-25 DIAGNOSIS — Y908 Blood alcohol level of 240 mg/100 ml or more: Secondary | ICD-10-CM | POA: Insufficient documentation

## 2017-08-25 DIAGNOSIS — R569 Unspecified convulsions: Secondary | ICD-10-CM | POA: Diagnosis present

## 2017-08-25 DIAGNOSIS — F17210 Nicotine dependence, cigarettes, uncomplicated: Secondary | ICD-10-CM | POA: Insufficient documentation

## 2017-08-25 DIAGNOSIS — Z79899 Other long term (current) drug therapy: Secondary | ICD-10-CM | POA: Insufficient documentation

## 2017-08-25 DIAGNOSIS — F10920 Alcohol use, unspecified with intoxication, uncomplicated: Secondary | ICD-10-CM

## 2017-08-25 MED ORDER — THIAMINE HCL 100 MG/ML IJ SOLN
100.0000 mg | Freq: Once | INTRAMUSCULAR | Status: AC
  Administered 2017-08-25: 100 mg via INTRAVENOUS
  Filled 2017-08-25: qty 2

## 2017-08-25 MED ORDER — SODIUM CHLORIDE 0.9 % IV BOLUS
1000.0000 mL | Freq: Once | INTRAVENOUS | Status: AC
  Administered 2017-08-25: 1000 mL via INTRAVENOUS

## 2017-08-25 NOTE — ED Triage Notes (Signed)
[facility] [employer]. Pt had ETOH on board as well as pt states that he's consumed "a half gallon of Listerine." Pt presents with 50 fl oz bottle of Listerine 2/3 empty.  BP:162/117  HR:97  RR:18 95% RA  CBG 95

## 2017-08-25 NOTE — ED Notes (Signed)
Bed: WA11 Expected date:  Expected time:  Means of arrival:  Comments: Ems seizure 

## 2017-08-25 NOTE — ED Provider Notes (Signed)
[facility]-EMERGENCY DEPT Provider Note   CSN: [id number] Arrival date & time: 08/25/17  2204     History   Chief Complaint Chief Complaint  Patient presents with  . Seizures    HPI Rodney Watson is a 52 y.o. male.  HPI HPI Rodney Watson is a 52 y.o. male with a history of alcohol abuse, COPD, seizure disorder on Dilantin & homelessness who is well known to the department presenting to the emergency department today for reported seizure that was witnessed by [facility].  Patient states that he has not taken his Dilantin for the past 2 and half weeks.  States he had the back of his head on the ground.  Patient remembers the accident.  He reports drinking several alcoholic beverages today including 4 "tall boys".  Patient also had a bottle of Listerine with him that he has been drinking as well.  Patient complains of headache and neck pain.  Denies any preceding symptoms.  Patient denies any loss of bowel or bladder and has no trauma to the tongue.  Denies any visual changes, numbness, tingling or weakness to the upper or lower extremities.  Patient denies any chest pain, shortness of breath, lower back pain, abdominal pain.  He has not taken anything for the pain.  Pain is worse with palpation or range of motion.  She has not followed up with neurology for her refill his Dilantin and states he cannot get this filled due to financial concern.  Pt denies any fever, chill, vision changes, lightheadedness, dizziness, congestion, neck pain, cp, sob, cough, abd pain, n/v/d, urinary symptoms, change in bowel habits, melena, hematochezia, lower extremity paresthesias.    Past Medical History:  Diagnosis Date  . Alcohol abuse   . COPD (chronic obstructive pulmonary disease) (HCC)   . Depression   . ETOH abuse   . Seizures [facility])     Patient Active Problem List   Diagnosis Date Noted  . Alcohol withdrawal (HCC) 07/18/2017  . Seizure disorder (HCC) 07/18/2017    . Essential hypertension 07/18/2017  . 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 MDD (major depressive disorder), recurrent episode, severe (HCC) 04/13/2017  . Alcohol abuse with alcohol-induced mood disorder (HCC) 04/10/2017  . Alcohol abuse 04/01/2017    No past surgical history on file.      Home Medications    Prior to Admission medications   Medication Sig Start Date End Date Taking? Authorizing Provider  FLUoxetine (PROZAC) 20 MG capsule Take 1 capsule (20 mg total) by mouth daily. 07/19/17   Calvert Cantor, MD  gabapentin (NEURONTIN) 300 MG capsule Take 1 capsule (300 mg total) by mouth 3 (three) times daily. 07/19/17   Calvert Cantor, MD  lisinopril (PRINIVIL,ZESTRIL) 10 MG tablet Take 1 tablet (10 mg total) by mouth daily. For high blood pressure 07/19/17   Calvert Cantor, MD  phenytoin (DILANTIN) 100 MG ER capsule Take 1 capsule (100 mg total) by mouth 3 (three) times daily. 08/15/17   Maczis, Elmer Sow, PA-C  traZODone (DESYREL) 100 MG tablet Take 1 tablet (100 mg total) by mouth at bedtime and may repeat dose one time if needed. 07/19/17   Calvert Cantor, MD    Family History Family History  Problem Relation Age of Onset  . Alcohol abuse Father   . Seizures Neg Hx     Social History Social History   Tobacco Use  . Smoking status: Current Every Day Smoker  Packs/day: 1.50    Years: 39.00    Pack years: 58.50    Types: Cigarettes  . Smokeless tobacco: Current User    Types: Chew  Substance Use Topics  . Alcohol use: Yes    Comment: 5th daily  . Drug use: Yes    Types: Marijuana    Comment: 1-2 joints/week     Allergies   Chocolate and Vicodin [hydrocodone-acetaminophen]   Review of Systems Review of Systems  All other systems reviewed and are negative.    Physical Exam Updated Vital Signs BP (!) 153/106 (BP Location: Right Arm) Comment: Will notify RN  Pulse 95   Temp 98.1 F (36.7 C) (Oral)   Resp  15   Ht 5\' 8"  (1.727 m)   Wt 63.5 kg (140 lb)   SpO2 94%   BMI 21.29 kg/m   Physical Exam Constitutional: He appears well-developed and well-nourished.  Disheveled  HENT:  Head: Normocephalic and atraumatic.  Right Ear: External ear normal.  Left Ear: External ear normal.  Nose: Nose normal.  Mouth/Throat: Uvula is midline, oropharynx is clear and moist and mucous membranes are normal. No tonsillar exudate.  Diffuse tenderness to scalp.  No palpable open or depressed skull fractures.  No raccoon eyes or battle signs.  No CSF otorrhea.  No hemotympanum.  No tongue trauma noted  Eyes: Pupils are equal, round, and reactive to light. Right eye exhibits no discharge. Left eye exhibits no discharge. No scleral icterus.  Neck: FROM, diffuse tenderness along the C-spine without any deformities or step-offs noted.  Cardiovascular: Normal rate, regular rhythm and intact distal pulses.  No murmur heard. Pulses:      Radial pulses are 2+ on the right side, and 2+ on the left side.       Dorsalis pedis pulses are 2+ on the right side, and 2+ on the left side.       Posterior tibial pulses are 2+ on the right side, and 2+ on the left side.  No lower extremity swelling or edema. Calves symmetric in size bilaterally.  Pulmonary/Chest: Effort normal and breath sounds normal. He exhibits no tenderness.  Clear to auscultation bilaterally.  Equal rise and fall of chest.  No tenderness across the chest wall.  No crepitus across chest wall.  Normal effort.  Able to speak in full sentences without difficulty.  No ecchymosis.  Abdominal: Soft. Bowel sounds are normal. He exhibits no distension. There is no tenderness. There is no rigidity, no rebound, no guarding and no CVA tenderness.  Musculoskeletal: He exhibits no edema.  No thoracic or lumbar spinous tenderness palpation or step-offs.  Lymphadenopathy:    He has no cervical adenopathy.  Neurological: He is alert. GCS eye subscore is 4. GCS verbal  subscore is 5. GCS motor subscore is 6.  GCS 15.  Speech clear. Follows commands. No facial droop. PERRLA. EOM grossly intact. CN III-XII grossly intact. Grossly moves all extremities 4 without ataxia.  Equal strength and sensation for upper and lower extremities.  Skin: Skin is warm and dry. No rash noted. He is not diaphoretic.  Psychiatric: He has a normal mood and affect.  Nursing note and vitals reviewed.   ED Treatments / Results  Labs (all labs ordered are listed, but only abnormal results are displayed) Labs Reviewed  ETHANOL - Abnormal; Notable for the following components:      Result Value   Alcohol, Ethyl (B) 351 (*)    All other components within normal limits  PHENYTOIN LEVEL, TOTAL - Abnormal; Notable for the following components:   Phenytoin Lvl <2.5 (*)    All other components within normal limits    EKG None  Radiology Ct Head Wo Contrast  Result Date: 08/25/2017 CLINICAL DATA:  Seizure, fall, ethanol abuse, head trauma, headache EXAM: CT HEAD WITHOUT CONTRAST CT CERVICAL SPINE WITHOUT CONTRAST TECHNIQUE: Multidetector CT imaging of the head and cervical spine was performed following the standard protocol without intravenous contrast. Multiplanar CT image reconstructions of the cervical spine were also generated. COMPARISON:  08/08/2017 FINDINGS: CT HEAD FINDINGS Brain: Generalized atrophy. Normal ventricular morphology. No midline shift or mass effect. Otherwise normal appearance of brain parenchyma. No intracranial hemorrhage, mass lesion, or evidence of acute infarction. No extra-axial fluid collections. Vascular: No hyperdense vessels Skull: Skull intact.  Small RIGHT supraorbital scalp hematoma. Sinuses/Orbits: Visualized paranasal sinuses and mastoid air cells clear Other: N/A CT CERVICAL SPINE FINDINGS Alignment: Normal Skull base and vertebrae: Osseous mineralization normal. Visualized skull base intact. Multilevel disc space narrowing and endplate spur formation.  No acute fracture or subluxation. Uncovertebral spurs encroach upon cervical neural foramina bilaterally from C4-C5 through C7-T1. Soft tissues and spinal canal: Prevertebral soft tissues normal thickness. Atherosclerotic calcifications at the carotid bifurcations. Disc levels: Mild AP narrowing the spinal canal at C5-C6 and C6-C7 due to endplate spur formation and bulging disc. Upper chest: Lung apices clear Other: N/A IMPRESSION: Generalized atrophy. No acute intracranial abnormalities. Multilevel degenerative disc disease changes of the cervical spine with multilevel neural foraminal encroachments by uncovertebral spurs as above. No acute cervical spine abnormalities. Electronically Signed   By: Ulyses SouthwardMark  Boles M.D.   On: 08/25/2017 23:49   Ct Cervical Spine Wo Contrast  Result Date: 08/25/2017 CLINICAL DATA:  Seizure, fall, ethanol abuse, head trauma, headache EXAM: CT HEAD WITHOUT CONTRAST CT CERVICAL SPINE WITHOUT CONTRAST TECHNIQUE: Multidetector CT imaging of the head and cervical spine was performed following the standard protocol without intravenous contrast. Multiplanar CT image reconstructions of the cervical spine were also generated. COMPARISON:  08/08/2017 FINDINGS: CT HEAD FINDINGS Brain: Generalized atrophy. Normal ventricular morphology. No midline shift or mass effect. Otherwise normal appearance of brain parenchyma. No intracranial hemorrhage, mass lesion, or evidence of acute infarction. No extra-axial fluid collections. Vascular: No hyperdense vessels Skull: Skull intact.  Small RIGHT supraorbital scalp hematoma. Sinuses/Orbits: Visualized paranasal sinuses and mastoid air cells clear Other: N/A CT CERVICAL SPINE FINDINGS Alignment: Normal Skull base and vertebrae: Osseous mineralization normal. Visualized skull base intact. Multilevel disc space narrowing and endplate spur formation. No acute fracture or subluxation. Uncovertebral spurs encroach upon cervical neural foramina bilaterally from  C4-C5 through C7-T1. Soft tissues and spinal canal: Prevertebral soft tissues normal thickness. Atherosclerotic calcifications at the carotid bifurcations. Disc levels: Mild AP narrowing the spinal canal at C5-C6 and C6-C7 due to endplate spur formation and bulging disc. Upper chest: Lung apices clear Other: N/A IMPRESSION: Generalized atrophy. No acute intracranial abnormalities. Multilevel degenerative disc disease changes of the cervical spine with multilevel neural foraminal encroachments by uncovertebral spurs as above. No acute cervical spine abnormalities. Electronically Signed   By: Ulyses SouthwardMark  Boles M.D.   On: 08/25/2017 23:49    Procedures Procedures (including critical care time)  Medications Ordered in ED Medications  thiamine (B-1) injection 100 mg (100 mg Intravenous Given 08/25/17 2322)  sodium chloride 0.9 % bolus 1,000 mL (0 mLs Intravenous Stopped 08/26/17 0019)  phenytoin (DILANTIN) 1,270 mg in sodium chloride 0.9 % 250 mL IVPB (0 mg Intravenous Stopped 08/26/17 0225)  Initial Impression / Assessment and Plan / ED Course  I have reviewed the triage vital signs and the nursing notes.  Pertinent labs & imaging results that were available during my care of the patient were reviewed by me and considered in my medical decision making (see chart for details).     Patient resents to the ED for evaluation of witnessed seizure-like activity.  Patient has a history of seizures and is noncompliant with his Dilantin.  Patient is homeless and does not follow-up with neurology.  Patient also reports heavy EtOH abuse.  Patient is well-known to the ED with same symptoms and complaints.  Patient reports pain to the back of his head and neck from falling after seizure.  Patient denies any other symptoms at this time.  Patient has no focal neuro deficit on exam.  No signs of intracranial, intrathoracic or intra-abdominal trauma.  Vital signs are reassuring including patient is afebrile without any  tachycardia or hypoxia.  Patient's Dilantin level is undetectable.  Ethanol is elevated at 351.  No indication for further lab work at this time.  CT imaging of head and neck were performed given patient's fall and alcohol intoxication.  These were reassuring with only chronic findings.  Patient does not appear to be in withdrawals at this time.  Patient loaded with Dilantin after discussion with pharmacy.  Patient has ambulated to the bathroom with normal gait.  Tolerating p.o. fluids without any difficulties.  I discussed the importance of taking his Dilantin and following up with his primary care doctor.   Pt is hemodynamically stable, in NAD, & able to ambulate in the ED. Evaluation does not show pathology that would require ongoing emergent intervention or inpatient treatment. I explained the diagnosis to the patient. Pain has been managed & has no complaints prior to dc. Pt is comfortable with above plan and is stable for discharge at this time. All questions were answered prior to disposition. Strict return precautions for f/u to the ED were discussed. Encouraged follow up with PCP.   Final Clinical Impressions(s) / ED Diagnoses   Final diagnoses:  Seizure (HCC)  Alcoholic intoxication without complication [facility])    ED Discharge Orders        Ordered    phenytoin (DILANTIN) 100 MG ER capsule  3 times daily     08/26/17 0354       Rise Mu, PA-C 08/26/17 4098    Maia Plan, MD 08/26/17 1134

## 2017-08-26 LAB — ETHANOL: ALCOHOL ETHYL (B): 351 mg/dL — AB (ref <10)

## 2017-08-26 LAB — PHENYTOIN LEVEL, TOTAL: Phenytoin Lvl: <2.5 ug/mL — ABNORMAL LOW (ref 10.0–20.0)

## 2017-08-26 MED ORDER — SODIUM CHLORIDE 0.9 % IV SOLN
1270.0000 mg | Freq: Once | INTRAVENOUS | Status: AC
  Administered 2017-08-26: 1270 mg via INTRAVENOUS
  Filled 2017-08-26: qty 25.4

## 2017-08-26 MED ORDER — PHENYTOIN SODIUM EXTENDED 100 MG PO CAPS: 100.0000 mg | ORAL_CAPSULE | Freq: Three times a day (TID) | ORAL | 0 refills | Status: DC

## 2017-08-26 NOTE — ED Notes (Addendum)
Pt ambulated to bathroom on otherside of ED with no assistance, tolerating well. PA and MD aware.

## 2017-08-26 NOTE — Discharge Instructions (Addendum)
You need to start taking your seizure medication as this will help prevent seizures.  Please follow-up with your primary care doctor.

## 2017-09-22 ENCOUNTER — Encounter: Payer: Self-pay | Admitting: Emergency Medicine

## 2017-09-22 ENCOUNTER — Other Ambulatory Visit: Payer: Self-pay

## 2017-09-22 ENCOUNTER — Inpatient Hospital Stay
Admission: EM | Admit: 2017-09-22 | Discharge: 2017-09-24 | DRG: 897 | Disposition: A | Discharge status: Home / Self Care | Payer: Medicaid Other | Attending: Internal Medicine | Admitting: Internal Medicine

## 2017-09-22 DIAGNOSIS — F329 Major depressive disorder, single episode, unspecified: Secondary | ICD-10-CM | POA: Diagnosis present

## 2017-09-22 DIAGNOSIS — I159 Secondary hypertension, unspecified: Secondary | ICD-10-CM | POA: Diagnosis present

## 2017-09-22 DIAGNOSIS — Z885 Allergy status to narcotic agent status: Secondary | ICD-10-CM

## 2017-09-22 DIAGNOSIS — F10939 Alcohol use, unspecified with withdrawal, unspecified: Secondary | ICD-10-CM | POA: Diagnosis present

## 2017-09-22 DIAGNOSIS — Z79899 Other long term (current) drug therapy: Secondary | ICD-10-CM

## 2017-09-22 DIAGNOSIS — D6959 Other secondary thrombocytopenia: Secondary | ICD-10-CM | POA: Diagnosis present

## 2017-09-22 DIAGNOSIS — J449 Chronic obstructive pulmonary disease, unspecified: Secondary | ICD-10-CM | POA: Diagnosis present

## 2017-09-22 DIAGNOSIS — Z9114 Patient's other noncompliance with medication regimen: Secondary | ICD-10-CM

## 2017-09-22 DIAGNOSIS — G629 Polyneuropathy, unspecified: Secondary | ICD-10-CM | POA: Diagnosis present

## 2017-09-22 DIAGNOSIS — F17210 Nicotine dependence, cigarettes, uncomplicated: Secondary | ICD-10-CM | POA: Diagnosis present

## 2017-09-22 DIAGNOSIS — G40909 Epilepsy, unspecified, not intractable, without status epilepticus: Secondary | ICD-10-CM | POA: Diagnosis present

## 2017-09-22 DIAGNOSIS — F10239 Alcohol dependence with withdrawal, unspecified: Principal | ICD-10-CM | POA: Diagnosis present

## 2017-09-22 MED ORDER — VITAMIN B-1 100 MG PO TABS
ORAL_TABLET | ORAL | Status: AC
  Filled 2017-09-22: qty 1

## 2017-09-22 MED ORDER — THIAMINE HCL 100 MG/ML IJ SOLN
100.0000 mg | Freq: Every day | INTRAMUSCULAR | Status: DC
  Filled 2017-09-22: qty 1

## 2017-09-22 MED ORDER — LORAZEPAM 2 MG/ML IJ SOLN
0.0000 mg | Freq: Four times a day (QID) | INTRAMUSCULAR | Status: DC
  Administered 2017-09-23: 2 mg via INTRAVENOUS
  Filled 2017-09-22: qty 2

## 2017-09-22 MED ORDER — THIAMINE HCL 100 MG/ML IJ SOLN
Freq: Once | INTRAVENOUS | Status: AC
  Administered 2017-09-23: 01:00:00 via INTRAVENOUS
  Filled 2017-09-22: qty 1000

## 2017-09-22 MED ORDER — SODIUM CHLORIDE 0.9 % IV BOLUS
1000.0000 mL | Freq: Once | INTRAVENOUS | Status: AC
  Administered 2017-09-23: 1000 mL via INTRAVENOUS

## 2017-09-22 NOTE — ED Provider Notes (Signed)
[facility] Emergency Department Provider Note   ____________________________________________   First MD Initiated Contact with Patient 09/22/17 2357     (approximate)  I have reviewed the triage vital signs and the nursing notes.   HISTORY  Chief Complaint Withdrawal    HPI Rodney Watson is a 52 y.o. male brought to the ED from jail with a chief complaint of alcohol withdrawal.  Patient is a daily drinker "as much as I can get my hands on" and has been in jail for the past 2 days.  Also states he has epilepsy and has been out of his Dilantin for the past couple of weeks.  Reports a history of DTs.  Presents with tremors and dry heaves.  Denies chest pain, shortness of breath, abdominal pain, vomiting, diarrhea.  Denies recent travel or trauma.   Past Medical History:  Diagnosis Date  . Alcohol abuse   . COPD (chronic obstructive pulmonary disease) (HCC)   . Depression   . ETOH abuse   . Seizures [facility])     Patient Active Problem List   Diagnosis Date Noted  . Alcohol withdrawal (HCC) 07/18/2017  . Seizure disorder (HCC) 07/18/2017  . Essential hypertension 07/18/2017  . 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 MDD (major depressive disorder), recurrent episode, severe (HCC) 04/13/2017  . Alcohol abuse with alcohol-induced mood disorder (HCC) 04/10/2017  . Alcohol abuse 04/01/2017    History reviewed. No pertinent surgical history.  Prior to Admission medications   Medication Sig Start Date End Date Taking? Authorizing Provider  FLUoxetine (PROZAC) 20 MG capsule Take 1 capsule (20 mg total) by mouth daily. Patient not taking: Reported on 08/26/2017 07/19/17   Calvert Cantor, MD  gabapentin (NEURONTIN) 300 MG capsule Take 1 capsule (300 mg total) by mouth 3 (three) times daily. Patient not taking: Reported on 08/26/2017 07/19/17   Calvert Cantor, MD  lisinopril (PRINIVIL,ZESTRIL) 10 MG tablet Take 1  tablet (10 mg total) by mouth daily. For high blood pressure 07/19/17   Calvert Cantor, MD  phenytoin (DILANTIN) 100 MG ER capsule Take 1 capsule (100 mg total) by mouth 3 (three) times daily. 08/26/17   Rise Mu, PA-C  traZODone (DESYREL) 100 MG tablet Take 1 tablet (100 mg total) by mouth at bedtime and may repeat dose one time if needed. 07/19/17   Calvert Cantor, MD    Allergies Chocolate and Vicodin [hydrocodone-acetaminophen]  Family History  Problem Relation Age of Onset  . Alcohol abuse Father   . Seizures Neg Hx     Social History Social History   Tobacco Use  . Smoking status: Current Every Day Smoker    Packs/day: 1.50    Years: 39.00    Pack years: 58.50    Types: Cigarettes  . Smokeless tobacco: Current User    Types: Chew  Substance Use Topics  . Alcohol use: Yes    Comment: 5th daily  . Drug use: Yes    Types: Marijuana    Comment: 1-2 joints/week    Review of Systems  Constitutional: No fever/chills Eyes: No visual changes. ENT: No sore throat. Cardiovascular: Denies chest pain. Respiratory: Denies shortness of breath. Gastrointestinal: No abdominal pain.  Positive for nausea, no vomiting.  No diarrhea.  No constipation. Genitourinary: Negative for dysuria. Musculoskeletal: Negative for back pain. Skin: Negative for rash. Neurological: Positive for tremors.  Negative for headaches, focal weakness or numbness.   ____________________________________________   PHYSICAL EXAM:  VITAL SIGNS:  ED Triage Vitals  Enc Vitals Group     BP 09/22/17 2325 (!) 177/104     Pulse Rate 09/22/17 2325 71     Resp 09/22/17 2325 19     Temp 09/22/17 2325 98.9 F (37.2 C)     Temp Source 09/22/17 2325 Oral     SpO2 09/22/17 2325 94 %     Weight 09/22/17 2327 140 lb (63.5 kg)     Height 09/22/17 2327 5\' 8"  (1.727 m)     Head Circumference --      Peak Flow --      Pain Score 09/22/17 2326 0     Pain Loc --      Pain Edu? --      Excl. in GC? --      Constitutional: Alert and oriented.  Disheveled appearing and in moderate acute distress. Eyes: Conjunctivae are normal. PERRL. EOMI. Head: Scabbed over abrasion to right cheek. Nose: No congestion/rhinnorhea. Mouth/Throat: Mucous membranes are moist.  Oropharynx non-erythematous. Neck: No stridor.  Supple neck without meningismus.  No carotid bruits. Cardiovascular: Normal rate, regular rhythm. Grossly normal heart sounds.  Good peripheral circulation. Respiratory: Normal respiratory effort.  No retractions. Lungs CTAB. Gastrointestinal: Soft and nontender to light or deep palpation. No distention. No abdominal bruits. No CVA tenderness. Musculoskeletal: No lower extremity tenderness nor edema.  No joint effusions. Neurologic: Alert and oriented x3.  Tremulous.  Asterixis noted.  Normal speech and language. No gross focal neurologic deficits are appreciated. MAEx4. Skin:  Skin is warm, clammy and intact. No rash noted. Psychiatric: Mood and affect are normal. Speech and behavior are normal.  ____________________________________________   LABS (all labs ordered are listed, but only abnormal results are displayed)  Labs Reviewed  CBC WITH DIFFERENTIAL/PLATELET - Abnormal; Notable for the following components:      Result Value   RDW 16.7 (*)    Platelets 74 (*)    Lymphs Abs 0.7 (*)    All other components within normal limits  COMPREHENSIVE METABOLIC PANEL - Abnormal; Notable for the following components:   Creatinine, Ser 0.57 (*)    AST 76 (*)    ALT 45 (*)    All other components within normal limits  ETHANOL - Abnormal; Notable for the following components:   Alcohol, Ethyl (B) 10 (*)    All other components within normal limits  PHENYTOIN LEVEL, TOTAL - Abnormal; Notable for the following components:   Phenytoin Lvl <2.5 (*)    All other components within normal limits  LIPASE, BLOOD  URINE DRUG SCREEN, QUALITATIVE ([facility] ONLY)    ____________________________________________  EKG  ED ECG REPORT I, Vung Kush J, the attending physician, personally viewed and interpreted this ECG.   Date: 09/23/2017  EKG Time: 0026  Rate: 75  Rhythm: normal EKG, normal sinus rhythm  Axis: Normal  Intervals:none  ST&T Change: Nonspecific  ____________________________________________  RADIOLOGY  ED MD interpretation: No acute cardiopulmonary process  Official radiology report(s): Dg Chest Port 1 View  Result Date: 09/23/2017 CLINICAL DATA:  Acute onset of dry heaves and clamminess. EXAM: PORTABLE CHEST 1 VIEW COMPARISON:  Chest radiograph performed 07/17/2017 FINDINGS: The lungs are well-aerated and clear. There is no evidence of focal opacification, pleural effusion or pneumothorax. The cardiomediastinal silhouette is within normal limits. No acute osseous abnormalities are seen. IMPRESSION: No acute cardiopulmonary process seen. Electronically Signed   By: Roanna Raider M.D.   On: 09/23/2017 00:44    ____________________________________________   PROCEDURES  Procedure(s) performed:  None  Procedures  Critical Care performed: Yes, see critical care note(s)   CRITICAL CARE Performed by: Irean HongSUNG,Anarosa Kubisiak J   Total critical care time: 30 minutes  Critical care time was exclusive of separately billable procedures and treating other patients.  Critical care was necessary to treat or prevent imminent or life-threatening deterioration.  Critical care was time spent personally by me on the following activities: development of treatment plan with patient and/or surrogate as well as nursing, discussions with consultants, evaluation of patient's response to treatment, examination of patient, obtaining history from patient or surrogate, ordering and performing treatments and interventions, ordering and review of laboratory studies, ordering and review of radiographic studies, pulse oximetry and re-evaluation of patient's  condition.  ____________________________________________   INITIAL IMPRESSION / ASSESSMENT AND PLAN / ED COURSE  As part of my medical decision making, I reviewed the following data within the electronic MEDICAL RECORD NUMBER Nursing notes reviewed and incorporated, Labs reviewed, EKG interpreted, Old chart reviewed, Radiograph reviewed, Discussed with admitting physician and Notes from prior ED visits   52 year old alcoholic brought from jail with alcohol withdrawal symptoms.  Will initiate IV fluids, banana bag, CIWA protocol and reassess.  Anticipate hospitalization.  Will also give 1 g fosphenytoin as patient has been out of his Dilantin for the past couple weeks.  Clinical Course as of Sep 24 131  Mon Sep 23, 2017  0132 Updated patient on all test results.  Scores a 21 on the CIWA scale.  Will discuss with hospitalist to evaluate patient in the emergency department for admission.   [JS]    Clinical Course User Index [JS] Irean HongSung, Leonilda Cozby J, MD     ____________________________________________   FINAL CLINICAL IMPRESSION(S) / ED DIAGNOSES  Final diagnoses:  Alcohol withdrawal syndrome with complication [facility](HCC)  Secondary hypertension     ED Discharge Orders    None       Note:  This document was prepared using Dragon voice recognition software and may include unintentional dictation errors.    Irean HongSung, Jonita Hirota J, MD 09/23/17 [phone]

## 2017-09-22 NOTE — ED Triage Notes (Addendum)
[facility]'s going through withdrawal; pt is normally a daily drinker "as much as I can get my hands on" and has been in jail x 2 days; pt with dry heaves in triage; skin is clammy; pt has history of epilepsy as well as seizures with withdrawing

## 2017-09-23 ENCOUNTER — Emergency Department: Payer: Medicaid Other

## 2017-09-23 ENCOUNTER — Other Ambulatory Visit: Payer: Self-pay

## 2017-09-23 ENCOUNTER — Encounter: Payer: Self-pay | Admitting: Internal Medicine

## 2017-09-23 DIAGNOSIS — Z9114 Patient's other noncompliance with medication regimen: Secondary | ICD-10-CM | POA: Diagnosis not present

## 2017-09-23 DIAGNOSIS — F10239 Alcohol dependence with withdrawal, unspecified: Secondary | ICD-10-CM | POA: Diagnosis present

## 2017-09-23 DIAGNOSIS — F17210 Nicotine dependence, cigarettes, uncomplicated: Secondary | ICD-10-CM | POA: Diagnosis present

## 2017-09-23 DIAGNOSIS — G629 Polyneuropathy, unspecified: Secondary | ICD-10-CM | POA: Diagnosis present

## 2017-09-23 DIAGNOSIS — Z885 Allergy status to narcotic agent status: Secondary | ICD-10-CM | POA: Diagnosis not present

## 2017-09-23 DIAGNOSIS — G40909 Epilepsy, unspecified, not intractable, without status epilepticus: Secondary | ICD-10-CM | POA: Diagnosis present

## 2017-09-23 DIAGNOSIS — F329 Major depressive disorder, single episode, unspecified: Secondary | ICD-10-CM | POA: Diagnosis present

## 2017-09-23 DIAGNOSIS — I159 Secondary hypertension, unspecified: Secondary | ICD-10-CM | POA: Diagnosis present

## 2017-09-23 DIAGNOSIS — Z79899 Other long term (current) drug therapy: Secondary | ICD-10-CM | POA: Diagnosis not present

## 2017-09-23 DIAGNOSIS — D6959 Other secondary thrombocytopenia: Secondary | ICD-10-CM | POA: Diagnosis present

## 2017-09-23 DIAGNOSIS — J449 Chronic obstructive pulmonary disease, unspecified: Secondary | ICD-10-CM | POA: Diagnosis present

## 2017-09-23 LAB — CBC WITH DIFFERENTIAL/PLATELET
BASOS ABS: 0 10*3/uL (ref 0–0.1)
BASOS PCT: 1 %
Eosinophils Absolute: 0 10*3/uL (ref 0–0.7)
Eosinophils Relative: 1 %
HEMATOCRIT: 45.5 % (ref 40.0–52.0)
HEMOGLOBIN: 15.4 g/dL (ref 13.0–18.0)
Lymphocytes Relative: 14 %
Lymphs Abs: 0.7 10*3/uL — ABNORMAL LOW (ref 1.0–3.6)
MCH: 31.4 pg (ref 26.0–34.0)
MCHC: 33.9 g/dL (ref 32.0–36.0)
MCV: 92.7 fL (ref 80.0–100.0)
MONO ABS: 0.5 10*3/uL (ref 0.2–1.0)
MONOS PCT: 11 %
NEUTROS ABS: 3.7 10*3/uL (ref 1.4–6.5)
Neutrophils Relative %: 73 %
Platelets: 74 10*3/uL — ABNORMAL LOW (ref 150–440)
RBC: 4.9 MIL/uL (ref 4.40–5.90)
RDW: 16.7 % — AB (ref 11.5–14.5)
WBC: 5 10*3/uL (ref 3.8–10.6)

## 2017-09-23 LAB — COMPREHENSIVE METABOLIC PANEL
ALBUMIN: 4.6 g/dL (ref 3.5–5.0)
ALK PHOS: 73 U/L (ref 38–126)
ALT: 45 U/L — AB (ref 0–44)
AST: 76 U/L — AB (ref 15–41)
Anion gap: 13 (ref 5–15)
BILIRUBIN TOTAL: 1.2 mg/dL (ref 0.3–1.2)
BUN: 6 mg/dL (ref 6–20)
CALCIUM: 9.3 mg/dL (ref 8.9–10.3)
CO2: 28 mmol/L (ref 22–32)
CREATININE: 0.57 mg/dL — AB (ref 0.61–1.24)
Chloride: 99 mmol/L (ref 98–111)
GFR calc Af Amer: >60 mL/min (ref >60)
GLUCOSE: 96 mg/dL (ref 70–99)
POTASSIUM: 3.9 mmol/L (ref 3.5–5.1)
Sodium: 140 mmol/L (ref 135–145)
TOTAL PROTEIN: 7.8 g/dL (ref 6.5–8.1)

## 2017-09-23 LAB — URINE DRUG SCREEN, QUALITATIVE (ARMC ONLY)
Amphetamines, Ur Screen: NOT DETECTED
BARBITURATES, UR SCREEN: NOT DETECTED
CANNABINOID 50 NG, UR ~~LOC~~: POSITIVE — AB
COCAINE METABOLITE, UR ~~LOC~~: NOT DETECTED
MDMA (Ecstasy)Ur Screen: NOT DETECTED
METHADONE SCREEN, URINE: NOT DETECTED
OPIATE, UR SCREEN: NOT DETECTED
PHENCYCLIDINE (PCP) UR S: NOT DETECTED
Tricyclic, Ur Screen: NOT DETECTED

## 2017-09-23 LAB — LIPASE, BLOOD: Lipase: 27 U/L (ref 11–51)

## 2017-09-23 LAB — PHENYTOIN LEVEL, TOTAL: Phenytoin Lvl: <2.5 ug/mL — ABNORMAL LOW (ref 10.0–20.0)

## 2017-09-23 LAB — ETHANOL: ALCOHOL ETHYL (B): 10 mg/dL — AB (ref <10)

## 2017-09-23 LAB — MAGNESIUM: Magnesium: 1.8 mg/dL (ref 1.7–2.4)

## 2017-09-23 LAB — PHOSPHORUS: PHOSPHORUS: 3.6 mg/dL (ref 2.5–4.6)

## 2017-09-23 LAB — MRSA PCR SCREENING: MRSA by PCR: NEGATIVE

## 2017-09-23 MED ORDER — FLUOXETINE HCL 20 MG PO CAPS
20.0000 mg | ORAL_CAPSULE | Freq: Every day | ORAL | Status: DC
  Administered 2017-09-23 – 2017-09-24 (×2): 20 mg via ORAL
  Filled 2017-09-23 (×2): qty 1

## 2017-09-23 MED ORDER — PHENYTOIN SODIUM EXTENDED 100 MG PO CAPS
100.0000 mg | ORAL_CAPSULE | Freq: Three times a day (TID) | ORAL | Status: DC
  Administered 2017-09-23 – 2017-09-24 (×4): 100 mg via ORAL
  Filled 2017-09-23 (×6): qty 1

## 2017-09-23 MED ORDER — GABAPENTIN 300 MG PO CAPS
300.0000 mg | ORAL_CAPSULE | Freq: Three times a day (TID) | ORAL | Status: DC
  Administered 2017-09-23 – 2017-09-24 (×4): 300 mg via ORAL
  Filled 2017-09-23 (×4): qty 1

## 2017-09-23 MED ORDER — ONDANSETRON HCL 4 MG PO TABS: 4.0000 mg | ORAL_TABLET | Freq: Four times a day (QID) | ORAL | Status: DC | PRN

## 2017-09-23 MED ORDER — SODIUM CHLORIDE 0.9 % IV SOLN
1000.0000 mg | Freq: Once | INTRAVENOUS | Status: DC
  Filled 2017-09-23: qty 20

## 2017-09-23 MED ORDER — FOLIC ACID 1 MG PO TABS
1.0000 mg | ORAL_TABLET | Freq: Every day | ORAL | Status: DC
  Administered 2017-09-23 – 2017-09-24 (×2): 1 mg via ORAL
  Filled 2017-09-23 (×2): qty 1

## 2017-09-23 MED ORDER — LACTATED RINGERS IV SOLN
INTRAVENOUS | Status: AC
  Administered 2017-09-23: 03:00:00 via INTRAVENOUS

## 2017-09-23 MED ORDER — LISINOPRIL 10 MG PO TABS
10.0000 mg | ORAL_TABLET | Freq: Every day | ORAL | Status: DC
  Administered 2017-09-23 – 2017-09-24 (×2): 10 mg via ORAL
  Filled 2017-09-23 (×2): qty 1

## 2017-09-23 MED ORDER — LORAZEPAM 2 MG/ML IJ SOLN: 0.0000 mg | Freq: Two times a day (BID) | INTRAMUSCULAR | Status: DC

## 2017-09-23 MED ORDER — ADULT MULTIVITAMIN W/MINERALS CH
1.0000 | ORAL_TABLET | Freq: Every day | ORAL | Status: DC
  Administered 2017-09-23 – 2017-09-24 (×2): 1 via ORAL
  Filled 2017-09-23 (×2): qty 1

## 2017-09-23 MED ORDER — DIAZEPAM 5 MG PO TABS
5.0000 mg | ORAL_TABLET | Freq: Four times a day (QID) | ORAL | Status: DC | PRN
  Administered 2017-09-23: 23:00:00 10 mg via ORAL
  Administered 2017-09-23: 5 mg via ORAL
  Administered 2017-09-23: 05:00:00 10 mg via ORAL
  Filled 2017-09-23: qty 1
  Filled 2017-09-23 (×2): qty 2

## 2017-09-23 MED ORDER — LORAZEPAM 1 MG PO TABS
1.0000 mg | ORAL_TABLET | Freq: Four times a day (QID) | ORAL | Status: DC | PRN
  Administered 2017-09-23: 20:00:00 1 mg via ORAL
  Filled 2017-09-23: qty 1

## 2017-09-23 MED ORDER — THIAMINE HCL 100 MG/ML IJ SOLN
INTRAMUSCULAR | Status: AC
  Filled 2017-09-23: qty 2

## 2017-09-23 MED ORDER — BISACODYL 5 MG PO TBEC: 5.0000 mg | DELAYED_RELEASE_TABLET | Freq: Every day | ORAL | Status: DC | PRN

## 2017-09-23 MED ORDER — FOSPHENYTOIN SODIUM 500 MG PE/10ML IJ SOLN
700.0000 mg | Freq: Once | INTRAMUSCULAR | Status: AC
  Administered 2017-09-23: 700 mg via INTRAVENOUS
  Filled 2017-09-23: qty 14

## 2017-09-23 MED ORDER — ONDANSETRON HCL 4 MG/2ML IJ SOLN: 4.0000 mg | Freq: Four times a day (QID) | INTRAMUSCULAR | Status: DC | PRN

## 2017-09-23 MED ORDER — PHENYTOIN SODIUM EXTENDED 100 MG PO CAPS
300.0000 mg | ORAL_CAPSULE | Freq: Once | ORAL | Status: AC
  Administered 2017-09-23: 300 mg via ORAL
  Filled 2017-09-23: qty 3

## 2017-09-23 MED ORDER — LORAZEPAM 2 MG/ML IJ SOLN: 1.0000 mg | Freq: Four times a day (QID) | INTRAMUSCULAR | Status: DC | PRN

## 2017-09-23 MED ORDER — CHLORDIAZEPOXIDE HCL 5 MG PO CAPS
10.0000 mg | ORAL_CAPSULE | Freq: Three times a day (TID) | ORAL | Status: DC
  Administered 2017-09-23 – 2017-09-24 (×3): 10 mg via ORAL
  Filled 2017-09-23 (×4): qty 2

## 2017-09-23 MED ORDER — THIAMINE HCL 100 MG/ML IJ SOLN
100.0000 mg | Freq: Every day | INTRAMUSCULAR | Status: DC
  Filled 2017-09-23 (×2): qty 1

## 2017-09-23 MED ORDER — SENNOSIDES-DOCUSATE SODIUM 8.6-50 MG PO TABS
1.0000 | ORAL_TABLET | Freq: Every evening | ORAL | Status: DC | PRN
Start: 2017-09-23 — End: 2017-09-24

## 2017-09-23 MED ORDER — VITAMIN B-1 100 MG PO TABS
100.0000 mg | ORAL_TABLET | Freq: Every day | ORAL | Status: DC
  Administered 2017-09-23 – 2017-09-24 (×2): 100 mg via ORAL
  Filled 2017-09-23 (×2): qty 1

## 2017-09-23 MED ORDER — TRAZODONE HCL 50 MG PO TABS
100.0000 mg | ORAL_TABLET | Freq: Every evening | ORAL | Status: DC | PRN
Start: 2017-09-23 — End: 2017-09-24
  Administered 2017-09-23: 23:00:00 100 mg via ORAL
  Filled 2017-09-23: qty 2

## 2017-09-23 MED ORDER — LORAZEPAM 2 MG/ML IJ SOLN
0.0000 mg | Freq: Four times a day (QID) | INTRAMUSCULAR | Status: DC
  Administered 2017-09-23: 03:00:00 2 mg via INTRAVENOUS
  Filled 2017-09-23: qty 1

## 2017-09-23 MED ORDER — ENOXAPARIN SODIUM 40 MG/0.4ML ~~LOC~~ SOLN
40.0000 mg | SUBCUTANEOUS | Status: DC
  Administered 2017-09-23 (×2): 40 mg via SUBCUTANEOUS
  Filled 2017-09-23 (×3): qty 0.4

## 2017-09-23 NOTE — Progress Notes (Signed)
[facility] - [place] at [facility]   PATIENT NAME: Rodney Watson    MR#:  [id number]  DATE OF BIRTH:  10-02-1965  SUBJECTIVE:  CHIEF COMPLAINT:   Chief Complaint  Patient presents with  . Withdrawal   -Admitted for alcohol withdrawal symptoms.  Still has significant tremors.  No seizures this admission   REVIEW OF SYSTEMS:  Review of Systems  Constitutional: Negative for chills, fever and malaise/fatigue.  HENT: Negative for congestion, ear discharge, hearing loss and nosebleeds.   Eyes: Negative for blurred vision and double vision.  Respiratory: Negative for cough, shortness of breath and wheezing.   Cardiovascular: Negative for chest pain and palpitations.  Gastrointestinal: Negative for abdominal pain, constipation, diarrhea, nausea and vomiting.  Genitourinary: Negative for dysuria.  Musculoskeletal: Negative for myalgias.  Neurological: Positive for dizziness and tremors. Negative for seizures and headaches.  Psychiatric/Behavioral: Negative for depression.    DRUG ALLERGIES:   Allergies  Allergen Reactions  . Chocolate Hives  . Vicodin [Hydrocodone-Acetaminophen] Nausea And Vomiting    VITALS:  Blood pressure (!) 148/74, pulse 76, temperature 98.4 F (36.9 C), resp. rate 20, height 5\' 8"  (1.727 m), weight 65.2 kg (143 lb 12.8 oz), SpO2 98 %.  PHYSICAL EXAMINATION:  Physical Exam  GENERAL:  51 y.o.-year-old patient lying in the bed with no acute distress.  EYES: Pupils equal, round, reactive to light and accommodation. No scleral icterus. Extraocular muscles intact.  HEENT: Head atraumatic, normocephalic. Oropharynx and nasopharynx clear.  NECK:  Supple, no jugular venous distention. No thyroid enlargement, no tenderness.  LUNGS: Normal breath sounds bilaterally, no wheezing, rales,rhonchi or crepitation. No use of accessory muscles of respiration.  CARDIOVASCULAR: S1, S2 normal. No murmurs, rubs, or gallops.  ABDOMEN: Soft, nontender,  nondistended. Bowel sounds present. No organomegaly or mass.  EXTREMITIES: No pedal edema, cyanosis, or clubbing. Tremors of both upper extremities noted. NEUROLOGIC: Cranial nerves II through XII are intact. Muscle strength 5/5 in all extremities. Sensation intact. Gait not checked.  PSYCHIATRIC: The patient is alert and oriented x 3. No hallucinations SKIN: No obvious rash, lesion, or ulcer.    LABORATORY PANEL:   CBC Recent Labs  Lab 09/22/17 0000  WBC 5.0  HGB 15.4  HCT 45.5  PLT 74*   ------------------------------------------------------------------------------------------------------------------  Chemistries  Recent Labs  Lab 09/22/17 0000 09/23/17 0000  NA 140  --   K 3.9  --   CL 99  --   CO2 28  --   GLUCOSE 96  --   BUN 6  --   CREATININE 0.57*  --   CALCIUM 9.3  --   MG  --  1.8  AST 76*  --   ALT 45*  --   ALKPHOS 73  --   BILITOT 1.2  --    ------------------------------------------------------------------------------------------------------------------  Cardiac Enzymes No results for input(s): TROPONINI in the last 168 hours. ------------------------------------------------------------------------------------------------------------------  RADIOLOGY:  Dg Chest Port 1 View  Result Date: 09/23/2017 CLINICAL DATA:  Acute onset of dry heaves and clamminess. EXAM: PORTABLE CHEST 1 VIEW COMPARISON:  Chest radiograph performed 07/17/2017 FINDINGS: The lungs are well-aerated and clear. There is no evidence of focal opacification, pleural effusion or pneumothorax. The cardiomediastinal silhouette is within normal limits. No acute osseous abnormalities are seen. IMPRESSION: No acute cardiopulmonary process seen. Electronically Signed   By: Roanna Raider M.D.   On: 09/23/2017 00:44    EKG:   Orders placed or performed during the hospital encounter of 09/22/17  . ED  EKG  . ED EKG  . EKG 12-Lead  . EKG 12-Lead    ASSESSMENT AND PLAN:   52 year old male  with past medical history significant for alcohol abuse, history of seizure disorder noncompliant with his medications brought in secondary to withdrawal symptoms  1.  Alcohol withdrawal symptoms-started on scheduled Librium.  Continue Ativan per CIWA protocol -Watch for any fall precautions -on Thiamine and folic acid.  2.  History of seizure disorder-noncompliant with his Dilantin. -Was loaded with fosphenytoin.  Continue home Dilantin levels  3.  Elevated LFTs-secondary to alcoholic use.  Monitor.  Bilirubin is within normal limits  4.  Depression-on Prozac  5.  Peripheral neuropathy-on gabapentin  6.  DVT Prophylaxis-on Lovenox   All the records are reviewed and case discussed with Care Management/Social Workerr. Management plans discussed with the patient, family and they are in agreement.  CODE STATUS: Full Code  TOTAL TIME TAKING CARE OF THIS PATIENT: 38 minutes.   POSSIBLE D/C IN 1-2 DAYS, DEPENDING ON CLINICAL CONDITION.   Vernida Mcnicholas M.D on 09/23/2017 at 4:02 PM  Between 7am to 6pm - Pager - [phone]  After 6pm go to [email or web address] - Social research officer, governmentpassword EPAS [email or web address]  [facility] [place] [facility]  Office  [phone]  CC: Primary care physician; Hilbert CorriganPlacey, Chales AbrahamsMary Ann, NP

## 2017-09-23 NOTE — Plan of Care (Signed)

## 2017-09-23 NOTE — Care Management (Signed)
[facility] [place] [facility] [place] [facility]. He has scored as high as 19 on CIWA 8/5.  He has presented 15 times to the ED and 6 admissions.  He is homeless and without insurance.  He has no phone.  He has been "released by law enforcement."   No plans for where he will go at discharge.

## 2017-09-23 NOTE — H&P (Signed)
[facility] - [place] at [facility]   PATIENT NAME: Rodney Watson    MR#:  [id number]  DATE OF BIRTH:  Sep 17, 1965  DATE OF ADMISSION:  09/22/2017  PRIMARY CARE PHYSICIAN: Placey, Chales AbrahamsMary Ann, NP   REQUESTING/REFERRING PHYSICIAN: Irean HongSung, Jade J, MD  CHIEF COMPLAINT:   Chief Complaint  Patient presents with  . Withdrawal    HISTORY OF PRESENT ILLNESS:  Rodney Watson  is a 52 y.o. male with a known history of EtOH abuse/dependence, Sz D/O p/w EtOH withdrawal symptoms. Normally pt is a daily drinker, but has been in jail x2d. He started going through withdrawal while in jail. He states he has not had an appetite, and has had poor PO intake x2-3d. He endorses vomiting x1, followed by dry heaving (on Sunday 09/22/2017). He endorses diaphoresis and tremulousness. He denies palpitations.  He also states he had a seizure 4d ago, before he went to jail. He states he was walking down [address] [place], lost consciousness, [facility], and was told he had had a seizure. He left AMA. He states he has been off Dilantin x3.5wks because he could not afford it.  Pt well-appearing and not in any acute distress at the time of my assessment. He is accompanied by a Emergency planning/management officerpolice officer, and is handcuffed to the bed.  PAST MEDICAL HISTORY:   Past Medical History:  Diagnosis Date  . Alcohol abuse   . COPD (chronic obstructive pulmonary disease) (HCC)   . Depression   . ETOH abuse   . Seizures (HCC)     PAST SURGICAL HISTORY:  History reviewed. No pertinent surgical history.  SOCIAL HISTORY:   Social History   Tobacco Use  . Smoking status: Current Every Day Smoker    Packs/day: 1.50    Years: 39.00    Pack years: 58.50    Types: Cigarettes  . Smokeless tobacco: Current User    Types: Chew  Substance Use Topics  . Alcohol use: Yes    Comment: 5th daily    FAMILY HISTORY:   Family History  Problem Relation Age of Onset  . Alcohol abuse Father    . Seizures Neg Hx     DRUG ALLERGIES:   Allergies  Allergen Reactions  . Chocolate Hives  . Vicodin [Hydrocodone-Acetaminophen] Nausea And Vomiting    REVIEW OF SYSTEMS:   Review of Systems  Constitutional: Positive for diaphoresis. Negative for chills, fever, malaise/fatigue and weight loss.  HENT: Negative for congestion, ear pain, hearing loss, nosebleeds, sinus pain, sore throat and tinnitus.   Eyes: Negative for blurred vision, double vision and photophobia.  Respiratory: Negative for cough, hemoptysis, sputum production, shortness of breath and wheezing.   Cardiovascular: Negative for chest pain, palpitations, orthopnea, claudication, leg swelling and PND.  Gastrointestinal: Positive for nausea and vomiting. Negative for abdominal pain, blood in stool, constipation, diarrhea, heartburn and melena.  Genitourinary: Negative for dysuria, frequency, hematuria and urgency.  Musculoskeletal: Negative for back pain, joint pain, myalgias and neck pain.  Skin: Negative for itching and rash.  Neurological: Positive for tremors, seizures and loss of consciousness. Negative for dizziness, tingling, sensory change, speech change, focal weakness, weakness and headaches.  Psychiatric/Behavioral: Positive for hallucinations. Negative for depression, memory loss and suicidal ideas. The patient is nervous/anxious. The patient does not have insomnia.    MEDICATIONS AT HOME:   Prior to Admission medications   Medication Sig Start Date End Date Taking? Authorizing Provider  FLUoxetine (PROZAC) 20  MG capsule Take 1 capsule (20 mg total) by mouth daily. 07/19/17  Yes Calvert Cantor, MD  gabapentin (NEURONTIN) 300 MG capsule Take 1 capsule (300 mg total) by mouth 3 (three) times daily. 07/19/17  Yes Calvert Cantor, MD  lisinopril (PRINIVIL,ZESTRIL) 10 MG tablet Take 1 tablet (10 mg total) by mouth daily. For high blood pressure 07/19/17  Yes Rizwan, Ladell Heads, MD  phenytoin (DILANTIN) 100 MG ER capsule Take  1 capsule (100 mg total) by mouth 3 (three) times daily. 08/26/17  Yes Leaphart, Lynann Beaver, PA-C  traZODone (DESYREL) 100 MG tablet Take 1 tablet (100 mg total) by mouth at bedtime and may repeat dose one time if needed. 07/19/17  Yes Calvert Cantor, MD      VITAL SIGNS:  Blood pressure (!) 149/98, pulse 78, temperature 98.7 F (37.1 C), temperature source Oral, resp. rate 20, height 5\' 8"  (1.727 m), weight 65.2 kg (143 lb 12.8 oz), SpO2 100 %.  PHYSICAL EXAMINATION:  Physical Exam  Constitutional: He is oriented to person, place, and time. He appears well-developed and well-nourished. He is active and cooperative.  Non-toxic appearance. He does not have a sickly appearance. No distress. He is not intubated.  HENT:  Head: Normocephalic and atraumatic.  Mouth/Throat: Oropharynx is clear and moist. No oropharyngeal exudate.  Eyes: Conjunctivae, EOM and lids are normal. No scleral icterus.  Neck: Neck supple. No JVD present. No thyromegaly present.  Cardiovascular: Normal rate, regular rhythm, S1 normal, S2 normal and normal heart sounds.  No extrasystoles are present. Exam reveals no gallop, no S3, no S4, no distant heart sounds and no friction rub.  No murmur heard. Pulmonary/Chest: Effort normal. No accessory muscle usage or stridor. No apnea, no tachypnea and no bradypnea. He is not intubated. No respiratory distress. He has decreased breath sounds in the right upper field, the right middle field, the right lower field, the left upper field, the left middle field and the left lower field. He has no wheezes. He has no rhonchi. He has no rales.  Abdominal: Soft. Bowel sounds are normal. He exhibits no distension. There is no tenderness. There is no rebound and no guarding.  Musculoskeletal: Normal range of motion. He exhibits no edema or tenderness.  Lymphadenopathy:    He has no cervical adenopathy.  Neurological: He is alert and oriented to person, place, and time. He is not disoriented.  Skin:  Skin is warm, dry and intact. No rash noted. He is not diaphoretic. No erythema.  Psychiatric: His speech is normal and behavior is normal. Thought content normal. His mood appears anxious. He is actively hallucinating. Cognition and memory are normal. He expresses impulsivity.   LABORATORY PANEL:   CBC Recent Labs  Lab 09/22/17 0000  WBC 5.0  HGB 15.4  HCT 45.5  PLT 74*   ------------------------------------------------------------------------------------------------------------------  Chemistries  Recent Labs  Lab 09/22/17 0000 09/23/17 0000  NA 140  --   K 3.9  --   CL 99  --   CO2 28  --   GLUCOSE 96  --   BUN 6  --   CREATININE 0.57*  --   CALCIUM 9.3  --   MG  --  1.8  AST 76*  --   ALT 45*  --   ALKPHOS 73  --   BILITOT 1.2  --    ------------------------------------------------------------------------------------------------------------------  Cardiac Enzymes No results for input(s): TROPONINI in the last 168 hours. ------------------------------------------------------------------------------------------------------------------  RADIOLOGY:  Dg Chest Port 1 View  Result Date:  09/23/2017 CLINICAL DATA:  Acute onset of dry heaves and clamminess. EXAM: PORTABLE CHEST 1 VIEW COMPARISON:  Chest radiograph performed 07/17/2017 FINDINGS: The lungs are well-aerated and clear. There is no evidence of focal opacification, pleural effusion or pneumothorax. The cardiomediastinal silhouette is within normal limits. No acute osseous abnormalities are seen. IMPRESSION: No acute cardiopulmonary process seen. Electronically Signed   By: Roanna Raider M.D.   On: 09/23/2017 00:44   IMPRESSION AND PLAN:   A/P: 30M EtOH abuse/dependence w/ withdrawal. Recent seizure. Transaminasemia, thrombocytopenia. -CIWA 21 in ED -CIWA protocol, Ativan IV + Valium PO PRN -Thamine, folate, MVI -Seizure precautions, pt w/ Hx Sz -Dilantin level < 2.5 -Loaded w/ Fosphenytoin, c/w home  Dilantin -Transaminasemia and thrombocytopenia likely 2/2 EtOH use -No Tylenol -c/w home meds -Regular diet as tolerated -Lovenox -Full code -Admission, > 2 midnights   All the records are reviewed and case discussed with ED provider. Management plans discussed with the patient, family and they are in agreement.  CODE STATUS: Full code  TOTAL TIME TAKING CARE OF THIS PATIENT: 60 minutes.    Barbaraann Rondo M.D on 09/23/2017 at 5:31 AM  Between 7am to 6pm - Pager - [phone]  After 6pm go to [email or web address] - Social research officer, government  [facility] [place] [facility]  Office  [phone]  CC: Primary care physician; Hilbert Corrigan Chales Abrahams, NP   Note: This dictation was prepared with Dragon dictation along with smaller phrase technology. Any transcriptional errors that result from this process are unintentional.

## 2017-09-23 NOTE — ED Notes (Signed)
Pt ambulatory to toilet with standby assist. 

## 2017-09-23 NOTE — ED Notes (Signed)
Pharmacy notified to send Multivitamin IV infusion.

## 2017-09-23 NOTE — ED Notes (Signed)
Hospitalist to bedside at this time 

## 2017-09-24 LAB — BASIC METABOLIC PANEL
Anion gap: 9 (ref 5–15)
BUN: 15 mg/dL (ref 6–20)
CALCIUM: 8.9 mg/dL (ref 8.9–10.3)
CO2: 27 mmol/L (ref 22–32)
CREATININE: 0.75 mg/dL (ref 0.61–1.24)
Chloride: 103 mmol/L (ref 98–111)
GFR calc non Af Amer: >60 mL/min (ref >60)
Glucose, Bld: 84 mg/dL (ref 70–99)
Potassium: 3.5 mmol/L (ref 3.5–5.1)
SODIUM: 139 mmol/L (ref 135–145)

## 2017-09-24 LAB — CBC
HCT: 43.5 % (ref 40.0–52.0)
Hemoglobin: 14.6 g/dL (ref 13.0–18.0)
MCH: 31.4 pg (ref 26.0–34.0)
MCHC: 33.6 g/dL (ref 32.0–36.0)
MCV: 93.6 fL (ref 80.0–100.0)
Platelets: 55 K/uL — ABNORMAL LOW (ref 150–440)
RBC: 4.65 MIL/uL (ref 4.40–5.90)
RDW: 16.1 % — ABNORMAL HIGH (ref 11.5–14.5)
WBC: 3.9 K/uL (ref 3.8–10.6)

## 2017-09-24 MED ORDER — FOLIC ACID 1 MG PO TABS: 1.0000 mg | ORAL_TABLET | Freq: Every day | ORAL | 0 refills | Status: DC

## 2017-09-24 MED ORDER — PHENYTOIN SODIUM EXTENDED 100 MG PO CAPS: 100.0000 mg | ORAL_CAPSULE | Freq: Three times a day (TID) | ORAL | 1 refills | Status: DC

## 2017-09-24 MED ORDER — THIAMINE HCL 100 MG PO TABS
100.0000 mg | ORAL_TABLET | Freq: Every day | ORAL | 0 refills | Status: DC
Start: 2017-09-24 — End: 2017-10-29

## 2017-09-24 NOTE — Care Management Note (Signed)
Case Management Note  Patient Details  Name: Rodney Watson MRN: [id number] Date of Birth: 1965/07/26  Subjective/Objective:  Scripts sent to medication management.                   Action/Plan:   Expected Discharge Date:  09/24/17               Expected Discharge Plan:     In-House Referral:     Discharge planning Services  CM Consult, Medication Assistance  Post Acute Care Choice:    Choice offered to:     DME Arranged:    DME Agency:     HH Arranged:    HH Agency:     Status of Service:     If discussed at [employer] Meetings, dates discussed:    Additional Comments:  Virgel ManifoldJosh A Zahraa Bhargava, RN 09/24/2017, 10:39 AM

## 2017-09-24 NOTE — Discharge Summary (Signed)
[facility] - [place] at [facility]   PATIENT NAME: Rodney Watson    MR#:  [id number]  DATE OF BIRTH:  June 01, 1965  DATE OF ADMISSION:  09/22/2017   ADMITTING PHYSICIAN: Barbaraann Rondo, MD  DATE OF DISCHARGE: 09/24/2017 PRIMARY CARE PHYSICIAN: Lavinia Sharps, NP   ADMISSION DIAGNOSIS:  Secondary hypertension [I15.9] Alcohol withdrawal syndrome with complication (HCC) [F10.239] DISCHARGE DIAGNOSIS:  Active Problems:   Alcohol withdrawal (HCC)  SECONDARY DIAGNOSIS:   Past Medical History:  Diagnosis Date  . Alcohol abuse   . COPD (chronic obstructive pulmonary disease) (HCC)   . Depression   . ETOH abuse   . Seizures [facility])    HOSPITAL COURSE:  52 year old male with past medical history significant for alcohol abuse, history of seizure disorder noncompliant with his medications brought in secondary to withdrawal symptoms  1.  Alcohol withdrawal symptoms-started on scheduled Librium.  He is treated with Ativan per CIWA protocol -Watch for any fall precautions -on Thiamine and folic acid.  2.  History of seizure disorder-noncompliant with his Dilantin. -Was loaded with fosphenytoin.  Continue home Dilantin.  3.  Elevated LFTs-secondary to alcoholic use.  Monitor.  Bilirubin is within normal limits  4.  Depression-on Prozac  5.  Peripheral neuropathy-on gabapentin  6.  DVT Prophylaxis-on Lovenox DISCHARGE CONDITIONS:  Stable, discharge to today. CONSULTS OBTAINED:  Treatment Team:  Barbaraann Rondo, MD DRUG ALLERGIES:   Allergies  Allergen Reactions  . Chocolate Hives  . Vicodin [Hydrocodone-Acetaminophen] Nausea And Vomiting   DISCHARGE MEDICATIONS:   Allergies as of 09/24/2017      Reactions   Chocolate Hives   Vicodin [hydrocodone-acetaminophen] Nausea And Vomiting      Medication List    TAKE these medications   FLUoxetine 20 MG capsule Commonly known as:  PROZAC Take 1 capsule (20 mg total) by mouth daily.   folic  acid 1 MG tablet Commonly known as:  FOLVITE Take 1 tablet (1 mg total) by mouth daily.   gabapentin 300 MG capsule Commonly known as:  NEURONTIN Take 1 capsule (300 mg total) by mouth 3 (three) times daily.   lisinopril 10 MG tablet Commonly known as:  PRINIVIL,ZESTRIL Take 1 tablet (10 mg total) by mouth daily. For high blood pressure   phenytoin 100 MG ER capsule Commonly known as:  DILANTIN Take 1 capsule (100 mg total) by mouth 3 (three) times daily.   thiamine 100 MG tablet Take 1 tablet (100 mg total) by mouth daily.   traZODone 100 MG tablet Commonly known as:  DESYREL Take 1 tablet (100 mg total) by mouth at bedtime and may repeat dose one time if needed.        DISCHARGE INSTRUCTIONS:  See AVS.  If you experience worsening of your admission symptoms, develop shortness of breath, life threatening emergency, suicidal or homicidal thoughts you must seek medical attention immediately by calling 911 or calling your MD immediately  if symptoms less severe.  You Must read complete instructions/literature along with all the possible adverse reactions/side effects for all the Medicines you take and that have been prescribed to you. Take any new Medicines after you have completely understood and accpet all the possible adverse reactions/side effects.   Please note  You were cared for by a hospitalist during your hospital stay. If you have any questions about your discharge medications or the care you received while you were in the hospital after you are discharged, you can call the unit and asked to speak  with the hospitalist on call if the hospitalist that took care of you is not available. Once you are discharged, your primary care physician will handle any further medical issues. Please note that NO REFILLS for any discharge medications will be authorized once you are discharged, as it is imperative that you return to your primary care physician (or establish a relationship  with a primary care physician if you do not have one) for your aftercare needs so that they can reassess your need for medications and monitor your lab values.    On the day of Discharge:  VITAL SIGNS:  Blood pressure (!) 148/99, pulse 80, temperature 98.5 F (36.9 C), temperature source Oral, resp. rate 18, height 5\' 8"  (1.727 m), weight 143 lb 12.8 oz (65.2 kg), SpO2 99 %. PHYSICAL EXAMINATION:  GENERAL:  52 y.o.-year-old patient lying in the bed with no acute distress.  EYES: Pupils equal, round, reactive to light and accommodation. No scleral icterus. Extraocular muscles intact.  HEENT: Head atraumatic, normocephalic. Oropharynx and nasopharynx clear.  NECK:  Supple, no jugular venous distention. No thyroid enlargement, no tenderness.  LUNGS: Normal breath sounds bilaterally, no wheezing, rales,rhonchi or crepitation. No use of accessory muscles of respiration.  CARDIOVASCULAR: S1, S2 normal. No murmurs, rubs, or gallops.  ABDOMEN: Soft, non-tender, non-distended. Bowel sounds present. No organomegaly or mass.  EXTREMITIES: No pedal edema, cyanosis, or clubbing.  NEUROLOGIC: Cranial nerves II through XII are intact. Muscle strength 5/5 in all extremities. Sensation intact. Gait not checked.  PSYCHIATRIC: The patient is alert and oriented x 3.  SKIN: No obvious rash, lesion, or ulcer.  DATA REVIEW:   CBC Recent Labs  Lab 09/24/17 0434  WBC 3.9  HGB 14.6  HCT 43.5  PLT 55*    Chemistries  Recent Labs  Lab 09/22/17 0000 09/23/17 0000 09/24/17 0434  NA 140  --  139  K 3.9  --  3.5  CL 99  --  103  CO2 28  --  27  GLUCOSE 96  --  84  BUN 6  --  15  CREATININE 0.57*  --  0.75  CALCIUM 9.3  --  8.9  MG  --  1.8  --   AST 76*  --   --   ALT 45*  --   --   ALKPHOS 73  --   --   BILITOT 1.2  --   --      Microbiology Results  Results for orders placed or performed during the hospital encounter of 09/22/17  MRSA PCR Screening     Status: None   Collection Time:  09/23/17  3:09 AM  Result Value Ref Range Status   MRSA by PCR NEGATIVE NEGATIVE Final    Comment:        The GeneXpert MRSA Assay (FDA approved for NASAL specimens only), is one component of a comprehensive MRSA colonization surveillance program. It is not intended to diagnose MRSA infection nor to guide or monitor treatment for MRSA infections. Performed at [facility], [address] [place], [place] [address]     RADIOLOGY:  No results found.   Management plans discussed with the patient, family and they are in agreement.  CODE STATUS: Full Code   TOTAL TIME TAKING CARE OF THIS PATIENT: 32 minutes.    Shaune Pollack M.D on 09/24/2017 at 10:58 AM  Between 7am to 6pm - Pager - [phone]  After 6pm go to [email or web address] - password [email or web address] [facility]  [facility]  [place] [facility]  Office  [phone]  CC: Primary care physician; Hilbert CorriganPlacey, Chales AbrahamsMary Ann, NP   Note: This dictation was prepared with Dragon dictation along with smaller phrase technology. Any transcriptional errors that result from this process are unintentional.

## 2017-09-24 NOTE — Progress Notes (Signed)
Patient up to shower, patient refusing fall risk measures as stated in previous note. Awaiting SW consult for discharge transportation needs. Bo McclintockBrewer,Chonte Ricke S, RN

## 2017-09-24 NOTE — Clinical Social Work Note (Signed)
CSW consulted for transportation needs. CSW met with patient and he states that he is homeless and needs a PART bus pass to get back to [facility] CSW explained that we do not have PART but passes but only LINK bus passes. CSW offered patient a [employer] bus pass and he refused stating "what am I going to do in [place]?" Patient states that he will walk to [facility] and does not want any other assistance at this time. CSW signing off. Please re consult if further needs arise.   [place], [place]

## 2017-09-24 NOTE — Progress Notes (Signed)
Medications provided to patient. Patient discharged. Rodney Watson,[name], RN

## 2017-09-24 NOTE — Progress Notes (Signed)
Discussed discharge instructions and medications with patient.  All questions addressed. Patient awaiting medications to be delivered from med management by a volunteer.  Orson Apeanielle Cameron Katayama, RN, BSN

## 2017-09-24 NOTE — Discharge Instructions (Signed)
Outpatient detox

## 2017-09-24 NOTE — Progress Notes (Signed)
Patient up in the room with alarm going off.  Patient stated that the doctor is discharging him and he doesn't want alarm on anymore.  Patient's nurse Lance BoschShea made aware.  Orson Apeanielle Marielena Harvell, RN, BSN

## 2017-09-25 LAB — PHENYTOIN LEVEL, FREE AND TOTAL
PHENYTOIN, TOTAL: 16.8 ug/mL (ref 10.0–20.0)
Phenytoin, Free: 1.1 ug/mL (ref 1.0–2.0)

## 2017-09-26 ENCOUNTER — Emergency Department
Admission: EM | Admit: 2017-09-26 | Discharge: 2017-09-27 | Disposition: A | Discharge status: Home / Self Care | Payer: Medicaid Other | Source: Home / Self Care | Attending: Emergency Medicine | Admitting: Emergency Medicine

## 2017-09-26 DIAGNOSIS — T420X2A Poisoning by hydantoin derivatives, intentional self-harm, initial encounter: Secondary | ICD-10-CM

## 2017-09-26 DIAGNOSIS — R45851 Suicidal ideations: Secondary | ICD-10-CM | POA: Insufficient documentation

## 2017-09-26 DIAGNOSIS — F10120 Alcohol abuse with intoxication, uncomplicated: Secondary | ICD-10-CM | POA: Diagnosis not present

## 2017-09-26 DIAGNOSIS — Z046 Encounter for general psychiatric examination, requested by authority: Secondary | ICD-10-CM | POA: Insufficient documentation

## 2017-09-26 DIAGNOSIS — F329 Major depressive disorder, single episode, unspecified: Secondary | ICD-10-CM | POA: Insufficient documentation

## 2017-09-26 DIAGNOSIS — Z79899 Other long term (current) drug therapy: Secondary | ICD-10-CM | POA: Insufficient documentation

## 2017-09-26 DIAGNOSIS — J449 Chronic obstructive pulmonary disease, unspecified: Secondary | ICD-10-CM

## 2017-09-26 DIAGNOSIS — F17210 Nicotine dependence, cigarettes, uncomplicated: Secondary | ICD-10-CM | POA: Insufficient documentation

## 2017-09-26 DIAGNOSIS — Z590 Homelessness: Secondary | ICD-10-CM | POA: Insufficient documentation

## 2017-09-26 DIAGNOSIS — F39 Unspecified mood [affective] disorder: Secondary | ICD-10-CM | POA: Insufficient documentation

## 2017-09-26 DIAGNOSIS — I10 Essential (primary) hypertension: Secondary | ICD-10-CM | POA: Insufficient documentation

## 2017-09-26 DIAGNOSIS — Y906 Blood alcohol level of 120-199 mg/100 ml: Secondary | ICD-10-CM | POA: Diagnosis not present

## 2017-09-26 DIAGNOSIS — F1010 Alcohol abuse, uncomplicated: Secondary | ICD-10-CM

## 2017-09-26 DIAGNOSIS — R451 Restlessness and agitation: Secondary | ICD-10-CM | POA: Insufficient documentation

## 2017-09-26 DIAGNOSIS — T50902A Poisoning by unspecified drugs, medicaments and biological substances, intentional self-harm, initial encounter: Secondary | ICD-10-CM

## 2017-09-26 DIAGNOSIS — F4320 Adjustment disorder, unspecified: Secondary | ICD-10-CM | POA: Insufficient documentation

## 2017-09-26 LAB — SALICYLATE LEVEL

## 2017-09-26 LAB — COMPREHENSIVE METABOLIC PANEL
ALT: 27 U/L (ref 0–44)
AST: 28 U/L (ref 15–41)
Albumin: 4.3 g/dL (ref 3.5–5.0)
Alkaline Phosphatase: 76 U/L (ref 38–126)
Anion gap: 7 (ref 5–15)
BUN: 11 mg/dL (ref 6–20)
CALCIUM: 8.7 mg/dL — AB (ref 8.9–10.3)
CO2: 25 mmol/L (ref 22–32)
CREATININE: 0.7 mg/dL (ref 0.61–1.24)
Chloride: 104 mmol/L (ref 98–111)
Glucose, Bld: 85 mg/dL (ref 70–99)
Potassium: 3.5 mmol/L (ref 3.5–5.1)
Sodium: 136 mmol/L (ref 135–145)
Total Bilirubin: 0.9 mg/dL (ref 0.3–1.2)
Total Protein: 7.5 g/dL (ref 6.5–8.1)

## 2017-09-26 LAB — CBC
HEMATOCRIT: 40.2 % (ref 40.0–52.0)
Hemoglobin: 13.5 g/dL (ref 13.0–18.0)
MCH: 31.6 pg (ref 26.0–34.0)
MCHC: 33.6 g/dL (ref 32.0–36.0)
MCV: 94.1 fL (ref 80.0–100.0)
Platelets: 90 10*3/uL — ABNORMAL LOW (ref 150–440)
RBC: 4.27 MIL/uL — AB (ref 4.40–5.90)
RDW: 16.4 % — ABNORMAL HIGH (ref 11.5–14.5)
WBC: 7.6 10*3/uL (ref 3.8–10.6)

## 2017-09-26 LAB — PHENYTOIN LEVEL, TOTAL: PHENYTOIN LVL: 31.7 ug/mL — AB (ref 10.0–20.0)

## 2017-09-26 LAB — ETHANOL: Alcohol, Ethyl (B): <10 mg/dL (ref <10)

## 2017-09-26 LAB — ACETAMINOPHEN LEVEL

## 2017-09-26 MED ORDER — CHARCOAL ACTIVATED PO LIQD: 1.0000 g/kg | Freq: Once | ORAL | Status: DC

## 2017-09-26 MED ORDER — SODIUM CHLORIDE 0.9 % IV BOLUS
1000.0000 mL | Freq: Once | INTRAVENOUS | Status: AC
  Administered 2017-09-26: 1000 mL via INTRAVENOUS

## 2017-09-26 NOTE — ED Notes (Signed)
Poison control called.  Recommend pt. Receive charcoal 1gram per kg charcoal, hydrate, EKG and monitor for seizures.  Will call back for lab values, tylenol levels, cmp. Monitor bowel sounds.

## 2017-09-26 NOTE — ED Triage Notes (Signed)
Pt. States he took an overdose of Dilantin to "try to get high"

## 2017-09-26 NOTE — ED Notes (Signed)
Pt. States taking an overdose of Dilantin, in an attempt to "get high".  Pt. Denies SI and HI.  Pt. States at around 3-pm today taking 15 100 mg tablets of Dilantin and taking another 5 100 mg tablets a short time later.

## 2017-09-26 NOTE — ED Notes (Signed)
Pt. Is alert and oriented, but unsteady on feet.  Pt. Moved from 19 H to room 20 A.

## 2017-09-26 NOTE — ED Provider Notes (Signed)
[facility] Emergency Department Provider Note ____________________________________________   I have reviewed the triage vital signs and the nursing notes.   HISTORY  Chief Complaint Drug Overdose   History limited by: Not Limited   HPI Rodney Watson is a 52 y.o. male who presents to the emergency department today after intentional overdose.  Patient states he took 20 of his Dilantin.  He states initially took 15 then took another 5.  He was trying to get high.  He states that he has used this method to get high or buzzed in the past.  He states he is an addict.  He denies any intention of self-harm.    Per medical record review patient has a history of seizures, alcohol abuse, depression  Past Medical History:  Diagnosis Date  . Alcohol abuse   . COPD (chronic obstructive pulmonary disease) (HCC)   . Depression   . ETOH abuse   . Seizures [facility])     Patient Active Problem List   Diagnosis Date Noted  . Alcohol withdrawal (HCC) 07/18/2017  . Seizure disorder (HCC) 07/18/2017  . Essential hypertension 07/18/2017  . 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 MDD (major depressive disorder), recurrent episode, severe (HCC) 04/13/2017  . Alcohol abuse with alcohol-induced mood disorder (HCC) 04/10/2017  . Alcohol abuse 04/01/2017    No past surgical history on file.  Prior to Admission medications   Medication Sig Start Date End Date Taking? Authorizing Provider  FLUoxetine (PROZAC) 20 MG capsule Take 1 capsule (20 mg total) by mouth daily. 07/19/17   Calvert Cantor, MD  folic acid (FOLVITE) 1 MG tablet Take 1 tablet (1 mg total) by mouth daily. 09/24/17   Shaune Pollack, MD  gabapentin (NEURONTIN) 300 MG capsule Take 1 capsule (300 mg total) by mouth 3 (three) times daily. 07/19/17   Calvert Cantor, MD  lisinopril (PRINIVIL,ZESTRIL) 10 MG tablet Take 1 tablet (10 mg total) by mouth daily. For high blood pressure  07/19/17   Calvert Cantor, MD  phenytoin (DILANTIN) 100 MG ER capsule Take 1 capsule (100 mg total) by mouth 3 (three) times daily. 09/24/17   Shaune Pollack, MD  thiamine 100 MG tablet Take 1 tablet (100 mg total) by mouth daily. 09/24/17   Shaune Pollack, MD  traZODone (DESYREL) 100 MG tablet Take 1 tablet (100 mg total) by mouth at bedtime and may repeat dose one time if needed. 07/19/17   Calvert Cantor, MD    Allergies Chocolate and Vicodin [hydrocodone-acetaminophen]  Family History  Problem Relation Age of Onset  . Alcohol abuse Father   . Seizures Neg Hx     Social History Social History   Tobacco Use  . Smoking status: Current Every Day Smoker    Packs/day: 1.50    Years: 39.00    Pack years: 58.50    Types: Cigarettes  . Smokeless tobacco: Current User    Types: Chew  Substance Use Topics  . Alcohol use: Yes    Comment: 5th daily  . Drug use: Yes    Types: Marijuana    Comment: 1-2 joints/week    Review of Systems Constitutional: No fever/chills Eyes: No visual changes. ENT: No sore throat. Cardiovascular: Denies chest pain. Respiratory: Denies shortness of breath. Gastrointestinal: No abdominal pain.  Positive for nausea Genitourinary: Negative for dysuria. Musculoskeletal: Negative for back pain. Skin: Negative for rash. Neurological: Negative for headaches, focal weakness or numbness.  ____________________________________________   PHYSICAL EXAM:  VITAL SIGNS:  ED Triage Vitals [09/26/17 2133]  Enc Vitals Group     BP (!) 145/101     Pulse Rate 89     Resp 18     Temp 99.7 F (37.6 C)     Temp Source Oral     SpO2 99 %     Weight    Constitutional: Alert and oriented.  Eyes: Conjunctivae are normal.  ENT      Head: Normocephalic and atraumatic.      Nose: No congestion/rhinnorhea.      Mouth/Throat: Mucous membranes are moist.      Neck: No stridor. Hematological/Lymphatic/Immunilogical: No cervical lymphadenopathy. Cardiovascular: Normal rate,  regular rhythm.  No murmurs, rubs, or gallops.  Respiratory: Normal respiratory effort without tachypnea nor retractions. Breath sounds are clear and equal bilaterally. No wheezes/rales/rhonchi. Gastrointestinal: Soft and non tender. No rebound. No guarding.  Genitourinary: Deferred Musculoskeletal: Normal range of motion in all extremities. No lower extremity edema. Neurologic:  Normal speech and language. No gross focal neurologic deficits are appreciated.  Skin:  Skin is warm, dry and intact. No rash noted. Psychiatric: Mood and affect are normal. Speech and behavior are normal. Patient exhibits appropriate insight and judgment.  ____________________________________________    LABS (pertinent positives/negatives)  Phenytoin 31.7 Ethanol, salicylate, acetaminophen below threshold CBC wbc 7.6, hgb 13.5, plt 90 CMP wnl except ca 8.7  ____________________________________________   EKG  I, Phineas SemenGraydon Anadia Helmes, attending physician, personally viewed and interpreted this EKG  EKG Time: 2226 Rate: 77 Rhythm: sinus rhythm Axis: normal Intervals: qtc 489 QRS: narrow, q waves v1 ST changes: no st elevation Impression: abnormal ekg  ____________________________________________    RADIOLOGY  None  ____________________________________________   PROCEDURES  Procedures  ____________________________________________   INITIAL IMPRESSION / ASSESSMENT AND PLAN / ED COURSE  Pertinent labs & imaging results that were available during my care of the patient were reviewed by me and considered in my medical decision making (see chart for details).   Patient presented to the emergency department today after intentional overdose of dilated.  Patient states that he took it to get high.  Patient was IVC by outside person.  Will continue IVC given level of overdose.  Poison control was called.  ____________________________________________   FINAL CLINICAL IMPRESSION(S) / ED  DIAGNOSES  Final diagnoses:  Drug overdose, undetermined intent, initial encounter     Note: This dictation was prepared with Dragon dictation. Any transcriptional errors that result from this process are unintentional     Phineas SemenGoodman, Tejay Hubert, MD 09/26/17 [phone]

## 2017-09-26 NOTE — ED Notes (Signed)
Pt. Changed in room.  Pt. Was able to disrobe, pt. Has pair of blue jeans, black belt, red shirt, pair of ankle high brown shoes and pair of white socks.   Pt. Also has red/black back pack.  Pt. Has two bags one [facility] white bag and one red/black back pack.  Both items placed in [facility] locker room.

## 2017-09-27 ENCOUNTER — Encounter: Payer: Self-pay | Admitting: Emergency Medicine

## 2017-09-27 ENCOUNTER — Other Ambulatory Visit: Payer: Self-pay

## 2017-09-27 ENCOUNTER — Emergency Department
Admission: EM | Admit: 2017-09-27 | Discharge: 2017-09-29 | Disposition: A | Discharge status: Other Acute Inpatient Hospital | Payer: Medicaid Other | Attending: Emergency Medicine | Admitting: Emergency Medicine

## 2017-09-27 DIAGNOSIS — F1010 Alcohol abuse, uncomplicated: Secondary | ICD-10-CM

## 2017-09-27 DIAGNOSIS — F39 Unspecified mood [affective] disorder: Secondary | ICD-10-CM

## 2017-09-27 LAB — COMPREHENSIVE METABOLIC PANEL
ALT: 24 U/L (ref 0–44)
AST: 24 U/L (ref 15–41)
Albumin: 4.1 g/dL (ref 3.5–5.0)
Alkaline Phosphatase: 74 U/L (ref 38–126)
Anion gap: 10 (ref 5–15)
BILIRUBIN TOTAL: 0.5 mg/dL (ref 0.3–1.2)
BUN: 9 mg/dL (ref 6–20)
CALCIUM: 8.7 mg/dL — AB (ref 8.9–10.3)
CO2: 22 mmol/L (ref 22–32)
CREATININE: 0.69 mg/dL (ref 0.61–1.24)
Chloride: 111 mmol/L (ref 98–111)
GFR calc Af Amer: >60 mL/min (ref >60)
Glucose, Bld: 84 mg/dL (ref 70–99)
Potassium: 3.5 mmol/L (ref 3.5–5.1)
Sodium: 143 mmol/L (ref 135–145)
Total Protein: 7.4 g/dL (ref 6.5–8.1)

## 2017-09-27 LAB — SALICYLATE LEVEL

## 2017-09-27 LAB — URINE DRUG SCREEN, QUALITATIVE (ARMC ONLY)
AMPHETAMINES, UR SCREEN: NOT DETECTED
Amphetamines, Ur Screen: NOT DETECTED
BARBITURATES, UR SCREEN: NOT DETECTED
Barbiturates, Ur Screen: NOT DETECTED
CANNABINOID 50 NG, UR ~~LOC~~: NOT DETECTED
CANNABINOID 50 NG, UR ~~LOC~~: NOT DETECTED
Cocaine Metabolite,Ur ~~LOC~~: NOT DETECTED
Cocaine Metabolite,Ur ~~LOC~~: NOT DETECTED
MDMA (ECSTASY) UR SCREEN: NOT DETECTED
MDMA (ECSTASY) UR SCREEN: NOT DETECTED
Methadone Scn, Ur: NOT DETECTED
Methadone Scn, Ur: NOT DETECTED
OPIATE, UR SCREEN: NOT DETECTED
Opiate, Ur Screen: NOT DETECTED
PHENCYCLIDINE (PCP) UR S: NOT DETECTED
PHENCYCLIDINE (PCP) UR S: NOT DETECTED
Tricyclic, Ur Screen: NOT DETECTED
Tricyclic, Ur Screen: NOT DETECTED

## 2017-09-27 LAB — CBC
HCT: 40.7 % (ref 40.0–52.0)
Hemoglobin: 13.6 g/dL (ref 13.0–18.0)
MCH: 31.6 pg (ref 26.0–34.0)
MCHC: 33.5 g/dL (ref 32.0–36.0)
MCV: 94.5 fL (ref 80.0–100.0)
PLATELETS: 134 10*3/uL — AB (ref 150–440)
RBC: 4.31 MIL/uL — ABNORMAL LOW (ref 4.40–5.90)
RDW: 16.3 % — AB (ref 11.5–14.5)
WBC: 6.3 10*3/uL (ref 3.8–10.6)

## 2017-09-27 LAB — PHENYTOIN LEVEL, TOTAL: Phenytoin Lvl: 26.9 ug/mL — ABNORMAL HIGH (ref 10.0–20.0)

## 2017-09-27 LAB — ACETAMINOPHEN LEVEL: Acetaminophen (Tylenol), Serum: <10 ug/mL — ABNORMAL LOW (ref 10–30)

## 2017-09-27 LAB — ETHANOL: ALCOHOL ETHYL (B): 157 mg/dL — AB (ref <10)

## 2017-09-27 MED ORDER — SODIUM CHLORIDE 0.9 % IV BOLUS
1000.0000 mL | Freq: Once | INTRAVENOUS | Status: AC
  Administered 2017-09-27: 1000 mL via INTRAVENOUS

## 2017-09-27 NOTE — ED Notes (Signed)
Pt. Given cup of water upon request. 

## 2017-09-27 NOTE — ED Provider Notes (Signed)
-----------------------------------------   1:01 AM on 09/27/2017 -----------------------------------------  [facility] who recommends discharge home with outpatient follow-up.  Noted elevated Dilantin level.  Will recheck level after second liter IV fluids.   ----------------------------------------- 3:52 AM on 09/27/2017 -----------------------------------------  Noted Dilantin level is trending down.   ----------------------------------------- 5:57 AM on 09/27/2017 -----------------------------------------  No further events overnight.  Patient was cleared by poison control.  Will discharge home in the morning once he is able to secure a ride.  Strict return precautions given.  Patient verbalizes understanding and agrees with plan of care.   Irean HongSung, Jade J, MD 09/27/17 [phone]

## 2017-09-27 NOTE — ED Provider Notes (Signed)
[facility] Emergency Department Provider Note       Time seen: ----------------------------------------- 8:56 PM on 09/27/2017 -----------------------------------------   I have reviewed the triage vital signs and the nursing notes.  HISTORY   Chief Complaint Suicidal    HPI Rodney Watson is a 52 y.o. male with a history of call abuse, COPD, depression, seizures who presents to the ED for wanting to kill himself.  Patient initially denies any alcohol intake, states he is been homeless since 1986.  Patient was just discharged from here earlier today.  Patient states he has a plan to jump in front of a moving train.  He states if he let me go tonight I walked on the railroad tracks and jump in front of the train.  He has had 5 beers today.  Past Medical History:  Diagnosis Date  . Alcohol abuse   . COPD (chronic obstructive pulmonary disease) (HCC)   . Depression   . ETOH abuse   . Seizures [facility])     Patient Active Problem List   Diagnosis Date Noted  . Alcohol withdrawal (HCC) 07/18/2017  . Seizure disorder (HCC) 07/18/2017  . Essential hypertension 07/18/2017  . 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 MDD (major depressive disorder), recurrent episode, severe (HCC) 04/13/2017  . Alcohol abuse with alcohol-induced mood disorder (HCC) 04/10/2017  . Alcohol abuse 04/01/2017    History reviewed. No pertinent surgical history.  Allergies Chocolate and Vicodin [hydrocodone-acetaminophen]  Social History Social History   Tobacco Use  . Smoking status: Current Every Day Smoker    Packs/day: 1.50    Years: 39.00    Pack years: 58.50    Types: Cigarettes  . Smokeless tobacco: Current User    Types: Chew  Substance Use Topics  . Alcohol use: Yes    Comment: 5th daily  . Drug use: Yes    Types: Marijuana    Comment: 1-2 joints/week   Review of Systems Constitutional: Negative for  fever. Cardiovascular: Negative for chest pain. Respiratory: Negative for shortness of breath. Gastrointestinal: Negative for abdominal pain, vomiting and diarrhea. Skin: Negative for rash. Neurological: Negative for headaches, focal weakness or numbness. Psychiatric: Positive for suicidal ideation, substance abuse  All systems negative/normal/unremarkable except as stated in the HPI  ____________________________________________   PHYSICAL EXAM:  VITAL SIGNS: ED Triage Vitals  Enc Vitals Group     BP 09/27/17 2027 125/76     Pulse Rate 09/27/17 2027 97     Resp 09/27/17 2027 16     Temp 09/27/17 2027 98.2 F (36.8 C)     Temp Source 09/27/17 2027 Oral     SpO2 09/27/17 2027 97 %     Weight 09/27/17 2028 140 lb (63.5 kg)     Height 09/27/17 2028 5\' 8"  (1.727 m)     Head Circumference --      Peak Flow --      Pain Score 09/27/17 2028 8     Pain Loc --      Pain Edu? --      Excl. in GC? --    Constitutional: Alert and oriented. Well appearing and in no distress. Eyes: Conjunctivae are normal.  Horizontal nystagmus is noted ENT   Head: Normocephalic and atraumatic.   Nose: No congestion/rhinnorhea.   Mouth/Throat: Mucous membranes are moist.   Neck: No stridor. Cardiovascular: Normal rate, regular rhythm. No murmurs, rubs, or gallops. Respiratory: Normal respiratory effort without tachypnea nor retractions. Breath  sounds are clear and equal bilaterally. No wheezes/rales/rhonchi. Gastrointestinal: Soft and nontender. Normal bowel sounds Musculoskeletal: Nontender with normal range of motion in extremities. No lower extremity tenderness nor edema. Neurologic:  Normal speech and language. No gross focal neurologic deficits are appreciated.  Skin:  Skin is warm, dry and intact. No rash noted. Psychiatric: Agitated mood, patient is exhibiting suicidal thought ____________________________________________  ED COURSE:  As part of my medical decision making, I  reviewed the following data within the electronic MEDICAL RECORD NUMBER History obtained from family if available, nursing notes, old chart and ekg, as well as notes from prior ED visits. Patient presented for suicidal ideation, we will assess with labs and imaging as indicated at this time.   Procedures ____________________________________________   LABS (pertinent positives/negatives)  Labs Reviewed  CBC - Abnormal; Notable for the following components:      Result Value   RBC 4.31 (*)    RDW 16.3 (*)    Platelets 134 (*)    All other components within normal limits  COMPREHENSIVE METABOLIC PANEL  ETHANOL  SALICYLATE LEVEL  ACETAMINOPHEN LEVEL  URINE DRUG SCREEN, QUALITATIVE ([facility] ONLY)   ____________________________________________  DIFFERENTIAL DIAGNOSIS   Alcohol intoxication, substance use disorder, suicidal ideation, depression, homelessness  FINAL ASSESSMENT AND PLAN  Alcohol intoxication, suicidal ideation   Plan: The patient had presented for alcohol intoxication and suicidal ideation. Patient's labs do not reveal any acute process other than alcohol intoxication.  He is medically stable for psychiatric evaluation and disposition tomorrow.   Ulice DashJohnathan E Samariya Rockhold, MD   Note: This note was generated in part or whole with voice recognition software. Voice recognition is usually quite accurate but there are transcription errors that can and very often do occur. I apologize for any typographical errors that were not detected and corrected.     Emily FilbertWilliams, Emmerson Taddei E, MD 09/27/17 2105

## 2017-09-27 NOTE — Discharge Instructions (Addendum)
Only take medicines as often as your doctor prescribes.  Return to the ER for worsening symptoms, feelings of hurting yourself or others, or other concerns.

## 2017-09-27 NOTE — ED Triage Notes (Signed)
Pt to ED voluntarily by police c/o SI.  Plan to jump in front of a train.  States "if you let me go tonight I will walk down to the railroad tracks and jump in front of it".  Denies HI.  Denies auditory hallucinations but states sees "people that are dead and come talk to me".  Pt states had 5 beers today.  States took 20 dilantin yesterday and was seen here then for overdose.

## 2017-09-27 NOTE — ED Notes (Signed)
Pt dressed into hospital scrubs by this RN with officer present in room.  Belongings placed into bags with pt labels on them.  Belongings included: 1 pair jeans, 1 hat, 1 belt, 1 red shirt, 1 pair boots, 1 pair hospital socks, 1 wallet.  Pocket knife left with pt label with security up front.  Pt has 2 belongings bag and 1 red/black backpack.

## 2017-09-28 MED ORDER — LORAZEPAM 2 MG PO TABS
2.0000 mg | ORAL_TABLET | Freq: Once | ORAL | Status: AC
  Administered 2017-09-28: 2 mg via ORAL
  Filled 2017-09-28: qty 1

## 2017-09-28 NOTE — ED Notes (Signed)
[employer] called, report given.  Pt. Moved to [facility] with [facility].

## 2017-09-28 NOTE — ED Notes (Signed)
Patient given 2mg  Ativan due to alcohol withdrawals, patient appears very shaky. Patient was told by writer to only get up to use the restroom and to notify staff when he has to go to the restroom, so they can give assistance. Patient agreed.

## 2017-09-28 NOTE — ED Notes (Signed)
Contacted [facility] for possible admission to [facility].  TTS stated they would check patient to see if he was a candidate for program.

## 2017-09-28 NOTE — BH Assessment (Signed)
Assessment Note  Rodney Watson is an 52 y.o. male to ED via [facility] after calling 911 due to SI. Pt recently released from [facility] earlier in day due to overdosing on dilantin. Pt reports that he is homeless and has been thinking about hurting himself a lot lately. Pt reports, "I been thinking about it for a while. It has to do with my life.Marland Kitchenit's the same thing everyday." Pt has active plan of jumping in front of a moving train. Pt has hx of multiple hospitalizations at [facility] due to depression, SI, and alcohol use. Pt reports he called 911 because he didn't want to do something that he would regret.  According to previous assessment, pt has hx of trauma and abuse. Pt admits to hx of cutting and at least one suicide attempt that occurred in 1989. Pt denies HI, AH, and VH currently. Pt calm, but very despondent at time of assessment. Pt ETOH upon arrival was 157.  Diagnosis: Depression, alcohol use disorder  Past Medical History:  Past Medical History:  Diagnosis Date  . Alcohol abuse   . COPD (chronic obstructive pulmonary disease) (HCC)   . Depression   . ETOH abuse   . Seizures (HCC)     History reviewed. No pertinent surgical history.  Family History:  Family History  Problem Relation Age of Onset  . Alcohol abuse Father   . Seizures Neg Hx     Social History:  reports that he has been smoking cigarettes. He has a 58.50 pack-year smoking history. His smokeless tobacco use includes chew. He reports that he drinks alcohol. He reports that he has current or past drug history. Drug: Marijuana.  Additional Social History:  Alcohol / Drug Use Pain Medications: see PTA  Prescriptions: see PTA Over the Counter: see PTA History of alcohol / drug use?: Yes Negative Consequences of Use: Personal relationships, Financial, Legal Substance #1 Name of Substance 1: alcohol 1 - Age of First Use: 13 1 - Amount (size/oz): varies 1 - Frequency: daily 1 - Duration: unable to quantify 1 - Last Use /  Amount: yesterday Substance #2 Name of Substance 2: marijuana 2 - Age of First Use: 13 2 - Amount (size/oz): unable to quantify 2 - Frequency: at least twice per month 2 - Duration: unable to quantify 2 - Last Use / Amount: unable to quantify  CIWA: CIWA-Ar BP: 125/76 Pulse Rate: 97 COWS:    Allergies:  Allergies  Allergen Reactions  . Chocolate Hives  . Vicodin [Hydrocodone-Acetaminophen] Nausea And Vomiting    Home Medications:  (Not in a hospital admission)  OB/GYN Status:  No LMP for male patient.  General Assessment Data Location of Assessment: [facility] ED TTS Assessment: In system Is this a Tele or Face-to-Face Assessment?: Face-to-Face Is this an Initial Assessment or a Re-assessment for this encounter?: Initial Assessment Marital status: Single Maiden name: n/a Is patient pregnant?: No Pregnancy Status: No Living Arrangements: Other (Comment)(homeless) Can pt return to current living arrangement?: No Admission Status: Involuntary Is patient capable of signing voluntary admission?: No Referral Source: Self/Family/Friend Insurance type: Generic inmate  Medical Screening Exam [facility] Walk-in ONLY) Medical Exam completed: Yes  Crisis Care Plan Living Arrangements: Other (Comment)(homeless) Legal Guardian: Other:(self) Name of Psychiatrist: none reported Name of Therapist: Pt possibly linked to [facility] Path team  Education Status Is patient currently in school?: No Is the patient employed, unemployed or receiving disability?: Unemployed  Risk to self with the past 6 months Suicidal Ideation: Yes-Currently Present Has patient  been a risk to self within the past 6 months prior to admission? : Yes Suicidal Intent: Yes-Currently Present Has patient had any suicidal intent within the past 6 months prior to admission? : Yes Is patient at risk for suicide?: Yes Suicidal Plan?: Yes-Currently Present Has patient had any suicidal plan within the past 6 months prior to  admission? : Yes Specify Current Suicidal Plan: Pt reports to wanting to jump in front of a moving train Access to Means: Yes Specify Access to Suicidal Means: pt is homeless, able to gain access to different forms of traffic What has been your use of drugs/alcohol within the last 12 months?: alcohol use frquent Previous Attempts/Gestures: Yes How many times?: 1 Other Self Harm Risks: history of cutting Triggers for Past Attempts: (trauma, homelessness, financial hardship) Intentional Self Injurious Behavior: Cutting Comment - Self Injurious Behavior: pt has hx of cutting, reported attempted suicide in 1999 Family Suicide History: No Recent stressful life event(s): Financial Problems, Recent negative physical changes Persecutory voices/beliefs?: No Depression: Yes Depression Symptoms: Despondent, Tearfulness, Isolating, Fatigue, Guilt, Loss of interest in usual pleasures, Feeling worthless/self pity, Feeling angry/irritable Substance abuse history and/or treatment for substance abuse?: Yes Suicide prevention information given to non-admitted patients: Not applicable  Risk to Others within the past 6 months Homicidal Ideation: No Does patient have any lifetime risk of violence toward others beyond the six months prior to admission? : No Thoughts of Harm to Others: No Current Homicidal Intent: No Current Homicidal Plan: No Access to Homicidal Means: No Identified Victim: None identified History of harm to others?: No Assessment of Violence: In distant past Violent Behavior Description: Pt has resolved charges over 20 years ago of property damage and indecent liberties with a minor Does patient have access to weapons?: No Criminal Charges Pending?: Yes Describe Pending Criminal Charges: misdeamenor-larceny Does patient have a court date: Yes Court Date: 11/04/17 Is patient on probation?: No  Psychosis Hallucinations: None noted Delusions: None noted  Mental Status  Report Appearance/Hygiene: Body odor, Disheveled Eye Contact: Poor Motor Activity: Restlessness Speech: Pressured, Slow Level of Consciousness: Restless Mood: Depressed, Sad, Worthless, low self-esteem Affect: Blunted, Depressed Anxiety Level: Minimal Thought Processes: Relevant Judgement: Partial Orientation: Appropriate for developmental age Obsessive Compulsive Thoughts/Behaviors: None  Cognitive Functioning Concentration: Fair Memory: Recent Impaired Is patient IDD: No Is patient DD?: Unknown Insight: Poor Impulse Control: Poor Appetite: Fair Have you had any weight changes? : No Change Sleep: Decreased Total Hours of Sleep: 5 Vegetative Symptoms: Decreased grooming  ADLScreening [facility] Assessment Services) Patient's cognitive ability adequate to safely complete daily activities?: No Patient able to express need for assistance with ADLs?: Yes Independently performs ADLs?: Yes (appropriate for developmental age)  Prior Inpatient Therapy Prior Inpatient Therapy: Yes Prior Therapy Dates: multiple hospitalizations in 2019 [facility] Reason for Treatment: depression, SI, alcohol use  Prior Outpatient Therapy Prior Outpatient Therapy: No Does patient have an ACCT team?: No Does patient have Intensive In-House Services?  : No Does patient have [facility] services? : No Does patient have P4CC services?: No  ADL Screening (condition at time of admission) Patient's cognitive ability adequate to safely complete daily activities?: No Is the patient deaf or have difficulty hearing?: No Does the patient have difficulty seeing, even when wearing glasses/contacts?: No Does the patient have difficulty concentrating, remembering, or making decisions?: No Patient able to express need for assistance with ADLs?: Yes Does the patient have difficulty dressing or bathing?: No Independently performs ADLs?: Yes (appropriate for developmental  age) Does the  patient have difficulty walking or climbing stairs?: No Weakness of Legs: None Weakness of Arms/Hands: None  Home Assistive Devices/Equipment Home Assistive Devices/Equipment: None  Therapy Consults (therapy consults require a physician order) PT Evaluation Needed: No OT Evalulation Needed: No SLP Evaluation Needed: No Abuse/Neglect Assessment (Assessment to be complete while patient is alone) Abuse/Neglect Assessment Can Be Completed: Yes Physical Abuse: Denies Verbal Abuse: Denies Sexual Abuse: Denies Exploitation of patient/patient's resources: Denies Self-Neglect: Denies Values / Beliefs Cultural Requests During Hospitalization: None Spiritual Requests During Hospitalization: None Consults Spiritual Care Consult Needed: No Social Work Consult Needed: No Merchant navy officer (For Healthcare) Does Patient Have a Medical Advance Directive?: No Would patient like information on creating a medical advance directive?: No - Patient declined    Additional Information 1:1 In Past 12 Months?: No CIRT Risk: No Elopement Risk: No Does patient have medical clearance?: Yes     Disposition:  Disposition Initial Assessment Completed for this Encounter: Yes Disposition of Patient: (pending disposition) Patient refused recommended treatment: No Mode of transportation if patient is discharged?: Other Patient referred to: (pending disposition)  On Site Evaluation by:   Reviewed with Physician:    Aubery Lapping, MS, [facility] 09/28/2017 6:12 AM

## 2017-09-28 NOTE — BH Assessment (Signed)
Referral information for Psychiatric Hospitalization faxed to;   Marland Kitchen. [place] [phone] or [phone])  . [facility] [phone]),

## 2017-09-28 NOTE — ED Notes (Signed)
[facility] patient to detox for alcohol.

## 2017-09-28 NOTE — ED Provider Notes (Signed)
-----------------------------------------   6:21 AM on 09/28/2017 -----------------------------------------  [facility] Waldron SessionGerz who will rescind patient's IVC and recommends discharge with outpatient detox.  I have asked TTS to see if patient would be a candidate for RTS as he is homeless and suspect would just bounce back to the ER.  This is patient's third ED visit since 8/4.  If patient is not a candidate for RTS then would consider clinical social work consult.   Irean HongSung, Holland Kotter J, MD 09/28/17 [phone]

## 2017-09-28 NOTE — ED Notes (Signed)
Hourly rounding reveals patient sleeping in room. No complaints, stable, in no acute distress. Q15 minute rounds and monitoring via Security to continue. 

## 2017-09-28 NOTE — ED Notes (Signed)
Pt. Sleeping in hallway bed at this time.  Pt. Was given ativan earlier.

## 2017-09-29 ENCOUNTER — Inpatient Hospital Stay
Admission: AD | Admit: 2017-09-29 | Discharge: 2017-10-04 | DRG: 885 | Disposition: A | Discharge status: Home / Self Care | Payer: No Typology Code available for payment source | Source: Intra-hospital | Attending: Psychiatry | Admitting: Psychiatry

## 2017-09-29 ENCOUNTER — Encounter: Payer: Self-pay | Admitting: Psychiatry

## 2017-09-29 DIAGNOSIS — Z885 Allergy status to narcotic agent status: Secondary | ICD-10-CM | POA: Diagnosis not present

## 2017-09-29 DIAGNOSIS — Z91018 Allergy to other foods: Secondary | ICD-10-CM

## 2017-09-29 DIAGNOSIS — Z915 Personal history of self-harm: Secondary | ICD-10-CM | POA: Diagnosis not present

## 2017-09-29 DIAGNOSIS — G40909 Epilepsy, unspecified, not intractable, without status epilepticus: Secondary | ICD-10-CM | POA: Diagnosis present

## 2017-09-29 DIAGNOSIS — F10239 Alcohol dependence with withdrawal, unspecified: Secondary | ICD-10-CM | POA: Diagnosis present

## 2017-09-29 DIAGNOSIS — Z653 Problems related to other legal circumstances: Secondary | ICD-10-CM | POA: Diagnosis not present

## 2017-09-29 DIAGNOSIS — F1024 Alcohol dependence with alcohol-induced mood disorder: Secondary | ICD-10-CM | POA: Diagnosis present

## 2017-09-29 DIAGNOSIS — Z590 Homelessness: Secondary | ICD-10-CM

## 2017-09-29 DIAGNOSIS — F332 Major depressive disorder, recurrent severe without psychotic features: Secondary | ICD-10-CM | POA: Diagnosis present

## 2017-09-29 DIAGNOSIS — T43226A Underdosing of selective serotonin reuptake inhibitors, initial encounter: Secondary | ICD-10-CM | POA: Diagnosis present

## 2017-09-29 DIAGNOSIS — Z9119 Patient's noncompliance with other medical treatment and regimen: Secondary | ICD-10-CM

## 2017-09-29 DIAGNOSIS — Z79899 Other long term (current) drug therapy: Secondary | ICD-10-CM | POA: Diagnosis not present

## 2017-09-29 DIAGNOSIS — E039 Hypothyroidism, unspecified: Secondary | ICD-10-CM | POA: Diagnosis present

## 2017-09-29 DIAGNOSIS — F17210 Nicotine dependence, cigarettes, uncomplicated: Secondary | ICD-10-CM | POA: Diagnosis present

## 2017-09-29 DIAGNOSIS — Z91128 Patient's intentional underdosing of medication regimen for other reason: Secondary | ICD-10-CM | POA: Diagnosis not present

## 2017-09-29 DIAGNOSIS — Z811 Family history of alcohol abuse and dependence: Secondary | ICD-10-CM | POA: Diagnosis not present

## 2017-09-29 DIAGNOSIS — F1014 Alcohol abuse with alcohol-induced mood disorder: Secondary | ICD-10-CM | POA: Diagnosis present

## 2017-09-29 DIAGNOSIS — I10 Essential (primary) hypertension: Secondary | ICD-10-CM | POA: Diagnosis present

## 2017-09-29 DIAGNOSIS — R45851 Suicidal ideations: Secondary | ICD-10-CM | POA: Diagnosis present

## 2017-09-29 DIAGNOSIS — T420X6A Underdosing of hydantoin derivatives, initial encounter: Secondary | ICD-10-CM | POA: Diagnosis present

## 2017-09-29 DIAGNOSIS — G4700 Insomnia, unspecified: Secondary | ICD-10-CM | POA: Diagnosis present

## 2017-09-29 DIAGNOSIS — F39 Unspecified mood [affective] disorder: Secondary | ICD-10-CM | POA: Diagnosis not present

## 2017-09-29 MED ORDER — FOLIC ACID 1 MG PO TABS
1.0000 mg | ORAL_TABLET | Freq: Every day | ORAL | Status: DC
  Administered 2017-09-29: 1 mg via ORAL
  Filled 2017-09-29: qty 1

## 2017-09-29 MED ORDER — FLUOXETINE HCL 20 MG PO CAPS
20.0000 mg | ORAL_CAPSULE | Freq: Every day | ORAL | Status: DC
  Administered 2017-09-30 – 2017-10-04 (×5): 20 mg via ORAL
  Filled 2017-09-29 (×5): qty 1

## 2017-09-29 MED ORDER — TRAZODONE HCL 100 MG PO TABS
100.0000 mg | ORAL_TABLET | Freq: Every evening | ORAL | Status: DC | PRN
  Administered 2017-09-29: 100 mg via ORAL
  Filled 2017-09-29: qty 1

## 2017-09-29 MED ORDER — LISINOPRIL 20 MG PO TABS: 10.0000 mg | ORAL_TABLET | Freq: Every day | ORAL | Status: DC

## 2017-09-29 MED ORDER — GABAPENTIN 300 MG PO CAPS
300.0000 mg | ORAL_CAPSULE | Freq: Three times a day (TID) | ORAL | Status: DC
  Administered 2017-09-29 (×2): 300 mg via ORAL
  Filled 2017-09-29 (×4): qty 1

## 2017-09-29 MED ORDER — HYDROXYZINE HCL 25 MG PO TABS
25.0000 mg | ORAL_TABLET | Freq: Four times a day (QID) | ORAL | Status: AC | PRN
  Administered 2017-09-30: 25 mg via ORAL
  Filled 2017-09-29: qty 1

## 2017-09-29 MED ORDER — FLUOXETINE HCL 20 MG PO CAPS
20.0000 mg | ORAL_CAPSULE | Freq: Every day | ORAL | Status: DC
  Administered 2017-09-29: 20 mg via ORAL
  Filled 2017-09-29: qty 1

## 2017-09-29 MED ORDER — GABAPENTIN 300 MG PO CAPS: 300.0000 mg | ORAL_CAPSULE | Freq: Three times a day (TID) | ORAL | Status: DC

## 2017-09-29 MED ORDER — VITAMIN B-1 100 MG PO TABS
100.0000 mg | ORAL_TABLET | Freq: Every day | ORAL | Status: DC
  Administered 2017-09-30 – 2017-10-04 (×5): 100 mg via ORAL
  Filled 2017-09-29 (×5): qty 1

## 2017-09-29 MED ORDER — GABAPENTIN 300 MG PO CAPS
300.0000 mg | ORAL_CAPSULE | Freq: Three times a day (TID) | ORAL | Status: DC
  Filled 2017-09-29: qty 1

## 2017-09-29 MED ORDER — LOPERAMIDE HCL 2 MG PO CAPS: 2.0000 mg | ORAL_CAPSULE | ORAL | Status: AC | PRN

## 2017-09-29 MED ORDER — TRAZODONE HCL 100 MG PO TABS: 100.0000 mg | ORAL_TABLET | Freq: Every evening | ORAL | Status: DC | PRN

## 2017-09-29 MED ORDER — FOLIC ACID 1 MG PO TABS: 1.0000 mg | ORAL_TABLET | Freq: Every day | ORAL | Status: DC

## 2017-09-29 MED ORDER — LISINOPRIL 10 MG PO TABS
10.0000 mg | ORAL_TABLET | Freq: Every day | ORAL | Status: DC
  Administered 2017-09-29: 10 mg via ORAL
  Filled 2017-09-29: qty 1

## 2017-09-29 MED ORDER — PHENYTOIN SODIUM EXTENDED 100 MG PO CAPS
100.0000 mg | ORAL_CAPSULE | Freq: Three times a day (TID) | ORAL | Status: DC
  Administered 2017-09-29 (×3): 100 mg via ORAL
  Filled 2017-09-29 (×3): qty 1

## 2017-09-29 MED ORDER — VITAMIN B-1 100 MG PO TABS
100.0000 mg | ORAL_TABLET | Freq: Every day | ORAL | Status: DC
  Administered 2017-09-29: 100 mg via ORAL
  Filled 2017-09-29: qty 1

## 2017-09-29 MED ORDER — ONDANSETRON 4 MG PO TBDP: 4.0000 mg | ORAL_TABLET | Freq: Four times a day (QID) | ORAL | Status: AC | PRN

## 2017-09-29 MED ORDER — ALUM & MAG HYDROXIDE-SIMETH 200-200-20 MG/5ML PO SUSP: 30.0000 mL | ORAL | Status: DC | PRN

## 2017-09-29 MED ORDER — LISINOPRIL 20 MG PO TABS
10.0000 mg | ORAL_TABLET | Freq: Every day | ORAL | Status: DC
  Administered 2017-09-30 – 2017-10-04 (×5): 10 mg via ORAL
  Filled 2017-09-29 (×5): qty 1

## 2017-09-29 MED ORDER — MAGNESIUM HYDROXIDE 400 MG/5ML PO SUSP: 30.0000 mL | Freq: Every day | ORAL | Status: DC | PRN

## 2017-09-29 MED ORDER — FOLIC ACID 1 MG PO TABS
1.0000 mg | ORAL_TABLET | Freq: Every day | ORAL | Status: DC
  Administered 2017-09-30 – 2017-10-04 (×5): 1 mg via ORAL
  Filled 2017-09-29 (×5): qty 1

## 2017-09-29 MED ORDER — TRAZODONE HCL 100 MG PO TABS
100.0000 mg | ORAL_TABLET | Freq: Every evening | ORAL | Status: DC | PRN
  Filled 2017-09-29: qty 1

## 2017-09-29 MED ORDER — LORAZEPAM 1 MG PO TABS
1.0000 mg | ORAL_TABLET | Freq: Four times a day (QID) | ORAL | Status: DC | PRN
  Administered 2017-09-30: 1 mg via ORAL
  Filled 2017-09-29: qty 1

## 2017-09-29 MED ORDER — THIAMINE HCL 100 MG/ML IJ SOLN
100.0000 mg | Freq: Once | INTRAMUSCULAR | Status: DC
  Filled 2017-09-29: qty 1

## 2017-09-29 MED ORDER — PHENYTOIN SODIUM EXTENDED 100 MG PO CAPS
100.0000 mg | ORAL_CAPSULE | Freq: Three times a day (TID) | ORAL | Status: DC
  Administered 2017-09-30 – 2017-10-04 (×13): 100 mg via ORAL
  Filled 2017-09-29 (×14): qty 1

## 2017-09-29 MED ORDER — FLUOXETINE HCL 20 MG PO CAPS: 20.0000 mg | ORAL_CAPSULE | Freq: Every day | ORAL | Status: DC

## 2017-09-29 MED ORDER — LORAZEPAM 2 MG PO TABS
2.0000 mg | ORAL_TABLET | Freq: Once | ORAL | Status: AC
  Administered 2017-09-29: 2 mg via ORAL
  Filled 2017-09-29: qty 1

## 2017-09-29 MED ORDER — VITAMIN B-1 100 MG PO TABS: 100.0000 mg | ORAL_TABLET | Freq: Every day | ORAL | Status: DC

## 2017-09-29 MED ORDER — PHENYTOIN SODIUM EXTENDED 100 MG PO CAPS: 100.0000 mg | ORAL_CAPSULE | Freq: Three times a day (TID) | ORAL | Status: DC

## 2017-09-29 MED ORDER — ADULT MULTIVITAMIN W/MINERALS CH
1.0000 | ORAL_TABLET | Freq: Every day | ORAL | Status: DC
  Administered 2017-09-30 – 2017-10-04 (×5): 1 via ORAL
  Filled 2017-09-29 (×5): qty 1

## 2017-09-29 NOTE — ED Notes (Signed)
Pt states "I'm withdrawaling from alcohol and I need some medication"  I asked pt if he was suicidal today he said "let's say no"  Pt has no specific plan, but did talk about how he took an overdose of his dilantin to kill himself and stated "I shouldn't have done that".

## 2017-09-29 NOTE — ED Notes (Signed)
Breakfast tray placed in room, pt asleep at this time.

## 2017-09-29 NOTE — ED Notes (Signed)
SOC to rescind IVC; SW

## 2017-09-29 NOTE — ED Notes (Signed)
SOC to rescind IVC; SW  

## 2017-09-29 NOTE — BH Assessment (Signed)
[facility]. Writer spoke with Attending Physican, Dr. Maryruth BunKapur and she will write admission orders.  [facility] Dr[facility] Mary[facility] BunKapur[facility]  Attending Physician will be Dr. Johnella MoloneyMcNew.   Patient [facility]   Intake Paper Work has been signed and placed on patient chart.  ER staff is aware of the admission:  Ronnie, ER Kathrynn SpeedSectary   Dr. Marisa SeverinSiadecki, ER MD   Darl PikesSusan, Patient's Nurse   Marylene LandAngela, Patient Access.

## 2017-09-29 NOTE — BH Assessment (Signed)
Writer spoke with patient to complete update/reassessment. When asked about SI, patient start crying. He stated he don't have a reason to live and he don't want to be here anymore. Patient denies HI and AV/H.

## 2017-09-29 NOTE — ED Notes (Signed)
Informed Dr. Darnelle CatalanMalinda patient's home medications had not been ordered.

## 2017-09-29 NOTE — ED Provider Notes (Signed)
-----------------------------------------   6:41 AM on 09/29/2017 -----------------------------------------   Blood pressure (!) 158/91, pulse 80, temperature 98.2 F (36.8 C), temperature source Oral, resp. rate 16, height 5\' 8"  (1.727 m), weight 63.5 kg, SpO2 100 %.  The patient had no acute events since last update.  Calm and cooperative at this time.  Disposition is pending Psychiatry/Behavioral Medicine/CSW team recommendations.     Irean HongSung, Hortencia Martire J, MD 09/29/17 [phone]

## 2017-09-30 ENCOUNTER — Encounter: Payer: Self-pay | Admitting: Psychiatry

## 2017-09-30 ENCOUNTER — Other Ambulatory Visit: Payer: Self-pay

## 2017-09-30 DIAGNOSIS — F332 Major depressive disorder, recurrent severe without psychotic features: Principal | ICD-10-CM

## 2017-09-30 LAB — T4, FREE: Free T4: 0.69 ng/dL — ABNORMAL LOW (ref 0.82–1.77)

## 2017-09-30 LAB — PHENYTOIN LEVEL, TOTAL: PHENYTOIN LVL: 16.4 ug/mL (ref 10.0–20.0)

## 2017-09-30 LAB — TSH: TSH: 5.992 u[IU]/mL — ABNORMAL HIGH (ref 0.350–4.500)

## 2017-09-30 MED ORDER — ENSURE ENLIVE PO LIQD
237.0000 mL | Freq: Three times a day (TID) | ORAL | Status: DC
  Administered 2017-09-30 – 2017-10-03 (×8): 237 mL via ORAL

## 2017-09-30 MED ORDER — GABAPENTIN 300 MG PO CAPS
600.0000 mg | ORAL_CAPSULE | Freq: Three times a day (TID) | ORAL | Status: DC
  Administered 2017-09-30 – 2017-10-04 (×13): 600 mg via ORAL
  Filled 2017-09-30 (×13): qty 2

## 2017-09-30 MED ORDER — TRAZODONE HCL 100 MG PO TABS
100.0000 mg | ORAL_TABLET | Freq: Every evening | ORAL | Status: DC | PRN
  Administered 2017-10-02: 100 mg via ORAL
  Filled 2017-09-30 (×2): qty 1

## 2017-09-30 MED ORDER — NICOTINE 21 MG/24HR TD PT24
21.0000 mg | MEDICATED_PATCH | Freq: Every day | TRANSDERMAL | Status: DC
  Filled 2017-09-30 (×3): qty 1

## 2017-09-30 MED ORDER — QUETIAPINE FUMARATE 100 MG PO TABS
100.0000 mg | ORAL_TABLET | Freq: Every day | ORAL | Status: DC
  Administered 2017-09-30: 100 mg via ORAL
  Filled 2017-09-30: qty 1

## 2017-09-30 MED ORDER — CHLORDIAZEPOXIDE HCL 25 MG PO CAPS
50.0000 mg | ORAL_CAPSULE | Freq: Four times a day (QID) | ORAL | Status: DC | PRN
  Administered 2017-09-30 (×2): 50 mg via ORAL
  Filled 2017-09-30 (×2): qty 2

## 2017-09-30 NOTE — Progress Notes (Signed)
Patient ID: Timoteo AceWilliam Foskett, male   DOB: Apr 07, 1965, 52 y.o.   MRN: [id number]  CSW completed Regional Referral Form and faxed to [employer] in order to obtain prior authorization number needed to complete [facility] referral. CSW will follow up as needed with [employer], and will fax referral to [facility] upon receipt of authorization number.   Heidi DachKelsey Ashunti Schofield, MSW, LCSW Clinical Social Worker 09/30/2017 1:47 PM

## 2017-09-30 NOTE — Progress Notes (Signed)
NUTRITION ASSESSMENT  Pt identified as at risk on the Malnutrition Screen Tool  INTERVENTION: Ensure Enlive po TID, each supplement provides 350 kcal and 20 grams of protein. Can decrease to BID once PO intake improves.  Continue MVI daily, thiamine 465 mg daily, folic acid 1 mg daily.  NUTRITION DIAGNOSIS: Unintentional weight loss related to sub-optimal intake as evidenced by pt report.   Goal: Pt to meet >/= 90% of their estimated nutrition needs.  Monitor:  PO intake  Assessment:  52 y.o. male with PMHx of EtOH abuse, seizures, COPD, depression who is involuntarily admitted secondary to increased depression and suicidal ideations.  Met with patient in his room. He reports he has had a poor appetite for a few days now. He is unsure what is causing his poor appetite. He did not eat any breakfast or lunch today. He is unable to provide a good nutrition history on his typical intake. Patient is amenable to drinking vanilla or strawberry Ensure Enlive to help meet his calorie/protein needs. He reports he is allergic to chocolate. After discussion on importance of nutrition he is amenable to try to eat some of his dinner later tonight.  Did note in progress note that he is currently homeless. Limited access to food may be contributing to poor PO intake and weight loss.  He reports his UBW is 150 lbs and that he has lost 9 lbs (6% body weight) over an unknown time frame.  Medications reviewed and include: folic acid 1 mg daily, MVI daily, nicotine patch, thiamine 100 mg daily.  Labs reviewed.  Height: Ht Readings from Last 1 Encounters:  09/30/17 5' 7"  (1.702 m)    Weight: Wt Readings from Last 1 Encounters:  09/30/17 64 kg    Weight Hx: Wt Readings from Last 10 Encounters:  09/30/17 64 kg  09/27/17 63.5 kg  09/26/17 63.5 kg  09/23/17 65.2 kg  08/25/17 63.5 kg  08/15/17 63.5 kg  08/10/17 63.5 kg  08/10/17 63.5 kg  08/08/17 63.5 kg  08/01/17 61.2 kg    BMI:  Body  mass index is 22.08 kg/m. Pt meets criteria for normal weight based on current BMI.  Estimated Nutritional Needs: Kcal: 25-30 kcal/kg Protein: > 1 gram protein/kg Fluid: 1 ml/kcal  Diet Order:  Diet Order            Diet regular Room service appropriate? Yes; Fluid consistency: Thin  Diet effective now             Pt is also offered choice of unit snacks mid-morning and mid-afternoon.   Willey Blade, MS, [place], LDN Office: [phone] Pager: [phone] After Hours/Weekend Pager: [phone]

## 2017-09-30 NOTE — Progress Notes (Signed)
Recreation Therapy Notes  INPATIENT RECREATION THERAPY ASSESSMENT  Patient Details Name: Rodney Watson MRN: [id number] DOB: Jan 10, 1966 Today's Date: 09/30/2017       Information Obtained From: (Patient refused assessment)  Able to Participate in Assessment/Interview:    Patient Presentation:    Reason for Admission (Per Patient):    Patient Stressors:    Coping Skills:      Leisure Interests (2+):     Frequency of Recreation/Participation:    Awareness of Community Resources:     [employer] Resources:     Current Use:    If no, Barriers?:    Expressed Interest in [employer]Community Resource Information:    [place]     Patient Main Form of Transportation:    Patient Strengths:     Patient Identified Areas of Improvement:     Patient Goal for Hospitalization:     Current SI (including self-harm):     Current HI:     Current AVH:    Staff Intervention Plan:    Consent to Intern Participation:    Ricci Paff 09/30/2017, 1:46 PM

## 2017-09-30 NOTE — [facility] Suicide Risk Assessment (Signed)
[facility] INPATIENT:  Family/Significant Other Suicide Prevention Education  Suicide Prevention Education:  Patient Refusal for Family/Significant Other Suicide Prevention Education: The patient Rodney Watson has refused to provide written consent for family/significant other to be provided Family/Significant Other Suicide Prevention Education during admission and/or prior to discharge.  Physician notified.  Heidi DachKelsey Wyonia Fontanella, LCSW 09/30/2017, 11:22 AM
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
## 2017-09-30 NOTE — Progress Notes (Signed)
Recreation Therapy Notes   Date: 09/30/2017  Time: 9:30 am   Location: Craft Room   Behavioral response: N/A   Intervention Topic: Problem Solving  Discussion/Intervention: Patient did not attend group.   Clinical Observations/Feedback:  Patient did not attend group.   Rodney Watson LRT/CTRS        [name] 09/30/2017 11:47 AM 
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
## 2017-09-30 NOTE — Plan of Care (Signed)
Newly admitted. Anxious and depressed. MD evaluation pending

## 2017-09-30 NOTE — Tx Team (Signed)
Initial Treatment Plan 09/30/2017 12:53 AM Rodney Watson [id number]    PATIENT STRESSORS: Financial difficulties Substance abuse Other: homelessness   PATIENT STRENGTHS: Ability for insight Average or above average intelligence Communication skills General fund of knowledge   PATIENT IDENTIFIED PROBLEMS: Depression  Alcohol Abuse                   DISCHARGE CRITERIA:  Ability to meet basic life and health needs Adequate post-discharge living arrangements Improved stabilization in mood, thinking, and/or behavior  PRELIMINARY DISCHARGE PLAN: Attend aftercare/continuing care group Outpatient therapy Placement in alternative living arrangements  PATIENT/FAMILY INVOLVEMENT: This treatment plan has been presented to and reviewed with the patient, Rodney Watson. The patient has been given the opportunity to ask questions and make suggestions.  Olin PiaByungura, Kenishia Plack, RN 09/30/2017, 12:53 AM

## 2017-09-30 NOTE — Tx Team (Addendum)
Interdisciplinary Treatment and Diagnostic Plan Update  09/30/2017 Time of Session: 1045 Rodney Watson MRN: [id number]  Principal Diagnosis: <principal problem not specified>  Secondary Diagnoses: Active Problems:   Major depressive disorder, recurrent episode, severe (HCC)   Current Medications:  Current Facility-Administered Medications  Medication Dose Route Frequency Provider Last Rate Last Dose  . alum & mag hydroxide-simeth (MAALOX/MYLANTA) 200-200-20 MG/5ML suspension 30 mL  30 mL Oral Q4H PRN Darliss RidgelKapur, Aarti K, MD      . chlordiazePOXIDE (LIBRIUM) capsule 50 mg  50 mg Oral Q6H PRN Haskell RilingMcNew, Holly R, MD   50 mg at 09/30/17 0846  . FLUoxetine (PROZAC) capsule 20 mg  20 mg Oral Daily Darliss RidgelKapur, Aarti K, MD   20 mg at 09/30/17 0845  . folic acid (FOLVITE) tablet 1 mg  1 mg Oral Daily Darliss RidgelKapur, Aarti K, MD   1 mg at 09/30/17 0845  . gabapentin (NEURONTIN) capsule 600 mg  600 mg Oral TID Haskell RilingMcNew, Holly R, MD   600 mg at 09/30/17 0845  . hydrOXYzine (ATARAX/VISTARIL) tablet 25 mg  25 mg Oral Q6H PRN Darliss RidgelKapur, Aarti K, MD   25 mg at 09/30/17 0003  . lisinopril (PRINIVIL,ZESTRIL) tablet 10 mg  10 mg Oral Daily Darliss RidgelKapur, Aarti K, MD   10 mg at 09/30/17 0846  . loperamide (IMODIUM) capsule 2-4 mg  2-4 mg Oral PRN Darliss RidgelKapur, Aarti K, MD      . magnesium hydroxide (MILK OF MAGNESIA) suspension 30 mL  30 mL Oral Daily PRN Darliss RidgelKapur, Aarti K, MD      . multivitamin with minerals tablet 1 tablet  1 tablet Oral Daily Darliss RidgelKapur, Aarti K, MD   1 tablet at 09/30/17 0844  . ondansetron (ZOFRAN-ODT) disintegrating tablet 4 mg  4 mg Oral Q6H PRN Darliss RidgelKapur, Aarti K, MD      . phenytoin (DILANTIN) ER capsule 100 mg  100 mg Oral TID Darliss RidgelKapur, Aarti K, MD   100 mg at 09/30/17 0845  . QUEtiapine (SEROQUEL) tablet 100 mg  100 mg Oral QHS McNew, Holly R, MD      . thiamine (B-1) injection 100 mg  100 mg Intramuscular Once Darliss RidgelKapur, Aarti K, MD      . thiamine (VITAMIN B-1) tablet 100 mg  100 mg Oral Daily Darliss RidgelKapur, Aarti K, MD   100 mg at 09/30/17 0845   . traZODone (DESYREL) tablet 100 mg  100 mg Oral QHS PRN McNew, Ileene HutchinsonHolly R, MD       PTA Medications: Medications Prior to Admission  Medication Sig Dispense Refill Last Dose  . FLUoxetine (PROZAC) 20 MG capsule Take 1 capsule (20 mg total) by mouth daily. 30 capsule 0 unknown at unknown  . folic acid (FOLVITE) 1 MG tablet Take 1 tablet (1 mg total) by mouth daily. 30 tablet 0 unknown at unknown  . gabapentin (NEURONTIN) 300 MG capsule Take 1 capsule (300 mg total) by mouth 3 (three) times daily. 90 capsule 0 unknown at unknown  . lisinopril (PRINIVIL,ZESTRIL) 10 MG tablet Take 1 tablet (10 mg total) by mouth daily. For high blood pressure 30 tablet 0 unknown at unknown  . phenytoin (DILANTIN) 100 MG ER capsule Take 1 capsule (100 mg total) by mouth 3 (three) times daily. 90 capsule 1 unknown at unknown  . thiamine 100 MG tablet Take 1 tablet (100 mg total) by mouth daily. 30 tablet 0 unknown at unknown  . traZODone (DESYREL) 100 MG tablet Take 1 tablet (100 mg total) by mouth at bedtime and may repeat  dose one time if needed. 30 tablet 0 unknown at unknown    Patient Stressors: Financial difficulties Substance abuse Other: homelessness  Patient Strengths: Ability for insight Average or above average intelligence Communication skills General fund of knowledge  Treatment Modalities: Medication Management, Group therapy, Case management,  1 to 1 session with clinician, Psychoeducation, Recreational therapy.   Physician Treatment Plan for Primary Diagnosis: <principal problem not specified> Long Term Goal(s):     Short Term Goals:    Medication Management: Evaluate patient's response, side effects, and tolerance of medication regimen.  Therapeutic Interventions: 1 to 1 sessions, Unit Group sessions and Medication administration.  Evaluation of Outcomes: Not Progressing  Physician Treatment Plan for Secondary Diagnosis: Active Problems:   Major depressive disorder, recurrent  episode, severe (HCC)  Long Term Goal(s):     Short Term Goals:       Medication Management: Evaluate patient's response, side effects, and tolerance of medication regimen.  Therapeutic Interventions: 1 to 1 sessions, Unit Group sessions and Medication administration.  Evaluation of Outcomes: Not Progressing   RN Treatment Plan for Primary Diagnosis: <principal problem not specified> Long Term Goal(s): Knowledge of disease and therapeutic regimen to maintain health will improve  Short Term Goals: Ability to participate in decision making will improve, Ability to identify and develop effective coping behaviors will improve and Compliance with prescribed medications will improve  Medication Management: RN will administer medications as ordered by provider, will assess and evaluate patient's response and provide education to patient for prescribed medication. RN will report any adverse and/or side effects to prescribing provider.  Therapeutic Interventions: 1 on 1 counseling sessions, Psychoeducation, Medication administration, Evaluate responses to treatment, Monitor vital signs and CBGs as ordered, Perform/monitor CIWA, COWS, AIMS and Fall Risk screenings as ordered, Perform wound care treatments as ordered.  Evaluation of Outcomes: Not Progressing   LCSW Treatment Plan for Primary Diagnosis: <principal problem not specified> Long Term Goal(s): Safe transition to appropriate next level of care at discharge, Engage patient in therapeutic group addressing interpersonal concerns.  Short Term Goals: Engage patient in aftercare planning with referrals and resources, Increase emotional regulation and Increase skills for wellness and recovery  Therapeutic Interventions: Assess for all discharge needs, 1 to 1 time with Social worker, Explore available resources and support systems, Assess for adequacy in community support network, Educate family and significant other(s) on suicide prevention,  Complete Psychosocial Assessment, Interpersonal group therapy.  Evaluation of Outcomes: Not Progressing   Progress in Treatment: Attending groups: No. Participating in groups: No. Taking medication as prescribed: Yes. Toleration medication: Yes. Family/Significant other contact made: No, will contact:  when pt provides consent Patient understands diagnosis: Yes. Discussing patient identified problems/goals with staff: Yes. Medical problems stabilized or resolved: Yes. Denies suicidal/homicidal ideation: Yes. Issues/concerns per patient self-inventory: No. Other: None at this time.   New problem(s) identified: No, Describe:  None at this time.  New Short Term/Long Term Goal(s): Pt declined to attend tx team to discuss his tx goals.   Patient Goals:  Pt declined to attend tx team to discuss his tx goals.   Discharge Plan or Barriers: Pt is currently homeless and does not have a tx provider. CSW will continue to assess for appropriate discharge plan.   Reason for Continuation of Hospitalization: Depression Medication stabilization  Estimated Length of Stay: 5-7 days Recreational Therapy: Patient Stressors: N/A  Patient Goal: Patient will engage in groups without prompting or encouragement from LRT x3 group sessions within 5 recreation therapy group sessions  Attendees: Patient: Pt declined to attend tx team.  09/30/2017 10:53 AM  Physician: Dr. Johnella MoloneyMcNew, MD 09/30/2017 10:53 AM  Nursing:  09/30/2017 10:53 AM  RN Care Manager: 09/30/2017 10:53 AM  Social Worker: Heidi DachKelsey Craig, LCSW  09/30/2017 10:53 AM  Recreational Therapist: Garret ReddishShay Charyl Minervini, CTRS-LRT 09/30/2017 10:53 AM  Other: Johny Shearsassandra Jarrett, LCSWA 09/30/2017 10:53 AM  Other: Damian LeavellJohn Mullins, Chaplin  09/30/2017 10:53 AM  Other: 09/30/2017 10:53 AM    Scribe for Treatment Team: Heidi DachKelsey Craig, LCSW 09/30/2017 10:53 AM
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
## 2017-09-30 NOTE — Progress Notes (Signed)
Received Rodney Watson this Am in his room asleep, he was awaken for assessment and vital signs. He refused breakfast and lunch, but was given orange x three this AM. He has been medicated her order. He endorsed feeling anxious 9/10 and depressed 8/10, but denied feeling suicidal. He has remained in his room, isolated. He was OOB for dinner, ate well and socialized briefly with a male peer. He was medications compliant and feeling better.

## 2017-09-30 NOTE — [facility] Group Notes (Signed)
09/30/2017 1PM  Type of Therapy and Topic:  Group Therapy:  Overcoming Obstacles  Participation Level:  Did Not Attend    Description of Group:    In this group patients will be encouraged to explore what they see as obstacles to their own wellness and recovery. They will be guided to discuss their thoughts, feelings, and behaviors related to these obstacles. The group will process together ways to cope with barriers, with attention given to specific choices patients can make. Each patient will be challenged to identify changes they are motivated to make in order to overcome their obstacles. This group will be process-oriented, with patients participating in exploration of their own experiences as well as giving and receiving support and challenge from other group members.   Therapeutic Goals: 1. Patient will identify personal and current obstacles as they relate to admission. 2. Patient will identify barriers that currently interfere with their wellness or overcoming obstacles.  3. Patient will identify feelings, thought process and behaviors related to these barriers. 4. Patient will identify two changes they are willing to make to overcome these obstacles:      Summary of Patient Progress Patient was encouraged and invited to attend group. Patient did not attend group. Social worker will continue to encourage group participation in the future.      Therapeutic Modalities:   Cognitive Behavioral Therapy Solution Focused Therapy Motivational Interviewing Relapse Prevention Therapy    Johny ShearsCassandra Jamey Demchak MSW, LCSWA 09/30/2017 2:51 PM

## 2017-09-30 NOTE — [facility] Suicide Risk Assessment (Signed)
[facility]   Nursing information obtained from:  Patient Demographic factors:  Male, Caucasian, Low socioeconomic status, Unemployed Current Mental Status:  NA Loss Factors:  Financial problems / change in socioeconomic status Historical Factors:  NA(none) Risk Reduction Factors:  Positive therapeutic relationship  Total Time spent with patient: 45 minutes Principal Problem: Major depressive disorder, recurrent episode, severe (HCC) Diagnosis:   Patient Active Problem List   Diagnosis Date Noted  . Major depressive disorder, recurrent episode, severe (HCC) [F33.2] 09/29/2017    Priority: High  . Alcohol abuse with alcohol-induced mood disorder (HCC) [F10.14] 04/10/2017    Priority: High  . Alcohol withdrawal (HCC) [F10.239] 07/18/2017  . Seizure disorder (HCC) [G40.909] 07/18/2017  . Essential hypertension [I10] 07/18/2017   Subjective Data: See H&P  Continued Clinical Symptoms:  Alcohol Use Disorder Identification Test Final Score (AUDIT): 32 The "Alcohol Use Disorders Identification Test", Guidelines for Use in Primary Care, Second Edition.  World Science writerHealth [employer] [facility](WHO). Score between 0-7:  no or low risk or alcohol related problems. Score between 8-15:  moderate risk of alcohol related problems. Score between 16-19:  high risk of alcohol related problems. Score 20 or above:  warrants further diagnostic evaluation for alcohol dependence and treatment.   CLINICAL FACTORS:   Alcohol/Substance Abuse/Dependencies       COGNITIVE FEATURES THAT CONTRIBUTE TO RISK:  None    SUICIDE RISK:   Moderate:  Frequent suicidal ideation with limited intensity, and duration, some specificity in terms of plans, no associated intent, good self-control, limited dysphoria/symptomatology, some risk factors present, and identifiable protective factors, including available and accessible social support.  PLAN OF CARE: See H&P  I certify that inpatient services  furnished can reasonably be expected to improve the patient's condition.   Haskell RilingHolly R Shaakira Borrero, MD 09/30/2017, 2:11 PM

## 2017-10-01 MED ORDER — IBUPROFEN 600 MG PO TABS
600.0000 mg | ORAL_TABLET | Freq: Four times a day (QID) | ORAL | Status: DC | PRN
  Administered 2017-10-01 – 2017-10-03 (×2): 600 mg via ORAL
  Filled 2017-10-01 (×2): qty 1

## 2017-10-01 MED ORDER — NICOTINE POLACRILEX 2 MG MT GUM
2.0000 mg | CHEWING_GUM | OROMUCOSAL | Status: DC | PRN
Start: 2017-10-01 — End: 2017-10-04
  Administered 2017-10-01 – 2017-10-04 (×7): 2 mg via ORAL
  Filled 2017-10-01 (×7): qty 1

## 2017-10-01 MED ORDER — LEVOTHYROXINE SODIUM 50 MCG PO TABS
25.0000 ug | ORAL_TABLET | Freq: Every day | ORAL | Status: DC
  Administered 2017-10-01 – 2017-10-02 (×2): 25 ug via ORAL
  Filled 2017-10-01 (×3): qty 1

## 2017-10-01 MED ORDER — QUETIAPINE FUMARATE 100 MG PO TABS
150.0000 mg | ORAL_TABLET | Freq: Every day | ORAL | Status: DC
  Administered 2017-10-01 – 2017-10-03 (×3): 150 mg via ORAL
  Filled 2017-10-01 (×3): qty 2

## 2017-10-01 NOTE — Progress Notes (Signed)
Recreation Therapy Notes          Rodney Watson 10/01/2017 1:52 PM

## 2017-10-01 NOTE — Progress Notes (Signed)
Received Rodney Watson this AM after breakfast, he was compliant with his medications. He is OOB in the milieu today with moderate amount of socialization. He is feeling anxious and depressed and hoping for a positive outcome. On his self inventory sheet hopelessness was rated 8/10. He is trying not to think about suicide and use better coping thoughts.

## 2017-10-01 NOTE — Progress Notes (Signed)
Patient ID: Rodney Watson, male   DOB: 08-Dec-1965, 52 y.o.   MRN: [id number]  [facility] who reported pt was accepted into their program. Rodney Watson reported pt needs to report to [facility]ney Watson admissions on Friday, 10/04/17 at 10AM. CSW will follow up with Rodney Watson as needed.   Rodney Watson, MSW, LCSW Clinical Social Worker 10/01/2017 2:38 PM

## 2017-10-01 NOTE — [facility] Group Notes (Signed)
CSW Group Therapy Note  10/01/2017  Time:  0900  Type of Therapy and Topic: Group Therapy: Goals Group: SMART Goals    Participation Level:  Did Not Attend    Description of Group:   The purpose of a daily goals group is to assist and guide patients in setting recovery/wellness-related goals. The objective is to set goals as they relate to the crisis in which they were admitted. Patients will be using SMART goal modalities to set measurable goals. Characteristics of realistic goals will be discussed and patients will be assisted in setting and processing how one will reach their goal. Facilitator will also assist patients in applying interventions and coping skills learned in psycho-education groups to the SMART goal and process how one will achieve defined goal.    Therapeutic Goals:  -Patients will develop and document one goal related to or their crisis in which brought them into treatment.  -Patients will be guided by LCSW using SMART goal setting modality in how to set a measurable, attainable, realistic and time sensitive goal.  -Patients will process barriers in reaching goal.  -Patients will process interventions in how to overcome and successful in reaching goal.    Patient's Goal:  Pt was invited to attend group but chose not to attend. CSW will continue to encourage pt to attend group throughout their admission.   Therapeutic Modalities:  Motivational Interviewing  Cognitive Behavioral Therapy  Crisis Intervention Model  SMART goals setting  Heidi DachKelsey Renalda Locklin, MSW, LCSW Clinical Social Worker 10/01/2017 10:25 AM

## 2017-10-01 NOTE — [facility] Group Notes (Signed)
10/01/2017 1PM  Type of Therapy/Topic:  Group Therapy:  Feelings about Diagnosis  Participation Level:  Active   Description of Group:   This group will allow patients to explore their thoughts and feelings about diagnoses they have received. Patients will be guided to explore their level of understanding and acceptance of these diagnoses. Facilitator will encourage patients to process their thoughts and feelings about the reactions of others to their diagnosis and will guide patients in identifying ways to discuss their diagnosis with significant others in their lives. This group will be process-oriented, with patients participating in exploration of their own experiences, giving and receiving support, and processing challenge from other group members.   Therapeutic Goals: 1. Patient will demonstrate understanding of diagnosis as evidenced by identifying two or more symptoms of the disorder 2. Patient will be able to express two feelings regarding the diagnosis 3. Patient will demonstrate their ability to communicate their needs through discussion and/or role play  Summary of Patient Progress: Actively and appropriately engaged in the group. Patient was able to provide support and validation to other group members.Patient practiced active listening when interacting with the facilitator and other group members. Rodney Watson reports having a symptom of Epilepsy. Patient left before group ended. Patient is still in the process of obtaining treatment goals.        Therapeutic Modalities:   Cognitive Behavioral Therapy Brief Therapy Feelings Identification    Rodney ShearsCassandra  Rodney Guion, LCSW 10/01/2017 2:07 PM

## 2017-10-01 NOTE — Progress Notes (Signed)
[facility]: [id number].   CSW faxed completed referral to [facility] and will follow up as needed.   Heidi DachKelsey Keshawn Fiorito, MSW, LCSW Clinical Social Worker 10/01/2017 8:06 AM

## 2017-10-01 NOTE — Progress Notes (Signed)
[facility] MD Progress Note  10/01/2017 2:27 PM Rodney Watson  MRN:  [id number] Subjective:  Pt is out of his room more today. HE is eating and drinking fluids. He states that he is depressed because of what he has done with his life. He states "I wanna do better. I'm not getting any younger." He states that he is out of money until the first and wants to stay in the hospital until he gets paid again. He discloses that he is a sex offender and has had trouble getting housing because of this. He has been staying with a minister in [place] recently. She told him that he could come back if needed. He is tearful at times. When asked about SI, he starts crying and changes the subject. Supportive therapy provided and discussed that he needs to break this cycle of alcohol use. He was in agreement and still wants to get into [facility]. He states that he has a case worker at [facility] that helps him with food and has bought him a tent in the past. He is organized in thoughts but concentration is low and often loses his train of thinking. He states that he slept better with Seroquel and feels it is helpful.   Principal Problem: Major depressive disorder, recurrent episode, severe (HCC) Diagnosis:   Patient Active Problem List   Diagnosis Date Noted  . Major depressive disorder, recurrent episode, severe (HCC) [F33.2] 09/29/2017    Priority: High  . Alcohol abuse with alcohol-induced mood disorder (HCC) [F10.14] 04/10/2017    Priority: High  . Alcohol withdrawal (HCC) [F10.239] 07/18/2017  . Seizure disorder (HCC) [G40.909] 07/18/2017  . Essential hypertension [I10] 07/18/2017   Total Time spent with patient: 20 minutes  Past Psychiatric History: See h&P  Past Medical History:  Past Medical History:  Diagnosis Date  . Alcohol abuse   . Alcohol abuse 04/01/2017  . COPD (chronic obstructive pulmonary disease) (HCC)   . Depression   . ETOH abuse   . Seizures (HCC)    History reviewed. No pertinent surgical  history. Family History:  Family History  Problem Relation Age of Onset  . Alcohol abuse Father   . Seizures Neg Hx    Family Psychiatric  History: See H&P Social History:  Social History   Substance and Sexual Activity  Alcohol Use Yes   Comment: 5th daily     Social History   Substance and Sexual Activity  Drug Use Yes  . Types: Marijuana   Comment: 1-2 joints/week    Social History   Socioeconomic History  . Marital status: Single    Spouse name: Not on file  . Number of children: Not on file  . Years of education: Not on file  . Highest education level: Not on file  Occupational History  . Not on file  Social Needs  . Financial resource strain: Not on file  . Food insecurity:    Worry: Not on file    Inability: Not on file  . Transportation needs:    Medical: Not on file    Non-medical: Not on file  Tobacco Use  . Smoking status: Current Every Day Smoker    Packs/day: 1.50    Years: 39.00    Pack years: 58.50    Types: Cigarettes  . Smokeless tobacco: Current User    Types: Chew  Substance and Sexual Activity  . Alcohol use: Yes    Comment: 5th daily  . Drug use: Yes    Types: Marijuana  Comment: 1-2 joints/week  . Sexual activity: Never  Lifestyle  . Physical activity:    Days per week: Not on file    Minutes per session: Not on file  . Stress: Not on file  Relationships  . Social connections:    Talks on phone: Not on file    Gets together: Not on file    Attends religious service: Not on file    Active member of club or organization: Not on file    Attends meetings of clubs or organizations: Not on file    Relationship status: Not on file  Other Topics Concern  . Not on file  Social History Narrative   ** Merged History Encounter **       Additional Social History:                         Sleep: Fair  Appetite:  Fair  Current Medications: Current Facility-Administered Medications  Medication Dose Route Frequency  Provider Last Rate Last Dose  . alum & mag hydroxide-simeth (MAALOX/MYLANTA) 200-200-20 MG/5ML suspension 30 mL  30 mL Oral Q4H PRN Darliss RidgelKapur, Aarti K, MD      . chlordiazePOXIDE (LIBRIUM) capsule 50 mg  50 mg Oral Q6H PRN Cerrone Debold, Ileene HutchinsonHolly R, MD   50 mg at 09/30/17 1325  . feeding supplement (ENSURE ENLIVE) (ENSURE ENLIVE) liquid 237 mL  237 mL Oral TID BM Lannie Yusuf, Ileene HutchinsonHolly R, MD   237 mL at 10/01/17 1130  . FLUoxetine (PROZAC) capsule 20 mg  20 mg Oral Daily Darliss RidgelKapur, Aarti K, MD   20 mg at 10/01/17 [id number]  . folic acid (FOLVITE) tablet 1 mg  1 mg Oral Daily Darliss RidgelKapur, Aarti K, MD   1 mg at 10/01/17 [id number]  . gabapentin (NEURONTIN) capsule 600 mg  600 mg Oral TID Haskell RilingMcNew, Bayyinah Dukeman R, MD   600 mg at 10/01/17 1217  . hydrOXYzine (ATARAX/VISTARIL) tablet 25 mg  25 mg Oral Q6H PRN Darliss RidgelKapur, Aarti K, MD   25 mg at 09/30/17 0003  . lisinopril (PRINIVIL,ZESTRIL) tablet 10 mg  10 mg Oral Daily Darliss RidgelKapur, Aarti K, MD   10 mg at 10/01/17 [id number]  . loperamide (IMODIUM) capsule 2-4 mg  2-4 mg Oral PRN Darliss RidgelKapur, Aarti K, MD      . magnesium hydroxide (MILK OF MAGNESIA) suspension 30 mL  30 mL Oral Daily PRN Darliss RidgelKapur, Aarti K, MD      . multivitamin with minerals tablet 1 tablet  1 tablet Oral Daily Darliss RidgelKapur, Aarti K, MD   1 tablet at 10/01/17 [phone]  . nicotine (NICODERM CQ - dosed in mg/24 hours) patch 21 mg  21 mg Transdermal Daily Karman Veney R, MD      . ondansetron (ZOFRAN-ODT) disintegrating tablet 4 mg  4 mg Oral Q6H PRN Darliss RidgelKapur, Aarti K, MD      . phenytoin (DILANTIN) ER capsule 100 mg  100 mg Oral TID Darliss RidgelKapur, Aarti K, MD   100 mg at 10/01/17 1217  . QUEtiapine (SEROQUEL) tablet 150 mg  150 mg Oral QHS Olivia Pavelko R, MD      . thiamine (B-1) injection 100 mg  100 mg Intramuscular Once Darliss RidgelKapur, Aarti K, MD      . thiamine (VITAMIN B-1) tablet 100 mg  100 mg Oral Daily Darliss RidgelKapur, Aarti K, MD   100 mg at 10/01/17 [id number]  . traZODone (DESYREL) tablet 100 mg  100 mg Oral QHS PRN Issam Carlyon, Ileene HutchinsonHolly R, MD  Lab Results:  Results for orders placed or performed  during the hospital encounter of 09/29/17 (from the past 48 hour(s))  TSH     Status: Abnormal   Collection Time: 09/30/17  6:55 AM  Result Value Ref Range   TSH 5.992 (H) 0.350 - 4.500 uIU/mL    Comment: Performed by a 3rd Generation assay with a functional sensitivity of <=0.01 uIU/mL. Performed at [facility], [address] [place], [place] [address]   Phenytoin level, total     Status: None   Collection Time: 09/30/17  6:55 AM  Result Value Ref Range   Phenytoin Lvl 16.4 10.0 - 20.0 ug/mL    Comment: Performed at [facility], [address] [place], [place] [address]  T4, free     Status: Abnormal   Collection Time: 09/30/17  6:55 AM  Result Value Ref Range   Free T4 0.69 (L) 0.82 - 1.77 ng/dL    Comment: (NOTE) Biotin ingestion may interfere with free T4 tests. If the results are inconsistent with the TSH level, previous test results, or the clinical presentation, then consider biotin interference. If needed, order repeat testing after stopping biotin. Performed at [facility], [address] [place], [place] [address]     Blood Alcohol level:  Lab Results  Component Value Date   ETH 157 (H) 09/27/2017   ETH <10 09/26/2017    Metabolic Disorder Labs: No results found for: HGBA1C, MPG No results found for: PROLACTIN No results found for: CHOL, TRIG, HDL, CHOLHDL, VLDL, LDLCALC  Physical Findings: AIMS:  , ,  ,  ,    CIWA:  CIWA-Ar Total: 0 COWS:     Musculoskeletal: Strength & Muscle Tone: within normal limits Gait & Station: normal Patient leans: N/A  Psychiatric Specialty Exam: Physical Exam  Nursing note and vitals reviewed.   Review of Systems  All other systems reviewed and are negative.   Blood pressure 123/83, pulse 81, temperature 98 F (36.7 C), temperature source Oral, resp. rate 16, height 5\' 7"  (1.702 m), weight 64 kg, SpO2 100 %.Body mass index is 22.08 kg/m.  General Appearance: Disheveled  Eye Contact:   Good  Speech:  Clear and Coherent  Volume:  Normal  Mood:  Depressed  Affect:  Appropriate  Thought Process:  Coherent and Goal Directed  Orientation:  Full (Time, Place, and Person)  Thought Content:  Logical  Suicidal Thoughts:  Yes.  without intent/plan  Homicidal Thoughts:  No  Memory:  Immediate;   Fair  Judgement:  Fair  Insight:  Fair  Psychomotor Activity:  Normal  Concentration:  Concentration: Poor  Recall:  [employer] of Knowledge:  Fair  Language:  Fair  Akathisia:  No      Assets:  Resilience  ADL's:  Intact  Cognition:  WNL  Sleep:  Number of Hours: 7.45     Treatment Plan Summary: 52 yo male with chronic homelessness and alcohol use disorder admitted due to SI. He has chronic SI related to homelessness. He is also a sex offender which he states has ruined his life and has been depressed since then. This has made it hard for him to find housing. He spends all of his money on alcohol and is out of money until August 1st. He is still open to [facility] referral.   Plan:  Substance Induced mood disorder -Continue Prozac 20 mg daily -Increase Seroquel to 150 mg daily  History of epilepsy -Dilantin level normal -Continue Dilantin  100 mg TID  Alcohol use disorder and withdrawal -Vitals stable -On CIWA with prn Librium. He received 2 doses of Librium yesterday. None so far today. Ciwa has been 0 so far today -He was accepted into [facility] for Friday  HTN -Lisinopril 10 mg daily  Hypothyroid --TSH elevated at 5.992 and T4 0.69 which may be contributing to depression -Start Synthroid 25 mcg daily  Dispo -Pt will go to [facility] on Friday  Haskell RilingHolly R Maryhelen Lindler, MD 10/01/2017, 2:27 PM

## 2017-10-01 NOTE — Plan of Care (Addendum)
Isolative to room, polite and pleasant on approach, "I was going to get killed by a freight train, that will do it; but I don't know where the railway is; I thought the freight train will do it..." Disheveled, poor  hygiene, encouraged to take a shower but declined; medication compliant.  Patient slept for Estimated Hours of 7.45; Precautionary checks every 15 minutes for safety maintained, room free of safety hazards, patient sustains no injury or falls during this shift.  Problem: Education: Goal: Ability to state activities that reduce stress will improve Outcome: Progressing   Problem: Coping: Goal: Ability to identify and develop effective coping behavior will improve Outcome: Progressing   Problem: Coping: Goal: Coping ability will improve Outcome: Progressing Goal: Will verbalize feelings Outcome: Progressing   Problem: Safety: Goal: Ability to remain free from injury will improve Outcome: Progressing   Problem: Self-Concept: Goal: Ability to disclose and discuss suicidal ideas will improve Outcome: Progressing

## 2017-10-02 MED ORDER — LEVOTHYROXINE SODIUM 25 MCG PO TABS
25.0000 ug | ORAL_TABLET | Freq: Every day | ORAL | Status: DC
  Administered 2017-10-03 – 2017-10-04 (×2): 25 ug via ORAL
  Filled 2017-10-02 (×2): qty 1

## 2017-10-02 NOTE — Plan of Care (Addendum)
Patient found in day room upon my arrival. Patient is visible and social with peers this evening. Patient was pleasant and jovial throughout the early evening. Denies all complaints. At medication time, patient mood is labile. Patient was laughing and joking with peers one minute, then the next he is arguing with Clinical research associatewriter regarding medications. Attempted to give patient 150mg  Quetiapine dose and patient stated that he is to receive 200mg . Educated patient that he has been on 150mg  dose and took it yesterday, patient states, "The doctor said she would take the Trazodone off and put my Seroquel to 200. I took 200 last night." Educated patient that he had received 150mg  of quetiapine last night. "Forget it then. I won't take anything." Writer read MD note. No mention of alleged dosage change. Attempted to speak to patient regarding the importance of compliance with medication regimen.  Patient noted that he had not been rude and Clinical research associatewriter agreed and stressed importance of compliance with medication. Patient came to medication room 15 minutes later and took both quetiapine and Trazodone. Patient was informed that he was not required to take the Trazodone. "No, I'll take it." Patient remained irritable but was compliant with HS medication. Q 15 minute checks maintained. Will continue to monitor throughout the shift. Patient slept 6.75 hours. No apparent distress. Upon waking, compliant with VS and Synthroid. VSS. CIWA 0. Given gum for nicotine WD. Will endorse care to oncoming shift.  Problem: Coping: Goal: Coping ability will improve Outcome: Progressing Goal: Will verbalize feelings Outcome: Progressing   Problem: Education: Goal: Knowledge of [facility] Education information/materials will improve Outcome: Progressing Goal: Emotional status will improve Outcome: Progressing Goal: Mental status will improve Outcome: Progressing   Problem: Self-Concept: Goal: Level of anxiety will  decrease Outcome: Not Progressing

## 2017-10-02 NOTE — Progress Notes (Signed)
Received Rodney Watson this AM after breakfast, he was compliant with his medication.  He is feeling irritable and anxious. Later he approached this writer to announce he is going to [facility] on Friday. This news decreased some of his anxiety. He is OOB at intervals and socializing with his peers.

## 2017-10-02 NOTE — Plan of Care (Signed)
Patient is resting comfortably and compliant with her medical regimen , cooperating , socializing with peers with out issues,  no complain with his medicines that is w0rking well for him, no noticeable side effects noted, patient contract for safety of self and others , denies SI/HI and signs of AVH, 15 minute rounding is maintained.   Problem: Education: Goal: Ability to state activities that reduce stress will improve Outcome: Progressing   Problem: Coping: Goal: Ability to identify and develop effective coping behavior will improve Outcome: Progressing   Problem: Self-Concept: Goal: Ability to identify factors that promote anxiety will improve Outcome: Progressing Goal: Level of anxiety will decrease Outcome: Progressing   Problem: Education: Goal: Utilization of techniques to improve thought processes will improve Outcome: Progressing   Problem: Coping: Goal: Coping ability will improve Outcome: Progressing Goal: Will verbalize feelings Outcome: Progressing   Problem: Education: Goal: Knowledge of [facility] Education information/materials will improve Outcome: Progressing Goal: Emotional status will improve Outcome: Progressing Goal: Mental status will improve Outcome: Progressing Goal: Verbalization of understanding the information provided will improve Outcome: Progressing   Problem: Education: Goal: Knowledge of disease or condition will improve Outcome: Progressing Goal: Understanding of discharge needs will improve Outcome: Progressing   Problem: Health Behavior/Discharge Planning: Goal: Ability to identify changes in lifestyle to reduce recurrence of condition will improve Outcome: Progressing   Problem: Physical Regulation: Goal: Complications related to the disease process, condition or treatment will be avoided or minimized Outcome: Progressing   Problem: Safety: Goal: Ability to remain free from injury will improve Outcome: Progressing    Problem: Coping: Goal: Coping ability will improve Outcome: Progressing   Problem: Self-Concept: Goal: Ability to disclose and discuss suicidal ideas will improve Outcome: Progressing Goal: Will verbalize positive feelings about self Outcome: Progressing

## 2017-10-02 NOTE — Progress Notes (Addendum)
[facility]  10/02/2017 12:40 PM Rodney AceWilliam Watson  MRN:  [id number] Subjective:  Pt states that he is feeling much better today. He states that he slept great with the Seroquel. He states that his mood is improving and he no longer feels like harming himself. He states that he still wants to stop drinking and "do something in society." HE is tearful when talking bout his alcohol use through the years. He states that his goals are to eventually get his own place and "maybe meet a nice lady and have a companion.' He does have brighter affect and is interacting with peers.  Principal Problem: Major depressive disorder, recurrent episode, severe (HCC) Diagnosis:   Patient Active Problem List   Diagnosis Date Noted  . Major depressive disorder, recurrent episode, severe (HCC) [F33.2] 09/29/2017    Priority: High  . Alcohol abuse with alcohol-induced mood disorder (HCC) [F10.14] 04/10/2017    Priority: High  . Alcohol withdrawal (HCC) [F10.239] 07/18/2017  . Seizure disorder (HCC) [G40.909] 07/18/2017  . Essential hypertension [I10] 07/18/2017   Total Time spent with patient: 20 minutes  Past Psychiatric History: See h&P  Past Medical History:  Past Medical History:  Diagnosis Date  . Alcohol abuse   . Alcohol abuse 04/01/2017  . COPD (chronic obstructive pulmonary disease) (HCC)   . Depression   . ETOH abuse   . Seizures (HCC)    History reviewed. No pertinent surgical history. Family History:  Family History  Problem Relation Age of Onset  . Alcohol abuse Father   . Seizures Neg Hx    Family Psychiatric  History: See H&P Social History:  Social History   Substance and Sexual Activity  Alcohol Use Yes   Comment: 5th daily     Social History   Substance and Sexual Activity  Drug Use Yes  . Types: Marijuana   Comment: 1-2 joints/week    Social History   Socioeconomic History  . Marital status: Single    Spouse name: Not on file  . Number of children: Not on file   . Years of education: Not on file  . Highest education level: Not on file  Occupational History  . Not on file  Social Needs  . Financial resource strain: Not on file  . Food insecurity:    Worry: Not on file    Inability: Not on file  . Transportation needs:    Medical: Not on file    Non-medical: Not on file  Tobacco Use  . Smoking status: Current Every Day Smoker    Packs/day: 1.50    Years: 39.00    Pack years: 58.50    Types: Cigarettes  . Smokeless tobacco: Current User    Types: Chew  Substance and Sexual Activity  . Alcohol use: Yes    Comment: 5th daily  . Drug use: Yes    Types: Marijuana    Comment: 1-2 joints/week  . Sexual activity: Never  Lifestyle  . Physical activity:    Days per week: Not on file    Minutes per session: Not on file  . Stress: Not on file  Relationships  . Social connections:    Talks on phone: Not on file    Gets together: Not on file    Attends religious service: Not on file    Active member of club or organization: Not on file    Attends meetings of clubs or organizations: Not on file    Relationship status: Not on file  Other Topics Concern  . Not on file  Social History Narrative   ** Merged History Encounter **       Additional Social History:                         Sleep: Good  Appetite:  Good  Current Medications: Current Facility-Administered Medications  Medication Dose Route Frequency Provider Last Rate Last Dose  . alum & mag hydroxide-simeth (MAALOX/MYLANTA) 200-200-20 MG/5ML suspension 30 mL  30 mL Oral Q4H PRN Darliss RidgelKapur, Aarti K, MD      . chlordiazePOXIDE (LIBRIUM) capsule 50 mg  50 mg Oral Q6H PRN McNew, Ileene HutchinsonHolly R, MD   50 mg at 09/30/17 1325  . feeding supplement (ENSURE ENLIVE) (ENSURE ENLIVE) liquid 237 mL  237 mL Oral TID BM McNew, Ileene HutchinsonHolly R, MD   237 mL at 10/01/17 2138  . FLUoxetine (PROZAC) capsule 20 mg  20 mg Oral Daily Darliss RidgelKapur, Aarti K, MD   20 mg at 10/02/17 0830  . folic acid (FOLVITE) tablet  1 mg  1 mg Oral Daily Darliss RidgelKapur, Aarti K, MD   1 mg at 10/02/17 0830  . gabapentin (NEURONTIN) capsule 600 mg  600 mg Oral TID Haskell RilingMcNew, Holly R, MD   600 mg at 10/02/17 1135  . hydrOXYzine (ATARAX/VISTARIL) tablet 25 mg  25 mg Oral Q6H PRN Darliss RidgelKapur, Aarti K, MD   25 mg at 09/30/17 0003  . ibuprofen (ADVIL,MOTRIN) tablet 600 mg  600 mg Oral Q6H PRN Haskell RilingMcNew, Holly R, MD   600 mg at 10/01/17 1545  . levothyroxine (SYNTHROID, LEVOTHROID) tablet 25 mcg  25 mcg Oral QAC breakfast Haskell RilingMcNew, Holly R, MD   25 mcg at 10/02/17 [id number]  . lisinopril (PRINIVIL,ZESTRIL) tablet 10 mg  10 mg Oral Daily Darliss RidgelKapur, Aarti K, MD   10 mg at 10/02/17 0829  . loperamide (IMODIUM) capsule 2-4 mg  2-4 mg Oral PRN Darliss RidgelKapur, Aarti K, MD      . magnesium hydroxide (MILK OF MAGNESIA) suspension 30 mL  30 mL Oral Daily PRN Darliss RidgelKapur, Aarti K, MD      . multivitamin with minerals tablet 1 tablet  1 tablet Oral Daily Darliss RidgelKapur, Aarti K, MD   1 tablet at 10/02/17 0829  . nicotine (NICODERM CQ - dosed in mg/24 hours) patch 21 mg  21 mg Transdermal Daily McNew, Holly R, MD      . nicotine polacrilex (NICORETTE) gum 2 mg  2 mg Oral PRN Haskell RilingMcNew, Holly R, MD   2 mg at 10/02/17 1137  . ondansetron (ZOFRAN-ODT) disintegrating tablet 4 mg  4 mg Oral Q6H PRN Darliss RidgelKapur, Aarti K, MD      . phenytoin (DILANTIN) ER capsule 100 mg  100 mg Oral TID Darliss RidgelKapur, Aarti K, MD   100 mg at 10/02/17 1138  . QUEtiapine (SEROQUEL) tablet 150 mg  150 mg Oral QHS McNew, Ileene HutchinsonHolly R, MD   150 mg at 10/01/17 2138  . thiamine (B-1) injection 100 mg  100 mg Intramuscular Once Darliss RidgelKapur, Aarti K, MD      . thiamine (VITAMIN B-1) tablet 100 mg  100 mg Oral Daily Darliss RidgelKapur, Aarti K, MD   100 mg at 10/02/17 0836  . traZODone (DESYREL) tablet 100 mg  100 mg Oral QHS PRN McNew, Ileene HutchinsonHolly R, MD        Lab Results: No results found for this or any previous visit (from the past 48 hour(s)).  Blood Alcohol level:  Lab Results  Component Value  Date   ETH 157 (H) 09/27/2017   ETH <10 09/26/2017    Metabolic Disorder  Labs: No results found for: HGBA1C, MPG No results found for: PROLACTIN No results found for: CHOL, TRIG, HDL, CHOLHDL, VLDL, LDLCALC  Physical Findings: AIMS:  , ,  ,  ,    CIWA:  CIWA-Ar Total: 0 COWS:     Musculoskeletal: Strength & Muscle Tone: within normal limits Gait & Station: normal Patient leans: N/A  Psychiatric Specialty Exam: Physical Exam  Nursing note and vitals reviewed.   Review of Systems  All other systems reviewed and are negative.   Blood pressure 115/89, pulse 68, temperature 98 F (36.7 C), temperature source Oral, resp. rate 16, height 5\' 7"  (1.702 m), weight 64 kg, SpO2 100 %.Body mass index is 22.08 kg/m.  General Appearance: Casual  Eye Contact:  Good  Speech:  Clear and Coherent  Volume:  Normal  Mood:  Depressed  Affect:  Congruent but brightens with talking with him, smiling  Thought Process:  Coherent and Goal Directed, some thought blocking at times  Orientation:  Full (Time, Place, and Person)  Thought Content:  Logical  Suicidal Thoughts:  No  Homicidal Thoughts:  No  Memory:  Immediate;   Fair  Judgement:  Fair  Insight:  Fair  Psychomotor Activity:  EPS  Concentration:  Concentration: Fair  Recall:  [employer] of Knowledge:  Fair  Language:  Fair  Akathisia:  No      Assets:  Resilience  ADL's:  Intact  Cognition:  WNL  Sleep:  Number of Hours: 7     Treatment Plan Summary: 52 yo male admitted for SI in the context of alcohol abuse. Mood is improving and SI is resolving. He is excited to go to [facility] on Friday.   Plan:  Substance Induced mood disorder -Continue Prozac 20 mg daily -Continue Seroquel 150 mg qhs  History of epilepsy -Dilantin level normal -Continue Dilantin 100 mg TID  Alcohol use disorder and withdrawal -Vitals stable -Has not receive Librium for 2 days. Will continue CIWA for 1 more day and discontinue tomorrow  HTN -Continue Lisinopril 10 mg daily  Hypothyroid -Continue Synthroid 25 mcg prior  to breakfast  Dispo -he will go to [facility] on Friday   Haskell Riling, MD 10/02/2017, 12:40 PM

## 2017-10-02 NOTE — Progress Notes (Signed)
Recreation Therapy Notes   Date: 10/02/2017  Time: 9:30 am   Location: Craft Room   Behavioral response: N/A   Intervention Topic: Happiness  Discussion/Intervention: Patient did not attend group.   Clinical Observations/Feedback:  Patient did not attend group.   Nattalie Santiesteban LRT/CTRS        Vianney Kopecky 10/02/2017 1:47 PM

## 2017-10-03 MED ORDER — QUETIAPINE FUMARATE 50 MG PO TABS: 150.0000 mg | ORAL_TABLET | Freq: Every day | ORAL | Status: DC

## 2017-10-03 MED ORDER — LEVOTHYROXINE SODIUM 25 MCG PO TABS: 25.0000 ug | ORAL_TABLET | Freq: Every day | ORAL | Status: DC

## 2017-10-03 NOTE — B[facility] Suicide Risk Assessment (Signed)
[facility]   Principal Problem: Major depressive disorder, recurrent episode, severe (HCC) Discharge Diagnoses:  Patient Active Problem List   Diagnosis Date Noted  . Major depressive disorder, recurrent episode, severe (HCC) [F33.2] 09/29/2017    Priority: High  . Alcohol abuse with alcohol-induced mood disorder (HCC) [F10.14] 04/10/2017    Priority: High  . Alcohol withdrawal (HCC) [F10.239] 07/18/2017  . Seizure disorder (HCC) [G40.909] 07/18/2017  . Essential hypertension [I10] 07/18/2017   Mental Status Per Nursing Assessment::   On Admission:  NA  Demographic Factors:  Male, Caucasian, Low socioeconomic status and Unemployed  Loss Factors: Decline in physical health, Legal issues and Financial problems/change in socioeconomic status  Historical Factors: Impulsivity  Risk Reduction Factors:   Positive coping skills or problem solving skills  Continued Clinical Symptoms:  Alcohol/Substance Abuse/Dependencies  Cognitive Features That Contribute To Risk:  None    Suicide Risk:  Minimal: No identifiable suicidal ideation.  Patients presenting with no risk factors but with morbid ruminations; may be classified as minimal risk based on the severity of the depressive symptoms  Follow-up Information    [facility]. Go on 10/04/2017.   Specialty:  Behavioral Health Why:  Please go for your intake with [facility] on Friday, 10/04/17 at 10AM. Thank you! Contact information: [address] [place] [id number] [phone]            Haskell RilingHolly R Jojuan Champney, MD 10/03/2017, 3:25 PM

## 2017-10-03 NOTE — Discharge Summary (Signed)
Physician Discharge Summary Note  Patient:  Rodney Watson is an 52 y.o., male MRN:  [id number] DOB:  1965/11/20 Patient phone:  [phone] (home)  [place]   [facility] [place] [id number],  Total Time spent with patient: 15 mins Plus 20 minutes of medication reconciliation, discharge planning, and discharge documentation   Date of Admission:  09/29/2017 Date of Discharge: 10/04/17  Reason for Admission:  Suicidal thoughts, alcohol use disorder  Principal Problem: Major depressive disorder, recurrent episode, severe (HCC) Discharge Diagnoses: Patient Active Problem List   Diagnosis Date Noted  . Major depressive disorder, recurrent episode, severe (HCC) [F33.2] 09/29/2017    Priority: High  . Alcohol abuse with alcohol-induced mood disorder (HCC) [F10.14] 04/10/2017    Priority: High  . Alcohol withdrawal (HCC) [F10.239] 07/18/2017  . Seizure disorder (HCC) [G40.909] 07/18/2017  . Essential hypertension [I10] 07/18/2017    Past Psychiatric History: See H&P  Past Medical History:  Past Medical History:  Diagnosis Date  . Alcohol abuse   . Alcohol abuse 04/01/2017  . COPD (chronic obstructive pulmonary disease) (HCC)   . Depression   . ETOH abuse   . Seizures (HCC)    History reviewed. No pertinent surgical history. Family History:  Family History  Problem Relation Age of Onset  . Alcohol abuse Father   . Seizures Neg Hx    Family Psychiatric  History: See h&P Social History:  Social History   Substance and Sexual Activity  Alcohol Use Yes   Comment: 5th daily     Social History   Substance and Sexual Activity  Drug Use Yes  . Types: Marijuana   Comment: 1-2 joints/week    Social History   Socioeconomic History  . Marital status: Single    Spouse name: Not on file  . Number of children: Not on file  . Years of education: Not on file  . Highest education level: Not on file  Occupational History  . Not on file  Social Needs  . Financial  resource strain: Not on file  . Food insecurity:    Worry: Not on file    Inability: Not on file  . Transportation needs:    Medical: Not on file    Non-medical: Not on file  Tobacco Use  . Smoking status: Current Every Day Smoker    Packs/day: 1.50    Years: 39.00    Pack years: 58.50    Types: Cigarettes  . Smokeless tobacco: Current User    Types: Chew  Substance and Sexual Activity  . Alcohol use: Yes    Comment: 5th daily  . Drug use: Yes    Types: Marijuana    Comment: 1-2 joints/week  . Sexual activity: Never  Lifestyle  . Physical activity:    Days per week: Not on file    Minutes per session: Not on file  . Stress: Not on file  Relationships  . Social connections:    Talks on phone: Not on file    Gets together: Not on file    Attends religious service: Not on file    Active member of club or organization: Not on file    Attends meetings of clubs or organizations: Not on file    Relationship status: Not on file  Other Topics Concern  . Not on file  Social History Narrative   ** Merged History Encounter [facility]**        Hospital Course:  Pt was restarted on Prozac and started on Seroquel for mood  stabilization. He completed detox protocol with [facility]. He was calm and pleasant on the unit. HE was interacting well with peers. HE was referred to [facility] and accepted into the program. On day of discharge, he had bright affect. He states that he is looking forward to getting sober and really wants to "start living my life." HE is very thankful for all the help he received here and apologized for any irritability he displayed. He was laughing and social with peers. HE reported sleeping much better. He denied SI or any thoughts of self harm. Denied AH, VH, HI.   The patient is at low risk of imminent suicide. Patient denied thoughts, intent, or plan for harm to self or others, expressed significant future orientation, and expressed an ability to mobilize assistance for his needs. he  is presently void of any contributing psychiatric symptoms, cognitive difficulties, or substance use which would elevate his risk for lethality. Chronic risk for lethality is elevated in light of substance abuse, homelessness. The chronic risk is presently mitigated by his [facility] and mobilization of support from family and friends. Chronic risk may elevate if he experiences any significant loss or worsening of symptoms, which can be managed and monitored through outpatient providers. At this time,a cute risk for lethality is low and he is stable for ongoing outpatient management.   Modifiable risk factors were addressed during this hospitalization through appropriate pharmacotherapy and establishment of outpatient follow-up treatment. Some risk factors for suicide are situational (i.e. Unstable housing) or related personality pathology (i.e. Poor coping mechanisms) and thus cannot be further mitigated by continued hospitalization in this setting.    Physical Findings: AIMS:  , ,  ,  ,    CIWA:  CIWA-Ar Total: 2 COWS:     Musculoskeletal: Strength & Muscle Tone: within normal limits Gait & Station: normal Patient leans: N/A  Psychiatric Specialty Exam: Physical Exam  Nursing note and vitals reviewed.   Review of Systems  All other systems reviewed and are negative.   Blood pressure 112/72, pulse 88, temperature 98.3 F (36.8 C), temperature source Oral, resp. rate 16, height 5\' 7"  (1.702 m), weight 64 kg, SpO2 100 %.Body mass index is 22.08 kg/m.  General Appearance: Casual  Eye Contact:  Good  Speech:  Clear and Coherent  Volume:  Normal  Mood:  Euthymic  Affect:  Appropriate  Thought Process:  Coherent and Goal Directed  Orientation:  Full (Time, Place, and Person)  Thought Content:  Logical  Suicidal Thoughts:  No  Homicidal Thoughts:  No  Memory:  Immediate;   Fair  Judgement:  Fair  Insight:  Fair  Psychomotor Activity:  Normal   Concentration:  Concentration: Fair  Recall:  [employer]Fair  Fund of Knowledge:  Fair  Language:  Fair  Akathisia:  No      Assets:  Communication Skills  ADL's:  Intact  Cognition:  WNL  Sleep:  Number of Hours: 6.75     Have you used any form of tobacco in the last 30 days? (Cigarettes, Smokeless Tobacco, Cigars, and/or Pipes): Yes  Has this patient used any form of tobacco in the last 30 days? (Cigarettes, Smokeless Tobacco, Cigars, and/or Pipes) Yes, Yes, A prescription for an FDA-approved tobacco cessation medication was offered at discharge and the patient refused  Blood Alcohol level:  Lab Results  Component Value Date   ETH 157 (H) 09/27/2017   ETH <10 09/26/2017    Metabolic Disorder Labs:  No  results found for: HGBA1C, MPG No results found for: PROLACTIN No results found for: CHOL, TRIG, HDL, CHOLHDL, VLDL, LDLCALC  See Psychiatric Specialty Exam and Suicide Risk Assessment completed by Attending Physician prior to discharge.  Discharge destination:  [facility]  Is patient on multiple antipsychotic therapies at discharge:  No   Has Patient had three or more failed trials of antipsychotic monotherapy by history:  No  Recommended Plan for Multiple Antipsychotic Therapies: NA  Discharge Instructions    Increase activity slowly   Complete by:  As directed      Allergies as of 10/03/2017      Reactions   Chocolate Hives   Vicodin [hydrocodone-acetaminophen] Nausea And Vomiting      Medication List    STOP taking these medications   traZODone 100 MG tablet Commonly known as:  DESYREL     TAKE these medications     Indication  FLUoxetine 20 MG capsule Commonly known as:  PROZAC Take 1 capsule (20 mg total) by mouth daily.  Indication:  Excessive Use of Alcohol   folic acid 1 MG tablet Commonly known as:  FOLVITE Take 1 tablet (1 mg total) by mouth daily.  Indication:  Anemia From Inadequate Folic Acid   gabapentin 300 MG capsule Commonly known as:   NEURONTIN Take 1 capsule (300 mg total) by mouth 3 (three) times daily.  Indication:  Abuse or Misuse of Alcohol, Alcohol Withdrawal Syndrome, Agitation   levothyroxine 25 MCG tablet Commonly known as:  SYNTHROID, LEVOTHROID Take 1 tablet (25 mcg total) by mouth daily before breakfast. Start taking on:  10/04/2017  Indication:  Underactive Thyroid   lisinopril 10 MG tablet Commonly known as:  PRINIVIL,ZESTRIL Take 1 tablet (10 mg total) by mouth daily. For high blood pressure  Indication:  High Blood Pressure Disorder   phenytoin 100 MG ER capsule Commonly known as:  DILANTIN Take 1 capsule (100 mg total) by mouth 3 (three) times daily.  Indication:  Seizure   QUEtiapine 50 MG tablet Commonly known as:  SEROQUEL Take 3 tablets (150 mg total) by mouth at bedtime.  Indication:  Major Depressive Disorder   thiamine 100 MG tablet Take 1 tablet (100 mg total) by mouth daily.  Indication:  Deficiency of Vitamin B1      Follow-up Information    [facility] Go on [facility]2017.   Specialty:  Behavioral Health Why:  Please go for your intake with [facility] on Friday, 10/04/17 at 10AM. Thank you! Contact information: [address] [place] [address] [phone]          Signed: Haskell Riling, MD 10/03/2017, 3:26 PM

## 2017-10-03 NOTE — BHH Group Notes (Signed)
LCSW Group Therapy Note 10/03/2017 9:00 AM  Type of Therapy and Topic:  Group Therapy:  Setting Goals  Participation Level:  Did Not Attend  Description of Group: In this process group, patients discussed using strengths to work toward goals and address challenges.  Patients identified two positive things about themselves and one goal they were working on.  Patients were given the opportunity to share openly and support each other's plan for self-empowerment.  The group discussed the value of gratitude and were encouraged to have a daily reflection of positive characteristics or circumstances.  Patients were encouraged to identify a plan to utilize their strengths to work on current challenges and goals.  Therapeutic Goals 1. Patient will verbalize personal strengths/positive qualities and relate how these can assist with achieving desired personal goals 2. Patients will verbalize affirmation of peers plans for personal change and goal setting 3. Patients will explore the value of gratitude and positive focus as related to successful achievement of goals 4. Patients will verbalize a plan for regular reinforcement of personal positive qualities and circumstances.  Summary of Patient Progress:  Rodney Watson was invited to today's group, but chose not to attend.     Therapeutic Modalities Cognitive Behavioral Therapy Motivational Interviewing    Rodney Watson, [place] 10/03/2017 2:04 PM

## 2017-10-03 NOTE — Progress Notes (Signed)
Recreation Therapy Notes  Date: 10/03/2017  Time: 9:30 am   Location: Craft Room   Behavioral response: N/A   Intervention Topic: Coping Skills  Discussion/Intervention: Patient did not attend group.   Clinical Observations/Feedback:  Patient did not attend group.   Shayon Trompeter LRT/CTRS        Jomarie Gellis 10/03/2017 10:47 AM

## 2017-10-03 NOTE — Progress Notes (Addendum)
[facility]  10/03/2017 3:16 PM Rodney AceWilliam Moltz  MRN:  [id number] Subjective:  Pt states that he is doing well. His mood is better. He denies SI or thoughts of self harm. He laughs and states, "I'll stay away from freight trains." He has a sense of humor. He is interacting and socializing with peers. He is sleeping well with Seroquel. HE states that he is mad because he "was made to take trazodone when I didn't want it." Per notes, this was not the case that he was angry about the Seroquel dose and was not "made" to take the trazodone. I told him that we would leave the Seroquel at 150 mg and d/c the trazodone which he was in agreement with. He is excited to go to [facility] but he adamantly refuses to go with the sheriff. He is organized and goal directed. Denies AH, VH, SI.  Principal Problem: Major depressive disorder, recurrent episode, severe (HCC) Diagnosis:   Patient Active Problem List   Diagnosis Date Noted  . Major depressive disorder, recurrent episode, severe (HCC) [F33.2] 09/29/2017    Priority: High  . Alcohol abuse with alcohol-induced mood disorder (HCC) [F10.14] 04/10/2017    Priority: High  . Alcohol withdrawal (HCC) [F10.239] 07/18/2017  . Seizure disorder (HCC) [G40.909] 07/18/2017  . Essential hypertension [I10] 07/18/2017   Total Time spent with patient: 20 minutes  Past Psychiatric History: See H&P  Past Medical History:  Past Medical History:  Diagnosis Date  . Alcohol abuse   . Alcohol abuse 04/01/2017  . COPD (chronic obstructive pulmonary disease) (HCC)   . Depression   . ETOH abuse   . Seizures (HCC)    History reviewed. No pertinent surgical history. Family History:  Family History  Problem Relation Age of Onset  . Alcohol abuse Father   . Seizures Neg Hx    Family Psychiatric  History: See H&P Social History:  Social History   Substance and Sexual Activity  Alcohol Use Yes   Comment: 5th daily     Social History   Substance and Sexual  Activity  Drug Use Yes  . Types: Marijuana   Comment: 1-2 joints/week    Social History   Socioeconomic History  . Marital status: Single    Spouse name: Not on file  . Number of children: Not on file  . Years of education: Not on file  . Highest education level: Not on file  Occupational History  . Not on file  Social Needs  . Financial resource strain: Not on file  . Food insecurity:    Worry: Not on file    Inability: Not on file  . Transportation needs:    Medical: Not on file    Non-medical: Not on file  Tobacco Use  . Smoking status: Current Every Day Smoker    Packs/day: 1.50    Years: 39.00    Pack years: 58.50    Types: Cigarettes  . Smokeless tobacco: Current User    Types: Chew  Substance and Sexual Activity  . Alcohol use: Yes    Comment: 5th daily  . Drug use: Yes    Types: Marijuana    Comment: 1-2 joints/week  . Sexual activity: Never  Lifestyle  . Physical activity:    Days per week: Not on file    Minutes per session: Not on file  . Stress: Not on file  Relationships  . Social connections:    Talks on phone: Not on file  Gets together: Not on file    Attends religious service: Not on file    Active member of club or organization: Not on file    Attends meetings of clubs or organizations: Not on file    Relationship status: Not on file  Other Topics Concern  . Not on file  Social History Narrative   ** Merged History Encounter **       Additional Social History:                         Sleep: Good  Appetite:  Good  Current Medications: Current Facility-Administered Medications  Medication Dose Route Frequency Provider Last Rate Last Dose  . alum & mag hydroxide-simeth (MAALOX/MYLANTA) 200-200-20 MG/5ML suspension 30 mL  30 mL Oral Q4H PRN Darliss Ridgel, MD      . chlordiazePOXIDE (LIBRIUM) capsule 50 mg  50 mg Oral Q6H PRN Laronn Devonshire, Ileene Hutchinson, MD   50 mg at 09/30/17 1325  . feeding supplement (ENSURE ENLIVE) (ENSURE ENLIVE)  liquid 237 mL  237 mL Oral TID BM Tavie Haseman, Ileene Hutchinson, MD   237 mL at 10/03/17 1433  . FLUoxetine (PROZAC) capsule 20 mg  20 mg Oral Daily Darliss Ridgel, MD   20 mg at 10/03/17 0803  . folic acid (FOLVITE) tablet 1 mg  1 mg Oral Daily Darliss Ridgel, MD   1 mg at 10/03/17 0803  . gabapentin (NEURONTIN) capsule 600 mg  600 mg Oral TID Haskell Riling, MD   600 mg at 10/03/17 1208  . ibuprofen (ADVIL,MOTRIN) tablet 600 mg  600 mg Oral Q6H PRN Haskell Riling, MD   600 mg at 10/03/17 1432  . levothyroxine (SYNTHROID, LEVOTHROID) tablet 25 mcg  25 mcg Oral QAC breakfast Haskell Riling, MD   25 mcg at 10/03/17 [phone]  . lisinopril (PRINIVIL,ZESTRIL) tablet 10 mg  10 mg Oral Daily Darliss Ridgel, MD   10 mg at 10/03/17 0803  . magnesium hydroxide (MILK OF MAGNESIA) suspension 30 mL  30 mL Oral Daily PRN Darliss Ridgel, MD      . multivitamin with minerals tablet 1 tablet  1 tablet Oral Daily Darliss Ridgel, MD   1 tablet at 10/03/17 0803  . nicotine (NICODERM CQ - dosed in mg/24 hours) patch 21 mg  21 mg Transdermal Daily Haedyn Ancrum R, MD      . nicotine polacrilex (NICORETTE) gum 2 mg  2 mg Oral PRN Haskell Riling, MD   2 mg at 10/03/17 1433  . phenytoin (DILANTIN) ER capsule 100 mg  100 mg Oral TID Darliss Ridgel, MD   100 mg at 10/03/17 1208  . QUEtiapine (SEROQUEL) tablet 150 mg  150 mg Oral QHS Linsie Lupo, Ileene Hutchinson, MD   150 mg at 10/02/17 2134  . thiamine (B-1) injection 100 mg  100 mg Intramuscular Once Darliss Ridgel, MD      . thiamine (VITAMIN B-1) tablet 100 mg  100 mg Oral Daily Darliss Ridgel, MD   100 mg at 10/03/17 0803  . traZODone (DESYREL) tablet 100 mg  100 mg Oral QHS PRN Haskell Riling, MD   100 mg at 10/02/17 2135    Lab Results: No results found for this or any previous visit (from the past 48 hour(s)).  Blood Alcohol level:  Lab Results  Component Value Date   ETH 157 (H) 09/27/2017   ETH <10 09/26/2017    Metabolic  Disorder Labs: No results found for: HGBA1C, MPG No results found  for: PROLACTIN No results found for: CHOL, TRIG, HDL, CHOLHDL, VLDL, LDLCALC  Physical Findings: AIMS:  , ,  ,  ,    CIWA:  CIWA-Ar Total: 2 COWS:     Musculoskeletal: Strength & Muscle Tone: within normal limits Gait & Station: normal Patient leans: N/A  Psychiatric Specialty Exam: Physical Exam  Nursing note and vitals reviewed.   Review of Systems  All other systems reviewed and are negative.   Blood pressure 112/72, pulse 88, temperature 98.3 F (36.8 C), temperature source Oral, resp. rate 16, height 5\' 7"  (1.702 m), weight 64 kg, SpO2 100 %.Body mass index is 22.08 kg/m.  General Appearance: Casual  Eye Contact:  Good  Speech:  Clear and Coherent  Volume:  Normal  Mood:  Euthymic  Affect:  Appropriate  Thought Process:  Coherent and Goal Directed  Orientation:  Full (Time, Place, and Person)  Thought Content:  Logical  Suicidal Thoughts:  No  Homicidal Thoughts:  No  Memory:  Immediate;   Poor  Judgement:  Impaired  Insight:  Lacking  Psychomotor Activity:  Normal  Concentration:  Concentration: Fair  Recall:  [employer]Fair  Fund of Knowledge:  Fair  Language:  Fair  Akathisia:  No      Assets:  Resilience  ADL's:  Intact  Cognition:  WNL  Sleep:  Number of Hours: 6.75     Treatment Plan Summary: 52 yo male admitted due to Si and heavy alcohol use. SI has resolved. He is excited to go to [facility] tomorrow.   Plan:  Substance induced Mood disorder -Continue Prozac 20 mg daily -Continue Seroquel 150 mg qhs. He is sleeping well with this dose -Will d/c prn trazodone per his request  History of epilepsy -Dilantin level normal. Will continue 100 mg TID  Alcohol use disorder and withdrawal -Vitals normal and CIWA scoring low consistently -will d.c CIWA and Librium  HTN -Continue Lisinopril 10 mg daily  Hypothyroid -Continue Synthroid 25 mcg  Dispo _[facility] in the morning   Haskell RilingHolly R Alayza Pieper, MD 10/03/2017, 3:16 PM

## 2017-10-03 NOTE — Progress Notes (Addendum)
Recreation Therapy Notes  Date: 10/03/2017  Time: 3:00pm  Location: Craft room  Behavioral response: Appropriate  Group Type: Game  Participation level: Active  Communication: Patient was social with peers and staff while participating in group.   Comments: N/A  Rodney Watson LRT/CTRS      [name] 10/03/2017 3:32 PM

## 2017-10-03 NOTE — [facility] Group Notes (Signed)
[facility] Group Notes:  (Nursing/MHT/Case Management/Adjunct)  Date:  10/03/2017  Time:  10:00 PM  Type of Therapy:  Group Therapy  Participation Level:  Active  Participation Quality:  Appropriate  Affect:  Appropriate  Cognitive:  Appropriate  Insight:  Appropriate  Engagement in Group:  Engaged  Modes of Intervention:  Discussion  Summary of Progress/Problems: Rodney Watson participated in group. Rodney Watson initially did not want to participate in group. Rodney Watson eventually shared with group. Rodney Watson stated his goal was not to cuss anybody out on this day. Rodney Watson stated he was able to accomplish he goal by keeping his mouth shut. Rodney Watson stated he had a pretty good day. Rodney Watson stated he was excited about going to [facility] on the next day. Rodney Watson stated he was not going due to transportation issues. Rodney Watson stated the Child psychotherapistsocial worker worked out getting [facility] a [facility] instead of a Emergency planning/management officerpolice officer. MHT explained rules and expectations of the unit. MHT processed with patients about preparing for discharge. MHT processed with patients about medication compliance. MHT informed patients of outpatient treatment. MHT explained the benefits of outpatient treatment combined with medication compliance. MHT encouraged patients to follow up with treatment recommendations at discharge. Jinger NeighborsKeith D Sharelle Burditt 10/03/2017, 10:00 PM

## 2017-10-03 NOTE — [facility] Group Notes (Signed)
LCSW Group Therapy Note   10/03/2017  Time: 1PM  Type of Therapy/Topic:  Group Therapy:  Balance in Life  Participation Level:  Active  Description of Group:   This group will address the concept of balance and how it feels and looks when one is unbalanced. Patients will be encouraged to process areas in their lives that are out of balance and identify reasons for remaining unbalanced. Facilitators will guide patients in utilizing problem-solving interventions to address and correct the stressor making their life unbalanced. Understanding and applying boundaries will be explored and addressed for obtaining and maintaining a balanced life. Patients will be encouraged to explore ways to assertively make their unbalanced needs known to significant others in their lives, using other group members and facilitator for support and feedback.  Therapeutic Goals: 1. Patient will identify two or more emotions or situations they have that consume much of in their lives. 2. Patient will identify signs/triggers that life has become out of balance:  3. Patient will identify two ways to set boundaries in order to achieve balance in their lives:  4. Patient will demonstrate ability to communicate their needs through discussion and/or role plays  Summary of Patient Progress: Pt continues to work towards their tx goals but has not yet reached them. Pt was able to appropriately participate in group discussion, and was able to offer support/validation to other group members. Pt reported feeling excited about leaving tomorrow to attend substance use treatment. Pt reported one way he can have a better balance in life is by, "going to [facility] tomorrow. I really don't want to drink anymore. I want to change my life."   Therapeutic Modalities:   Cognitive Behavioral Therapy Solution-Focused Therapy Assertiveness Training  Heidi DachKelsey Ilina Xu, MSW, LCSW Clinical Social Worker 10/03/2017 2:01 PM

## 2017-10-03 NOTE — Plan of Care (Signed)
Patient denies SI,HI and AVH.Excited about going to [facility] tomorrow.Appropriate in the unit.Attended groups.Appetite and energy level good.Support and encouragement given.

## 2017-10-04 NOTE — Progress Notes (Signed)
Patient was provided with discharge summary, Transition packet, and Suicide Risk Assessment. Verbalized discharge summary, transition packet and suicide risk assessment, patient made aware of any change in medications, and all upcoming appointments.   Patient belongings/money returned.  Patient denied any SI, denies plans for self harm or the harm of others. Patient is calm, with appropriate affect and eye contact. Patient reports no complaints at this time.   Patient will be discharged to taxi service at [phone]

## 2017-10-04 NOTE — Progress Notes (Signed)
  [facility] :  Will you be returning to the same living situation after discharge:  No. At discharge, do you have transportation home?: Yes,  taxi Do you have the ability to pay for your medications: Yes,  [facility]  Release of information consent forms completed and in the chart;  Patient's signature needed at discharge.  Patient to Follow up at: Follow-up Information    [facility]. Go on 10/04/2017.   Specialty:  Behavioral Health Why:  Please go for your intake with [facility] on Friday, 10/04/17 at 10AM. Thank you! Contact information: [address] [place] [phone]          Next level of care provider [facility] Link:no  Safety Planning and Suicide Prevention discussed: Yes,  with pt. Pt declined to involve additional supports in his tx.  Have you used any form of tobacco in the last 30 days? (Cigarettes, Smokeless Tobacco, Cigars, and/or Pipes): Yes  Has patient been referred to the Quitline?: Patient refused referral  Patient has been referred for addiction treatment: Yes  Heidi DachKelsey Goran Olden, LCSW 10/04/2017, 9:11 AM

## 2017-10-04 NOTE — Plan of Care (Signed)
  Problem: Coping: Goal: Ability to identify and develop effective coping behavior will improve Outcome: Progressing  Patient is coping effectively  

## 2017-10-04 NOTE — Progress Notes (Signed)
Patient alert and oriented x 4, no distress noted, his affect is flat but brightens upon approach. Patient denies SI/HI/AVH , he appears less anxious, interacting with peers ans staff appropriately no loud outburst or argumentative episode with Clinical research associatewriter. Patient is complaint with medication regimen, he stated " l am happy going to [facility]" residential treatment, 15 minutes safety checks maintained will continue to monitor.

## 2017-10-23 ENCOUNTER — Emergency Department
Admission: EM | Admit: 2017-10-23 | Discharge: 2017-10-24 | Disposition: A | Discharge status: Other Acute Inpatient Hospital | Payer: Medicaid Other | Attending: Emergency Medicine | Admitting: Emergency Medicine

## 2017-10-23 ENCOUNTER — Encounter: Payer: Self-pay | Admitting: Emergency Medicine

## 2017-10-23 ENCOUNTER — Other Ambulatory Visit: Payer: Self-pay

## 2017-10-23 DIAGNOSIS — Z79899 Other long term (current) drug therapy: Secondary | ICD-10-CM | POA: Diagnosis not present

## 2017-10-23 DIAGNOSIS — T1491XA Suicide attempt, initial encounter: Secondary | ICD-10-CM | POA: Diagnosis not present

## 2017-10-23 DIAGNOSIS — Y9389 Activity, other specified: Secondary | ICD-10-CM | POA: Insufficient documentation

## 2017-10-23 DIAGNOSIS — F332 Major depressive disorder, recurrent severe without psychotic features: Secondary | ICD-10-CM | POA: Diagnosis present

## 2017-10-23 DIAGNOSIS — F10239 Alcohol dependence with withdrawal, unspecified: Secondary | ICD-10-CM | POA: Diagnosis present

## 2017-10-23 DIAGNOSIS — X838XXA Intentional self-harm by other specified means, initial encounter: Secondary | ICD-10-CM | POA: Insufficient documentation

## 2017-10-23 DIAGNOSIS — J449 Chronic obstructive pulmonary disease, unspecified: Secondary | ICD-10-CM | POA: Insufficient documentation

## 2017-10-23 DIAGNOSIS — T420X2A Poisoning by hydantoin derivatives, intentional self-harm, initial encounter: Secondary | ICD-10-CM

## 2017-10-23 DIAGNOSIS — I10 Essential (primary) hypertension: Secondary | ICD-10-CM | POA: Diagnosis not present

## 2017-10-23 DIAGNOSIS — G40909 Epilepsy, unspecified, not intractable, without status epilepticus: Secondary | ICD-10-CM

## 2017-10-23 DIAGNOSIS — F329 Major depressive disorder, single episode, unspecified: Secondary | ICD-10-CM | POA: Diagnosis not present

## 2017-10-23 DIAGNOSIS — F1014 Alcohol abuse with alcohol-induced mood disorder: Secondary | ICD-10-CM | POA: Diagnosis present

## 2017-10-23 DIAGNOSIS — T420X1A Poisoning by hydantoin derivatives, accidental (unintentional), initial encounter: Secondary | ICD-10-CM | POA: Insufficient documentation

## 2017-10-23 DIAGNOSIS — F10129 Alcohol abuse with intoxication, unspecified: Secondary | ICD-10-CM | POA: Diagnosis not present

## 2017-10-23 DIAGNOSIS — Y929 Unspecified place or not applicable: Secondary | ICD-10-CM | POA: Insufficient documentation

## 2017-10-23 DIAGNOSIS — F17210 Nicotine dependence, cigarettes, uncomplicated: Secondary | ICD-10-CM | POA: Insufficient documentation

## 2017-10-23 DIAGNOSIS — F10920 Alcohol use, unspecified with intoxication, uncomplicated: Secondary | ICD-10-CM

## 2017-10-23 DIAGNOSIS — Y998 Other external cause status: Secondary | ICD-10-CM | POA: Insufficient documentation

## 2017-10-23 DIAGNOSIS — F10939 Alcohol use, unspecified with withdrawal, unspecified: Secondary | ICD-10-CM | POA: Diagnosis present

## 2017-10-23 DIAGNOSIS — R45851 Suicidal ideations: Secondary | ICD-10-CM

## 2017-10-23 HISTORY — DX: Cocaine use, unspecified, uncomplicated: F14.90

## 2017-10-23 LAB — COMPREHENSIVE METABOLIC PANEL
ALBUMIN: 4.3 g/dL (ref 3.5–5.0)
ALT: 36 U/L (ref 0–44)
ANION GAP: 12 (ref 5–15)
AST: 38 U/L (ref 15–41)
Alkaline Phosphatase: 81 U/L (ref 38–126)
BUN: 12 mg/dL (ref 6–20)
CO2: 22 mmol/L (ref 22–32)
Calcium: 8.5 mg/dL — ABNORMAL LOW (ref 8.9–10.3)
Chloride: 106 mmol/L (ref 98–111)
Creatinine, Ser: 0.7 mg/dL (ref 0.61–1.24)
GFR calc non Af Amer: >60 mL/min (ref >60)
GLUCOSE: 97 mg/dL (ref 70–99)
POTASSIUM: 3.5 mmol/L (ref 3.5–5.1)
Sodium: 140 mmol/L (ref 135–145)
TOTAL PROTEIN: 7.3 g/dL (ref 6.5–8.1)
Total Bilirubin: 1 mg/dL (ref 0.3–1.2)

## 2017-10-23 LAB — URINALYSIS, COMPLETE (UACMP) WITH MICROSCOPIC
Bacteria, UA: NONE SEEN
Bilirubin Urine: NEGATIVE
GLUCOSE, UA: NEGATIVE mg/dL
Ketones, ur: NEGATIVE mg/dL
Leukocytes, UA: NEGATIVE
NITRITE: NEGATIVE
PH: 5 (ref 5.0–8.0)
PROTEIN: NEGATIVE mg/dL
Specific Gravity, Urine: 1.024 (ref 1.005–1.030)

## 2017-10-23 LAB — URINE DRUG SCREEN, QUALITATIVE (ARMC ONLY)
AMPHETAMINES, UR SCREEN: NOT DETECTED
Barbiturates, Ur Screen: NOT DETECTED
CANNABINOID 50 NG, UR ~~LOC~~: NOT DETECTED
Cocaine Metabolite,Ur ~~LOC~~: POSITIVE — AB
MDMA (ECSTASY) UR SCREEN: NOT DETECTED
Methadone Scn, Ur: NOT DETECTED
Opiate, Ur Screen: NOT DETECTED
PHENCYCLIDINE (PCP) UR S: NOT DETECTED
Tricyclic, Ur Screen: NOT DETECTED

## 2017-10-23 LAB — CBC WITH DIFFERENTIAL/PLATELET
BASOS PCT: 1 %
Basophils Absolute: 0.1 10*3/uL (ref 0–0.1)
EOS PCT: 1 %
Eosinophils Absolute: 0.1 10*3/uL (ref 0–0.7)
HCT: 44.3 % (ref 40.0–52.0)
Hemoglobin: 15.4 g/dL (ref 13.0–18.0)
Lymphocytes Relative: 37 %
Lymphs Abs: 3.6 10*3/uL (ref 1.0–3.6)
MCH: 31.7 pg (ref 26.0–34.0)
MCHC: 34.8 g/dL (ref 32.0–36.0)
MCV: 91.3 fL (ref 80.0–100.0)
MONOS PCT: 10 %
Monocytes Absolute: 1 10*3/uL (ref 0.2–1.0)
NEUTROS PCT: 51 %
Neutro Abs: 4.8 10*3/uL (ref 1.4–6.5)
Platelets: 228 10*3/uL (ref 150–440)
RBC: 4.86 MIL/uL (ref 4.40–5.90)
RDW: 15 % — AB (ref 11.5–14.5)
WBC: 9.6 10*3/uL (ref 3.8–10.6)

## 2017-10-23 LAB — SALICYLATE LEVEL

## 2017-10-23 LAB — ETHANOL: ALCOHOL ETHYL (B): 259 mg/dL — AB (ref <10)

## 2017-10-23 LAB — PHENYTOIN LEVEL, TOTAL: PHENYTOIN LVL: 16.8 ug/mL (ref 10.0–20.0)

## 2017-10-23 LAB — ACETAMINOPHEN LEVEL: Acetaminophen (Tylenol), Serum: <10 ug/mL — ABNORMAL LOW (ref 10–30)

## 2017-10-23 MED ORDER — LORAZEPAM 2 MG/ML IJ SOLN: 0.0000 mg | Freq: Two times a day (BID) | INTRAMUSCULAR | Status: DC

## 2017-10-23 MED ORDER — VITAMIN B-1 100 MG PO TABS
100.0000 mg | ORAL_TABLET | Freq: Every day | ORAL | Status: DC
  Administered 2017-10-23: 100 mg via ORAL
  Filled 2017-10-23: qty 1

## 2017-10-23 MED ORDER — LORAZEPAM 2 MG PO TABS
0.0000 mg | ORAL_TABLET | Freq: Four times a day (QID) | ORAL | Status: DC
  Administered 2017-10-23: 2 mg via ORAL
  Filled 2017-10-23: qty 1

## 2017-10-23 MED ORDER — SODIUM CHLORIDE 0.9 % IV BOLUS
1000.0000 mL | Freq: Once | INTRAVENOUS | Status: AC
  Administered 2017-10-23: 1000 mL via INTRAVENOUS

## 2017-10-23 MED ORDER — THIAMINE HCL 100 MG/ML IJ SOLN: 100.0000 mg | Freq: Every day | INTRAMUSCULAR | Status: DC

## 2017-10-23 MED ORDER — LORAZEPAM 2 MG PO TABS: 0.0000 mg | ORAL_TABLET | Freq: Two times a day (BID) | ORAL | Status: DC

## 2017-10-23 MED ORDER — LORAZEPAM 2 MG/ML IJ SOLN: 0.0000 mg | Freq: Four times a day (QID) | INTRAMUSCULAR | Status: DC

## 2017-10-23 NOTE — ED Notes (Signed)
Arrived for a 1:1 with pt.  Resting quietly.

## 2017-10-23 NOTE — ED Notes (Signed)
Patient is resting comfortably at this time with no signs of distress present. Equal, unlabored rise and fall of chest noted within normal rate. VS stable. Will continue to monitor. No concerns for acute safety risks at this time.

## 2017-10-23 NOTE — ED Notes (Signed)
Telesitter, Tammy, states that per her supervisor, no authorization of IVC patient monitoring by telesitter approved by [employer], even with officer presence at room for IVC status.   1:1 trained sitter will remain at bedside.

## 2017-10-23 NOTE — ED Notes (Signed)
Verbal contract for safety with this RN. Pt agrees to not harm himself, or anyone else, and also agrees to inform staff of any active thoughts to do so as soon as they occur.

## 2017-10-23 NOTE — ED Notes (Signed)
Pt resting quietly. VSS. Security sitting outside room.

## 2017-10-23 NOTE — ED Notes (Signed)
During triage, pt states "I have a plan. I am going to grab my clothes up, walk out of here and down the road to the train tracks. Then I am going to walk into the train when it's coming toward me."   Pt denies specific event to cause acute SI/SA. Reports no family. Homeless.

## 2017-10-23 NOTE — ED Triage Notes (Signed)
Pt bib [facility] from behind a warehouse in [place]. Pt states he is homeless and depressed and wanted to end his life-he drank 3 pints of mouthwash, took 15 100 mg dilantin, and smoked crack cocaine approx 1430pm. Hx epilepsy, A&O x3. md provider at bedside upon arrival and IVC explained to pt.

## 2017-10-23 NOTE — ED Provider Notes (Signed)
-----------------------------------------   11:36 PM on 10/23/2017 -----------------------------------------  Signed out to dr. Lamont Snowball at the end of my shift. Pending repeat phenytoin   Jeanmarie Plant, MD 10/23/17 2337

## 2017-10-23 NOTE — ED Notes (Addendum)
1:1 Suicide sitting taken over by this EDT. Patient is currenly sleeping at this time. Will continue to monitor via Q15 minute saftey sheet.

## 2017-10-23 NOTE — ED Provider Notes (Signed)
[facility] Emergency Department Provider Note ____________________________________________   First MD Initiated Contact with Patient 10/23/17 1737     (approximate)  I have reviewed the triage vital signs and the nursing notes.   HISTORY  Chief Complaint Drug Overdose and Suicide Attempt  HPI Rodney Watson is a 52 y.o. male with a history of alcohol abuse, depression and seizures who was presented to the emergency department today after an intentional overdose.  The patient says that he took 15, 100 mg tabs of Dilantin ER and he says that he also drank 3 pints of mouthwash and smokes crack cocaine earlier today.  He says that he did this in an attempt to kill himself because he is "tired of life."  IVC paperwork was completed prior to arrival by police.  Past Medical History:  Diagnosis Date  . Alcohol abuse   . Alcohol abuse 04/01/2017  . COPD (chronic obstructive pulmonary disease) (HCC)   . Crack cocaine use   . Depression   . ETOH abuse   . Seizures [facility])     Patient Active Problem List   Diagnosis Date Noted  . Major depressive disorder, recurrent episode, severe (HCC) 09/29/2017  . Alcohol withdrawal (HCC) 07/18/2017  . Seizure disorder (HCC) 07/18/2017  . Essential hypertension 07/18/2017  . Alcohol abuse with alcohol-induced mood disorder (HCC) 04/10/2017    Past Surgical History:  Procedure Laterality Date  . BACK SURGERY      Prior to Admission medications   Medication Sig Start Date End Date Taking? Authorizing Provider  FLUoxetine (PROZAC) 20 MG capsule Take 1 capsule (20 mg total) by mouth daily. 07/19/17   Calvert Cantor, MD  folic acid (FOLVITE) 1 MG tablet Take 1 tablet (1 mg total) by mouth daily. 09/24/17   Shaune Pollack, MD  gabapentin (NEURONTIN) 300 MG capsule Take 1 capsule (300 mg total) by mouth 3 (three) times daily. 07/19/17   Calvert Cantor, MD  levothyroxine (SYNTHROID, LEVOTHROID) 25 MCG tablet Take 1 tablet (25 mcg total)  by mouth daily before breakfast. 10/04/17   McNew, Ileene Hutchinson, MD  lisinopril (PRINIVIL,ZESTRIL) 10 MG tablet Take 1 tablet (10 mg total) by mouth daily. For high blood pressure 07/19/17   Calvert Cantor, MD  phenytoin (DILANTIN) 100 MG ER capsule Take 1 capsule (100 mg total) by mouth 3 (three) times daily. 09/24/17   Shaune Pollack, MD  QUEtiapine (SEROQUEL) 50 MG tablet Take 3 tablets (150 mg total) by mouth at bedtime. 10/03/17   McNew, Ileene Hutchinson, MD  thiamine 100 MG tablet Take 1 tablet (100 mg total) by mouth daily. 09/24/17   Shaune Pollack, MD    Allergies Chocolate and Vicodin [hydrocodone-acetaminophen]  Family History  Problem Relation Age of Onset  . Alcohol abuse Father   . Seizures Neg Hx     Social History Social History   Tobacco Use  . Smoking status: Current Every Day Smoker    Packs/day: 1.50    Years: 39.00    Pack years: 58.50    Types: Cigarettes  . Smokeless tobacco: Current User    Types: Chew  Substance Use Topics  . Alcohol use: Yes    Comment: 5th daily  . Drug use: Yes    Types: Marijuana, Cocaine    Comment: 1-2 joints/week    Review of Systems  Constitutional: No fever/chills Eyes: No visual changes. ENT: No sore throat. Cardiovascular: Denies chest pain. Respiratory: Denies shortness of breath. Gastrointestinal: No abdominal pain.  No diarrhea.  No constipation. Genitourinary: Negative for dysuria. Musculoskeletal: Negative for back pain. Skin: Negative for rash. Neurological: Negative for headaches, focal weakness or numbness.   ____________________________________________   PHYSICAL EXAM:  VITAL SIGNS: ED Triage Vitals  Enc Vitals Group     BP 10/23/17 1744 (!) 149/78     Pulse Rate 10/23/17 1744 (!) 106     Resp 10/23/17 1744 18     Temp 10/23/17 1744 99 F (37.2 C)     Temp Source 10/23/17 1744 Oral     SpO2 10/23/17 1739 97 %     Weight 10/23/17 1756 140 lb (63.5 kg)     Height 10/23/17 1756 5\' 8"  (1.727 m)     Head Circumference --       Peak Flow --      Pain Score 10/23/17 1755 8     Pain Loc --      Pain Edu? --      Excl. in GC? --     Constitutional: Alert and oriented.  Disheveled. Eyes: Conjunctivae are normal.  Head: Atraumatic. Nose: No congestion/rhinnorhea. Mouth/Throat: Mucous membranes are moist.  Neck: No stridor.   Cardiovascular: Normal rate, regular rhythm. Grossly normal heart sounds Respiratory: Normal respiratory effort.  No retractions. Lungs CTAB. Gastrointestinal: Soft and nontender. No distention.  Musculoskeletal: No lower extremity tenderness nor edema.  No joint effusions. Neurologic:  Normal speech and language. No gross focal neurologic deficits are appreciated. Skin:  Skin is warm, dry and intact. No rash noted. Psychiatric: Mood and affect are normal. Speech and behavior are normal.  ____________________________________________   LABS (all labs ordered are listed, but only abnormal results are displayed)  Labs Reviewed  CBC WITH DIFFERENTIAL/PLATELET - Abnormal; Notable for the following components:      Result Value   RDW 15.0 (*)    All other components within normal limits  COMPREHENSIVE METABOLIC PANEL - Abnormal; Notable for the following components:   Calcium 8.5 (*)    All other components within normal limits  ACETAMINOPHEN LEVEL - Abnormal; Notable for the following components:   Acetaminophen (Tylenol), Serum <10 (*)    All other components within normal limits  ETHANOL - Abnormal; Notable for the following components:   Alcohol, Ethyl (B) 259 (*)    All other components within normal limits  SALICYLATE LEVEL  PHENYTOIN LEVEL, TOTAL  URINALYSIS, COMPLETE (UACMP) WITH MICROSCOPIC  URINE DRUG SCREEN, QUALITATIVE ([facility] ONLY)  PHENYTOIN LEVEL, TOTAL  PHENYTOIN LEVEL, TOTAL  PHENYTOIN LEVEL, TOTAL   ____________________________________________  EKG  ED ECG REPORT I, Arelia Longest, the attending physician, personally viewed and interpreted this ECG.    Date: 10/23/2017  EKG Time: 1754  Rate: 110  Rhythm: sinus tachycardia  Axis: Normal  Intervals:none  ST&T Change: No ST segment elevation or depression.  No abnormal T wave inversion.  ____________________________________________  RADIOLOGY   ____________________________________________   PROCEDURES  Procedure(s) performed:   Procedures  Critical Care performed:   ____________________________________________   INITIAL IMPRESSION / ASSESSMENT AND PLAN / ED COURSE  Pertinent labs & imaging results that were available during my care of the patient were reviewed by me and considered in my medical decision making (see chart for details).  DDX: Suicide attempt, intentional overdose, cardiac arrhythmia, electro cardiogram interval abnormality, alcohol intoxication, polysubstance abuse, depression As part of my medical decision making, I reviewed the following data within the electronic MEDICAL RECORD NUMBER Notes from prior ED visits  ----------------------------------------- 6:57 PM on 10/23/2017 -----------------------------------------  Discussed  the case with Angelique Blonder from poison control.  She recommends checking a Dilantin level every 4 hours for the next 12 hours or until the Dilantin level peaks and then starts to decrease.  At this time the patient is requesting food and is calm and cooperative.  Psychiatry consultation is pending.  ----------------------------------------- 8:23 PM on 10/23/2017 -----------------------------------------  Patient is still awake and alert.  Ordered second liter of fluid.  Dilantin is therapeutic.  We will continue to trend his Dilantin levels.  [place] protocol ordered.  Plan on moving patient to a monitored bed in the quad when one is available.  Signed out to Dr. Alphonzo Lemmings. ____________________________________________   FINAL CLINICAL IMPRESSION(S) / ED DIAGNOSES  Suicide attempt.  Dilantin overdose.  Polysubstance abuse.  Alcohol  intoxication.  NEW MEDICATIONS STARTED DURING THIS VISIT:  New Prescriptions   No medications on file     Note:  This document was prepared using Dragon voice recognition software and may include unintentional dictation errors.     Myrna Blazer, MD 10/23/17 2024

## 2017-10-23 NOTE — BH Assessment (Signed)
Assessment Note  Rodney Watson is an 52 y.o. male who presents to the ED with c/o SI and overdose. Pt reports that he was attempting to kill himself and reports taking 15 prescription pills followed by several pints of mouthwash for it's alcohol content. Pt tested positive for crack cocaine and his BAL upon arrival was 259. As this writer attempted to complete assessment with pt he stated "I'm done answering questions now" and refused to talk.   Pt has a hx of homelessness, drug/alcohol abuse, and psychiatric admissions. Pt was discharged from [facility] on 10/04/17 and from [facility] on 10/18/17.   Diagnosis: Depression  Past Medical History:  Past Medical History:  Diagnosis Date  . Alcohol abuse   . Alcohol abuse 04/01/2017  . COPD (chronic obstructive pulmonary disease) (HCC)   . Crack cocaine use   . Depression   . ETOH abuse   . Seizures (HCC)     Past Surgical History:  Procedure Laterality Date  . BACK SURGERY      Family History:  Family History  Problem Relation Age of Onset  . Alcohol abuse Father   . Seizures Neg Hx     Social History:  reports that he has been smoking cigarettes. He has a 58.50 pack-year smoking history. His smokeless tobacco use includes chew. He reports that he drinks alcohol. He reports that he has current or past drug history. Drugs: Marijuana and Cocaine.  Additional Social History:  Alcohol / Drug Use Pain Medications: SEE MAR Prescriptions: SEE MAR Over the Counter: SEE MAR History of alcohol / drug use?: Yes Substance #1 Name of Substance 1: Alcohol 1 - Last Use / Amount: 10/23/17 Substance #2 Name of Substance 2: Marijuana  CIWA: CIWA-Ar BP: 111/79 Pulse Rate: 100 Nausea and Vomiting: no nausea and no vomiting Tactile Disturbances: none Tremor: no tremor Auditory Disturbances: not present Paroxysmal Sweats: barely perceptible sweating, palms moist Visual Disturbances: not present Anxiety: no anxiety, at ease Headache, Fullness in  Head: very mild Agitation: normal activity Orientation and Clouding of Sensorium: oriented and can do serial additions CIWA-Ar Total: 2 COWS:    Allergies:  Allergies  Allergen Reactions  . Chocolate Hives  . Vicodin [Hydrocodone-Acetaminophen] Nausea And Vomiting    Home Medications:  (Not in a hospital admission)  OB/GYN Status:  No LMP for male patient.  General Assessment Data Location of Assessment: [facility] ED TTS Assessment: In system Is this a Tele or Face-to-Face Assessment?: Face-to-Face Is this an Initial Assessment or a Re-assessment for this encounter?: Initial Assessment Patient Accompanied by:: N/A Language Other than English: No Living Arrangements: Homeless/Shelter What gender do you identify as?: Male Marital status: Single Pregnancy Status: No Living Arrangements: Alone Can pt return to current living arrangement?: Yes Admission Status: Involuntary Petitioner: ED Attending Is patient capable of signing voluntary admission?: No Referral Source: Self/Family/Friend  Medical Screening Exam [facility] Walk-in ONLY) Medical Exam completed: Yes  Crisis Care Plan Living Arrangements: Alone Legal Guardian: Other:(Self) Name of Psychiatrist: none reported Name of Therapist: Pt possibly linked to [facility] Path team  Education Status Is patient currently in school?: No Highest grade of school patient has completed: 9th grade Is the patient employed, unemployed or receiving disability?: Unemployed  Risk to self with the past 6 months Suicidal Ideation: Yes-Currently Present Has patient been a risk to self within the past 6 months prior to admission? : Yes Suicidal Intent: Yes-Currently Present Has patient had any suicidal intent within the past 6 months prior to admission? :  Yes Is patient at risk for suicide?: Yes Suicidal Plan?: Yes-Currently Present Has patient had any suicidal plan within the past 6 months prior to admission? : Yes Specify Current Suicidal Plan:  Walk out in front of train Access to Means: Yes Specify Access to Suicidal Means: Local running train What has been your use of drugs/alcohol within the last 12 months?: current drug and alcohol abuser Previous Attempts/Gestures: Yes How many times?: 2 Other Self Harm Risks: n/a Triggers for Past Attempts: Unknown Intentional Self Injurious Behavior: None Comment - Self Injurious Behavior: Pt denies self injurious behaviore  Family Suicide History: Yes Recent stressful life event(s): Turmoil (Comment) Persecutory voices/beliefs?: No Depression: Yes Depression Symptoms: Isolating, Loss of interest in usual pleasures, Feeling worthless/self pity, Feeling angry/irritable Substance abuse history and/or treatment for substance abuse?: Yes Suicide prevention information given to non-admitted patients: Yes  Risk to Others within the past 6 months Homicidal Ideation: No Does patient have any lifetime risk of violence toward others beyond the six months prior to admission? : No Thoughts of Harm to Others: No Current Homicidal Intent: No Current Homicidal Plan: No Access to Homicidal Means: No Identified Victim: none identified History of harm to others?: No Assessment of Violence: None Noted Violent Behavior Description: (Unknown) Does patient have access to weapons?: No Criminal Charges Pending?: (Unknown) Does patient have a court date: (unknown) Court Date: (unknown) Is patient on probation?: Unknown  Psychosis Hallucinations: None noted Delusions: None noted  Mental Status Report Appearance/Hygiene: In scrubs Eye Contact: Poor Motor Activity: Freedom of movement Speech: Aggressive Level of Consciousness: Irritable Mood: Angry Affect: Angry, Irritable Anxiety Level: None Thought Processes: Coherent, Relevant Judgement: Unable to Assess Orientation: Appropriate for developmental age Obsessive Compulsive Thoughts/Behaviors: Unable to Assess  Cognitive  Functioning Concentration: Unable to Assess Memory: Unable to Assess Is patient IDD: No Insight: Unable to Assess Impulse Control: Unable to Assess Appetite: (unknown) Have you had any weight changes? : (unknown) Sleep: (unknown) Total Hours of Sleep: (unknown) Vegetative Symptoms: (unknown)  ADLScreening [facility] Assessment Services) Patient's cognitive ability adequate to safely complete daily activities?: Yes Patient able to express need for assistance with ADLs?: Yes Independently performs ADLs?: Yes (appropriate for developmental age)  Prior Inpatient Therapy Prior Inpatient Therapy: Yes Prior Therapy Dates: multiple hospitalizations in 2019 Prior Therapy Facilty/Provider(s): [facility] Reason for Treatment: depression, SI, alcohol use  Prior Outpatient Therapy Prior Outpatient Therapy: No Does patient have an ACCT team?: No Does patient have Intensive In-House Services?  : No Does patient have [facility] services? : No Does patient have P4CC services?: No  ADL Screening (condition at time of admission) Patient's cognitive ability adequate to safely complete daily activities?: Yes Is the patient deaf or have difficulty hearing?: No Does the patient have difficulty seeing, even when wearing glasses/contacts?: No Does the patient have difficulty concentrating, remembering, or making decisions?: No Patient able to express need for assistance with ADLs?: Yes Does the patient have difficulty dressing or bathing?: No Independently performs ADLs?: Yes (appropriate for developmental age) Does the patient have difficulty walking or climbing stairs?: No Weakness of Legs: None Weakness of Arms/Hands: None  Home Assistive Devices/Equipment Home Assistive Devices/Equipment: None  Therapy Consults (therapy consults require a physician order) PT Evaluation Needed: No OT Evalulation Needed: No SLP Evaluation Needed: No Abuse/Neglect Assessment (Assessment to be complete while patient is  alone) Abuse/Neglect Assessment Can Be Completed: Unable to assess, patient is non-responsive or altered mental status Values / Beliefs Cultural Requests During Hospitalization: None Spiritual Requests During  Hospitalization: None Consults Spiritual Care Consult Needed: No Social Work Consult Needed: No Merchant navy officer (For Healthcare) Does Patient Have a Medical Advance Directive?: No       Child/Adolescent Assessment Running Away Risk: (Pt is an adult)  Disposition:  Disposition Initial Assessment Completed for this Encounter: Yes Disposition of Patient: (Observation for medical clearance ) Patient refused recommended treatment: No Mode of transportation if patient is discharged?: Walking  On Site Evaluation by:   Reviewed with Physician:    Nahomy Limburg D Karrine Kluttz 10/23/2017 11:34 PM

## 2017-10-24 ENCOUNTER — Other Ambulatory Visit: Payer: Self-pay

## 2017-10-24 ENCOUNTER — Inpatient Hospital Stay
Admission: AD | Admit: 2017-10-24 | Discharge: 2017-10-29 | DRG: 885 | Disposition: A | Discharge status: Home / Self Care | Payer: Medicaid Other | Source: Other Acute Inpatient Hospital | Attending: Psychiatry | Admitting: Psychiatry

## 2017-10-24 DIAGNOSIS — E039 Hypothyroidism, unspecified: Secondary | ICD-10-CM | POA: Diagnosis present

## 2017-10-24 DIAGNOSIS — Z91018 Allergy to other foods: Secondary | ICD-10-CM

## 2017-10-24 DIAGNOSIS — F1490 Cocaine use, unspecified, uncomplicated: Secondary | ICD-10-CM | POA: Diagnosis present

## 2017-10-24 DIAGNOSIS — Z811 Family history of alcohol abuse and dependence: Secondary | ICD-10-CM

## 2017-10-24 DIAGNOSIS — Z9889 Other specified postprocedural states: Secondary | ICD-10-CM

## 2017-10-24 DIAGNOSIS — Z79899 Other long term (current) drug therapy: Secondary | ICD-10-CM | POA: Diagnosis not present

## 2017-10-24 DIAGNOSIS — G40909 Epilepsy, unspecified, not intractable, without status epilepticus: Secondary | ICD-10-CM | POA: Diagnosis present

## 2017-10-24 DIAGNOSIS — Z8659 Personal history of other mental and behavioral disorders: Secondary | ICD-10-CM

## 2017-10-24 DIAGNOSIS — F1024 Alcohol dependence with alcohol-induced mood disorder: Secondary | ICD-10-CM | POA: Diagnosis present

## 2017-10-24 DIAGNOSIS — F332 Major depressive disorder, recurrent severe without psychotic features: Secondary | ICD-10-CM | POA: Diagnosis present

## 2017-10-24 DIAGNOSIS — G4700 Insomnia, unspecified: Secondary | ICD-10-CM | POA: Diagnosis present

## 2017-10-24 DIAGNOSIS — F17210 Nicotine dependence, cigarettes, uncomplicated: Secondary | ICD-10-CM | POA: Diagnosis present

## 2017-10-24 DIAGNOSIS — Z915 Personal history of self-harm: Secondary | ICD-10-CM

## 2017-10-24 DIAGNOSIS — Z9119 Patient's noncompliance with other medical treatment and regimen: Secondary | ICD-10-CM | POA: Diagnosis not present

## 2017-10-24 DIAGNOSIS — Z79890 Hormone replacement therapy (postmenopausal): Secondary | ICD-10-CM

## 2017-10-24 DIAGNOSIS — T420X1A Poisoning by hydantoin derivatives, accidental (unintentional), initial encounter: Secondary | ICD-10-CM | POA: Diagnosis not present

## 2017-10-24 DIAGNOSIS — Z885 Allergy status to narcotic agent status: Secondary | ICD-10-CM

## 2017-10-24 DIAGNOSIS — J449 Chronic obstructive pulmonary disease, unspecified: Secondary | ICD-10-CM | POA: Diagnosis present

## 2017-10-24 DIAGNOSIS — F10231 Alcohol dependence with withdrawal delirium: Secondary | ICD-10-CM | POA: Diagnosis present

## 2017-10-24 DIAGNOSIS — Z590 Homelessness: Secondary | ICD-10-CM | POA: Diagnosis not present

## 2017-10-24 DIAGNOSIS — F1290 Cannabis use, unspecified, uncomplicated: Secondary | ICD-10-CM | POA: Diagnosis present

## 2017-10-24 DIAGNOSIS — F1014 Alcohol abuse with alcohol-induced mood disorder: Secondary | ICD-10-CM | POA: Diagnosis present

## 2017-10-24 DIAGNOSIS — I10 Essential (primary) hypertension: Secondary | ICD-10-CM | POA: Diagnosis present

## 2017-10-24 DIAGNOSIS — R45851 Suicidal ideations: Secondary | ICD-10-CM | POA: Diagnosis present

## 2017-10-24 DIAGNOSIS — T1491XA Suicide attempt, initial encounter: Secondary | ICD-10-CM

## 2017-10-24 LAB — PHENYTOIN LEVEL, TOTAL
PHENYTOIN LVL: 13.5 ug/mL (ref 10.0–20.0)
Phenytoin Lvl: 10.9 ug/mL (ref 10.0–20.0)

## 2017-10-24 MED ORDER — PHENYTOIN 50 MG PO CHEW
100.0000 mg | CHEWABLE_TABLET | Freq: Three times a day (TID) | ORAL | Status: DC
  Administered 2017-10-24 – 2017-10-29 (×14): 100 mg via ORAL
  Filled 2017-10-24 (×16): qty 2

## 2017-10-24 MED ORDER — NICOTINE 21 MG/24HR TD PT24
21.0000 mg | MEDICATED_PATCH | Freq: Every day | TRANSDERMAL | Status: DC
  Administered 2017-10-24 – 2017-10-28 (×5): 21 mg via TRANSDERMAL
  Filled 2017-10-24 (×5): qty 1

## 2017-10-24 MED ORDER — FLUOXETINE HCL 20 MG PO CAPS
20.0000 mg | ORAL_CAPSULE | Freq: Every day | ORAL | Status: DC
  Administered 2017-10-25 – 2017-10-29 (×5): 20 mg via ORAL
  Filled 2017-10-24 (×5): qty 1

## 2017-10-24 MED ORDER — LISINOPRIL 20 MG PO TABS
10.0000 mg | ORAL_TABLET | Freq: Every day | ORAL | Status: DC
  Administered 2017-10-25 – 2017-10-29 (×5): 10 mg via ORAL
  Filled 2017-10-24 (×5): qty 1

## 2017-10-24 MED ORDER — FOLIC ACID 1 MG PO TABS: 1.0000 mg | ORAL_TABLET | Freq: Every day | ORAL | Status: DC

## 2017-10-24 MED ORDER — GABAPENTIN 300 MG PO CAPS: 300.0000 mg | ORAL_CAPSULE | Freq: Three times a day (TID) | ORAL | Status: DC

## 2017-10-24 MED ORDER — LEVOTHYROXINE SODIUM 50 MCG PO TABS
25.0000 ug | ORAL_TABLET | Freq: Every day | ORAL | Status: DC
  Administered 2017-10-25 – 2017-10-29 (×5): 25 ug via ORAL
  Filled 2017-10-24 (×5): qty 1

## 2017-10-24 MED ORDER — LISINOPRIL 10 MG PO TABS: 10.0000 mg | ORAL_TABLET | Freq: Every day | ORAL | Status: DC

## 2017-10-24 MED ORDER — PHENYTOIN 50 MG PO CHEW: 100.0000 mg | CHEWABLE_TABLET | Freq: Three times a day (TID) | ORAL | Status: DC

## 2017-10-24 MED ORDER — QUETIAPINE FUMARATE 25 MG PO TABS
150.0000 mg | ORAL_TABLET | Freq: Every day | ORAL | Status: DC
  Administered 2017-10-24: 150 mg via ORAL
  Filled 2017-10-24: qty 1

## 2017-10-24 MED ORDER — FLUOXETINE HCL 20 MG PO CAPS: 20.0000 mg | ORAL_CAPSULE | Freq: Every day | ORAL | Status: DC

## 2017-10-24 MED ORDER — LEVOTHYROXINE SODIUM 50 MCG PO TABS: 25.0000 ug | ORAL_TABLET | Freq: Every day | ORAL | Status: DC

## 2017-10-24 MED ORDER — FOLIC ACID 1 MG PO TABS
1.0000 mg | ORAL_TABLET | Freq: Every day | ORAL | Status: DC
  Administered 2017-10-25 – 2017-10-29 (×5): 1 mg via ORAL
  Filled 2017-10-24 (×5): qty 1

## 2017-10-24 MED ORDER — GABAPENTIN 300 MG PO CAPS
300.0000 mg | ORAL_CAPSULE | Freq: Three times a day (TID) | ORAL | Status: DC
  Administered 2017-10-24 – 2017-10-29 (×14): 300 mg via ORAL
  Filled 2017-10-24 (×13): qty 1

## 2017-10-24 MED ORDER — QUETIAPINE FUMARATE 25 MG PO TABS: 150.0000 mg | ORAL_TABLET | Freq: Every day | ORAL | Status: DC

## 2017-10-24 NOTE — ED Notes (Signed)
ED  Is the patient under IVC or is there intent for IVC: Yes.   Is the patient medically cleared: Yes.   Is there vacancy in the ED BHU: Yes.   Is the population mix appropriate for patient: Yes.   Is the patient awaiting placement in inpatient or outpatient setting: Yes.   Has the patient had a psychiatric consult: Yes.   Survey of unit performed for contraband, proper placement and condition of furniture, tampering with fixtures in bathroom, shower, and each patient room: Yes.  ; Findings:  APPEARANCE/BEHAVIOR Calm and cooperative NEURO ASSESSMENT Orientation: oriented x3  Denies pain Hallucinations: No.None noted (Hallucinations) Speech: Normal Gait: normal RESPIRATORY ASSESSMENT Even  Unlabored respirations  CARDIOVASCULAR ASSESSMENT Pulses equal   regular rate  Skin warm and dry   GASTROINTESTINAL ASSESSMENT no GI complaint EXTREMITIES Full ROM  PLAN OF CARE Provide calm/safe environment. Vital signs assessed twice daily. ED BHU Assessment once each 12-hour shift. Collaborate withTTS daily or as condition indicates. Assure the ED provider has rounded once each shift. Provide and encourage hygiene. Provide redirection as needed. Assess for escalating behavior; address immediately and inform ED provider.  Assess family dynamic and appropriateness for visitation as needed: Yes.  ; If necessary, describe findings:  Educate the patient/family about BHU procedures/visitation: Yes.  ; If necessary, describe findings:   

## 2017-10-24 NOTE — ED Notes (Signed)
BEHAVIORAL HEALTH ROUNDING Patient sleeping: No. Patient alert and oriented: yes Behavior appropriate: Yes.  ; If no, describe:  Nutrition and fluids offered: yes Toileting and hygiene offered: Yes  Sitter present: q15 minute observations and security  monitoring Law enforcement present: Yes  ODS  

## 2017-10-24 NOTE — ED Provider Notes (Signed)
Reviewed phenytoin level, decreasing.  Spoke with Dr. Toni Amend of psychiatry, patient to be admitted here.   Governor Rooks, MD 10/24/17 [phone]
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
## 2017-10-24 NOTE — Plan of Care (Signed)
Patient newly admitted to unit, goals not met at this time. 

## 2017-10-24 NOTE — Consult Note (Signed)
[facility] Face-to-Face Psychiatry Consult   Reason for Consult: Consult for this 52 year old man with a history of depression and alcohol and cocaine abuse who comes to the hospital saying he made a suicide attempt Referring Physician: Reita Cliche Patient Identification: Rodney Watson MRN:  [id number] Principal Diagnosis: Major depressive disorder, recurrent episode, severe ([place]) Diagnosis:   Patient Active Problem List   Diagnosis Date Noted  . Suicide attempt (Rodney Watson) [T14.91XA] 10/24/2017  . Major depressive disorder, recurrent episode, severe ([place]) [F33.2] 09/29/2017  . Alcohol withdrawal ([place]) [F10.239] 07/18/2017  . Seizure disorder ([place]) [O53.664] 07/18/2017  . Essential hypertension [I10] 07/18/2017  . Alcohol abuse with alcohol-induced mood disorder ([place]) [F10.14] 04/10/2017    Total Time spent with patient: 1 hour  Subjective:   Rodney Watson is a 52 y.o. male patient admitted with "I just cannot go on anymore".  HPI: Patient reported to the emergency room after calling 911.  He says that he took an overdose of his Dilantin.  He says that for the last few days he has been staying outside.  He is been drinking heavily and using cocaine.  Drank about 2 bottles of mouthwash this morning.  Decided that he did not want to live anymore so he took 15 of his Dilantin but then he called 911.  Patient just got out of [facility] a few days ago and apparently immediately relapsed into heavy drinking and cocaine use.  Spent his entire check for September and has no place to stay.  Mood is very depressed tearful and sad.  Energy very poor.  Cannot sleep well.  Poor memory.  Tremulous all over.  Not eating decently.  Medical history: History of seizure disorder taking Dilantin.  History of high blood pressure  Social history: He does get disability related to his seizures but frequently blows all of it on alcohol and drug so he did ruins his chance of having a place to stay  Substance abuse history: Recently got  out of [facility].  Long history of alcohol and cocaine use.  May have had alcohol withdrawal seizures is a little hard to tell that from his other seizure disorder.  No clear evidence of DTs.  Past Psychiatric History: Patient has a history of depression usually in the context of substance abuse.  Multiple prior admissions.  Just discharged from our hospital within the last month.  He does have a past history of suicide attempts by cutting and now by overdosing.  No clear history of psychosis.  Risk to Self: Suicidal Ideation: Yes-Currently Present Suicidal Intent: Yes-Currently Present Is patient at risk for suicide?: Yes Suicidal Plan?: Yes-Currently Present Specify Current Suicidal Plan: Walk out in front of train Access to Means: Yes Specify Access to Suicidal Means: Local running train What has been your use of drugs/alcohol within the last 12 months?: current drug and alcohol abuser How many times?: 2 Other Self Harm Risks: n/a Triggers for Past Attempts: Unknown Intentional Self Injurious Behavior: None Comment - Self Injurious Behavior: Pt denies self injurious behaviore  Risk to Others: Homicidal Ideation: No Thoughts of Harm to Others: No Current Homicidal Intent: No Current Homicidal Plan: No Access to Homicidal Means: No Identified Victim: none identified History of harm to others?: No Assessment of Violence: None Noted Violent Behavior Description: (Unknown) Does patient have access to weapons?: No Criminal Charges Pending?: (Unknown) Does patient have a court date: (unknown) Court Date: (unknown) Prior Inpatient Therapy: Prior Inpatient Therapy: Yes Prior Therapy Dates: multiple hospitalizations in 2019 Prior Therapy Facilty/Provider(s):  [facility] Reason for Treatment: depression, SI, alcohol use Prior Outpatient Therapy: Prior Outpatient Therapy: No Does patient have an ACCT team?: No Does patient have Intensive In-House Services?  : No Does patient have [facility]  services? : No Does patient have P4CC services?: No  Past Medical History:  Past Medical History:  Diagnosis Date  . Alcohol abuse   . Alcohol abuse 04/01/2017  . COPD (chronic obstructive pulmonary disease) ([place])   . Crack cocaine use   . Depression   . ETOH abuse   . Seizures ([place])     Past Surgical History:  Procedure Laterality Date  . BACK SURGERY     Family History:  Family History  Problem Relation Age of Onset  . Alcohol abuse Father   . Seizures Neg Hx    Family Psychiatric  History: Denies any except for alcohol abuse in his father Social History:  Social History   Substance and Sexual Activity  Alcohol Use Yes   Comment: 5th daily     Social History   Substance and Sexual Activity  Drug Use Yes  . Types: Marijuana, Cocaine   Comment: 1-2 joints/week    Social History   Socioeconomic History  . Marital status: Single    Spouse name: Not on file  . Number of children: Not on file  . Years of education: Not on file  . Highest education level: Not on file  Occupational History  . Not on file  Social Needs  . Financial resource strain: Not on file  . Food insecurity:    Worry: Not on file    Inability: Not on file  . Transportation needs:    Medical: Not on file    Non-medical: Not on file  Tobacco Use  . Smoking status: Current Every Day Smoker    Packs/day: 1.50    Years: 39.00    Pack years: 58.50    Types: Cigarettes  . Smokeless tobacco: Current User    Types: Chew  Substance and Sexual Activity  . Alcohol use: Yes    Comment: 5th daily  . Drug use: Yes    Types: Marijuana, Cocaine    Comment: 1-2 joints/week  . Sexual activity: Never  Lifestyle  . Physical activity:    Days per week: Not on file    Minutes per session: Not on file  . Stress: Not on file  Relationships  . Social connections:    Talks on phone: Not on file    Gets together: Not on file    Attends religious service: Not on file    Active member of club or  organization: Not on file    Attends meetings of clubs or organizations: Not on file    Relationship status: Not on file  Other Topics Concern  . Not on file  Social History Narrative   ** Merged History Encounter **       Additional Social History:    Allergies:   Allergies  Allergen Reactions  . Chocolate Hives  . Vicodin [Hydrocodone-Acetaminophen] Nausea And Vomiting    Labs:  Results for orders placed or performed during the hospital encounter of 10/23/17 (from the past 48 hour(s))  CBC with Differential     Status: Abnormal   Collection Time: 10/23/17  5:46 PM  Result Value Ref Range   WBC 9.6 3.8 - 10.6 K/uL   RBC 4.86 4.40 - 5.90 MIL/uL   Hemoglobin 15.4 13.0 - 18.0 g/dL   HCT 44.3 40.0 -  52.0 %   MCV 91.3 80.0 - 100.0 fL   MCH 31.7 26.0 - 34.0 pg   MCHC 34.8 32.0 - 36.0 g/dL   RDW 15.0 (H) 11.5 - 14.5 %   Platelets 228 150 - 440 K/uL   Neutrophils Relative % 51 %   Neutro Abs 4.8 1.4 - 6.5 K/uL   Lymphocytes Relative 37 %   Lymphs Abs 3.6 1.0 - 3.6 K/uL   Monocytes Relative 10 %   Monocytes Absolute 1.0 0.2 - 1.0 K/uL   Eosinophils Relative 1 %   Eosinophils Absolute 0.1 0 - 0.7 K/uL   Basophils Relative 1 %   Basophils Absolute 0.1 0 - 0.1 K/uL    Comment: Performed at [facility], [address] [place], [place] [address]  Comprehensive metabolic panel     Status: Abnormal   Collection Time: 10/23/17  5:46 PM  Result Value Ref Range   Sodium 140 135 - 145 mmol/L   Potassium 3.5 3.5 - 5.1 mmol/L   Chloride 106 98 - 111 mmol/L   CO2 22 22 - 32 mmol/L   Glucose, Bld 97 70 - 99 mg/dL   BUN 12 6 - 20 mg/dL   Creatinine, Ser 0.70 0.61 - 1.24 mg/dL   Calcium 8.5 (L) 8.9 - 10.3 mg/dL   Total Protein 7.3 6.5 - 8.1 g/dL   Albumin 4.3 3.5 - 5.0 g/dL   AST 38 15 - 41 U/L   ALT 36 0 - 44 U/L   Alkaline Phosphatase 81 38 - 126 U/L   Total Bilirubin 1.0 0.3 - 1.2 mg/dL   GFR calc non Af Amer >60 >60 mL/min   GFR calc Af Amer >60 >60 mL/min     Comment: (NOTE) The eGFR has been calculated using the CKD EPI equation. This calculation has not been validated in all clinical situations. eGFR's persistently <60 mL/min signify possible Chronic Kidney Disease.    Anion gap 12 5 - 15    Comment: Performed at [facility], [place], [place] [address]  Acetaminophen level     Status: Abnormal   Collection Time: 10/23/17  5:46 PM  Result Value Ref Range   Acetaminophen (Tylenol), Serum <10 (L) 10 - 30 ug/mL    Comment: (NOTE) Therapeutic concentrations vary significantly. A range of 10-30 ug/mL  may be an effective concentration for many patients. However, some  are best treated at concentrations outside of this range. Acetaminophen concentrations >150 ug/mL at 4 hours after ingestion  and >50 ug/mL at 12 hours after ingestion are often associated with  toxic reactions. Performed at [facility], [address] [place], [place] [address]   Salicylate level     Status: None   Collection Time: 10/23/17  5:46 PM  Result Value Ref Range   Salicylate Lvl <9.1 2.8 - 30.0 mg/dL    Comment: Performed at [facility], [address] [place], [place] [address]  Urinalysis, Complete w Microscopic     Status: Abnormal   Collection Time: 10/23/17  5:46 PM  Result Value Ref Range   Color, Urine YELLOW (A) YELLOW   APPearance CLEAR (A) CLEAR   Specific Gravity, Urine 1.024 1.005 - 1.030   pH 5.0 5.0 - 8.0   Glucose, UA NEGATIVE NEGATIVE mg/dL   Hgb urine dipstick SMALL (A) NEGATIVE   Bilirubin Urine NEGATIVE NEGATIVE   Ketones, ur NEGATIVE NEGATIVE mg/dL   Protein, ur NEGATIVE NEGATIVE mg/dL   Nitrite NEGATIVE  NEGATIVE   Leukocytes, UA NEGATIVE NEGATIVE   RBC / HPF 0-5 0 - 5 RBC/hpf   WBC, UA 0-5 0 - 5 WBC/hpf   Bacteria, UA NONE SEEN NONE SEEN   Squamous Epithelial / LPF 0-5 0 - 5   Mucus PRESENT     Comment: Performed at [facility], [address], [place], [place] [address]   Urine Drug Screen, Qualitative ([place] only)     Status: Abnormal   Collection Time: 10/23/17  5:46 PM  Result Value Ref Range   Tricyclic, Ur Screen NONE DETECTED NONE DETECTED   Amphetamines, Ur Screen NONE DETECTED NONE DETECTED   MDMA (Ecstasy)Ur Screen NONE DETECTED NONE DETECTED   Cocaine Metabolite,Ur [place] POSITIVE (A) NONE DETECTED   Opiate, Ur Screen NONE DETECTED NONE DETECTED   Phencyclidine (PCP) Ur S NONE DETECTED NONE DETECTED   Cannabinoid 50 Ng, Ur [place] NONE DETECTED NONE DETECTED   Barbiturates, Ur Screen NONE DETECTED NONE DETECTED   Benzodiazepine, Ur Scrn TEST NOT PERFORMED, REAGENT NOT AVAILABLE (A) NONE DETECTED   Methadone Scn, Ur NONE DETECTED NONE DETECTED    Comment: (NOTE) Tricyclics + metabolites, urine    Cutoff 1000 ng/mL Amphetamines + metabolites, urine  Cutoff 1000 ng/mL MDMA (Ecstasy), urine              Cutoff 500 ng/mL Cocaine Metabolite, urine          Cutoff 300 ng/mL Opiate + metabolites, urine        Cutoff 300 ng/mL Phencyclidine (PCP), urine         Cutoff 25 ng/mL Cannabinoid, urine                 Cutoff 50 ng/mL Barbiturates + metabolites, urine  Cutoff 200 ng/mL Benzodiazepine, urine              Cutoff 200 ng/mL Methadone, urine                   Cutoff 300 ng/mL The urine drug screen provides only a preliminary, unconfirmed analytical test result and should not be used for non-medical purposes. Clinical consideration and professional judgment should be applied to any positive drug screen result due to possible interfering substances. A more specific alternate chemical method must be used in order to obtain a confirmed analytical result. Gas chromatography / mass spectrometry (GC/MS) is the preferred confirmat ory method. Performed at [facility], [address] [place], [place] [address]   Ethanol     Status: Abnormal   Collection Time: 10/23/17  5:46 PM  Result Value Ref Range   Alcohol, Ethyl (B) 259 (H) <10 mg/dL     Comment: (NOTE) Lowest detectable limit for serum alcohol is 10 mg/dL. For medical purposes only. Performed at [facility], [address] [place], [place] [address]   Phenytoin level, total     Status: None   Collection Time: 10/23/17  5:46 PM  Result Value Ref Range   Phenytoin Lvl 16.8 10.0 - 20.0 ug/mL    Comment: Performed at [facility], [address] [place], [place] [address]  Phenytoin level, total     Status: None   Collection Time: 10/24/17 12:13 AM  Result Value Ref Range   Phenytoin Lvl 13.5 10.0 - 20.0 ug/mL    Comment: Performed at [facility], [address] [place],  [address]    Current Facility-Administered Medications  Medication Dose Route Frequency Provider Last Rate Last Dose  .  FLUoxetine (PROZAC) capsule 20 mg  20 mg Oral Daily Birdie Beveridge T, MD      . folic acid (FOLVITE) tablet 1 mg  1 mg Oral Daily Teira Arcilla T, MD      . gabapentin (NEURONTIN) capsule 300 mg  300 mg Oral TID Anda Sobotta, Madie Reno, MD      . Derrill Memo ON 10/25/2017] levothyroxine (SYNTHROID, LEVOTHROID) tablet 25 mcg  25 mcg Oral QAC breakfast Manveer Gomes T, MD      . lisinopril (PRINIVIL,ZESTRIL) tablet 10 mg  10 mg Oral Daily Savhanna Sliva T, MD      . LORazepam (ATIVAN) injection 0-4 mg  0-4 mg Intravenous Q6H Schaevitz, Randall An, MD       Or  . LORazepam (ATIVAN) tablet 0-4 mg  0-4 mg Oral Q6H Schaevitz, Randall An, MD   2 mg at 10/23/17 2031  . [START ON 10/26/2017] LORazepam (ATIVAN) injection 0-4 mg  0-4 mg Intravenous Q12H Orbie Pyo, MD       Or  . Derrill Memo ON 10/26/2017] LORazepam (ATIVAN) tablet 0-4 mg  0-4 mg Oral Q12H Schaevitz, Randall An, MD      . phenytoin (DILANTIN) chewable tablet 100 mg  100 mg Oral TID Dalayna Lauter T, MD      . QUEtiapine (SEROQUEL) tablet 150 mg  150 mg Oral QHS Granville [place] T, MD      . thiamine (VITAMIN B-1) tablet 100 mg  100 mg Oral Daily Orbie Pyo, MD   100 mg at 10/23/17  2031   Or  . thiamine (B-1) injection 100 mg  100 mg Intravenous Daily Orbie Pyo, MD       Current Outpatient Medications  Medication Sig Dispense Refill  . FLUoxetine (PROZAC) 20 MG capsule Take 1 capsule (20 mg total) by mouth daily. 30 capsule 0  . folic acid (FOLVITE) 1 MG tablet Take 1 tablet (1 mg total) by mouth daily. 30 tablet 0  . gabapentin (NEURONTIN) 300 MG capsule Take 1 capsule (300 mg total) by mouth 3 (three) times daily. 90 capsule 0  . levothyroxine (SYNTHROID, LEVOTHROID) 25 MCG tablet Take 1 tablet (25 mcg total) by mouth daily before breakfast.    . lisinopril (PRINIVIL,ZESTRIL) 10 MG tablet Take 1 tablet (10 mg total) by mouth daily. For high blood pressure 30 tablet 0  . phenytoin (DILANTIN) 100 MG ER capsule Take 1 capsule (100 mg total) by mouth 3 (three) times daily. 90 capsule 1  . QUEtiapine (SEROQUEL) 50 MG tablet Take 3 tablets (150 mg total) by mouth at bedtime.    . thiamine 100 MG tablet Take 1 tablet (100 mg total) by mouth daily. 30 tablet 0    Musculoskeletal: Strength & Muscle Tone: decreased Gait & Station: unsteady Patient leans: N/A  Psychiatric Specialty Exam: Physical Exam  Nursing note and vitals reviewed. Constitutional: He appears well-developed and well-nourished.  HENT:  Head: Normocephalic and atraumatic.  Eyes: Pupils are equal, round, and reactive to light. Conjunctivae are normal.  Neck: Normal range of motion.  Cardiovascular: Normal heart sounds.  Respiratory: Effort normal.  GI: Soft.  Musculoskeletal: Normal range of motion.  Neurological: He is alert. He displays tremor.  Tremulous all over  Skin: Skin is warm and dry.  Psychiatric: His mood appears anxious. His speech is delayed. He is slowed. He expresses impulsivity. He exhibits a depressed mood. He expresses suicidal ideation. He expresses suicidal plans. He exhibits abnormal recent memory.    Review of Systems  Constitutional: Negative.   HENT:  Negative.   Eyes: Negative.   Respiratory: Negative.   Cardiovascular: Negative.   Gastrointestinal: Negative.   Musculoskeletal: Negative.   Skin: Negative.   Neurological: Negative.   Psychiatric/Behavioral: Positive for depression, hallucinations, memory loss, substance abuse and suicidal ideas. The patient is nervous/anxious and has insomnia.     Blood pressure 130/83, pulse 93, temperature 99 F (37.2 C), temperature source Oral, resp. rate 16, height 5' 8"  (1.727 m), weight 63.5 kg, SpO2 95 %.Body mass index is 21.29 kg/m.  General Appearance: Disheveled  Eye Contact:  Fair  Speech:  Slow  Volume:  Decreased  Mood:  Depressed  Affect:  Depressed  Thought Process:  Goal Directed  Orientation:  Full (Time, Place, and Person)  Thought Content:  Logical  Suicidal Thoughts:  Yes.  with intent/plan  Homicidal Thoughts:  No  Memory:  Immediate;   Fair Recent;   Fair Remote;   Fair  Judgement:  Fair  Insight:  Fair  Psychomotor Activity:  Decreased and Tremor  Concentration:  Concentration: Poor  Recall:  [employer] of Knowledge:  Fair  Language:  Fair  Akathisia:  No  Handed:  Right  AIMS (if indicated):     Assets:  Desire for Improvement  ADL's:  Impaired  Cognition:  Impaired,  Mild  Sleep:        Treatment Plan Summary: Daily contact with patient to assess and evaluate symptoms and progress in treatment, Medication management and Plan Patient's Dilantin level was still in the normal range even after following up on it from his "overdose".  I will restart the Dilantin at his usual dose and continue medicines as they were when he was discharged.  Restart alcohol withdrawal orders.  Patient had a suicide attempt and meets criteria for inpatient hospitalization.  Admit to psychiatric ward 15-minute checks usual routine of individual and group therapy and medication management.  Disposition: Recommend psychiatric Inpatient admission when medically cleared. Supportive  therapy provided about ongoing stressors.  Alethia Berthold, MD 10/24/2017 3:43 PM

## 2017-10-24 NOTE — Consult Note (Signed)
Psychiatry: Case reviewed.  Patient seen.  Full note to follow.  Patient continues to endorse suicidal ideation.  Most likely will require inpatient hospitalization.  Continue current treatment plan pending further orders.  Case reviewed with TTS and ER physician

## 2017-10-24 NOTE — ED Notes (Signed)

## 2017-10-24 NOTE — ED Notes (Signed)
Patient observed lying in hallway bed with eyes closed  Even, unlabored respirations observed   NAD pt appears to be sleeping  I will continue to monitor along with every 15 minute visual observations and ongoing security monitoring    

## 2017-10-24 NOTE — ED Notes (Signed)
Per Dr Lamont Snowball, pt is medically clear and can go to the quad. Pt is awake and alert. Pt states he wants treatment. Told pt they came to speak with him concerning treatment but he refused to talk to them. Told pt that he would need to speak with them later if they could get him in anywhere. Pt is stable at this time.

## 2017-10-24 NOTE — Tx Team (Signed)
Initial Treatment Plan 10/24/2017 5:38 PM Rodney Watson [id number]    PATIENT STRESSORS: Financial difficulties Substance abuse   PATIENT STRENGTHS: Ability for insight Active sense of humor   PATIENT IDENTIFIED PROBLEMS: Homelessness  "Overall life in general."                   DISCHARGE CRITERIA:  Ability to meet basic life and health needs Adequate post-discharge living arrangements Improved stabilization in mood, thinking, and/or behavior  PRELIMINARY DISCHARGE PLAN: Outpatient therapy Placement in alternative living arrangements  PATIENT/FAMILY INVOLVEMENT: This treatment plan has been presented to and reviewed with the patient, Rodney Watson, and/or family member.  The patient and family have been given the opportunity to ask questions and make suggestions.  Jim Desanctis Micheil Klaus, RN 10/24/2017, 5:38 PM

## 2017-10-24 NOTE — Progress Notes (Signed)
Patient has been in the dayroom with staff and peers. Pleasant and cooperative. Denying thoughts of self harm.ood improved. Currently attending group and appears to be focused. Staff will continue to monitor.

## 2017-10-24 NOTE — Plan of Care (Signed)
Mood improved. Active in the milieu, pleasant and cooperative

## 2017-10-24 NOTE — ED Notes (Signed)
Q15 minute rounding sheet done on patient by this EDT from [id number]. Patient is currently sleeping at this time. Will continue to monitor.

## 2017-10-24 NOTE — BH Assessment (Signed)
Patient is to be admitted to [facility] by Dr. Toni Amend.  Attending Physician will be Dr. Johnella Moloney.   Patient has been assigned to room 323, by [facility] Charge Nurse T'Yawn.   ER staff is aware of the admission:  Gwendolyn S. Patient Access.

## 2017-10-24 NOTE — Progress Notes (Signed)
52 year old male admitted to unit. Alert and oriented x4. Reports having passive SI, but currently denies specific plan at present time. Patient verbally contracts for safety. Denies current HI, AVH and. Patient reports that current stressors are the fact that he is homeless and "life overall." Oriented patient to room and unit. Skin and contraband search completed and witnessed by [facility]. Multiple tattoos to R) arm observed, skin otherwise warm, very dry/itchy and intact. Body lotion given to patient for his dry skin. No contraband found on patient nor his belongings. Home medications sent to pharmacy for storage. Admission assessment completed, fluid and nutrition offered. Patient remains safe on the unit with q 15 minute checks.

## 2017-10-25 DIAGNOSIS — F332 Major depressive disorder, recurrent severe without psychotic features: Secondary | ICD-10-CM | POA: Diagnosis present

## 2017-10-25 LAB — TSH: TSH: 1.468 u[IU]/mL (ref 0.350–4.500)

## 2017-10-25 MED ORDER — CHLORDIAZEPOXIDE HCL 25 MG PO CAPS
25.0000 mg | ORAL_CAPSULE | Freq: Three times a day (TID) | ORAL | Status: AC
  Administered 2017-10-25 (×3): 25 mg via ORAL
  Filled 2017-10-25 (×2): qty 1

## 2017-10-25 MED ORDER — CHLORDIAZEPOXIDE HCL 25 MG PO CAPS: 25.0000 mg | ORAL_CAPSULE | ORAL | Status: DC

## 2017-10-25 MED ORDER — ADULT MULTIVITAMIN W/MINERALS CH
1.0000 | ORAL_TABLET | Freq: Every day | ORAL | Status: DC
  Administered 2017-10-25 – 2017-10-29 (×5): 1 via ORAL
  Filled 2017-10-25 (×5): qty 1

## 2017-10-25 MED ORDER — CHLORDIAZEPOXIDE HCL 25 MG PO CAPS: 25.0000 mg | ORAL_CAPSULE | Freq: Three times a day (TID) | ORAL | Status: DC

## 2017-10-25 MED ORDER — QUETIAPINE FUMARATE 200 MG PO TABS
200.0000 mg | ORAL_TABLET | Freq: Every day | ORAL | Status: DC
  Administered 2017-10-25 – 2017-10-28 (×4): 200 mg via ORAL
  Filled 2017-10-25 (×5): qty 1

## 2017-10-25 MED ORDER — ONDANSETRON 4 MG PO TBDP: 4.0000 mg | ORAL_TABLET | Freq: Four times a day (QID) | ORAL | Status: DC | PRN

## 2017-10-25 MED ORDER — LORAZEPAM 2 MG PO TABS: 0.0000 mg | ORAL_TABLET | Freq: Two times a day (BID) | ORAL | Status: DC

## 2017-10-25 MED ORDER — ALUM & MAG HYDROXIDE-SIMETH 200-200-20 MG/5ML PO SUSP: 30.0000 mL | ORAL | Status: DC | PRN

## 2017-10-25 MED ORDER — CHLORDIAZEPOXIDE HCL 25 MG PO CAPS
25.0000 mg | ORAL_CAPSULE | Freq: Two times a day (BID) | ORAL | Status: AC
  Administered 2017-10-26 (×2): 25 mg via ORAL
  Filled 2017-10-25 (×2): qty 1

## 2017-10-25 MED ORDER — CHLORDIAZEPOXIDE HCL 25 MG PO CAPS: 25.0000 mg | ORAL_CAPSULE | Freq: Every day | ORAL | Status: DC

## 2017-10-25 MED ORDER — CHLORDIAZEPOXIDE HCL 25 MG PO CAPS: 25.0000 mg | ORAL_CAPSULE | Freq: Four times a day (QID) | ORAL | Status: DC | PRN

## 2017-10-25 MED ORDER — CHLORDIAZEPOXIDE HCL 25 MG PO CAPS: 25.0000 mg | ORAL_CAPSULE | Freq: Four times a day (QID) | ORAL | Status: DC

## 2017-10-25 MED ORDER — THIAMINE HCL 100 MG/ML IJ SOLN
100.0000 mg | Freq: Once | INTRAMUSCULAR | Status: AC
  Administered 2017-10-25: 100 mg via INTRAMUSCULAR
  Filled 2017-10-25: qty 1

## 2017-10-25 MED ORDER — HYDROXYZINE HCL 25 MG PO TABS: 25.0000 mg | ORAL_TABLET | Freq: Four times a day (QID) | ORAL | Status: DC | PRN

## 2017-10-25 MED ORDER — MAGNESIUM HYDROXIDE 400 MG/5ML PO SUSP: 30.0000 mL | Freq: Every day | ORAL | Status: DC | PRN

## 2017-10-25 MED ORDER — LORAZEPAM 2 MG PO TABS: 0.0000 mg | ORAL_TABLET | Freq: Four times a day (QID) | ORAL | Status: DC

## 2017-10-25 MED ORDER — THIAMINE HCL 100 MG/ML IJ SOLN: 100.0000 mg | Freq: Every day | INTRAMUSCULAR | Status: DC

## 2017-10-25 MED ORDER — LORAZEPAM 2 MG/ML IJ SOLN: 0.0000 mg | Freq: Four times a day (QID) | INTRAMUSCULAR | Status: DC

## 2017-10-25 MED ORDER — LOPERAMIDE HCL 2 MG PO CAPS: 2.0000 mg | ORAL_CAPSULE | ORAL | Status: DC | PRN

## 2017-10-25 MED ORDER — VITAMIN B-1 100 MG PO TABS: 100.0000 mg | ORAL_TABLET | Freq: Every day | ORAL | Status: DC

## 2017-10-25 MED ORDER — ENSURE ENLIVE PO LIQD
237.0000 mL | Freq: Three times a day (TID) | ORAL | Status: DC
  Administered 2017-10-25 – 2017-10-29 (×12): 237 mL via ORAL

## 2017-10-25 MED ORDER — VITAMIN B-1 100 MG PO TABS
100.0000 mg | ORAL_TABLET | Freq: Every day | ORAL | Status: DC
  Administered 2017-10-26 – 2017-10-29 (×4): 100 mg via ORAL
  Filled 2017-10-25 (×4): qty 1

## 2017-10-25 MED ORDER — ACETAMINOPHEN 325 MG PO TABS: 650.0000 mg | ORAL_TABLET | Freq: Four times a day (QID) | ORAL | Status: DC | PRN

## 2017-10-25 MED ORDER — FLUOXETINE HCL 20 MG PO CAPS: 20.0000 mg | ORAL_CAPSULE | Freq: Every day | ORAL | Status: DC

## 2017-10-25 MED ORDER — CHLORDIAZEPOXIDE HCL 25 MG PO CAPS
25.0000 mg | ORAL_CAPSULE | Freq: Every day | ORAL | Status: AC
  Administered 2017-10-27: 25 mg via ORAL
  Filled 2017-10-25: qty 1

## 2017-10-25 MED ORDER — LORAZEPAM 2 MG/ML IJ SOLN: 0.0000 mg | Freq: Two times a day (BID) | INTRAMUSCULAR | Status: DC

## 2017-10-25 NOTE — Tx Team (Signed)
Interdisciplinary Treatment and Diagnostic Plan Update  10/25/2017 Time of Session: 11:00 AM Rodney Watson MRN: [id number]  Principal Diagnosis: Severe recurrent major depression without psychotic features [facility])  Secondary Diagnoses: Principal Problem:   Severe recurrent major depression without psychotic features (HCC) Active Problems:   Alcohol abuse with alcohol-induced mood disorder (HCC)   Current Medications:  Current Facility-Administered Medications  Medication Dose Route Frequency Provider Last Rate Last Dose  . acetaminophen (TYLENOL) tablet 650 mg  650 mg Oral Q6H PRN Clapacs, John T, MD      . alum & mag hydroxide-simeth (MAALOX/MYLANTA) 200-200-20 MG/5ML suspension 30 mL  30 mL Oral Q4H PRN Clapacs, John T, MD      . chlordiazePOXIDE (LIBRIUM) capsule 25 mg  25 mg Oral Q6H PRN McNew, Ileene Hutchinson, MD      . Melene Muller ON 10/26/2017] chlordiazePOXIDE (LIBRIUM) capsule 25 mg  25 mg Oral BID McNew, Ileene Hutchinson, MD       Followed by  . [START ON 10/27/2017] chlordiazePOXIDE (LIBRIUM) capsule 25 mg  25 mg Oral Daily McNew, Holly R, MD      . feeding supplement (ENSURE ENLIVE) (ENSURE ENLIVE) liquid 237 mL  237 mL Oral TID BM McNew, Holly R, MD   237 mL at 10/25/17 1400  . FLUoxetine (PROZAC) capsule 20 mg  20 mg Oral Daily Clapacs, Jackquline Denmark, MD   20 mg at 10/25/17 0919  . folic acid (FOLVITE) tablet 1 mg  1 mg Oral Daily Clapacs, Jackquline Denmark, MD   1 mg at 10/25/17 0920  . gabapentin (NEURONTIN) capsule 300 mg  300 mg Oral TID Clapacs, John T, MD   300 mg at 10/25/17 1618  . hydrOXYzine (ATARAX/VISTARIL) tablet 25 mg  25 mg Oral Q6H PRN McNew, Ileene Hutchinson, MD      . levothyroxine (SYNTHROID, LEVOTHROID) tablet 25 mcg  25 mcg Oral QAC breakfast Clapacs, Jackquline Denmark, MD   25 mcg at 10/25/17 0920  . lisinopril (PRINIVIL,ZESTRIL) tablet 10 mg  10 mg Oral Daily Clapacs, Jackquline Denmark, MD   10 mg at 10/25/17 0920  . loperamide (IMODIUM) capsule 2-4 mg  2-4 mg Oral PRN McNew, Ileene Hutchinson, MD      . magnesium hydroxide (MILK OF  MAGNESIA) suspension 30 mL  30 mL Oral Daily PRN Clapacs, John T, MD      . multivitamin with minerals tablet 1 tablet  1 tablet Oral Daily McNew, Ileene Hutchinson, MD   1 tablet at 10/25/17 0927  . nicotine (NICODERM CQ - dosed in mg/24 hours) patch 21 mg  21 mg Transdermal Daily Clapacs, Jackquline Denmark, MD   21 mg at 10/25/17 0923  . ondansetron (ZOFRAN-ODT) disintegrating tablet 4 mg  4 mg Oral Q6H PRN McNew, Ileene Hutchinson, MD      . phenytoin (DILANTIN) chewable tablet 100 mg  100 mg Oral TID Clapacs, Jackquline Denmark, MD   100 mg at 10/25/17 1618  . QUEtiapine (SEROQUEL) tablet 200 mg  200 mg Oral QHS McNew, Holly R, MD      . thiamine (B-1) injection 100 mg  100 mg Intramuscular Once McNew, Ileene Hutchinson, MD      . Melene Muller ON 10/26/2017] thiamine (VITAMIN B-1) tablet 100 mg  100 mg Oral Daily McNew, Ileene Hutchinson, MD       PTA Medications: Medications Prior to Admission  Medication Sig Dispense Refill Last Dose  . FLUoxetine (PROZAC) 20 MG capsule Take 1 capsule (20 mg total) by mouth daily. 30 capsule 0 unknown  at unknown  . folic acid (FOLVITE) 1 MG tablet Take 1 tablet (1 mg total) by mouth daily. 30 tablet 0 unknown at unknown  . gabapentin (NEURONTIN) 300 MG capsule Take 1 capsule (300 mg total) by mouth 3 (three) times daily. 90 capsule 0 unknown at unknown  . levothyroxine (SYNTHROID, LEVOTHROID) 25 MCG tablet Take 1 tablet (25 mcg total) by mouth daily before breakfast.   unknown at unknown  . lisinopril (PRINIVIL,ZESTRIL) 10 MG tablet Take 1 tablet (10 mg total) by mouth daily. For high blood pressure 30 tablet 0 unknown at unknown  . phenytoin (DILANTIN) 100 MG ER capsule Take 1 capsule (100 mg total) by mouth 3 (three) times daily. 90 capsule 1 unknown at unknown  . QUEtiapine (SEROQUEL) 50 MG tablet Take 3 tablets (150 mg total) by mouth at bedtime.   unknown at unknown  . thiamine 100 MG tablet Take 1 tablet (100 mg total) by mouth daily. 30 tablet 0 unknown at unknown    Patient Stressors: Financial  difficulties Substance abuse  Patient Strengths: Ability for insight Active sense of humor  Treatment Modalities: Medication Management, Group therapy, Case management,  1 to 1 session with clinician, Psychoeducation, Recreational therapy.   Physician Treatment Plan for Primary Diagnosis: Severe recurrent major depression without psychotic features (HCC) Long Term Goal(s): Improvement in symptoms so as ready for discharge   Short Term Goals: Ability to disclose and discuss suicidal ideas  Medication Management: Evaluate patient's response, side effects, and tolerance of medication regimen.  Therapeutic Interventions: 1 to 1 sessions, Unit Group sessions and Medication administration.  Evaluation of Outcomes: Progressing  Physician Treatment Plan for Secondary Diagnosis: Principal Problem:   Severe recurrent major depression without psychotic features (HCC) Active Problems:   Alcohol abuse with alcohol-induced mood disorder (HCC)  Long Term Goal(s): Improvement in symptoms so as ready for discharge   Short Term Goals: Ability to disclose and discuss suicidal ideas     Medication Management: Evaluate patient's response, side effects, and tolerance of medication regimen.  Therapeutic Interventions: 1 to 1 sessions, Unit Group sessions and Medication administration.  Evaluation of Outcomes: Progressing   RN Treatment Plan for Primary Diagnosis: Severe recurrent major depression without psychotic features (HCC) Long Term Goal(s): Knowledge of disease and therapeutic regimen to maintain health will improve  Short Term Goals: Ability to participate in decision making will improve, Ability to disclose and discuss suicidal ideas, Ability to identify and develop effective coping behaviors will improve and Compliance with prescribed medications will improve  Medication Management: RN will administer medications as ordered by provider, will assess and evaluate patient's response and  provide education to patient for prescribed medication. RN will report any adverse and/or side effects to prescribing provider.  Therapeutic Interventions: 1 on 1 counseling sessions, Psychoeducation, Medication administration, Evaluate responses to treatment, Monitor vital signs and CBGs as ordered, Perform/monitor CIWA, COWS, AIMS and Fall Risk screenings as ordered, Perform wound care treatments as ordered.  Evaluation of Outcomes: Progressing   LCSW Treatment Plan for Primary Diagnosis: Severe recurrent major depression without psychotic features (HCC) Long Term Goal(s): Safe transition to appropriate next level of care at discharge, Engage patient in therapeutic group addressing interpersonal concerns.  Short Term Goals: Engage patient in aftercare planning with referrals and resources and Increase skills for wellness and recovery  Therapeutic Interventions: Assess for all discharge needs, 1 to 1 time with Social worker, Explore available resources and support systems, Assess for adequacy in community support network, Educate family and  significant other(s) on suicide prevention, Complete Psychosocial Assessment, Interpersonal group therapy.  Evaluation of Outcomes: Progressing   Progress in Treatment: Attending groups: Yes. Participating in groups: No. Taking medication as prescribed: Yes. Toleration medication: Yes. Family/Significant other contact made: No, will contact:  Pt denied involvement of any supports Patient understands diagnosis: Yes. Discussing patient identified problems/goals with staff: Yes. Medical problems stabilized or resolved: Yes. Denies suicidal/homicidal ideation: No. Issues/concerns per patient self-inventory: No. Other: n/a  New problem(s) identified: No, Describe:  No new problems identified  New Short Term/Long Term Goal(s):  Patient Goals:  "I want to get better and get off the alcohol"  Discharge Plan or Barriers: Tentative plan is for pt to be  admitted into a rehab facility.  He is interested in a referral to [facility].  Reason for Continuation of Hospitalization: Depression Medication stabilization Suicidal ideation  Estimated Length of Stay: 3-5 days  Attendees: Patient: Rodney Watson 10/25/2017 4:54 PM  Physician: Corinna Gab, MD 10/25/2017 4:54 PM  Nursing: Horton Marshall, RN 10/25/2017 4:54 PM  RN Care Manager: 10/25/2017 4:54 PM  Social Worker: Huey Romans, LCSW 10/25/2017 4:54 PM  Recreational Therapist: Garret Reddish, LRT 10/25/2017 4:54 PM  Other: Garner Nash, LCSW 10/25/2017 4:54 PM  Other: Damian Leavell, Chaplain 10/25/2017 4:54 PM  Other: 10/25/2017 4:54 PM    Scribe for Treatment Team: Alease Frame, LCSW 10/25/2017 4:54 PM

## 2017-10-25 NOTE — Tx Team (Addendum)
Interdisciplinary Treatment and Diagnostic Plan Update  10/25/2017 Time of Session: 1105 Rodney Watson MRN: [id number]  Principal Diagnosis: Severe recurrent major depression without psychotic features [facility])  Secondary Diagnoses: Principal Problem:   Severe recurrent major depression without psychotic features ([place]) Active Problems:   Alcohol abuse with alcohol-induced mood disorder ([place])   Current Medications:  Current Facility-Administered Medications  Medication Dose Route Frequency Provider Last Rate Last Dose  . acetaminophen (TYLENOL) tablet 650 mg  650 mg Oral Q6H PRN Clapacs, John T, MD      . alum & mag hydroxide-simeth (MAALOX/MYLANTA) 200-200-20 MG/5ML suspension 30 mL  30 mL Oral Q4H PRN Clapacs, John T, MD      . chlordiazePOXIDE (LIBRIUM) capsule 25 mg  25 mg Oral Q6H PRN McNew, Holly R, MD      . chlordiazePOXIDE (LIBRIUM) capsule 25 mg  25 mg Oral TID Marylin Crosby, MD   25 mg at 10/25/17 0919   Followed by  . [START ON 10/26/2017] chlordiazePOXIDE (LIBRIUM) capsule 25 mg  25 mg Oral BID McNew, Tyson Babinski, MD       Followed by  . [START ON 10/27/2017] chlordiazePOXIDE (LIBRIUM) capsule 25 mg  25 mg Oral Daily McNew, Holly R, MD      . feeding supplement (ENSURE ENLIVE) (ENSURE ENLIVE) liquid 237 mL  237 mL Oral TID BM McNew, Holly R, MD      . FLUoxetine (PROZAC) capsule 20 mg  20 mg Oral Daily Clapacs, Madie Reno, MD   20 mg at 10/25/17 0919  . folic acid (FOLVITE) tablet 1 mg  1 mg Oral Daily Clapacs, Madie Reno, MD   1 mg at 10/25/17 0920  . gabapentin (NEURONTIN) capsule 300 mg  300 mg Oral TID Clapacs, Madie Reno, MD   300 mg at 10/25/17 2778  . hydrOXYzine (ATARAX/VISTARIL) tablet 25 mg  25 mg Oral Q6H PRN McNew, Tyson Babinski, MD      . levothyroxine (SYNTHROID, LEVOTHROID) tablet 25 mcg  25 mcg Oral QAC breakfast Clapacs, Madie Reno, MD   25 mcg at 10/25/17 0920  . lisinopril (PRINIVIL,ZESTRIL) tablet 10 mg  10 mg Oral Daily Clapacs, Madie Reno, MD   10 mg at 10/25/17 0920  . loperamide  (IMODIUM) capsule 2-4 mg  2-4 mg Oral PRN McNew, Tyson Babinski, MD      . magnesium hydroxide (MILK OF MAGNESIA) suspension 30 mL  30 mL Oral Daily PRN Clapacs, John T, MD      . multivitamin with minerals tablet 1 tablet  1 tablet Oral Daily McNew, Tyson Babinski, MD   1 tablet at 10/25/17 0927  . nicotine (NICODERM CQ - dosed in mg/24 hours) patch 21 mg  21 mg Transdermal Daily Clapacs, Madie Reno, MD   21 mg at 10/25/17 0923  . ondansetron (ZOFRAN-ODT) disintegrating tablet 4 mg  4 mg Oral Q6H PRN McNew, Tyson Babinski, MD      . phenytoin (DILANTIN) chewable tablet 100 mg  100 mg Oral TID Clapacs, Madie Reno, MD   100 mg at 10/25/17 0921  . QUEtiapine (SEROQUEL) tablet 200 mg  200 mg Oral QHS McNew, Holly R, MD      . thiamine (B-1) injection 100 mg  100 mg Intramuscular Once McNew, Tyson Babinski, MD      . Derrill Memo ON 10/26/2017] thiamine (VITAMIN B-1) tablet 100 mg  100 mg Oral Daily McNew, Tyson Babinski, MD       PTA Medications: Medications Prior to Admission  Medication Sig Dispense  Refill Last Dose  . FLUoxetine (PROZAC) 20 MG capsule Take 1 capsule (20 mg total) by mouth daily. 30 capsule 0 unknown at unknown  . folic acid (FOLVITE) 1 MG tablet Take 1 tablet (1 mg total) by mouth daily. 30 tablet 0 unknown at unknown  . gabapentin (NEURONTIN) 300 MG capsule Take 1 capsule (300 mg total) by mouth 3 (three) times daily. 90 capsule 0 unknown at unknown  . levothyroxine (SYNTHROID, LEVOTHROID) 25 MCG tablet Take 1 tablet (25 mcg total) by mouth daily before breakfast.   unknown at unknown  . lisinopril (PRINIVIL,ZESTRIL) 10 MG tablet Take 1 tablet (10 mg total) by mouth daily. For high blood pressure 30 tablet 0 unknown at unknown  . phenytoin (DILANTIN) 100 MG ER capsule Take 1 capsule (100 mg total) by mouth 3 (three) times daily. 90 capsule 1 unknown at unknown  . QUEtiapine (SEROQUEL) 50 MG tablet Take 3 tablets (150 mg total) by mouth at bedtime.   unknown at unknown  . thiamine 100 MG tablet Take 1 tablet (100 mg total) by  mouth daily. 30 tablet 0 unknown at unknown    Patient Stressors: Financial difficulties Substance abuse  Patient Strengths: Ability for insight Active sense of humor  Treatment Modalities: Medication Management, Group therapy, Case management,  1 to 1 session with clinician, Psychoeducation, Recreational therapy.   Physician Treatment Plan for Primary Diagnosis: Severe recurrent major depression without psychotic features ([place]) Long Term Goal(s): Improvement in symptoms so as ready for discharge   Short Term Goals: Ability to disclose and discuss suicidal ideas  Medication Management: Evaluate patient's response, side effects, and tolerance of medication regimen.  Therapeutic Interventions: 1 to 1 sessions, Unit Group sessions and Medication administration.  Evaluation of Outcomes: Not Met  Physician Treatment Plan for Secondary Diagnosis: Principal Problem:   Severe recurrent major depression without psychotic features ([place]) Active Problems:   Alcohol abuse with alcohol-induced mood disorder ([place])  Long Term Goal(s): Improvement in symptoms so as ready for discharge   Short Term Goals: Ability to disclose and discuss suicidal ideas     Medication Management: Evaluate patient's response, side effects, and tolerance of medication regimen.  Therapeutic Interventions: 1 to 1 sessions, Unit Group sessions and Medication administration.  Evaluation of Outcomes: Not Met   RN Treatment Plan for Primary Diagnosis: Severe recurrent major depression without psychotic features ([place]) Long Term Goal(s): Knowledge of disease and therapeutic regimen to maintain health will improve  Short Term Goals: Ability to identify and develop effective coping behaviors will improve and Compliance with prescribed medications will improve  Medication Management: RN will administer medications as ordered by provider, will assess and evaluate patient's response and provide education to patient for  prescribed medication. RN will report any adverse and/or side effects to prescribing provider.  Therapeutic Interventions: 1 on 1 counseling sessions, Psychoeducation, Medication administration, Evaluate responses to treatment, Monitor vital signs and CBGs as ordered, Perform/monitor CIWA, COWS, AIMS and Fall Risk screenings as ordered, Perform wound care treatments as ordered.  Evaluation of Outcomes: Not Met   LCSW Treatment Plan for Primary Diagnosis: Severe recurrent major depression without psychotic features ([place]) Long Term Goal(s): Safe transition to appropriate next level of care at discharge, Engage patient in therapeutic group addressing interpersonal concerns.  Short Term Goals: Engage patient in aftercare planning with referrals and resources, Increase social support and Increase skills for wellness and recovery  Therapeutic Interventions: Assess for all discharge needs, 1 to 1 time with Social worker, Explore available  resources and support systems, Assess for adequacy in community support network, Educate family and significant other(s) on suicide prevention, Complete Psychosocial Assessment, Interpersonal group therapy.  Evaluation of Outcomes: Not Met   Progress in Treatment: Attending groups: No. Participating in groups: No. Taking medication as prescribed: Yes. Toleration medication: Yes. Family/Significant other contact made: No, will contact:  when given permission Patient understands diagnosis: Yes. Discussing patient identified problems/goals with staff: Yes. Medical problems stabilized or resolved: Yes. Denies suicidal/homicidal ideation: Yes. Issues/concerns per patient self-inventory: No. Other: none  New problem(s) identified: No, Describe:  none  New Short Term/Long Term Goal(s):  Patient Goals:  "get into a better treatment program."  Discharge Plan or Barriers:   Reason for Continuation of Hospitalization: Depression Medication  stabilization  Estimated Length of Stay: 3-5 days.   Recreational Therapy: Patient Stressors: N/A Patient Goal: Patient will engage in groups without prompting or encouragement from LRT x3 group sessions within 5 recreation therapy group sessions  Attendees: Patient: Rodney Watson 10/25/2017   Physician: Dr. Bary Leriche, MD 10/25/2017   Nursing: Tyler Pita, RN 10/25/2017   RN Care Manager: 10/25/2017   Social Worker: Lurline Idol, LCSW 10/25/2017   Recreational Therapist: Isaias Sakai. Syrus Nakama CTRS, LRT 10/25/2017   Other: Derrek Gu, LCSW 10/25/2017   Other: Marney Doctor, Chaplain 10/25/2017   Other: 10/25/2017        Scribe for Treatment Team: Joanne Chars, [place] 10/25/2017 2:35 PM

## 2017-10-25 NOTE — Progress Notes (Addendum)
Patient was active in the milieu, pleasant and cooperative. Participated in evening activities. Denying suicidal thoughts. Patient has no concern so far. Received HS medications and was encouraged to talk to staff as needed. Currently in bed and staff continue to monitor.  0600: Patient slept all night and was comfortable in bed. Got up in the morning for vital signs and had no concerns. Currently in the dayroom, active and cooperative. Staff continue to provide support and encouragements.

## 2017-10-25 NOTE — Progress Notes (Signed)
   10/25/17 1120  Clinical Encounter Type  Visited With Patient  Visit Type Initial;Spiritual support;Behavioral Health  Referral From Social work  Consult/Referral To Chaplain  Spiritual Encounters  Spiritual Needs Emotional;Other (Comment)   CH attended the patient's treatment team meeting. Rodney Watson was very reserved and appeared to be depressed. Patient stated that "to get better" as his main goal. I will follow up if needed.

## 2017-10-25 NOTE — [facility] Group Notes (Signed)
[facility] LCSW Group Therapy Note  Date/Time: 10/25/17, 1300  Type of Therapy and Topic:  Group Therapy:  Feelings around Relapse and Recovery  Participation Level:  Minimal   Mood:  Description of Group:    Patients in this group will discuss emotions they experience before and after a relapse. They will process how experiencing these feelings, or avoidance of experiencing them, relates to having a relapse. Facilitator will guide patients to explore emotions they have related to recovery. Patients will be encouraged to process which emotions are more powerful. They will be guided to discuss the emotional reaction significant others in their lives may have to patients' relapse or recovery. Patients will be assisted in exploring ways to respond to the emotions of others without this contributing to a relapse.  Therapeutic Goals: 1. Patient will identify two or more emotions that lead to relapse for them:  2. Patient will identify two emotions that result when they relapse:  3. Patient will identify two emotions related to recovery:  4. Patient will demonstrate ability to communicate their needs through discussion and/or role plays.   Summary of Patient Progress:Pt came to group, stayed for about half, was looking at a book most of the time, and then left.     Therapeutic Modalities:   Cognitive Behavioral Therapy Solution-Focused Therapy Assertiveness Training Relapse Prevention Therapy  Daleen Squibb, LCSW

## 2017-10-25 NOTE — Progress Notes (Signed)
Initial Nutrition Assessment  DOCUMENTATION CODES:   Not applicable  INTERVENTION:   Ensure Enlive po BID, each supplement provides 350 kcal and 20 grams of protein  MVI, thiamine and folic acid daily in setting of etoh abuse  NUTRITION DIAGNOSIS:   Increased nutrient needs related to social / environmental circumstances(substance abuse, homelessness ) as evidenced by estimated needs.  GOAL:   Patient will meet greater than or equal to 90% of their needs  MONITOR:   PO intake, Supplement acceptance  REASON FOR ASSESSMENT:   Malnutrition Screening Tool    ASSESSMENT:   52 y.o. male with a history of alcohol abuse, depression and seizures who was presented to the emergency department today after an intentional overdose.   Pt is homeless and with h/o substance abuse. RD will send supplements to help pt meet his estimated needs. Pt already ordered for vitamins.   Medications reviewed and include: folic acid, synthroid, MVI, nicotine, thiamine  Labs reviewed:  Diet Order:   Diet Order            Diet regular Room service appropriate? Yes; Fluid consistency: Thin  Diet effective now             EDUCATION NEEDS:   No education needs have been identified at this time  Skin:  Skin Assessment: Reviewed RN Assessment  Last BM:  unknown   Height:   Ht Readings from Last 1 Encounters:  10/24/17 5\' 8"  (1.727 m)    Weight:   Wt Readings from Last 1 Encounters:  10/24/17 58.1 kg    Ideal Body Weight:  70 kg  BMI:  Body mass index is 19.46 kg/m.  Estimated Nutritional Needs:   Kcal:  1700-2000kcal/day   Protein:  75-87g/day   Fluid:  >1.7L/day   Betsey Holiday MS, RD, LDN Pager #- [phone] Office#- [phone] After Hours Pager: [phone]

## 2017-10-25 NOTE — H&P (Signed)
Psychiatric Admission Assessment Adult  Patient Identification: Rodney Watson MRN:  [id number] Date of Evaluation:  10/25/2017 Chief Complaint:  Overdose on Dilantin Principal Diagnosis: Severe recurrent major depression without psychotic features [facility]) Diagnosis:   Patient Active Problem List   Diagnosis Date Noted  . Severe recurrent major depression without psychotic features ([place]) [F33.2] 10/25/2017    Priority: High  . Major depressive disorder, recurrent episode, severe ([place]) [F33.2] 09/29/2017    Priority: High  . Alcohol abuse with alcohol-induced mood disorder ([place]) [F10.14] 04/10/2017    Priority: High  . Suicide attempt ([place]) [T14.91XA] 10/24/2017  . Alcohol withdrawal ([place]) [F10.239] 07/18/2017  . Seizure disorder ([place]) [Z66.063] 07/18/2017  . Essential hypertension [I10] 07/18/2017   History of Present Illness: 52 yo male admitted after he overdosed on Dilantin as a suicide attempt. He is known to his provider from recent admission for SI and alcohol abuse. He was transferred to [facility] from tat hospitalization. He states taht he stayed there 14 days and "I thought I was doing better." HE left and met up with some people on he streets and relapsed on alcohol and crack cocaine. He states that he drank 3 bottles of Listerine to get intoxicated w hich he has done in the past. He also used crack cocaine but insists this was a one time thing. He states that he took 15 tablets of Dilantin. When asked what his intention was, he paused and thought about it. HE states, "I was hoping I wouldn't wake up." He states that he called the ambulance himself. HE was laughing about the circumstances because the EMS saw empty bottles of Listerine. He states that he had other medications but chose Dilantin to take. He is not sure why he didn't take the other medications and chose Dilantin.  He states that he has been staying on the streets which is a big stressor for him. He looked into a boarding room but  they were going to charge him over 600 dollars for the first month and he only gets 700 a month. He states that he spent all of his money this month and won't have any until October. He bought a new phone and staying in a motel for a few days. He also spent it on alcohol.  He has court on September 16h for stealing a bottle of Listerine from [place]. He has a Chief Executive Officer for this. He states that he wants to get sober. And still has the goal to get a place of his own one day. He continues to report SI today.    Associated Signs/Symptoms: Depression Symptoms:  depressed mood, anhedonia, insomnia, fatigue, feelings of worthlessness/guilt, difficulty concentrating, suicidal thoughts with specific plan, (Hypo) Manic Symptoms:  Denies Anxiety Symptoms:  Excessive Worry, Psychotic Symptoms:  Denies PTSD Symptoms: Negative Total Time spent with patient: 45 minutes  Past Psychiatric History: He reports history of PTSD. He does not have any outpatient providers and has long history of noncompliance. He reports one prior suicide attempt by cutting his wrists.   Is the patient at risk to self? Yes.    Has the patient been a risk to self in the past 6 months? Yes.    Has the patient been a risk to self within the distant past? Yes.    Is the patient a risk to others? No.  Has the patient been a risk to others in the past 6 months? No.  Has the patient been a risk to others within the distant past? No.  Alcohol Screening: 1. How often do you have a drink containing alcohol?: 2 to 3 times a week 2. How many drinks containing alcohol do you have on a typical day when you are drinking?: 5 or 6 3. How often do you have six or more drinks on one occasion?: Monthly AUDIT-C Score: 7 4. How often during the last year have you found that you were not able to stop drinking once you had started?: Monthly 5. How often during the last year have you failed to do what was normally expected from you becasue of  drinking?: Monthly 6. How often during the last year have you needed a first drink in the morning to get yourself going after a heavy drinking session?: Monthly 7. How often during the last year have you had a feeling of guilt of remorse after drinking?: Monthly 8. How often during the last year have you been unable to remember what happened the night before because you had been drinking?: Monthly 9. Have you or someone else been injured as a result of your drinking?: No 10. Has a relative or friend or a doctor or another health worker been concerned about your drinking or suggested you cut down?: No Alcohol Use Disorder Identification Test Final Score (AUDIT): 17 Intervention/Follow-up: Alcohol Education, Medication Offered/Prescribed, Continued Monitoring Substance Abuse History in the last 12 months:  Yes.  , daily heavy alcohol use "as much as I can get my hands on.' He also drinks Listerine to get drunk. He states, "It has.9 percent alcohol in it." He has history of 4 DUI's. He has also required medical detox in the past. Recent crack cocaine use.  Consequences of Substance Abuse: Medical Consequences:  Depressino Legal Consequences:  DUIs DT's: yes Previous Psychotropic Medications: Yes  Psychological Evaluations: Yes  Past Medical History:  Past Medical History:  Diagnosis Date  . Alcohol abuse   . Alcohol abuse 04/01/2017  . COPD (chronic obstructive pulmonary disease) ([place])   . Crack cocaine use   . Depression   . ETOH abuse   . Seizures ([place])     Past Surgical History:  Procedure Laterality Date  . BACK SURGERY     Family History:  Family History  Problem Relation Age of Onset  . Alcohol abuse Father   . Seizures Neg Hx    Family Psychiatric  History: Pt not aware of any. He was adopted Tobacco Screening: Have you used any form of tobacco in the last 30 days? (Cigarettes, Smokeless Tobacco, Cigars, and/or Pipes): Yes Tobacco use, Select all that apply: 5 or more  cigarettes per day Are you interested in Tobacco Cessation Medications?: Yes, will notify MD for an order Counseled patient on smoking cessation including recognizing danger situations, developing coping skills and basic information about quitting provided: Refused/Declined practical counseling Social History: Originally from [place]. He moved around a lot and lived in various states including [place], [place], [place], [place]. He has been homeless for a long time. He denies having any support system. Not married and no kids. He used to work as a Curator. He gets disability $771 a month. He went to 9th grade and no GED.   Additional Social History:      Pain Medications: SEE MAR Prescriptions: SEE MAR Over the Counter: SEE MAR History of alcohol / drug use?: Yes Longest period of sobriety (when/how long): unknown Negative Consequences of Use: Personal relationships, Financial, Legal Withdrawal Symptoms: Agitation, Weakness, Tremors   Name of Substance 2: ETOH 2 - Age  of First Use: 13 2 - Amount (size/oz): unable to quantify 2 - Frequency: at least twice per month 2 - Duration: unable to quantify 2 - Last Use / Amount: unable to quantify                Allergies:   Allergies  Allergen Reactions  . Chocolate Hives  . Vicodin [Hydrocodone-Acetaminophen] Nausea And Vomiting   Lab Results:  Results for orders placed or performed during the hospital encounter of 10/24/17 (from the past 48 hour(s))  Phenytoin level, total     Status: None   Collection Time: 10/24/17  5:15 PM  Result Value Ref Range   Phenytoin Lvl 10.9 10.0 - 20.0 ug/mL    Comment: Performed at [facility], [address] [place],  [address]    Blood Alcohol level:  Lab Results  Component Value Date   ETH 259 (H) 10/23/2017   ETH 157 (H) 11/91/4782    Metabolic Disorder Labs:  No results found for: HGBA1C, MPG No results found for: PROLACTIN No results found for: CHOL, TRIG, HDL,  CHOLHDL, VLDL, LDLCALC  Current Medications: Current Facility-Administered Medications  Medication Dose Route Frequency Provider Last Rate Last Dose  . acetaminophen (TYLENOL) tablet 650 mg  650 mg Oral Q6H PRN Clapacs, John T, MD      . alum & mag hydroxide-simeth (MAALOX/MYLANTA) 200-200-20 MG/5ML suspension 30 mL  30 mL Oral Q4H PRN Clapacs, John T, MD      . chlordiazePOXIDE (LIBRIUM) capsule 25 mg  25 mg Oral Q6H PRN Aboubacar Matsuo R, MD      . chlordiazePOXIDE (LIBRIUM) capsule 25 mg  25 mg Oral TID Marylin Crosby, MD   25 mg at 10/25/17 0919   Followed by  . [START ON 10/26/2017] chlordiazePOXIDE (LIBRIUM) capsule 25 mg  25 mg Oral BID Jaimy Kliethermes, Tyson Babinski, MD       Followed by  . [START ON 10/27/2017] chlordiazePOXIDE (LIBRIUM) capsule 25 mg  25 mg Oral Daily Neeley Sedivy R, MD      . feeding supplement (ENSURE ENLIVE) (ENSURE ENLIVE) liquid 237 mL  237 mL Oral TID BM Nial Hawe R, MD      . FLUoxetine (PROZAC) capsule 20 mg  20 mg Oral Daily Clapacs, Madie Reno, MD   20 mg at 10/25/17 0919  . folic acid (FOLVITE) tablet 1 mg  1 mg Oral Daily Clapacs, Madie Reno, MD   1 mg at 10/25/17 0920  . gabapentin (NEURONTIN) capsule 300 mg  300 mg Oral TID Clapacs, Madie Reno, MD   300 mg at 10/25/17 9562  . hydrOXYzine (ATARAX/VISTARIL) tablet 25 mg  25 mg Oral Q6H PRN Jeslin Bazinet, Tyson Babinski, MD      . levothyroxine (SYNTHROID, LEVOTHROID) tablet 25 mcg  25 mcg Oral QAC breakfast Clapacs, Madie Reno, MD   25 mcg at 10/25/17 0920  . lisinopril (PRINIVIL,ZESTRIL) tablet 10 mg  10 mg Oral Daily Clapacs, Madie Reno, MD   10 mg at 10/25/17 0920  . loperamide (IMODIUM) capsule 2-4 mg  2-4 mg Oral PRN Oakes Mccready, Tyson Babinski, MD      . magnesium hydroxide (MILK OF MAGNESIA) suspension 30 mL  30 mL Oral Daily PRN Clapacs, John T, MD      . multivitamin with minerals tablet 1 tablet  1 tablet Oral Daily Chancelor Hardrick, Tyson Babinski, MD   1 tablet at 10/25/17 0927  . nicotine (NICODERM CQ - dosed in mg/24 hours) patch 21 mg  21 mg  Transdermal Daily Clapacs, Madie Reno, MD   21 mg at 10/25/17 0923  . ondansetron (ZOFRAN-ODT) disintegrating tablet 4 mg  4 mg Oral Q6H PRN Mahima Hottle, Tyson Babinski, MD      . phenytoin (DILANTIN) chewable tablet 100 mg  100 mg Oral TID Clapacs, Madie Reno, MD   100 mg at 10/25/17 0921  . QUEtiapine (SEROQUEL) tablet 150 mg  150 mg Oral QHS Clapacs, Madie Reno, MD   150 mg at 10/24/17 2121  . thiamine (B-1) injection 100 mg  100 mg Intramuscular Once Aquila Delaughter, Tyson Babinski, MD      . Derrill Memo ON 10/26/2017] thiamine (VITAMIN B-1) tablet 100 mg  100 mg Oral Daily Wilber Fini, Tyson Babinski, MD       PTA Medications: Medications Prior to Admission  Medication Sig Dispense Refill Last Dose  . FLUoxetine (PROZAC) 20 MG capsule Take 1 capsule (20 mg total) by mouth daily. 30 capsule 0 unknown at unknown  . folic acid (FOLVITE) 1 MG tablet Take 1 tablet (1 mg total) by mouth daily. 30 tablet 0 unknown at unknown  . gabapentin (NEURONTIN) 300 MG capsule Take 1 capsule (300 mg total) by mouth 3 (three) times daily. 90 capsule 0 unknown at unknown  . levothyroxine (SYNTHROID, LEVOTHROID) 25 MCG tablet Take 1 tablet (25 mcg total) by mouth daily before breakfast.   unknown at unknown  . lisinopril (PRINIVIL,ZESTRIL) 10 MG tablet Take 1 tablet (10 mg total) by mouth daily. For high blood pressure 30 tablet 0 unknown at unknown  . phenytoin (DILANTIN) 100 MG ER capsule Take 1 capsule (100 mg total) by mouth 3 (three) times daily. 90 capsule 1 unknown at unknown  . QUEtiapine (SEROQUEL) 50 MG tablet Take 3 tablets (150 mg total) by mouth at bedtime.   unknown at unknown  . thiamine 100 MG tablet Take 1 tablet (100 mg total) by mouth daily. 30 tablet 0 unknown at unknown    Musculoskeletal: Strength & Muscle Tone: within normal limits Gait & Station: normal Patient leans: N/A  Psychiatric Specialty Exam: Physical Exam  Nursing note and vitals reviewed.   Review of Systems  All other systems reviewed and are negative.   Blood pressure (!) 166/89, pulse 70, temperature 98.9  F (37.2 C), temperature source Oral, resp. rate 20, height 5' 8"  (1.727 m), weight 58.1 kg, SpO2 100 %.Body mass index is 19.46 kg/m.  General Appearance: Casual  Eye Contact:  Good  Speech:  Clear and Coherent  Volume:  Normal  Mood:  Depressed  Affect:  Congruent  Thought Process:  Coherent and Goal Directed  Orientation:  Full (Time, Place, and Person)  Thought Content:  Logical  Suicidal Thoughts:  Yes.  with intent/plan  Homicidal Thoughts:  No  Memory:  Immediate;   Fair  Judgement:  Fair  Insight:  Fair  Psychomotor Activity:  Normal  Concentration:  Concentration: Fair  Recall:  [employer] of Knowledge:  Fair  Language:  Fair  Akathisia:  No      Assets:  Resilience  ADL's:  Intact  Cognition:  WNL  Sleep:  Number of Hours: 7    Treatment Plan Summary: 52 yo male admitted due to alleged overdose on Dilantin (although Dilantin level never increased). He was recently admitted here and discharge to [facility] where he spent 2 weeks. After discharge from there, he relapsed on alcohol and crack. He has long history of homelessness which is one of his main stressors. HE does get disability income  but spends his money on alcohol. He is now out of money for the month.He would like to restart medications he was on at recent admission.   Plan:  MDD -Restart Prozac 20 mg daily -Restart Seroquel and increase to 200 mg qhs -QTc 481  History of epilepsy -Dilantin level within normal range, 10.9 yesterday -Continue Dilantin 100 mg TID  Alcohol use disorder, w/d -He has only been out of [facility] 3-days so not likely to go into full blown withdrawal -will schedule 3 day Librium taper and CIWA. He is not have any symptosm of w/d currently -Gabapentin 300 mg TID -Possible referral to [facility]  Hypothyroid -he was found to be hypothyroid at last hospitalization -Restart Synthroid at 25 mcg and recheck TSH  Dispo -Pt is homeless. CSW to [facility] into [facility]    Observation Level/Precautions:  15 minute checks  Laboratory:  TSH  Psychotherapy:    Medications:    Consultations:    Discharge Concerns:    Estimated LOS: 5-7 days  Other:     Physician Treatment Plan for Primary Diagnosis: Severe recurrent major depression without psychotic features ([place]) Long Term Goal(s): Improvement in symptoms so as ready for discharge  Short Term Goals: Ability to disclose and discuss suicidal ideas   I certify that inpatient services furnished can reasonably be expected to improve the patient's condition.    Marylin Crosby, MD 9/6/20192:04 PM
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
## 2017-10-25 NOTE — Progress Notes (Signed)
Recreation Therapy Notes  Date: 10/25/2017  Time: 9:30 am  Location: Craft Room  Behavioral response: Appropriate    Intervention Topic: Happiness  Discussion/Intervention:  Group content today was focused on Happiness. The group defined happiness and stated reasons they are and are not happy at times. Participants identified reasons they are normally happy and why. Individuals expressed how not being happy affects themselves and others. Patients stated reasons why happiness is important to them. The group described how they feel when they are happy. Individuals participated in the intervention "What is happiness" where they defined what happiness means to them.   Clinical Observations/Feedback:  Patient came to group but did not provide any contribution towards the group. He left group early due to unknown reasons and never returned.  Luda Charbonneau LRT/CTRS          Rodney Watson 10/25/2017 1:43 PM

## 2017-10-25 NOTE — Plan of Care (Signed)
Pleasant and cooperative.

## 2017-10-25 NOTE — Progress Notes (Signed)
Recreation Therapy Notes  INPATIENT RECREATION THERAPY ASSESSMENT  Patient Details Name: Rodney Watson MRN: [id number] DOB: 09-25-1965 Today's Date: 10/25/2017       Information Obtained From: (Patient refused)  Able to Participate in Assessment/Interview:    Patient Presentation:    Reason for Admission (Per Patient):    Patient Stressors:    Coping Skills:      Leisure Interests (2+):     Frequency of Recreation/Participation:    Awareness of Community Resources:     [employer]:     Current Use:    If no, Barriers?:    Expressed Interest in [employer] Information:    [place] of Residence:     Patient Main Form of Transportation:    Patient Strengths:     Patient Identified Areas of Improvement:     Patient Goal for Hospitalization:     Current SI (including self-harm):     Current HI:     Current AVH:    Staff Intervention Plan:    Consent to Intern Participation:    Rodney Watson 10/25/2017, 3:48 PM

## 2017-10-25 NOTE — Plan of Care (Addendum)
Patient is alert and oriented; patient expresses passive SI, but denies HI and AVH. Patient complains of anxiety. Today patient is pleasant and silly, laughing and joking with RN; attending groups; cooperative with medications; showing interest in activities. Patient complains of being restless last night with off and on sleep. Patient is eating meals; no complaints of BM problems. Patient rates pain 0/10.  Problem: Education: Goal: Utilization of techniques to improve thought processes will improve Outcome: Progressing Goal: Knowledge of the prescribed therapeutic regimen will improve Outcome: Progressing   Problem: Coping: Goal: Coping ability will improve Outcome: Progressing Goal: Will verbalize feelings Outcome: Progressing

## 2017-10-26 DIAGNOSIS — F332 Major depressive disorder, recurrent severe without psychotic features: Principal | ICD-10-CM

## 2017-10-26 NOTE — Progress Notes (Signed)
[facility] MD Progress Note  10/26/2017 5:24 PM Rodney Watson  MRN:  [id number] Subjective: Follow-up for this patient with substance abuse and depression problems.  Patient says he is not feeling any symptoms of alcohol withdrawal today.  No visual hallucinations no tremor.  His mood remains down and negative but without active suicidal ideation.  Patient is withdrawn and staying in bed but taking care of his hygiene well. Principal Problem: Severe recurrent major depression without psychotic features (HCC) Diagnosis:   Patient Active Problem List   Diagnosis Date Noted  . Severe recurrent major depression without psychotic features (HCC) [F33.2] 10/25/2017  . Suicide attempt (HCC) [T14.91XA] 10/24/2017  . Major depressive disorder, recurrent episode, severe (HCC) [F33.2] 09/29/2017  . Alcohol withdrawal (HCC) [F10.239] 07/18/2017  . Seizure disorder (HCC) [G40.909] 07/18/2017  . Essential hypertension [I10] 07/18/2017  . Alcohol abuse with alcohol-induced mood disorder (HCC) [F10.14] 04/10/2017   Total Time spent with patient: 20 minutes  Past Psychiatric History: History of depression and substance abuse without any clear ability to stay sober or follow up with appropriate treatment so far  Past Medical History:  Past Medical History:  Diagnosis Date  . Alcohol abuse   . Alcohol abuse 04/01/2017  . COPD (chronic obstructive pulmonary disease) (HCC)   . Crack cocaine use   . Depression   . ETOH abuse   . Seizures (HCC)     Past Surgical History:  Procedure Laterality Date  . BACK SURGERY     Family History:  Family History  Problem Relation Age of Onset  . Alcohol abuse Father   . Seizures Neg Hx    Family Psychiatric  History: See previous note Social History:  Social History   Substance and Sexual Activity  Alcohol Use Yes   Comment: 5th daily     Social History   Substance and Sexual Activity  Drug Use Yes  . Types: Marijuana, Cocaine   Comment: 1-2 joints/week     Social History   Socioeconomic History  . Marital status: Single    Spouse name: Not on file  . Number of children: Not on file  . Years of education: Not on file  . Highest education level: Not on file  Occupational History  . Not on file  Social Needs  . Financial resource strain: Not on file  . Food insecurity:    Worry: Not on file    Inability: Not on file  . Transportation needs:    Medical: Not on file    Non-medical: Not on file  Tobacco Use  . Smoking status: Current Every Day Smoker    Packs/day: 1.50    Years: 39.00    Pack years: 58.50    Types: Cigarettes  . Smokeless tobacco: Current User    Types: Chew  Substance and Sexual Activity  . Alcohol use: Yes    Comment: 5th daily  . Drug use: Yes    Types: Marijuana, Cocaine    Comment: 1-2 joints/week  . Sexual activity: Never  Lifestyle  . Physical activity:    Days per week: Not on file    Minutes per session: Not on file  . Stress: Not on file  Relationships  . Social connections:    Talks on phone: Not on file    Gets together: Not on file    Attends religious service: Not on file    Active member of club or organization: Not on file    Attends meetings of clubs or organizations:  Not on file    Relationship status: Not on file  Other Topics Concern  . Not on file  Social History Narrative   ** Merged History Encounter **       Additional Social History:    Pain Medications: SEE MAR Prescriptions: SEE MAR Over the Counter: SEE MAR History of alcohol / drug use?: Yes Longest period of sobriety (when/how long): unknown Negative Consequences of Use: Personal relationships, Financial, Legal Withdrawal Symptoms: Agitation, Weakness, Tremors   Name of Substance 2: ETOH 2 - Age of First Use: 13 2 - Amount (size/oz): unable to quantify 2 - Frequency: at least twice per month 2 - Duration: unable to quantify 2 - Last Use / Amount: unable to quantify                Sleep:  Fair  Appetite:  Fair  Current Medications: Current Facility-Administered Medications  Medication Dose Route Frequency Provider Last Rate Last Dose  . acetaminophen (TYLENOL) tablet 650 mg  650 mg Oral Q6H PRN Yer Olivencia T, MD      . alum & mag hydroxide-simeth (MAALOX/MYLANTA) 200-200-20 MG/5ML suspension 30 mL  30 mL Oral Q4H PRN Tywana Robotham T, MD      . chlordiazePOXIDE (LIBRIUM) capsule 25 mg  25 mg Oral Q6H PRN McNew, Holly R, MD      . chlordiazePOXIDE (LIBRIUM) capsule 25 mg  25 mg Oral BID Haskell Riling, MD   25 mg at 10/26/17 0849   Followed by  . [START ON 10/27/2017] chlordiazePOXIDE (LIBRIUM) capsule 25 mg  25 mg Oral Daily McNew, Holly R, MD      . feeding supplement (ENSURE ENLIVE) (ENSURE ENLIVE) liquid 237 mL  237 mL Oral TID BM McNew, Ileene Hutchinson, MD   237 mL at 10/26/17 1511  . FLUoxetine (PROZAC) capsule 20 mg  20 mg Oral Daily Sadao Weyer, Jackquline Denmark, MD   20 mg at 10/26/17 0848  . folic acid (FOLVITE) tablet 1 mg  1 mg Oral Daily Katoya Amato, Jackquline Denmark, MD   1 mg at 10/26/17 0848  . gabapentin (NEURONTIN) capsule 300 mg  300 mg Oral TID Doree Kuehne T, MD   300 mg at 10/26/17 1450  . hydrOXYzine (ATARAX/VISTARIL) tablet 25 mg  25 mg Oral Q6H PRN McNew, Ileene Hutchinson, MD      . levothyroxine (SYNTHROID, LEVOTHROID) tablet 25 mcg  25 mcg Oral QAC breakfast Clyda Smyth, Jackquline Denmark, MD   25 mcg at 10/26/17 0849  . lisinopril (PRINIVIL,ZESTRIL) tablet 10 mg  10 mg Oral Daily Shylo Dillenbeck, Jackquline Denmark, MD   10 mg at 10/26/17 0849  . loperamide (IMODIUM) capsule 2-4 mg  2-4 mg Oral PRN McNew, Ileene Hutchinson, MD      . magnesium hydroxide (MILK OF MAGNESIA) suspension 30 mL  30 mL Oral Daily PRN Codi Kertz T, MD      . multivitamin with minerals tablet 1 tablet  1 tablet Oral Daily McNew, Ileene Hutchinson, MD   1 tablet at 10/26/17 0853  . nicotine (NICODERM CQ - dosed in mg/24 hours) patch 21 mg  21 mg Transdermal Daily Kyland No, Jackquline Denmark, MD   21 mg at 10/26/17 0852  . ondansetron (ZOFRAN-ODT) disintegrating tablet 4 mg  4 mg Oral  Q6H PRN McNew, Ileene Hutchinson, MD      . phenytoin (DILANTIN) chewable tablet 100 mg  100 mg Oral TID Cheyan Frees, Jackquline Denmark, MD   100 mg at 10/26/17 1449  . QUEtiapine (SEROQUEL) tablet 200 mg  200 mg Oral QHS McNew, Ileene Hutchinson, MD   200 mg at 10/25/17 2133  . thiamine (VITAMIN B-1) tablet 100 mg  100 mg Oral Daily McNew, Ileene Hutchinson, MD   100 mg at 10/26/17 1610    Lab Results: No results found for this or any previous visit (from the past 48 hour(s)).  Blood Alcohol level:  Lab Results  Component Value Date   ETH 259 (H) 10/23/2017   ETH 157 (H) 09/27/2017    Metabolic Disorder Labs: No results found for: HGBA1C, MPG No results found for: PROLACTIN No results found for: CHOL, TRIG, HDL, CHOLHDL, VLDL, LDLCALC  Physical Findings: AIMS: Facial and Oral Movements Muscles of Facial Expression: None, normal Lips and Perioral Area: None, normal Jaw: None, normal Tongue: None, normal,Extremity Movements Upper (arms, wrists, hands, fingers): None, normal Lower (legs, knees, ankles, toes): None, normal, Trunk Movements Neck, shoulders, hips: None, normal, Overall Severity Severity of abnormal movements (highest score from questions above): None, normal Incapacitation due to abnormal movements: None, normal Patient's awareness of abnormal movements (rate only patient's report): No Awareness, Dental Status Current problems with teeth and/or dentures?: Yes(dnetal caries, missing teeth) Does patient usually wear dentures?: No  CIWA:  CIWA-Ar Total: 0 COWS:     Musculoskeletal: Strength & Muscle Tone: within normal limits Gait & Station: normal Patient leans: N/A  Psychiatric Specialty Exam: Physical Exam  Nursing note and vitals reviewed. Constitutional: He appears well-developed and well-nourished.  HENT:  Head: Normocephalic and atraumatic.  Eyes: Pupils are equal, round, and reactive to light. Conjunctivae are normal.  Neck: Normal range of motion.  Cardiovascular: Regular rhythm and normal  heart sounds.  Respiratory: Effort normal.  GI: Soft.  Musculoskeletal: Normal range of motion.  Neurological: He is alert.  Skin: Skin is warm and dry.  Psychiatric: His speech is delayed. He is slowed. Cognition and memory are normal. He expresses impulsivity. He exhibits a depressed mood. He expresses no suicidal ideation.    Review of Systems  Constitutional: Negative.   HENT: Negative.   Eyes: Negative.   Respiratory: Negative.   Cardiovascular: Negative.   Gastrointestinal: Negative.   Musculoskeletal: Negative.   Skin: Negative.   Neurological: Negative.   Psychiatric/Behavioral: Positive for depression. Negative for hallucinations, memory loss, substance abuse and suicidal ideas. The patient is nervous/anxious. The patient does not have insomnia.     Blood pressure (!) 132/92, pulse 75, temperature 98.5 F (36.9 C), temperature source Oral, resp. rate 20, height 5\' 8"  (1.727 m), weight 58.1 kg, SpO2 100 %.Body mass index is 19.46 kg/m.  General Appearance: Fairly Groomed  Eye Contact:  Good  Speech:  Slow  Volume:  Decreased  Mood:  Dysphoric  Affect:  Constricted  Thought Process:  Goal Directed  Orientation:  Full (Time, Place, and Person)  Thought Content:  Logical  Suicidal Thoughts:  No  Homicidal Thoughts:  No  Memory:  Immediate;   Fair Recent;   Fair Remote;   Fair  Judgement:  Fair  Insight:  Fair  Psychomotor Activity:  Normal  Concentration:  Concentration: Fair  Recall:  [employer] of Knowledge:  Fair  Language:  Fair  Akathisia:  No  Handed:  Right  AIMS (if indicated):     Assets:  Desire for Improvement Resilience  ADL's:  Intact  Cognition:  WNL  Sleep:  Number of Hours: 6.45     Treatment Plan Summary: Daily contact with patient to assess and evaluate symptoms and progress in treatment, Medication  management and Plan Supportive counseling and therapy and psychoeducation.  Reviewed treatment plan with patient.  Encouraged him to be more  active and attend groups more regularly.  No change to medication.  Detox proceeding without any difficulty.  Mordecai Rasmussen, MD 10/26/2017, 5:24 PM

## 2017-10-26 NOTE — Plan of Care (Signed)
Patient is alert and oriented with passive SI thoughts without a plan. Patient denies HI and AVH. This morning patient was very irritable but did not want to discuss reasons behind irritability. Patient became more pleasant as day progressed. Patient is not attending groups but will go and sit in day rooms with peers to watch television and read newspaper. Patient denies pain 0/10; compliant with medications and eating meals appropriately. Patient complains of having a restless night. There are no self injurious behaviors observed. Problem: Education: Goal: Utilization of techniques to improve thought processes will improve Outcome: Not Progressing Goal: Knowledge of the prescribed therapeutic regimen will improve Outcome: Not Progressing   Problem: Activity: Goal: Interest or engagement in leisure activities will improve Outcome: Not Progressing Goal: Imbalance in normal sleep/wake cycle will improve Outcome: Not Progressing   Problem: Coping: Goal: Coping ability will improve Outcome: Progressing Goal: Will verbalize feelings Outcome: Not Progressing

## 2017-10-26 NOTE — [facility] Group Notes (Signed)
LCSW Group Therapy Note   10/26/2017 1:15pm   Type of Therapy and Topic:  Group Therapy:  Trust and Honesty  Participation Level:  Did Not Attend  Description of Group:    In this group patients will be asked to explore the value of being honest.  Patients will be guided to discuss their thoughts, feelings, and behaviors related to honesty and trusting in others. Patients will process together how trust and honesty relate to forming relationships with peers, family members, and self. Each patient will be challenged to identify and express feelings of being vulnerable. Patients will discuss reasons why people are dishonest and identify alternative outcomes if one was truthful (to self or others). This group will be process-oriented, with patients participating in exploration of their own experiences, giving and receiving support, and processing challenge from other group members.   Therapeutic Goals: 1. Patient will identify why honesty is important to relationships and how honesty overall affects relationships.  2. Patient will identify a situation where they lied or were lied too and the  feelings, thought process, and behaviors surrounding the situation 3. Patient will identify the meaning of being vulnerable, how that feels, and how that correlates to being honest with self and others. 4. Patient will identify situations where they could have told the truth, but instead lied and explain reasons of dishonesty.   Summary of Patient Progress: Pt was invited to attend group but chose not to attend. CSW will continue to encourage pt to attend group throughout their admission.     Therapeutic Modalities:   Cognitive Behavioral Therapy Solution Focused Therapy Motivational Interviewing Brief Therapy  Jie Stickels  CUEBAS-COLON, LCSW 10/26/2017 12:56 PM  

## 2017-10-26 NOTE — Plan of Care (Signed)
Visible in the milieu, pleasant and cooperative

## 2017-10-26 NOTE — [facility] Group Notes (Signed)
[facility] Group Notes:  (Nursing/MHT/Case Management/Adjunct)  Date:  10/26/2017  Time:  1:45 AM  Type of Therapy:  Group Therapy  Participation Level:  Active  Participation Quality:  Appropriate  Affect:  Appropriate  Cognitive:  Appropriate  Insight:  Appropriate  Engagement in Group:  Engaged  Modes of Intervention:  Discussion  Summary of Progress/Problems: [name] attended group. [name] reports high anxiety, depression and hopelessness. [name] stated he was experiencing withdrawal symptoms Diarrhea, cravings, agitation, runny nose, and irritability. [name] stated he needed to leave group on this day because they were talking about drugs and alcohol. [name] stated talking about it was making him want it, so he had to walk out. [name] also reports thoughts of self-harm. [name] reports the frequency is sometimes. [name] reports his goal is to stay calm. [name] feels he can accomplish this goal by staying to himself. MHT reviewed the rules and expectations of the unit. MHT addressed concerns with patients sharing clothes. MHT processed with patients why it was a rule and why it had to be followed. MHT processed with patients about the snack schedule. MHT addressed concerns patients expressed during wrap up. MHT provided patients with self-inventory sheet to complete.  Rodney Watson 10/26/2017, 1:45 AM

## 2017-10-26 NOTE — Progress Notes (Signed)
Patient is currently visible in the milieu. Alert and oriented.ant and cooperative.in the milieu and interacting with staff and peers appropriately. No sign of distress. Received his due medications and support provided. Staff continue to monitor for safety.

## 2017-10-27 NOTE — Progress Notes (Signed)
[facility] MD Progress Note  10/27/2017 11:54 AM Rodney Watson  MRN:  [id number] Subjective: Follow-up patient with depression and alcohol abuse.  Patient says that he is still feeling nervous and depressed much of the time.  Ruminating a lot about his life and feeling very hopeless and negative about it.  Suicidal thoughts without any intention or plan to act on it.  Still stays almost completely in his room not getting out very much.  Eats adequately.  No sign of delirium. Principal Problem: Severe recurrent major depression without psychotic features (HCC) Diagnosis:   Patient Active Problem List   Diagnosis Date Noted  . Severe recurrent major depression without psychotic features (HCC) [F33.2] 10/25/2017  . Suicide attempt (HCC) [T14.91XA] 10/24/2017  . Major depressive disorder, recurrent episode, severe (HCC) [F33.2] 09/29/2017  . Alcohol withdrawal (HCC) [F10.239] 07/18/2017  . Seizure disorder (HCC) [G40.909] 07/18/2017  . Essential hypertension [I10] 07/18/2017  . Alcohol abuse with alcohol-induced mood disorder (HCC) [F10.14] 04/10/2017   Total Time spent with patient: 20 minutes  Past Psychiatric History: Recurrent episodes of depression.  Past Medical History:  Past Medical History:  Diagnosis Date  . Alcohol abuse   . Alcohol abuse 04/01/2017  . COPD (chronic obstructive pulmonary disease) (HCC)   . Crack cocaine use   . Depression   . ETOH abuse   . Seizures (HCC)     Past Surgical History:  Procedure Laterality Date  . BACK SURGERY     Family History:  Family History  Problem Relation Age of Onset  . Alcohol abuse Father   . Seizures Neg Hx    Family Psychiatric  History: See previous note Social History:  Social History   Substance and Sexual Activity  Alcohol Use Yes   Comment: 5th daily     Social History   Substance and Sexual Activity  Drug Use Yes  . Types: Marijuana, Cocaine   Comment: 1-2 joints/week    Social History   Socioeconomic History   . Marital status: Single    Spouse name: Not on file  . Number of children: Not on file  . Years of education: Not on file  . Highest education level: Not on file  Occupational History  . Not on file  Social Needs  . Financial resource strain: Not on file  . Food insecurity:    Worry: Not on file    Inability: Not on file  . Transportation needs:    Medical: Not on file    Non-medical: Not on file  Tobacco Use  . Smoking status: Current Every Day Smoker    Packs/day: 1.50    Years: 39.00    Pack years: 58.50    Types: Cigarettes  . Smokeless tobacco: Current User    Types: Chew  Substance and Sexual Activity  . Alcohol use: Yes    Comment: 5th daily  . Drug use: Yes    Types: Marijuana, Cocaine    Comment: 1-2 joints/week  . Sexual activity: Never  Lifestyle  . Physical activity:    Days per week: Not on file    Minutes per session: Not on file  . Stress: Not on file  Relationships  . Social connections:    Talks on phone: Not on file    Gets together: Not on file    Attends religious service: Not on file    Active member of club or organization: Not on file    Attends meetings of clubs or organizations: Not on file  Relationship status: Not on file  Other Topics Concern  . Not on file  Social History Narrative   ** Merged History Encounter **       Additional Social History:    Pain Medications: SEE MAR Prescriptions: SEE MAR Over the Counter: SEE MAR History of alcohol / drug use?: Yes Longest period of sobriety (when/how long): unknown Negative Consequences of Use: Personal relationships, Financial, Legal Withdrawal Symptoms: Agitation, Weakness, Tremors   Name of Substance 2: ETOH 2 - Age of First Use: 13 2 - Amount (size/oz): unable to quantify 2 - Frequency: at least twice per month 2 - Duration: unable to quantify 2 - Last Use / Amount: unable to quantify                Sleep: Fair  Appetite:  Fair  Current Medications: Current  Facility-Administered Medications  Medication Dose Route Frequency Provider Last Rate Last Dose  . acetaminophen (TYLENOL) tablet 650 mg  650 mg Oral Q6H PRN Abu Heavin T, MD      . alum & mag hydroxide-simeth (MAALOX/MYLANTA) 200-200-20 MG/5ML suspension 30 mL  30 mL Oral Q4H PRN Morna Flud T, MD      . chlordiazePOXIDE (LIBRIUM) capsule 25 mg  25 mg Oral Q6H PRN McNew, Holly R, MD      . feeding supplement (ENSURE ENLIVE) (ENSURE ENLIVE) liquid 237 mL  237 mL Oral TID BM McNew, Ileene Hutchinson, MD   237 mL at 10/26/17 2007  . FLUoxetine (PROZAC) capsule 20 mg  20 mg Oral Daily Chetara Kropp, Jackquline Denmark, MD   20 mg at 10/27/17 0819  . folic acid (FOLVITE) tablet 1 mg  1 mg Oral Daily Elany Felix, Jackquline Denmark, MD   1 mg at 10/27/17 0819  . gabapentin (NEURONTIN) capsule 300 mg  300 mg Oral TID Marino Rogerson, Jackquline Denmark, MD   300 mg at 10/27/17 2355  . hydrOXYzine (ATARAX/VISTARIL) tablet 25 mg  25 mg Oral Q6H PRN McNew, Ileene Hutchinson, MD      . levothyroxine (SYNTHROID, LEVOTHROID) tablet 25 mcg  25 mcg Oral QAC breakfast Jasemine Nawaz, Jackquline Denmark, MD   25 mcg at 10/27/17 [phone]  . lisinopril (PRINIVIL,ZESTRIL) tablet 10 mg  10 mg Oral Daily Marshell Rieger, Jackquline Denmark, MD   10 mg at 10/27/17 0819  . loperamide (IMODIUM) capsule 2-4 mg  2-4 mg Oral PRN McNew, Ileene Hutchinson, MD      . magnesium hydroxide (MILK OF MAGNESIA) suspension 30 mL  30 mL Oral Daily PRN Kaitlinn Iversen T, MD      . multivitamin with minerals tablet 1 tablet  1 tablet Oral Daily McNew, Ileene Hutchinson, MD   1 tablet at 10/27/17 0818  . nicotine (NICODERM CQ - dosed in mg/24 hours) patch 21 mg  21 mg Transdermal Daily Destani Wamser, Jackquline Denmark, MD   21 mg at 10/27/17 0824  . ondansetron (ZOFRAN-ODT) disintegrating tablet 4 mg  4 mg Oral Q6H PRN McNew, Ileene Hutchinson, MD      . phenytoin (DILANTIN) chewable tablet 100 mg  100 mg Oral TID Angeles Zehner, Jackquline Denmark, MD   100 mg at 10/27/17 0819  . QUEtiapine (SEROQUEL) tablet 200 mg  200 mg Oral QHS McNew, Ileene Hutchinson, MD   200 mg at 10/26/17 2120  . thiamine (VITAMIN B-1) tablet 100  mg  100 mg Oral Daily McNew, Ileene Hutchinson, MD   100 mg at 10/27/17 0254    Lab Results: No results found for this or any previous visit (from the  past 48 hour(s)).  Blood Alcohol level:  Lab Results  Component Value Date   ETH 259 (H) 10/23/2017   ETH 157 (H) 09/27/2017    Metabolic Disorder Labs: No results found for: HGBA1C, MPG No results found for: PROLACTIN No results found for: CHOL, TRIG, HDL, CHOLHDL, VLDL, LDLCALC  Physical Findings: AIMS: Facial and Oral Movements Muscles of Facial Expression: None, normal Lips and Perioral Area: None, normal Jaw: None, normal Tongue: None, normal,Extremity Movements Upper (arms, wrists, hands, fingers): None, normal Lower (legs, knees, ankles, toes): None, normal, Trunk Movements Neck, shoulders, hips: None, normal, Overall Severity Severity of abnormal movements (highest score from questions above): None, normal Incapacitation due to abnormal movements: None, normal Patient's awareness of abnormal movements (rate only patient's report): No Awareness, Dental Status Current problems with teeth and/or dentures?: Yes(dnetal caries, missing teeth) Does patient usually wear dentures?: No  CIWA:  CIWA-Ar Total: 0 COWS:     Musculoskeletal: Strength & Muscle Tone: within normal limits Gait & Station: normal Patient leans: N/A  Psychiatric Specialty Exam: Physical Exam  Nursing note and vitals reviewed. Constitutional: He appears well-developed and well-nourished.  HENT:  Head: Normocephalic and atraumatic.  Eyes: Pupils are equal, round, and reactive to light. Conjunctivae are normal.  Neck: Normal range of motion.  Cardiovascular: Regular rhythm and normal heart sounds.  Respiratory: Effort normal. No respiratory distress.  GI: Soft.  Musculoskeletal: Normal range of motion.  Neurological: He is alert.  Skin: Skin is warm and dry.  Psychiatric: His affect is blunt. His speech is delayed. He is slowed. Cognition and memory are  impaired. He expresses impulsivity. He expresses suicidal ideation. He expresses no suicidal plans.    Review of Systems  Constitutional: Negative.   HENT: Negative.   Eyes: Negative.   Respiratory: Negative.   Cardiovascular: Negative.   Gastrointestinal: Negative.   Musculoskeletal: Negative.   Skin: Negative.   Neurological: Negative.   Psychiatric/Behavioral: Positive for depression and suicidal ideas. Negative for hallucinations, memory loss and substance abuse. The patient is nervous/anxious and has insomnia.     Blood pressure 114/83, pulse 78, temperature 98 F (36.7 C), temperature source Oral, resp. rate 18, height 5\' 8"  (1.727 m), weight 58.1 kg, SpO2 100 %.Body mass index is 19.46 kg/m.  General Appearance: Casual  Eye Contact:  Fair  Speech:  Slow  Volume:  Decreased  Mood:  Dysphoric  Affect:  Congruent  Thought Process:  Goal Directed  Orientation:  Full (Time, Place, and Person)  Thought Content:  Logical  Suicidal Thoughts:  No  Homicidal Thoughts:  No  Memory:  Immediate;   Fair Recent;   Fair Remote;   Fair  Judgement:  Impaired  Insight:  Shallow  Psychomotor Activity:  Decreased  Concentration:  Concentration: Fair  Recall:  [employer] of Knowledge:  Fair  Language:  Fair  Akathisia:  No  Handed:  Right  AIMS (if indicated):     Assets:  Desire for Improvement Resilience  ADL's:  Intact  Cognition:  WNL  Sleep:  Number of Hours: 7.15     Treatment Plan Summary: Daily contact with patient to assess and evaluate symptoms and progress in treatment, Medication management and Plan Mood is down and depressed but on the other hand he is showing more willingness to go to substance abuse facilities.  He tells me that [facility] house was discussed with him by the treatment team and he wants to learn more about that because he thinks a longer term  program would be good for him.  I think this is probably a pretty good idea and encouraged him to talk with his  treatment team about it.  No change to medicine for today.  Supportive counseling and review of treatment plan.  Mordecai Rasmussen, MD 10/27/2017, 11:54 AM

## 2017-10-27 NOTE — Plan of Care (Signed)
Patient has the ability to utilize techniques to improve his thought processes. Patient verbalizes understanding of and has been in compliance with his prescribed therapeutic/medication regimen and has not voiced any further questions/concerns at this time. Patient has not attended any unit groups today, however, he has been more visible in the milieu this afternoon watching football with the other members on the unit without any issues. Patient has the ability to make informed decisions regarding his health-care. Patient has demonstrated positive behaviors today and has the ability to identify the support systems that promote safety. Patient denied SI/HI/AVH, but rated his depression and anxiety a "7/10" stating to this writer that "life" is making him depressed, but could not verbalize what is making him anxious. Patient remains safe on the unit at this time.  Problem: Education: Goal: Utilization of techniques to improve thought processes will improve Outcome: Progressing Goal: Knowledge of the prescribed therapeutic regimen will improve Outcome: Progressing   Problem: Activity: Goal: Interest or engagement in leisure activities will improve Outcome: Progressing Goal: Imbalance in normal sleep/wake cycle will improve Outcome: Progressing   Problem: Coping: Goal: Coping ability will improve Outcome: Progressing Goal: Will verbalize feelings Outcome: Progressing   Problem: Health Behavior/Discharge Planning: Goal: Ability to make decisions will improve Outcome: Progressing Goal: Compliance with therapeutic regimen will improve Outcome: Progressing   Problem: Role Relationship: Goal: Will demonstrate positive changes in social behaviors and relationships Outcome: Progressing   Problem: Safety: Goal: Ability to disclose and discuss suicidal ideas will improve Outcome: Progressing Goal: Ability to identify and utilize support systems that promote safety will improve Outcome:  Progressing   Problem: Self-Concept: Goal: Will verbalize positive feelings about self Outcome: Progressing Goal: Level of anxiety will decrease Outcome: Progressing   Problem: Coping: Goal: Ability to identify and develop effective coping behavior will improve Outcome: Progressing

## 2017-10-27 NOTE — Progress Notes (Signed)
D- Patient alert and oriented. Patient presents in a pleasant mood on assessment stating that he slept good last night and didn't want to get up this morning. Patient had no major complaints to voice to this Clinical research associate. Patient rates his depression a "7/10" stating "life" is making him feel this way. Patient rates his anxiety on his self-inventory a "7/10" stating to this writer "I wouldn't know" when asked why he's feeling anxious. Patient denies SI, HI, AVH, and pain at this time. Patient's goal for today is "to get through it", in which he will accomplish this goal by "stay calm".   A- Scheduled medications administered to patient, per MD orders. Support and encouragement provided.  Routine safety checks conducted every 15 minutes.  Patient informed to notify staff with problems or concerns.  R- No adverse drug reactions noted. Patient contracts for safety at this time. Patient compliant with medications and treatment plan. Patient receptive, calm, and cooperative. Patient interacts well with others on the unit.  Patient remains safe at this time.

## 2017-10-27 NOTE — [facility] Group Notes (Signed)
LCSW Group Therapy Note 10/27/2017 1:15pm  Type of Therapy and Topic: Group Therapy: Feelings Around Returning Home & Establishing a Supportive Framework and Supporting Oneself When Supports Not Available  Participation Level: Did Not Attend  Description of Group:  Patients first processed thoughts and feelings about upcoming discharge. These included fears of upcoming changes, lack of change, new living environments, judgements and expectations from others and overall stigma of mental health issues. The group then discussed the definition of a supportive framework, what that looks and feels like, and how do to discern it from an unhealthy non-supportive network. The group identified different types of supports as well as what to do when your family/friends are less than helpful or unavailable  Therapeutic Goals  1. Patient will identify one healthy supportive network that they can use at discharge. 2. Patient will identify one factor of a supportive framework and how to tell it from an unhealthy network. 3. Patient able to identify one coping skill to use when they do not have positive supports from others. 4. Patient will demonstrate ability to communicate their needs through discussion and/or role plays.  Summary of Patient Progress:  Pt was invited to attend group but chose not to attend. CSW will continue to encourage pt to attend group throughout their admission.   Therapeutic Modalities Cognitive Behavioral Therapy Motivational Interviewing   Kevonta Phariss  CUEBAS-COLON, LCSW 10/27/2017 11:02 AM  

## 2017-10-28 MED ORDER — GABAPENTIN 300 MG PO CAPS: 300.0000 mg | ORAL_CAPSULE | Freq: Three times a day (TID) | ORAL | 0 refills | Status: DC

## 2017-10-28 MED ORDER — LEVOTHYROXINE SODIUM 25 MCG PO TABS: 25.0000 ug | ORAL_TABLET | Freq: Every day | ORAL | 0 refills | Status: DC

## 2017-10-28 MED ORDER — PHENYTOIN SODIUM EXTENDED 100 MG PO CAPS: 100.0000 mg | ORAL_CAPSULE | Freq: Three times a day (TID) | ORAL | 0 refills | Status: DC

## 2017-10-28 MED ORDER — LISINOPRIL 10 MG PO TABS: 10.0000 mg | ORAL_TABLET | Freq: Every day | ORAL | 0 refills | Status: DC

## 2017-10-28 MED ORDER — FLUOXETINE HCL 20 MG PO CAPS: 20.0000 mg | ORAL_CAPSULE | Freq: Every day | ORAL | 0 refills | Status: DC

## 2017-10-28 MED ORDER — QUETIAPINE FUMARATE 200 MG PO TABS: 200.0000 mg | ORAL_TABLET | Freq: Every day | ORAL | 0 refills | Status: DC

## 2017-10-28 NOTE — Progress Notes (Signed)
[facility] MD Progress Note  10/28/2017 3:50 PM Rodney Watson  MRN:  [id number] Subjective:  Pt states that he set up an interview with [facility] this afternoon. He states that he has been thinking about it and it sounds like a great place to go. However, he is worried about a court hearing he has next week. He states "I cannot miss that." He states that he would like to get his legal charges dealt with prior to going into treatment. He feels that if he has pending issues going on then he would not be able to focus adequately on treatment. He states that he also needs to meet with his lawyer on Wednesday and really wants to attend this appointment. He has phone number for [facility] and plans to call them after court to try to get into the program. He denies SI or thoughts of self harm. He states that he is motivated to get better this time and "wants to do things right this time." Discussed that i agree with him taht dealing with legal issues first is important. He states that he would like to discharge tomorrow and will stay at a shelter until his legal issues are dealt with. Pt seems to really want to get better and is doing a great job processing through the things he needs to deal with.   Principal Problem: Severe recurrent major depression without psychotic features (HCC) Diagnosis:   Patient Active Problem List   Diagnosis Date Noted  . Severe recurrent major depression without psychotic features (HCC) [F33.2] 10/25/2017    Priority: High  . Major depressive disorder, recurrent episode, severe (HCC) [F33.2] 09/29/2017    Priority: High  . Alcohol abuse with alcohol-induced mood disorder (HCC) [F10.14] 04/10/2017    Priority: High  . Suicide attempt (HCC) [T14.91XA] 10/24/2017  . Alcohol withdrawal (HCC) [F10.239] 07/18/2017  . Seizure disorder (HCC) [G40.909] 07/18/2017  . Essential hypertension [I10] 07/18/2017   Total Time spent with patient: 20 minutes  Past Psychiatric History: See  H&P  Past Medical History:  Past Medical History:  Diagnosis Date  . Alcohol abuse   . Alcohol abuse 04/01/2017  . COPD (chronic obstructive pulmonary disease) (HCC)   . Crack cocaine use   . Depression   . ETOH abuse   . Seizures (HCC)     Past Surgical History:  Procedure Laterality Date  . BACK SURGERY     Family History:  Family History  Problem Relation Age of Onset  . Alcohol abuse Father   . Seizures Neg Hx    Family Psychiatric  History: See H&P Social History:  Social History   Substance and Sexual Activity  Alcohol Use Yes   Comment: 5th daily     Social History   Substance and Sexual Activity  Drug Use Yes  . Types: Marijuana, Cocaine   Comment: 1-2 joints/week    Social History   Socioeconomic History  . Marital status: Single    Spouse name: Not on file  . Number of children: Not on file  . Years of education: Not on file  . Highest education level: Not on file  Occupational History  . Not on file  Social Needs  . Financial resource strain: Not on file  . Food insecurity:    Worry: Not on file    Inability: Not on file  . Transportation needs:    Medical: Not on file    Non-medical: Not on file  Tobacco Use  . Smoking status:  Current Every Day Smoker    Packs/day: 1.50    Years: 39.00    Pack years: 58.50    Types: Cigarettes  . Smokeless tobacco: Current User    Types: Chew  Substance and Sexual Activity  . Alcohol use: Yes    Comment: 5th daily  . Drug use: Yes    Types: Marijuana, Cocaine    Comment: 1-2 joints/week  . Sexual activity: Never  Lifestyle  . Physical activity:    Days per week: Not on file    Minutes per session: Not on file  . Stress: Not on file  Relationships  . Social connections:    Talks on phone: Not on file    Gets together: Not on file    Attends religious service: Not on file    Active member of club or organization: Not on file    Attends meetings of clubs or organizations: Not on file     Relationship status: Not on file  Other Topics Concern  . Not on file  Social History Narrative   ** Merged History Encounter **       Additional Social History:    Pain Medications: SEE MAR Prescriptions: SEE MAR Over the Counter: SEE MAR History of alcohol / drug use?: Yes Longest period of sobriety (when/how long): unknown Negative Consequences of Use: Personal relationships, Financial, Legal Withdrawal Symptoms: Agitation, Weakness, Tremors   Name of Substance 2: ETOH 2 - Age of First Use: 13 2 - Amount (size/oz): unable to quantify 2 - Frequency: at least twice per month 2 - Duration: unable to quantify 2 - Last Use / Amount: unable to quantify                Sleep: Good  Appetite:  Good  Current Medications: Current Facility-Administered Medications  Medication Dose Route Frequency Provider Last Rate Last Dose  . acetaminophen (TYLENOL) tablet 650 mg  650 mg Oral Q6H PRN Clapacs, John T, MD      . alum & mag hydroxide-simeth (MAALOX/MYLANTA) 200-200-20 MG/5ML suspension 30 mL  30 mL Oral Q4H PRN Clapacs, John T, MD      . feeding supplement (ENSURE ENLIVE) (ENSURE ENLIVE) liquid 237 mL  237 mL Oral TID BM Ayslin Kundert, Ileene Hutchinson, MD   237 mL at 10/28/17 1128  . FLUoxetine (PROZAC) capsule 20 mg  20 mg Oral Daily Clapacs, John T, MD   20 mg at 10/28/17 1124  . folic acid (FOLVITE) tablet 1 mg  1 mg Oral Daily Clapacs, John T, MD   1 mg at 10/28/17 1124  . gabapentin (NEURONTIN) capsule 300 mg  300 mg Oral TID Clapacs, Jackquline Denmark, MD   300 mg at 10/28/17 1123  . levothyroxine (SYNTHROID, LEVOTHROID) tablet 25 mcg  25 mcg Oral QAC breakfast Clapacs, Jackquline Denmark, MD   25 mcg at 10/28/17 1123  . lisinopril (PRINIVIL,ZESTRIL) tablet 10 mg  10 mg Oral Daily Clapacs, Jackquline Denmark, MD   10 mg at 10/28/17 1124  . magnesium hydroxide (MILK OF MAGNESIA) suspension 30 mL  30 mL Oral Daily PRN Clapacs, John T, MD      . multivitamin with minerals tablet 1 tablet  1 tablet Oral Daily Davaun Quintela, Ileene Hutchinson, MD    1 tablet at 10/28/17 1123  . nicotine (NICODERM CQ - dosed in mg/24 hours) patch 21 mg  21 mg Transdermal Daily Clapacs, Jackquline Denmark, MD   21 mg at 10/28/17 1127  . phenytoin (DILANTIN) chewable tablet 100 mg  100 mg Oral TID Clapacs, Jackquline Denmark, MD   100 mg at 10/28/17 1124  . QUEtiapine (SEROQUEL) tablet 200 mg  200 mg Oral QHS Parv Manthey, Ileene Hutchinson, MD   200 mg at 10/27/17 2123  . thiamine (VITAMIN B-1) tablet 100 mg  100 mg Oral Daily Maymie Brunke, Ileene Hutchinson, MD   100 mg at 10/28/17 1124    Lab Results: No results found for this or any previous visit (from the past 48 hour(s)).  Blood Alcohol level:  Lab Results  Component Value Date   ETH 259 (H) 10/23/2017   ETH 157 (H) 09/27/2017    Metabolic Disorder Labs: No results found for: HGBA1C, MPG No results found for: PROLACTIN No results found for: CHOL, TRIG, HDL, CHOLHDL, VLDL, LDLCALC  Physical Findings: AIMS: Facial and Oral Movements Muscles of Facial Expression: None, normal Lips and Perioral Area: None, normal Jaw: None, normal Tongue: None, normal,Extremity Movements Upper (arms, wrists, hands, fingers): None, normal Lower (legs, knees, ankles, toes): None, normal, Trunk Movements Neck, shoulders, hips: None, normal, Overall Severity Severity of abnormal movements (highest score from questions above): None, normal Incapacitation due to abnormal movements: None, normal Patient's awareness of abnormal movements (rate only patient's report): No Awareness, Dental Status Current problems with teeth and/or dentures?: Yes(dnetal caries, missing teeth) Does patient usually wear dentures?: No  CIWA:  CIWA-Ar Total: 0 COWS:     Musculoskeletal: Strength & Muscle Tone: within normal limits Gait & Station: normal Patient leans: N/A  Psychiatric Specialty Exam: Physical Exam  Nursing note and vitals reviewed.   Review of Systems  All other systems reviewed and are negative.   Blood pressure 118/86, pulse 74, temperature 98.2 F (36.8 C),  temperature source Oral, resp. rate 18, height 5\' 8"  (1.727 m), weight 58.1 kg, SpO2 99 %.Body mass index is 19.46 kg/m.  General Appearance: Casual  Eye Contact:  Good  Speech:  Clear and Coherent  Volume:  Normal  Mood:  Euthymic  Affect:  Appropriate  Thought Process:  Coherent and Goal Directed  Orientation:  Full (Time, Place, and Person)  Thought Content:  Logical  Suicidal Thoughts:  No  Homicidal Thoughts:  No  Memory:  Immediate;   Fair  Judgement:  Fair  Insight:  Fair  Psychomotor Activity:  Normal  Concentration:  Concentration: Fair  Recall:  [employer] of Knowledge:  Fair  Language:  Fair  Akathisia:  No      Assets:  Resilience  ADL's:  Intact  Cognition:  WNL  Sleep:  Number of Hours: 7     Treatment Plan Summary: 52 yo male admitted due to overdose on Dilantin. He scheduled an interview to get into [facility]. However, he decided that he would like to deal with his legal issues first. He has court next week and wants to meet with his lawyer this Wednesday. He does appear truly motivated to get better but needs to deal with legal stuff first and then pursue [facility]. He denies SI.  Plan:  -Continue Prozac 20 mg daily -Continue Seroquel 200 mg qhs  History of epilepsy -Continue Dilantin 100 mg TID  Alcohol use disorder -He will pursue [facility] once he deals with legal charges -Gabapentin 300 mg TID  Hypothyroid -Synthroid 25 mcg  Dispo He will discharge to shelter likely tomorrow and follow up with [facility]   Haskell Riling, MD 10/28/2017, 3:50 PM

## 2017-10-28 NOTE — Progress Notes (Signed)
Recreation Therapy Notes  Date: 10/28/2017  Time: 9:30 am   Location: Craft room   Behavioral response: N/A   Intervention Topic: Communication  Discussion/Intervention: Patient did not attend group.   Clinical Observations/Feedback:  Patient did not attend group.   Bauer Ausborn LRT/CTRS        Kyndell Zeiser 10/28/2017 10:31 AM

## 2017-10-28 NOTE — Plan of Care (Signed)
Patient is alert and oriented; Patient denies SI, HI and AVH. Patient took morning medications late due to irritability. Patient is bathing and showering. Patient does not attend any groups but does come to the milieu dayroom when groups are over. Patient rates pain 0/10. Patient is eating meals, hygiene has improved. Nurse will continue to monitor. Problem: Education: Goal: Utilization of techniques to improve thought processes will improve Outcome: Progressing Goal: Knowledge of the prescribed therapeutic regimen will improve Outcome: Progressing   Problem: Activity: Goal: Interest or engagement in leisure activities will improve Outcome: Not Progressing Goal: Imbalance in normal sleep/wake cycle will improve Outcome: Progressing   Problem: Coping: Goal: Coping ability will improve Outcome: Progressing Goal: Will verbalize feelings Outcome: Not Progressing   Problem: Health Behavior/Discharge Planning: Goal: Ability to make decisions will improve Outcome: Progressing Goal: Compliance with therapeutic regimen will improve Outcome: Progressing

## 2017-10-28 NOTE — Progress Notes (Signed)
Nursing note 7p-7a  Pt observed interacting with peers on unit this shift. Displayed a flat affect and anxious mood upon interaction with this Clinical research associate. Pt denies pain ,denies SI/HI, and also denies any audio or visual hallucinations at this time. Pt complained of having anxiety regarding discharge and not having anywhere to go. Pt redirected and encouraged to use coping skills to help decrease anxiety. Pt also complained of having a stye forming in his right eye, Upon assessment R eye corner slightly reddened. Pt complained of pain in there area, refused tylenol prn at this time. Pt encouraged to use warm washcloth compress to R eye. Goal: Pt spoke of his upcoming court case and states that he wants to get into a long term facility.That he plans to do the best he can after discharge and attend his appointments. He stated that he wants to go to [facility] house after his appointments are taken care of.. Pt is able to verbally contract for safety with this RN. Pt is now resting in bed with eyes closed, with no signs or symptoms of pain or distress noted. Pt continues to remain safe on the unit and is observed by rounding every 15 min. RN will continue to monitor.

## 2017-10-28 NOTE — Progress Notes (Signed)
Patient remained pleasant and cooperative and had no concerns. Participated in evening activities and maintained a pleasant attitude. Had a snack, received medications then went to bed. Currently sleeping and appears to be comfortable. Staff continue to monitor for safety.

## 2017-10-28 NOTE — Plan of Care (Signed)
Active in the milieu, pleasant and cooperative 

## 2017-10-28 NOTE — Plan of Care (Signed)
  Problem: Education: Goal: Utilization of techniques to improve thought processes will improve Outcome: Progressing Goal: Knowledge of the prescribed therapeutic regimen will improve Outcome: Progressing   Problem: Activity: Goal: Interest or engagement in leisure activities will improve Outcome: Progressing Goal: Imbalance in normal sleep/wake cycle will improve Outcome: Progressing   Problem: Coping: Goal: Coping ability will improve Outcome: Progressing Goal: Will verbalize feelings Outcome: Progressing   Problem: Health Behavior/Discharge Planning: Goal: Ability to make decisions will improve Outcome: Progressing Goal: Compliance with therapeutic regimen will improve Outcome: Progressing   Problem: Role Relationship: Goal: Will demonstrate positive changes in social behaviors and relationships Outcome: Progressing   Problem: Safety: Goal: Ability to disclose and discuss suicidal ideas will improve Outcome: Progressing Goal: Ability to identify and utilize support systems that promote safety will improve Outcome: Progressing   Problem: Self-Concept: Goal: Will verbalize positive feelings about self Outcome: Progressing Goal: Level of anxiety will decrease Outcome: Progressing   Problem: Coping: Goal: Ability to identify and develop effective coping behavior will improve Outcome: Progressing

## 2017-10-29 MED ORDER — LISINOPRIL 10 MG PO TABS: 10.0000 mg | ORAL_TABLET | Freq: Every day | ORAL | 0 refills | Status: DC

## 2017-10-29 MED ORDER — PHENYTOIN SODIUM EXTENDED 100 MG PO CAPS: 100.0000 mg | ORAL_CAPSULE | Freq: Three times a day (TID) | ORAL | 0 refills | Status: DC

## 2017-10-29 MED ORDER — GABAPENTIN 300 MG PO CAPS: 300.0000 mg | ORAL_CAPSULE | Freq: Three times a day (TID) | ORAL | 0 refills | Status: DC

## 2017-10-29 MED ORDER — FLUOXETINE HCL 20 MG PO CAPS: 20.0000 mg | ORAL_CAPSULE | Freq: Every day | ORAL | 0 refills | Status: DC

## 2017-10-29 MED ORDER — QUETIAPINE FUMARATE 200 MG PO TABS: 200.0000 mg | ORAL_TABLET | Freq: Every day | ORAL | 0 refills | Status: DC

## 2017-10-29 MED ORDER — LEVOTHYROXINE SODIUM 25 MCG PO TABS: 25.0000 ug | ORAL_TABLET | Freq: Every day | ORAL | 0 refills | Status: DC

## 2017-10-29 NOTE — BHH Suicide Risk Assessment (Signed)
[facility] Discharge Suicide Risk Assessment   Principal Problem: Severe recurrent major depression without psychotic features [facility]) Discharge Diagnoses:  Patient Active Problem List   Diagnosis Date Noted  . Severe recurrent major depression without psychotic features (HCC) [F33.2] 10/25/2017    Priority: High  . Major depressive disorder, recurrent episode, severe (HCC) [F33.2] 09/29/2017    Priority: High  . Alcohol abuse with alcohol-induced mood disorder (HCC) [F10.14] 04/10/2017    Priority: High  . Suicide attempt (HCC) [T14.91XA] 10/24/2017  . Alcohol withdrawal (HCC) [F10.239] 07/18/2017  . Seizure disorder (HCC) [G40.909] 07/18/2017  . Essential hypertension [I10] 07/18/2017    Mental Status Per Nursing Assessment::   On Admission:  NA  Demographic Factors:  Male, Caucasian, Low socioeconomic status and Unemployed  Loss Factors: Legal issues  Historical Factors: Impulsivity  Risk Reduction Factors:   Positive coping skills or problem solving skills  Continued Clinical Symptoms:  Alcohol/Substance Abuse/Dependencies Previous Psychiatric Diagnoses and Treatments  Cognitive Features That Contribute To Risk:  None    Suicide Risk:  Minimal Acute risk: No identifiable suicidal ideation.  Follow-up Information    [facility] Go on 11/01/2017.   Why:  12:30pm for hospital follow up. Contact information: [address] [place] [address] [phone]        [facility] Follow up.   Why:  Should you end up in [facility] please walk in Monday-Friday between 9am-3pm. Contact information: [address] [place] [address] [phone]          Haskell Riling, MD 10/29/2017, 9:23 AM

## 2017-10-29 NOTE — Progress Notes (Signed)
Patient ID: Rodney Watson, male   DOB: [birth year], 52 y.o.   MRN: [id number]   Discharge Note:  Patient denies SI/HI/AVH at this time. Discharge instructions, AVS, prescriptions, and transition record gone over with patient. Patient agrees to comply with medication management, follow-up visit, and outpatient therapy. Patient belongings returned to patient. Patient questions and concerns addressed and answered. Patient ambulatory off unit. Patient discharged to bus stop via courtesy car.

## 2017-10-29 NOTE — Progress Notes (Signed)
Recreation Therapy Notes  Date: 10/28/2017  Time: 9:30 pm   Location: Craft Room   Behavioral response: N/A   Intervention Topic: Problem Solving  Discussion/Intervention: Patient did not attend group.   Clinical Observations/Feedback:  Patient did not attend group.   Remy Voiles LRT/CTRS        Zuley Lutter 10/29/2017 11:29 AM

## 2017-10-29 NOTE — Discharge Instructions (Signed)
[facility] [phone]

## 2017-10-29 NOTE — Progress Notes (Signed)
Recreation Therapy Notes  INPATIENT RECREATION TR PLAN  Patient Details Name: Rodney Watson MRN: [id number] DOB: 07-10-1965 Today's Date: 10/29/2017  Rec Therapy Plan Is patient appropriate for Therapeutic Recreation?: Yes Treatment times per week: at least 3 Estimated Length of Stay: 5-7 days TR Treatment/Interventions: Group participation (Comment)  Discharge Criteria Pt will be discharged from therapy if:: Discharged Treatment plan/goals/alternatives discussed and agreed upon by:: Patient/family  Discharge Summary Short term goals set: Patient will engage in groups without prompting or encouragement from LRT x3 group sessions within 5 recreation therapy group sessions Short term goals met: Not met Progress toward goals comments: Groups attended Which groups?: Other (Comment)(Happiness) Reason goals not met: Patient spent most of his time in his room Therapeutic equipment acquired: N/A Reason patient discharged from therapy: Discharge from hospital Pt/family agrees with progress & goals achieved: Yes Date patient discharged from therapy: 10/29/17   Kamauri Kathol 10/29/2017, 11:33 AM

## 2017-10-29 NOTE — Discharge Summary (Signed)
Physician Discharge Summary Note  Patient:  Rodney Watson is an 52 y.o., male MRN:  [id number] DOB:  January 21, 1966 Patient phone:  [phone] (home)  Patient address:   [place] [address],  Total Time spent with patient: 20 minutes  Plus 20 minutes of medication reconciliation, discharge planning, and discharge documentation   Date of Admission:  10/24/2017 Date of Discharge: 10/29/17  Reason for Admission:  52 yo male admitted after he overdosed on Dilantin as a suicide attempt. He is known to his provider from recent admission for SI and alcohol abuse. He was transferred to [facility] from tat hospitalization. He states taht he stayed there 14 days and "I thought I was doing better." HE left and met up with some people on he streets and relapsed on alcohol and crack cocaine. He states that he drank 3 bottles of Listerine to get intoxicated w which he has done in the past. He also used crack cocaine but insists this was a one time thing. He states that he took 15 tablets of Dilantin. When asked what his intention was, he paused and thought about it. HE states, "I was hoping I wouldn't wake up." He states that he called the ambulance himself. HE was laughing about the circumstances because the EMS saw empty bottles of Listerine. He states that he had other medications but chose Dilantin to take. He is not sure why he didn't take the other medications and chose Dilantin.  He states that he has been staying on the streets which is a big stressor for him. He looked into a boarding room but they were going to charge him over 600 dollars for the first month and he only gets 700 a month. He states that he spent all of his money this month and won't have any until October. He bought a new phone and staying in a motel for a few days. He also spent it on alcohol.  He has court on September 16h for stealing a bottle of Listerine from [place]. He has a Chief Executive Officer for this. He states that he wants to get sober. And  still has the goal to get a place of his own one day. He continues to report SI today.    Principal Problem: Severe recurrent major depression without psychotic features [facility]) Discharge Diagnoses: Patient Active Problem List   Diagnosis Date Noted  . Severe recurrent major depression without psychotic features ([place]) [F33.2] 10/25/2017    Priority: High  . Major depressive disorder, recurrent episode, severe ([place]) [F33.2] 09/29/2017    Priority: High  . Alcohol abuse with alcohol-induced mood disorder ([place]) [F10.14] 04/10/2017    Priority: High  . Suicide attempt ([place]) [T14.91XA] 10/24/2017  . Alcohol withdrawal ([place]) [F10.239] 07/18/2017  . Seizure disorder ([place]) [P82.423] 07/18/2017  . Essential hypertension [I10] 07/18/2017    Past Psychiatric History: See H&P  Past Medical History:  Past Medical History:  Diagnosis Date  . Alcohol abuse   . Alcohol abuse 04/01/2017  . COPD (chronic obstructive pulmonary disease) ([place])   . Crack cocaine use   . Depression   . ETOH abuse   . Seizures ([place])     Past Surgical History:  Procedure Laterality Date  . BACK SURGERY     Family History:  Family History  Problem Relation Age of Onset  . Alcohol abuse Father   . Seizures Neg Hx    Family Psychiatric  History: See H&P Social History:  Social History   Substance and Sexual Activity  Alcohol Use Yes   Comment: 5th daily     Social History   Substance and Sexual Activity  Drug Use Yes  . Types: Marijuana, Cocaine   Comment: 1-2 joints/week    Social History   Socioeconomic History  . Marital status: Single    Spouse name: Not on file  . Number of children: Not on file  . Years of education: Not on file  . Highest education level: Not on file  Occupational History  . Not on file  Social Needs  . Financial resource strain: Not on file  . Food insecurity:    Worry: Not on file    Inability: Not on file  . Transportation needs:    Medical: Not on file     Non-medical: Not on file  Tobacco Use  . Smoking status: Current Every Day Smoker    Packs/day: 1.50    Years: 39.00    Pack years: 58.50    Types: Cigarettes  . Smokeless tobacco: Current User    Types: Chew  Substance and Sexual Activity  . Alcohol use: Yes    Comment: 5th daily  . Drug use: Yes    Types: Marijuana, Cocaine    Comment: 1-2 joints/week  . Sexual activity: Never  Lifestyle  . Physical activity:    Days per week: Not on file    Minutes per session: Not on file  . Stress: Not on file  Relationships  . Social connections:    Talks on phone: Not on file    Gets together: Not on file    Attends religious service: Not on file    Active member of club or organization: Not on file    Attends meetings of clubs or organizations: Not on file    Relationship status: Not on file  Other Topics Concern  . Not on file  Social History Narrative   ** Merged History Encounter [facility] Course:  Pt was restarted on home medications with no changes. He had bright affect and was social with peers during hospitalization. He was set up with interview with [facility]. However, he has a pending court date next week that he wanted to take care of before proceeding with longer term treatment. He states that he is very motivated to do things right this time and does want to get sober. He states that he wants to take care of his legal stuff prior to getting into treatment because that may hinder his ability to focus on treatment. He consistently denied SI or any thoughts of self harm during hospitalization. He is future oriented and motivated for help. He plans to meet h is lawyer on Wednesday and then his court hearing is on Monday. HE has the number for [facility] and plans to call them after his court hearing. He did call [facility] during hospitalization to let them know he would be contacting them at a future date. He requested discharge and no longer met IVC Criteria.  On day of discharge, he reported feeling anxious about discharge but hopeful for the future. HE is no longer depressed. He denies SI or thoughts of self harm. He has a meeting with his lawyer tomorrow at 5 pm. He states that he will likely stay at a shelter until his court hearing. He was given 7 day supply of medications and will follow up with [facility]>   The patient is at low risk of imminent suicide. Patient  denied thoughts, intent, or plan for harm to self or others, expressed significant future orientation, and expressed an ability to mobilize assistance for hi needs. he is presently void of any contributing psychiatric symptoms, cognitive difficulties, or substance use which would elevate his risk for lethality. Chronic risk for lethality is elevated in light of substance abuse, suicidal gestures to gain admission due to running out of money, homelessness. The chronic risk is presently mitigated by his/her ongoing desire and engagement in [facility] treatment and mobilization of support from family and friends. Chronic risk may elevate if he experiences any significant loss or worsening of symptoms, which can be managed and monitored through outpatient providers. At this time,a cute risk for lethality is low and he is stable for ongoing outpatient management.   Modifiable risk factors were addressed during this hospitalization through appropriate pharmacotherapy and establishment of outpatient follow-up treatment. Some risk factors for suicide are situational (i.e. Unstable housing) or related personality pathology (i.e. Poor coping mechanisms) and thus cannot be further mitigated by continued hospitalization in this setting.    Physical Findings: AIMS: Facial and Oral Movements Muscles of Facial Expression: None, normal Lips and Perioral Area: None, normal Jaw: None, normal Tongue: None, normal,Extremity Movements Upper (arms, wrists, hands, fingers): None, normal Lower (legs, knees, ankles, toes): None,  normal, Trunk Movements Neck, shoulders, hips: None, normal, Overall Severity Severity of abnormal movements (highest score from questions above): None, normal Incapacitation due to abnormal movements: None, normal Patient's awareness of abnormal movements (rate only patient's report): No Awareness, Dental Status Current problems with teeth and/or dentures?: Yes Does patient usually wear dentures?: No  CIWA:  CIWA-Ar Total: 0 COWS:     Musculoskeletal: Strength & Muscle Tone: within normal limits Gait & Station: normal Patient leans: N/A  Psychiatric Specialty Exam: Physical Exam  Nursing note and vitals reviewed.   Review of Systems  All other systems reviewed and are negative.   Blood pressure (!) 123/92, pulse 87, temperature 98.3 F (36.8 C), temperature source Oral, resp. rate 18, height 5' 8"  (1.727 m), weight 58.1 kg, SpO2 100 %.Body mass index is 19.46 kg/m.  General Appearance: Casual  Eye Contact:  Good  Speech:  Clear and Coherent  Volume:  Normal  Mood:  Euthymic  Affect:  Appropriate  Thought Process:  Coherent and Goal Directed  Orientation:  Full (Time, Place, and Person)  Thought Content:  Logical  Suicidal Thoughts:  No  Homicidal Thoughts:  No  Memory:  Immediate;   Fair  Judgement:  Fair  Insight:  Fair  Psychomotor Activity:  Normal  Concentration:  Concentration: Fair  Recall:  [employer] of Knowledge:  Fair  Language:  Fair  Akathisia:  No      Assets:  Resilience  ADL's:  Intact  Cognition:  WNL  Sleep:  Number of Hours: 7     Have you used any form of tobacco in the last 30 days? (Cigarettes, Smokeless Tobacco, Cigars, and/or Pipes): Yes  Has this patient used any form of tobacco in the last 30 days? (Cigarettes, Smokeless Tobacco, Cigars, and/or Pipes) Yes, Yes, A prescription for an FDA-approved tobacco cessation medication was offered at discharge and the patient refused  Blood Alcohol level:  Lab Results  Component Value Date    ETH 259 (H) 10/23/2017   ETH 157 (H) 86/76/7209    Metabolic Disorder Labs:  No results found for: HGBA1C, MPG No results found for: PROLACTIN No results found for: CHOL, TRIG, HDL, CHOLHDL, VLDL,  [facility]  See Psychiatric Specialty Exam and Suicide Risk Assessment completed by Attending Physician prior to discharge.  Discharge destination:  Self care  Is patient on multiple antipsychotic therapies at discharge:  No   Has Patient had three or more failed trials of antipsychotic monotherapy by history:  No  Recommended Plan for Multiple Antipsychotic Therapies: NA  Discharge Instructions    Increase activity slowly   Complete by:  As directed      Allergies as of 10/29/2017      Reactions   Chocolate Hives   Vicodin [hydrocodone-acetaminophen] Nausea And Vomiting      Medication List    STOP taking these medications   folic acid 1 MG tablet Commonly known as:  FOLVITE   thiamine 100 MG tablet     TAKE these medications     Indication  FLUoxetine 20 MG capsule Commonly known as:  PROZAC Take 1 capsule (20 mg total) by mouth daily.  Indication:  Depression   gabapentin 300 MG capsule Commonly known as:  NEURONTIN Take 1 capsule (300 mg total) by mouth 3 (three) times daily.  Indication:  Abuse or Misuse of Alcohol, Alcohol Withdrawal Syndrome, Agitation   levothyroxine 25 MCG tablet Commonly known as:  SYNTHROID, LEVOTHROID Take 1 tablet (25 mcg total) by mouth daily before breakfast.  Indication:  Underactive Thyroid   lisinopril 10 MG tablet Commonly known as:  PRINIVIL,ZESTRIL Take 1 tablet (10 mg total) by mouth daily. For high blood pressure  Indication:  High Blood Pressure Disorder   phenytoin 100 MG ER capsule Commonly known as:  DILANTIN Take 1 capsule (100 mg total) by mouth 3 (three) times daily.  Indication:  Seizure   QUEtiapine 200 MG tablet Commonly known as:  SEROQUEL Take 1 tablet (200 mg total) by mouth at bedtime. What changed:     medication strength  how much to take  Indication:  Major Depressive Disorder      Follow-up Information    [facility] on 11/01/2017.   Why:  12:30pm for hospital follow up. Contact information: [place] [address] [phone]        [facility] Follow up.   Why:  Should you end up in [facility] please walk in Monday-Friday between 9am-3pm. Contact information: [address] [place] [address] [phone]        August Saucer, LCSW .   Specialty:  Licensed Clinical Social Worker       [facility] Follow up.   Why:  Please call to arrange your interview if you decide you want to pursue their program Contact information: [phone]         Signed: Marylin Crosby, MD 10/29/2017, 9:25 AM

## 2017-10-30 ENCOUNTER — Other Ambulatory Visit: Payer: Self-pay

## 2017-10-30 ENCOUNTER — Emergency Department
Admission: EM | Admit: 2017-10-30 | Discharge: 2017-10-30 | Disposition: A | Discharge status: Home / Self Care | Payer: Medicaid Other | Attending: Emergency Medicine | Admitting: Emergency Medicine

## 2017-10-30 ENCOUNTER — Encounter: Payer: Self-pay | Admitting: Emergency Medicine

## 2017-10-30 DIAGNOSIS — F17210 Nicotine dependence, cigarettes, uncomplicated: Secondary | ICD-10-CM | POA: Diagnosis not present

## 2017-10-30 DIAGNOSIS — I10 Essential (primary) hypertension: Secondary | ICD-10-CM | POA: Diagnosis not present

## 2017-10-30 DIAGNOSIS — R569 Unspecified convulsions: Secondary | ICD-10-CM | POA: Diagnosis present

## 2017-10-30 DIAGNOSIS — G40909 Epilepsy, unspecified, not intractable, without status epilepticus: Secondary | ICD-10-CM | POA: Insufficient documentation

## 2017-10-30 DIAGNOSIS — Z79899 Other long term (current) drug therapy: Secondary | ICD-10-CM | POA: Diagnosis not present

## 2017-10-30 DIAGNOSIS — J449 Chronic obstructive pulmonary disease, unspecified: Secondary | ICD-10-CM | POA: Insufficient documentation

## 2017-10-30 LAB — BASIC METABOLIC PANEL
Anion gap: 9 (ref 5–15)
BUN: 26 mg/dL — ABNORMAL HIGH (ref 6–20)
CALCIUM: 9.3 mg/dL (ref 8.9–10.3)
CHLORIDE: 108 mmol/L (ref 98–111)
CO2: 23 mmol/L (ref 22–32)
Creatinine, Ser: 1.02 mg/dL (ref 0.61–1.24)
GFR calc Af Amer: >60 mL/min (ref >60)
GFR calc non Af Amer: >60 mL/min (ref >60)
GLUCOSE: 96 mg/dL (ref 70–99)
Potassium: 4.1 mmol/L (ref 3.5–5.1)
Sodium: 140 mmol/L (ref 135–145)

## 2017-10-30 LAB — URINE DRUG SCREEN, QUALITATIVE (ARMC ONLY)
Amphetamines, Ur Screen: NOT DETECTED
BARBITURATES, UR SCREEN: NOT DETECTED
Benzodiazepine, Ur Scrn: POSITIVE — AB
COCAINE METABOLITE, UR ~~LOC~~: NOT DETECTED
Cannabinoid 50 Ng, Ur ~~LOC~~: NOT DETECTED
MDMA (ECSTASY) UR SCREEN: NOT DETECTED
METHADONE SCREEN, URINE: NOT DETECTED
OPIATE, UR SCREEN: NOT DETECTED
Phencyclidine (PCP) Ur S: NOT DETECTED
Tricyclic, Ur Screen: NOT DETECTED

## 2017-10-30 LAB — PHENYTOIN LEVEL, TOTAL: Phenytoin Lvl: 8.1 ug/mL — ABNORMAL LOW (ref 10.0–20.0)

## 2017-10-30 MED ORDER — SODIUM CHLORIDE 0.9 % IV BOLUS
500.0000 mL | Freq: Once | INTRAVENOUS | Status: AC
  Administered 2017-10-30: 500 mL via INTRAVENOUS

## 2017-10-30 MED ORDER — ONDANSETRON HCL 4 MG/2ML IJ SOLN
INTRAMUSCULAR | Status: AC
  Filled 2017-10-30: qty 2

## 2017-10-30 MED ORDER — ONDANSETRON HCL 4 MG/2ML IJ SOLN
4.0000 mg | Freq: Once | INTRAMUSCULAR | Status: AC
  Administered 2017-10-30: 4 mg via INTRAVENOUS

## 2017-10-30 MED ORDER — METOCLOPRAMIDE HCL 5 MG/ML IJ SOLN
10.0000 mg | Freq: Once | INTRAMUSCULAR | Status: AC
  Administered 2017-10-30: 10 mg via INTRAVENOUS
  Filled 2017-10-30: qty 2

## 2017-10-30 MED ORDER — SODIUM CHLORIDE 0.9 % IV SOLN
1000.0000 mg | Freq: Once | INTRAVENOUS | Status: AC
  Administered 2017-10-30: 1000 mg via INTRAVENOUS
  Filled 2017-10-30: qty 20

## 2017-10-30 NOTE — ED Provider Notes (Signed)
[facility] Emergency Department Provider Note ____________________________________________   First MD Initiated Contact with Patient 10/30/17 0126     (approximate)  I have reviewed the triage vital signs and the nursing notes.   HISTORY  Chief Complaint Seizures    HPI Rodney Watson is a 52 y.o. male with PMH as noted below who presents stating he had a seizure, acute onset this evening, and now resolved.  The patient states that he has been taking his Dilantin but missed his evening dose.  He also reports drinking alcohol (specifically mouthwash today) but denies drug use.  He states he has had and has mild pain to the back of the head but no other injuries.  The patient states that he just wants his Dilantin.  Past Medical History:  Diagnosis Date  . Alcohol abuse   . Alcohol abuse 04/01/2017  . COPD (chronic obstructive pulmonary disease) (HCC)   . Crack cocaine use   . Depression   . ETOH abuse   . Seizures [facility])     Patient Active Problem List   Diagnosis Date Noted  . Severe recurrent major depression without psychotic features (HCC) 10/25/2017  . Suicide attempt ([facility]) 10/24/2017  . Major depressive disorder, recurrent episode, severe (HCC) 09/29/2017  . Alcohol withdrawal (H[facility]) 07/18/2017  . Seizure disorder (HCC) 07/18/2017  . Essential hypertension 07/18/2017  . Alcohol abuse with alcohol-induced mood disorder (HCC) 04/10/2017    Past Surgical History:  Procedure Laterality Date  . BACK SURGERY      Prior to Admission medications   Medication Sig Start Date End Date Taking? Authorizing Provider  FLUoxetine (PROZAC) 20 MG capsule Take 1 capsule (20 mg total) by mouth daily. 10/29/17   McNew, Ileene Hutchinson, MD  gabapentin (NEURONTIN) 300 MG capsule Take 1 capsule (300 mg total) by mouth 3 (three) times daily. 10/29/17   McNew, Ileene Hutchinson, MD  levothyroxine (SYNTHROID, LEVOTHROID) 25 MCG tablet Take 1 tablet (25 mcg total) by mouth daily before  breakfast. 10/29/17   McNew, Ileene Hutchinson, MD  lisinopril (PRINIVIL,ZESTRIL) 10 MG tablet Take 1 tablet (10 mg total) by mouth daily. For high blood pressure 10/29/17   McNew, Ileene Hutchinson, MD  phenytoin (DILANTIN) 100 MG ER capsule Take 1 capsule (100 mg total) by mouth 3 (three) times daily. 10/29/17   McNew, Ileene Hutchinson, MD  QUEtiapine (SEROQUEL) 200 MG tablet Take 1 tablet (200 mg total) by mouth at bedtime. 10/29/17   McNew, Ileene Hutchinson, MD    Allergies Chocolate and Vicodin [hydrocodone-acetaminophen]  Family History  Problem Relation Age of Onset  . Alcohol abuse Father   . Seizures Neg Hx     Social History Social History   Tobacco Use  . Smoking status: Current Every Day Smoker    Packs/day: 1.50    Years: 39.00    Pack years: 58.50    Types: Cigarettes  . Smokeless tobacco: Current User    Types: Chew  Substance Use Topics  . Alcohol use: Yes    Comment: 5th daily  . Drug use: Yes    Types: Marijuana, Cocaine    Comment: 1-2 joints/week    Review of Systems  Constitutional: No fever. Eyes: No redness. ENT: No neck pain. Cardiovascular: Denies chest pain. Respiratory: Denies shortness of breath. Gastrointestinal: No vomiting.  Genitourinary: Negative for flank pain.  Musculoskeletal: Negative for back pain. Skin: Negative for rash. Neurological: Negative for headache.  Positive for seizure.   ____________________________________________   PHYSICAL EXAM:  VITAL SIGNS: ED Triage Vitals  Enc Vitals Group     BP 10/30/17 0058 113/78     Pulse Rate 10/30/17 0058 99     Resp 10/30/17 0058 18     Temp 10/30/17 0058 98.1 F (36.7 C)     Temp Source 10/30/17 0058 Oral     SpO2 10/30/17 0058 95 %     Weight 10/30/17 0059 138 lb (62.6 kg)     Height 10/30/17 0059 5\' 8"  (1.727 m)     Head Circumference --      Peak Flow --      Pain Score 10/30/17 0059 4     Pain Loc --      Pain Edu? --      Excl. in GC? --     Constitutional: Alert and oriented.  Relatively  comfortable appearing and in no acute distress. Eyes: Conjunctivae are normal.  EOMI.  PERRLA. Head: Atraumatic. Nose: No congestion/rhinnorhea. Mouth/Throat: Mucous membranes are moist.   Neck: Normal range of motion.  No midline cervical spinal tenderness. Cardiovascular: Normal rate, regular rhythm. Grossly normal heart sounds.  Good peripheral circulation. Respiratory: Normal respiratory effort.  No retractions. Lungs CTAB. Gastrointestinal: Soft and nontender. No distention.  Genitourinary: No flank tenderness. Musculoskeletal: No lower extremity edema.  Extremities warm and well perfused.  Neurologic:  Normal speech and language.  Motor and sensory intact in all extremities.  Normal coordination.  No gross focal neurologic deficits are appreciated.  Skin:  Skin is warm and dry. No rash noted. Psychiatric: Speech and behavior are normal.  ____________________________________________   LABS (all labs ordered are listed, but only abnormal results are displayed)  Labs Reviewed  BASIC METABOLIC PANEL - Abnormal; Notable for the following components:      Result Value   BUN 26 (*)    All other components within normal limits  PHENYTOIN LEVEL, TOTAL - Abnormal; Notable for the following components:   Phenytoin Lvl 8.1 (*)    All other components within normal limits  URINE DRUG SCREEN, QUALITATIVE ([facility] ONLY) - Abnormal; Notable for the following components:   Benzodiazepine, Ur Scrn POSITIVE (*)    All other components within normal limits  CBG MONITORING, ED   ____________________________________________  EKG  ED ECG REPORT I, Dionne Bucy, the attending physician, personally viewed and interpreted this ECG.  Date: 10/30/2017 EKG Time: 1256 Rate: 101 Rhythm: normal sinus rhythm QRS Axis: normal Intervals: normal ST/T Wave abnormalities: normal Narrative Interpretation: Q waves anteriorly, no evidence of acute  ischemia  ____________________________________________  RADIOLOGY    ____________________________________________   PROCEDURES  Procedure(s) performed: No  Procedures  Critical Care performed: No ____________________________________________   INITIAL IMPRESSION / ASSESSMENT AND PLAN / ED COURSE  Pertinent labs & imaging results that were available during my care of the patient were reviewed by me and considered in my medical decision making (see chart for details).  52 year old male with PMH as noted above including history of seizure disorder and alcohol abuse presents after an apparent seizure.  The patient states that he missed a dose of Dilantin but otherwise has been compliant.  He also acknowledges drinking alcohol but denies drug use.  On exam, the patient is comfortable appearing.  He is alert and oriented x3.  He appears very mildly intoxicated, but is able to answer all my questions appropriately and comply with neuro exam without difficulty.  Overall presentation is consistent with seizure likely due to medication noncompliance.  There is no evidence of  alcohol withdrawal.  There is no evidence of toxic alcohol or other acute toxidrome.  We will obtain basic labs, give fluids and IV Dilantin load and reassess.  I anticipate the patient will be stable for discharge.  ----------------------------------------- 6:32 AM on 10/30/2017 -----------------------------------------  The labs are unremarkable except for low Dilantin level.  The patient was resting comfortably for several hours.  He then developed some dry heaving although he is not actually vomiting.  He states this is due to drinking.  Given that his electrolytes are normal and he has received hydration, this is consistent with vomiting after heavy alcohol use and there is no evidence of other concerning cause.  The patient himself would like to be discharged.  I counseled the patient on the results of the  work-up and the importance of taking his seizure medication.  Return precautions given, and he expresses understanding.  ____________________________________________   FINAL CLINICAL IMPRESSION(S) / ED DIAGNOSES  Final diagnoses:  Seizure disorder (HCC)      NEW MEDICATIONS STARTED DURING THIS VISIT:  New Prescriptions   No medications on file     Note:  This document was prepared using Dragon voice recognition software and may include unintentional dictation errors.    Dionne Bucy, MD 10/30/17 [phone]

## 2017-10-30 NOTE — ED Triage Notes (Signed)
Rodney Watson was released from [facility]

## 2017-10-30 NOTE — Discharge Instructions (Addendum)
Continue to take your seizure medication as prescribed, and avoid alcohol.  Return to the ER for any new or worsening symptoms that concern you.

## 2017-10-30 NOTE — ED Triage Notes (Signed)
EMS , pt homeless, possible seizure, drank one bottle of Listerine. Pt arrives alerted and confused.

## 2018-01-21 ENCOUNTER — Inpatient Hospital Stay
Admission: EM | Admit: 2018-01-21 | Discharge: 2018-01-23 | DRG: 896 | Disposition: A | Discharge status: Home / Self Care | Payer: Medicaid Other | Attending: Internal Medicine | Admitting: Internal Medicine

## 2018-01-21 ENCOUNTER — Other Ambulatory Visit: Payer: Self-pay

## 2018-01-21 ENCOUNTER — Emergency Department: Payer: Medicaid Other

## 2018-01-21 ENCOUNTER — Encounter: Payer: Self-pay | Admitting: Neurology

## 2018-01-21 DIAGNOSIS — Z885 Allergy status to narcotic agent status: Secondary | ICD-10-CM | POA: Diagnosis not present

## 2018-01-21 DIAGNOSIS — F10239 Alcohol dependence with withdrawal, unspecified: Secondary | ICD-10-CM | POA: Diagnosis present

## 2018-01-21 DIAGNOSIS — R569 Unspecified convulsions: Secondary | ICD-10-CM | POA: Diagnosis not present

## 2018-01-21 DIAGNOSIS — F10231 Alcohol dependence with withdrawal delirium: Secondary | ICD-10-CM | POA: Diagnosis not present

## 2018-01-21 DIAGNOSIS — G9341 Metabolic encephalopathy: Secondary | ICD-10-CM | POA: Diagnosis present

## 2018-01-21 DIAGNOSIS — G40909 Epilepsy, unspecified, not intractable, without status epilepticus: Secondary | ICD-10-CM | POA: Diagnosis present

## 2018-01-21 DIAGNOSIS — R443 Hallucinations, unspecified: Secondary | ICD-10-CM | POA: Diagnosis present

## 2018-01-21 DIAGNOSIS — E039 Hypothyroidism, unspecified: Secondary | ICD-10-CM | POA: Diagnosis present

## 2018-01-21 DIAGNOSIS — F17210 Nicotine dependence, cigarettes, uncomplicated: Secondary | ICD-10-CM | POA: Diagnosis present

## 2018-01-21 DIAGNOSIS — R4182 Altered mental status, unspecified: Secondary | ICD-10-CM

## 2018-01-21 DIAGNOSIS — J449 Chronic obstructive pulmonary disease, unspecified: Secondary | ICD-10-CM | POA: Diagnosis present

## 2018-01-21 DIAGNOSIS — F1410 Cocaine abuse, uncomplicated: Secondary | ICD-10-CM | POA: Diagnosis present

## 2018-01-21 DIAGNOSIS — F10931 Alcohol use, unspecified with withdrawal delirium: Secondary | ICD-10-CM

## 2018-01-21 DIAGNOSIS — Z79899 Other long term (current) drug therapy: Secondary | ICD-10-CM | POA: Diagnosis not present

## 2018-01-21 DIAGNOSIS — F329 Major depressive disorder, single episode, unspecified: Secondary | ICD-10-CM | POA: Diagnosis present

## 2018-01-21 DIAGNOSIS — F10939 Alcohol use, unspecified with withdrawal, unspecified: Secondary | ICD-10-CM | POA: Diagnosis present

## 2018-01-21 DIAGNOSIS — G9340 Encephalopathy, unspecified: Secondary | ICD-10-CM

## 2018-01-21 LAB — URINE DRUG SCREEN, QUALITATIVE (ARMC ONLY)
Amphetamines, Ur Screen: NOT DETECTED
Barbiturates, Ur Screen: NOT DETECTED
Benzodiazepine, Ur Scrn: NOT DETECTED
Cannabinoid 50 Ng, Ur ~~LOC~~: POSITIVE — AB
Cocaine Metabolite,Ur ~~LOC~~: NOT DETECTED
MDMA (Ecstasy)Ur Screen: NOT DETECTED
Methadone Scn, Ur: NOT DETECTED
Opiate, Ur Screen: NOT DETECTED
Phencyclidine (PCP) Ur S: NOT DETECTED
Tricyclic, Ur Screen: POSITIVE — AB

## 2018-01-21 LAB — CBC WITH DIFFERENTIAL/PLATELET
ABS IMMATURE GRANULOCYTES: 0.02 10*3/uL (ref 0.00–0.07)
BASOS PCT: 1 %
Basophils Absolute: 0.1 10*3/uL (ref 0.0–0.1)
EOS PCT: 0 %
Eosinophils Absolute: 0 10*3/uL (ref 0.0–0.5)
HCT: 44.2 % (ref 39.0–52.0)
Hemoglobin: 14.8 g/dL (ref 13.0–17.0)
IMMATURE GRANULOCYTES: 0 %
Lymphocytes Relative: 27 %
Lymphs Abs: 1.7 10*3/uL (ref 0.7–4.0)
MCH: 30.4 pg (ref 26.0–34.0)
MCHC: 33.5 g/dL (ref 30.0–36.0)
MCV: 90.8 fL (ref 80.0–100.0)
Monocytes Absolute: 0.6 10*3/uL (ref 0.1–1.0)
Monocytes Relative: 8 %
NEUTROS ABS: 4.2 10*3/uL (ref 1.7–7.7)
NRBC: 0 % (ref 0.0–0.2)
Neutrophils Relative %: 64 %
PLATELETS: 231 10*3/uL (ref 150–400)
RBC: 4.87 MIL/uL (ref 4.22–5.81)
RDW: 13.3 % (ref 11.5–15.5)
WBC: 6.6 10*3/uL (ref 4.0–10.5)

## 2018-01-21 LAB — COMPREHENSIVE METABOLIC PANEL
ALT: 17 U/L (ref 0–44)
AST: 20 U/L (ref 15–41)
Albumin: 4.3 g/dL (ref 3.5–5.0)
Alkaline Phosphatase: 85 U/L (ref 38–126)
Anion gap: 8 (ref 5–15)
BUN: 9 mg/dL (ref 6–20)
CHLORIDE: 104 mmol/L (ref 98–111)
CO2: 26 mmol/L (ref 22–32)
Calcium: 8.5 mg/dL — ABNORMAL LOW (ref 8.9–10.3)
Creatinine, Ser: 0.79 mg/dL (ref 0.61–1.24)
Glucose, Bld: 86 mg/dL (ref 70–99)
POTASSIUM: 3.7 mmol/L (ref 3.5–5.1)
Sodium: 138 mmol/L (ref 135–145)
Total Bilirubin: 0.6 mg/dL (ref 0.3–1.2)
Total Protein: 7.1 g/dL (ref 6.5–8.1)

## 2018-01-21 LAB — HEMOGLOBIN A1C
Hgb A1c MFr Bld: 5.1 % (ref 4.8–5.6)
Mean Plasma Glucose: 99.67 mg/dL

## 2018-01-21 LAB — TSH: TSH: 8.837 u[IU]/mL — ABNORMAL HIGH (ref 0.350–4.500)

## 2018-01-21 LAB — MAGNESIUM: Magnesium: 2.2 mg/dL (ref 1.7–2.4)

## 2018-01-21 LAB — GLUCOSE, CAPILLARY
Glucose-Capillary: 70 mg/dL (ref 70–99)
Glucose-Capillary: 89 mg/dL (ref 70–99)
Glucose-Capillary: 102 mg/dL — ABNORMAL HIGH (ref 70–99)
Glucose-Capillary: 80 mg/dL (ref 70–99)

## 2018-01-21 LAB — ETHANOL: Alcohol, Ethyl (B): <10 mg/dL (ref <10)

## 2018-01-21 LAB — ACETAMINOPHEN LEVEL

## 2018-01-21 LAB — MRSA PCR SCREENING: MRSA by PCR: NEGATIVE

## 2018-01-21 LAB — SALICYLATE LEVEL

## 2018-01-21 LAB — AMMONIA: AMMONIA: 15 umol/L (ref 9–35)

## 2018-01-21 LAB — PHENYTOIN LEVEL, TOTAL: Phenytoin Lvl: <2.5 ug/mL — ABNORMAL LOW (ref 10.0–20.0)

## 2018-01-21 MED ORDER — VITAMIN B-1 100 MG PO TABS
100.0000 mg | ORAL_TABLET | Freq: Every day | ORAL | Status: DC
  Administered 2018-01-22 – 2018-01-23 (×2): 100 mg via ORAL
  Filled 2018-01-21 (×2): qty 1

## 2018-01-21 MED ORDER — LORAZEPAM 2 MG/ML IJ SOLN
0.0000 mg | Freq: Four times a day (QID) | INTRAMUSCULAR | Status: DC
  Administered 2018-01-21: 1 mg via INTRAVENOUS
  Filled 2018-01-21: qty 1

## 2018-01-21 MED ORDER — NICOTINE 21 MG/24HR TD PT24
21.0000 mg | MEDICATED_PATCH | Freq: Every day | TRANSDERMAL | Status: DC
  Administered 2018-01-21 – 2018-01-22 (×2): 21 mg via TRANSDERMAL
  Filled 2018-01-21 (×3): qty 1

## 2018-01-21 MED ORDER — ADULT MULTIVITAMIN W/MINERALS CH
1.0000 | ORAL_TABLET | Freq: Every day | ORAL | Status: DC
  Administered 2018-01-22 – 2018-01-23 (×2): 1 via ORAL
  Filled 2018-01-21 (×3): qty 1

## 2018-01-21 MED ORDER — LORAZEPAM 2 MG/ML IJ SOLN
2.0000 mg | INTRAMUSCULAR | Status: DC | PRN
  Filled 2018-01-21: qty 2

## 2018-01-21 MED ORDER — DOCUSATE SODIUM 100 MG PO CAPS
100.0000 mg | ORAL_CAPSULE | Freq: Two times a day (BID) | ORAL | Status: DC
  Administered 2018-01-22: 100 mg via ORAL
  Filled 2018-01-21 (×3): qty 1

## 2018-01-21 MED ORDER — ENOXAPARIN SODIUM 40 MG/0.4ML ~~LOC~~ SOLN
40.0000 mg | SUBCUTANEOUS | Status: DC
  Administered 2018-01-21: 40 mg via SUBCUTANEOUS
  Filled 2018-01-21: qty 0.4

## 2018-01-21 MED ORDER — FOLIC ACID 1 MG PO TABS
1.0000 mg | ORAL_TABLET | Freq: Every day | ORAL | Status: DC
  Administered 2018-01-22 – 2018-01-23 (×2): 1 mg via ORAL
  Filled 2018-01-21 (×3): qty 1

## 2018-01-21 MED ORDER — ACETAMINOPHEN 325 MG PO TABS: 650.0000 mg | ORAL_TABLET | Freq: Four times a day (QID) | ORAL | Status: DC | PRN

## 2018-01-21 MED ORDER — QUETIAPINE FUMARATE 25 MG PO TABS
200.0000 mg | ORAL_TABLET | Freq: Every day | ORAL | Status: DC
  Administered 2018-01-22: 200 mg via ORAL
  Filled 2018-01-21: qty 8

## 2018-01-21 MED ORDER — DEXMEDETOMIDINE HCL IN NACL 200 MCG/50ML IV SOLN
0.4000 ug/kg/h | INTRAVENOUS | Status: DC
  Filled 2018-01-21: qty 50

## 2018-01-21 MED ORDER — HALOPERIDOL LACTATE 5 MG/ML IJ SOLN
5.0000 mg | Freq: Once | INTRAMUSCULAR | Status: AC
  Administered 2018-01-21: 5 mg via INTRAVENOUS
  Filled 2018-01-21: qty 1

## 2018-01-21 MED ORDER — LEVOTHYROXINE SODIUM 25 MCG PO TABS
25.0000 ug | ORAL_TABLET | Freq: Every day | ORAL | Status: DC
  Administered 2018-01-22: 25 ug via ORAL
  Filled 2018-01-21 (×2): qty 1

## 2018-01-21 MED ORDER — PHENYTOIN SODIUM 50 MG/ML IJ SOLN
100.0000 mg | Freq: Three times a day (TID) | INTRAMUSCULAR | Status: DC
  Administered 2018-01-22 (×3): 100 mg via INTRAVENOUS
  Filled 2018-01-21 (×6): qty 2

## 2018-01-21 MED ORDER — SODIUM CHLORIDE 0.9 % IV BOLUS
1000.0000 mL | Freq: Once | INTRAVENOUS | Status: AC
  Administered 2018-01-21: 1000 mL via INTRAVENOUS

## 2018-01-21 MED ORDER — VITAMIN B-1 100 MG PO TABS: 100.0000 mg | ORAL_TABLET | Freq: Every day | ORAL | Status: DC

## 2018-01-21 MED ORDER — ALBUMIN HUMAN 25 % IV SOLN
12.5000 g | INTRAVENOUS | Status: AC
  Administered 2018-01-21: 12.5 g via INTRAVENOUS
  Filled 2018-01-21: qty 50

## 2018-01-21 MED ORDER — CLONIDINE HCL 0.1 MG PO TABS
ORAL_TABLET | ORAL | Status: AC
  Filled 2018-01-21: qty 2

## 2018-01-21 MED ORDER — CLONIDINE HCL 0.1 MG PO TABS
0.2000 mg | ORAL_TABLET | Freq: Once | ORAL | Status: AC
  Administered 2018-01-21: 0.2 mg via ORAL
  Filled 2018-01-21: qty 2

## 2018-01-21 MED ORDER — LORAZEPAM 2 MG/ML IJ SOLN
1.0000 mg | INTRAMUSCULAR | Status: DC | PRN
  Administered 2018-01-21: 2 mg via INTRAVENOUS
  Filled 2018-01-21: qty 1

## 2018-01-21 MED ORDER — NICOTINE 21 MG/24HR TD PT24: 21.0000 mg | MEDICATED_PATCH | Freq: Every day | TRANSDERMAL | Status: DC

## 2018-01-21 MED ORDER — LORAZEPAM 2 MG/ML IJ SOLN
0.0000 mg | Freq: Four times a day (QID) | INTRAMUSCULAR | Status: DC
  Administered 2018-01-21: 2 mg via INTRAVENOUS
  Filled 2018-01-21 (×2): qty 1

## 2018-01-21 MED ORDER — THIAMINE HCL 100 MG/ML IJ SOLN: 100.0000 mg | Freq: Every day | INTRAMUSCULAR | Status: DC

## 2018-01-21 MED ORDER — ONDANSETRON HCL 4 MG PO TABS: 4.0000 mg | ORAL_TABLET | Freq: Four times a day (QID) | ORAL | Status: DC | PRN

## 2018-01-21 MED ORDER — ONDANSETRON HCL 4 MG/2ML IJ SOLN: 4.0000 mg | Freq: Four times a day (QID) | INTRAMUSCULAR | Status: DC | PRN

## 2018-01-21 MED ORDER — ZIPRASIDONE MESYLATE 20 MG IM SOLR
20.0000 mg | Freq: Once | INTRAMUSCULAR | Status: AC
  Administered 2018-01-21: 20 mg via INTRAMUSCULAR
  Filled 2018-01-21: qty 20

## 2018-01-21 MED ORDER — SODIUM CHLORIDE 0.9 % IV SOLN
900.0000 mg | Freq: Once | INTRAVENOUS | Status: AC
  Administered 2018-01-21: 900 mg via INTRAVENOUS
  Filled 2018-01-21: qty 18

## 2018-01-21 MED ORDER — LORAZEPAM 1 MG PO TABS: 1.0000 mg | ORAL_TABLET | Freq: Four times a day (QID) | ORAL | Status: DC | PRN

## 2018-01-21 MED ORDER — THIAMINE HCL 100 MG/ML IJ SOLN
Freq: Once | INTRAVENOUS | Status: AC
  Administered 2018-01-21: 03:00:00 via INTRAVENOUS
  Filled 2018-01-21: qty 1000

## 2018-01-21 MED ORDER — FAMOTIDINE IN NACL 20-0.9 MG/50ML-% IV SOLN
20.0000 mg | Freq: Two times a day (BID) | INTRAVENOUS | Status: DC
  Administered 2018-01-21 – 2018-01-22 (×3): 20 mg via INTRAVENOUS
  Filled 2018-01-21 (×3): qty 50

## 2018-01-21 MED ORDER — PHENYTOIN SODIUM EXTENDED 100 MG PO CAPS
100.0000 mg | ORAL_CAPSULE | Freq: Three times a day (TID) | ORAL | Status: DC
  Filled 2018-01-21 (×3): qty 1

## 2018-01-21 MED ORDER — POTASSIUM CHLORIDE 20 MEQ PO PACK: 40.0000 meq | PACK | Freq: Once | ORAL | Status: DC

## 2018-01-21 MED ORDER — ACETAMINOPHEN 650 MG RE SUPP: 650.0000 mg | Freq: Four times a day (QID) | RECTAL | Status: DC | PRN

## 2018-01-21 MED ORDER — LORAZEPAM 2 MG/ML IJ SOLN: 1.0000 mg | Freq: Four times a day (QID) | INTRAMUSCULAR | Status: DC | PRN

## 2018-01-21 MED ORDER — DEXMEDETOMIDINE HCL IN NACL 400 MCG/100ML IV SOLN
0.2000 ug/kg/h | INTRAVENOUS | Status: DC
  Administered 2018-01-21: 0.7 ug/kg/h via INTRAVENOUS
  Administered 2018-01-21: 1.2 ug/kg/h via INTRAVENOUS
  Filled 2018-01-21: qty 100

## 2018-01-21 MED ORDER — LORAZEPAM 2 MG/ML IJ SOLN: 0.0000 mg | Freq: Two times a day (BID) | INTRAMUSCULAR | Status: DC

## 2018-01-21 MED ORDER — ENOXAPARIN SODIUM 40 MG/0.4ML ~~LOC~~ SOLN: 40.0000 mg | SUBCUTANEOUS | Status: DC

## 2018-01-21 MED ORDER — THIAMINE HCL 100 MG/ML IJ SOLN
100.0000 mg | Freq: Every day | INTRAMUSCULAR | Status: DC
  Administered 2018-01-21: 100 mg via INTRAVENOUS
  Filled 2018-01-21: qty 2

## 2018-01-21 MED ORDER — SODIUM CHLORIDE 0.9 % IV SOLN
INTRAVENOUS | Status: DC
  Administered 2018-01-21 (×2): via INTRAVENOUS

## 2018-01-21 NOTE — ED Triage Notes (Signed)
Pt is brought in via EMS from the homeless shelter due to altered mental status. Staff at the shelter thought pt might be having a seizure.pt has hx of cocaine, alcohol abuse. Pt is A&Ox2. Pt knew his name, birthday, and place.

## 2018-01-21 NOTE — Plan of Care (Signed)
Patient transferring to ICU rm 20 to be placed on drip. Report called to Elmarie Shileyiffany, Charity fundraiserN.

## 2018-01-21 NOTE — Progress Notes (Signed)
Pt transferred from the floor, pt alert and oriented to himself only and keeps stating he needs to go to his truck to get his cigarettes. Pt reoriented, but keeps trying to get out of the bed and pt is not stable on his feet. For safety pt, has been started on 1.2 precedex and given Geodon. Pt now resting comfortably, vitals stable.

## 2018-01-21 NOTE — Consult Note (Signed)
Reason for Consult: Alcohol withdrawal  Referring Physician: Dr. Lillia Watson is an 52 y.o. male.  HPI: Rodney Watson is a 52 year old gentleman with a past medical history remarkable for depression, seizures, COPD, alcohol abuse and crack cocaine use, presented to the emergency department via EMS from homeless shelter where he appeared to be agitated.  He was also noted to have a seizure.  He was admitted to the hospitalist service and was subsequently transferred to the intensive care unit for DTs.  Urine drug screen was positive for try cyclic's and cannabinoids.  Alcohol acetaminophen and salicylate levels were negative.  Patient is now resting comfortably on a Precedex infusion in the ICU  Past Medical History:  Diagnosis Date  . Alcohol abuse   . Alcohol abuse 04/01/2017  . COPD (chronic obstructive pulmonary disease) (HCC)   . Crack cocaine use   . Depression   . ETOH abuse   . Seizures (HCC)     Past Surgical History:  Procedure Laterality Date  . BACK SURGERY      Family History  Problem Relation Age of Onset  . Alcohol abuse Father   . Seizures Neg Hx     Social History:  reports that he has been smoking cigarettes. He has a 58.50 pack-year smoking history. His smokeless tobacco use includes chew. He reports that he drinks alcohol. He reports that he has current or past drug history. Drugs: Marijuana and Cocaine.  Allergies:  Allergies  Allergen Reactions  . Chocolate Hives  . Vicodin [Hydrocodone-Acetaminophen] Nausea And Vomiting    Medications: I have reviewed the patient's current medications.  Results for orders placed or performed during the hospital encounter of 01/21/18 (from the past 48 hour(s))  CBC with Differential     Status: None   Collection Time: 01/21/18  1:44 AM  Result Value Ref Range   WBC 6.6 4.0 - 10.5 K/uL   RBC 4.87 4.22 - 5.81 MIL/uL   Hemoglobin 14.8 13.0 - 17.0 g/dL   HCT 16.1 09.6 - 04.5 %   MCV 90.8 80.0 - 100.0 fL   MCH 30.4  26.0 - 34.0 pg   MCHC 33.5 30.0 - 36.0 g/dL   RDW 40.9 81.1 - 91.4 %   Platelets 231 150 - 400 K/uL   nRBC 0.0 0.0 - 0.2 %   Neutrophils Relative % 64 %   Neutro Abs 4.2 1.7 - 7.7 K/uL   Lymphocytes Relative 27 %   Lymphs Abs 1.7 0.7 - 4.0 K/uL   Monocytes Relative 8 %   Monocytes Absolute 0.6 0.1 - 1.0 K/uL   Eosinophils Relative 0 %   Eosinophils Absolute 0.0 0.0 - 0.5 K/uL   Basophils Relative 1 %   Basophils Absolute 0.1 0.0 - 0.1 K/uL   Immature Granulocytes 0 %   Abs Immature Granulocytes 0.02 0.00 - 0.07 K/uL    Comment: Performed at [facility], [address] [place], [place] [address]  Comprehensive metabolic panel     Status: Abnormal   Collection Time: 01/21/18  1:44 AM  Result Value Ref Range   Sodium 138 135 - 145 mmol/L   Potassium 3.7 3.5 - 5.1 mmol/L   Chloride 104 98 - 111 mmol/L   CO2 26 22 - 32 mmol/L   Glucose, Bld 86 70 - 99 mg/dL   BUN 9 6 - 20 mg/dL   Creatinine, Ser 6.21 0.61 - 1.24 mg/dL   Calcium 8.5 (L) 8.9 - 10.3 mg/dL  Total Protein 7.1 6.5 - 8.1 g/dL   Albumin 4.3 3.5 - 5.0 g/dL   AST 20 15 - 41 U/L   ALT 17 0 - 44 U/L   Alkaline Phosphatase 85 38 - 126 U/L   Total Bilirubin 0.6 0.3 - 1.2 mg/dL   GFR calc non Af Amer >60 >60 mL/min   GFR calc Af Amer >60 >60 mL/min   Anion gap 8 5 - 15    Comment: Performed at [facility], [address] [place], [place] [address]  Acetaminophen level     Status: Abnormal   Collection Time: 01/21/18  1:44 AM  Result Value Ref Range   Acetaminophen (Tylenol), Serum <10 (L) 10 - 30 ug/mL    Comment: (NOTE) Therapeutic concentrations vary significantly. A range of 10-30 ug/mL  may be an effective concentration for many patients. However, some  are best treated at concentrations outside of this range. Acetaminophen concentrations >150 ug/mL at 4 hours after ingestion  and >50 ug/mL at 12 hours after ingestion are often associated with  toxic reactions. Performed at [facility], [address] [place], [place] [address]   Salicylate level     Status: None   Collection Time: 01/21/18  1:44 AM  Result Value Ref Range   Salicylate Lvl <7.0 2.8 - 30.0 mg/dL    Comment: Performed at [facility], [address] [place], [place] [address]  Ethanol     Status: None   Collection Time: 01/21/18  1:44 AM  Result Value Ref Range   Alcohol, Ethyl (B) <10 <10 mg/dL    Comment: (NOTE) Lowest detectable limit for serum alcohol is 10 mg/dL. For medical purposes only. Performed at [facility], [address] [place], [place] [address]   TSH     Status: Abnormal   Collection Time: 01/21/18  1:44 AM  Result Value Ref Range   TSH 8.837 (H) 0.350 - 4.500 uIU/mL    Comment: Performed by a 3rd Generation assay with a functional sensitivity of <=0.01 uIU/mL. Performed at [facility], [address] [place], [place] [address]   Ammonia     Status: None   Collection Time: 01/21/18  2:50 AM  Result Value Ref Range   Ammonia 15 9 - 35 umol/L    Comment: Performed at [facility], [address] [place], [place] [address]  Phenytoin level, total     Status: Abnormal   Collection Time: 01/21/18  2:54 AM  Result Value Ref Range   Phenytoin Lvl <2.5 (L) 10.0 - 20.0 ug/mL    Comment: Performed at [facility], [address] [place], [place] [address]  Magnesium     Status: None   Collection Time: 01/21/18  2:54 AM  Result Value Ref Range   Magnesium 2.2 1.7 - 2.4 mg/dL    Comment: Performed at [facility], [address] [place], [place] [address]  Urine Drug Screen, Qualitative     Status: Abnormal   Collection Time: 01/21/18  6:42 AM  Result Value Ref Range   Tricyclic, Ur Screen POSITIVE (A) NONE DETECTED   Amphetamines, Ur Screen NONE DETECTED NONE DETECTED   MDMA (Ecstasy)Ur Screen NONE DETECTED NONE DETECTED   Cocaine Metabolite,Ur [place] NONE DETECTED NONE DETECTED   Opiate, Ur Screen NONE DETECTED  NONE DETECTED   Phencyclidine (PCP) Ur S NONE DETECTED NONE DETECTED   Cannabinoid 50 Ng, Ur [place] POSITIVE (A) NONE DETECTED   Barbiturates, Ur  Screen NONE DETECTED NONE DETECTED   Benzodiazepine, Ur Scrn NONE DETECTED NONE DETECTED   Methadone Scn, Ur NONE DETECTED NONE DETECTED    Comment: (NOTE) Tricyclics + metabolites, urine    Cutoff 1000 ng/mL Amphetamines + metabolites, urine  Cutoff 1000 ng/mL MDMA (Ecstasy), urine              Cutoff 500 ng/mL Cocaine Metabolite, urine          Cutoff 300 ng/mL Opiate + metabolites, urine        Cutoff 300 ng/mL Phencyclidine (PCP), urine         Cutoff 25 ng/mL Cannabinoid, urine                 Cutoff 50 ng/mL Barbiturates + metabolites, urine  Cutoff 200 ng/mL Benzodiazepine, urine              Cutoff 200 ng/mL Methadone, urine                   Cutoff 300 ng/mL The urine drug screen provides only a preliminary, unconfirmed analytical test result and should not be used for non-medical purposes. Clinical consideration and professional judgment should be applied to any positive drug screen result due to possible interfering substances. A more specific alternate chemical method must be used in order to obtain a confirmed analytical result. Gas chromatography / mass spectrometry (GC/MS) is the preferred confirmat ory method. Performed at [facility], [address] [place], [place] [address]     Dg Chest 1 View  Result Date: 01/21/2018 CLINICAL DATA:  52 year old male with altered mental status. EXAM: CHEST  1 VIEW COMPARISON:  Chest radiograph dated 09/23/2017 FINDINGS: The heart size and mediastinal contours are within normal limits. Both lungs are clear. The visualized skeletal structures are unremarkable. IMPRESSION: No active disease. Electronically Signed   By: Elgie CollardArash  Radparvar M.D.   On: 01/21/2018 02:28   Ct Head Wo Contrast  Result Date: 01/21/2018 CLINICAL DATA:  Altered mental status, combative. History of substance  abuse and seizures. EXAM: CT HEAD WITHOUT CONTRAST TECHNIQUE: Contiguous axial images were obtained from the base of the skull through the vertex without intravenous contrast. COMPARISON:  CT HEAD August 25, 2017 FINDINGS: BRAIN: No intraparenchymal hemorrhage, mass effect nor midline shift. Moderate parenchymal brain volume loss. No acute large vascular territory infarcts. No abnormal extra-axial fluid collections. Basal cisterns are patent. VASCULAR: Trace calcific atherosclerosis. SKULL/SOFT TISSUES: No skull fracture. No significant soft tissue swelling. ORBITS/SINUSES: The included ocular globes and orbital contents are normal.Trace paranasal sinus mucosal thickening. Mastoid air cells are well aerated. OTHER: None. IMPRESSION: 1. No acute intracranial process. 2. Stable moderate parenchymal brain volume loss. Electronically Signed   By: Awilda Metroourtnay  Bloomer M.D.   On: 01/21/2018 03:08    ROS  Unable to obtain secondary to orientation and confusion  Blood pressure 138/88, pulse 90, temperature 97.8 F (36.6 C), temperature source Oral, resp. rate 20, height 5\' 8"  (1.727 m), weight 56.7 kg, SpO2 96 %. Physical Exam  Vital signs: Please see the above listed vital signs HEENT: Trachea is midline, no thyromegaly appreciated, poor oral hygiene and dentition Cardiovascular: Regular rate and rhythm Pulmonary: Clear to auscultation Abdominal: Positive bowel sounds, soft nontender exam Extremities: Some excoriations, no clubbing, cyanosis or edema noted Neurologic: No focal deficits appreciated, patient is confused and tremulous  Assessment/Plan:  Alcohol withdrawal.  Patient will be placed on Precedex drip,CIWA, thiamine, folic acid, hydration close monitoring the intensive care unit  Seizures.  Most likely secondary to alcohol withdrawal.  Patient has been on Dilantin in the past, will re-dose and follow level  Cocaine abuse.  Urine drug screen was negative for cocaine, positive for opiates and try  cyclic  Prior history of hypothyroidism.  Will check TSH and begin Synthroid   Rodney Watson 01/21/2018, 9:52 AM

## 2018-01-21 NOTE — Progress Notes (Signed)
Pharmacy Electrolyte Monitoring Consult:  Pharmacy consulted to assist in monitoring and replacing electrolyte and phenytoin dosing in this 52 y.o. male admitted on 01/21/2018 with Altered Mental Status  Patient transferred from the floor to the ICU with increased agitation. Patient initiated on dexmedetomidine and ICU CIWA protocol. Ethanol level < 10. Reported that patient occasionally uses diphenhydramine to get high.   Patient takes phenytoin 100mg  PO TID. Patients phenytoin level on admission  < 2.5   Labs:  Sodium (mmol/L)  Date Value  01/21/2018 138   Potassium (mmol/L)  Date Value  01/21/2018 3.7   Magnesium (mg/dL)  Date Value  16/10/960412/04/2017 2.2   Phosphorus (mg/dL)  Date Value  54/09/811908/06/2017 3.6   Calcium (mg/dL)  Date Value  14/78/295612/04/2017 8.5 (L)   Albumin (g/dL)  Date Value  21/30/865712/04/2017 4.3    Assessment/Plan: 1. Phenytoin Dosing: will load patient with phenytoin 900mg  IV x 1. Will check phenytoin level at 0400 on 12/4. Plan to resume outpatient regimen of 100mg  IV Q8hr if level is in range.   2. Electrolytes: ordered patient potassium 40mEq PO x 1. Will obtain electrolytes with am labs. Will replace for goal magnesium ~ 2.   Pharmacy will continue to monitor and adjust per consults.   Simpson,Michael L 01/21/2018 1:16 PM

## 2018-01-21 NOTE — Plan of Care (Addendum)
Just after shift change patient is aggressive and non-compliant. Pulling on lines, attempting to climb over railings and is unsafe. Sitter trying to avoid getting hit.  CIWH protocol has been given, but patient remains unchanged and increasingly unsafe. Dr. Informed.  Refusing all PO meds.  Tele placed on standby, since patient will not leave intact.

## 2018-01-21 NOTE — ED Provider Notes (Signed)
[facility] Emergency Department Provider Note   ____________________________________________   First MD Initiated Contact with Patient 01/21/18 0210     (approximate)  I have reviewed the triage vital signs and the nursing notes.   HISTORY  Chief Complaint Altered Mental Status  Level V caveat: Limited by altered mentation  HPI Rodney Watson is a 52 y.o. male brought to the ED via EMS from homeless shelter with a chief complaint of altered mentation.  Patient has a history of alcohol abuse, seizure disorder, polysubstance abuse.  Staff at the shelter thought patient might be having a seizure.  History is unobtainable secondary to altered mentation.   Past Medical History:  Diagnosis Date  . Alcohol abuse   . Alcohol abuse 04/01/2017  . COPD (chronic obstructive pulmonary disease) (HCC)   . Crack cocaine use   . Depression   . ETOH abuse   . Seizures [facility](HCC)     Patient Active Problem List   Diagnosis Date Noted  . Severe recurrent major depression without psychotic features (HCC) 10/25/2017  . Suicide attempt (HCC) 10/24/2017  . Major depressive disorder, recurrent episode, severe (HCC) 09/29/2017  . Alcohol withdrawal (HCC) 07/18/2017  . Seizure disorder (HCC) 07/18/2017  . Essential hypertension 07/18/2017  . Alcohol abuse with alcohol-induced mood disorder (HCC) 04/10/2017    Past Surgical History:  Procedure Laterality Date  . BACK SURGERY      Prior to Admission medications   Medication Sig Start Date End Date Taking? Authorizing Provider  FLUoxetine (PROZAC) 20 MG capsule Take 1 capsule (20 mg total) by mouth daily. 10/29/17   McNew, Ileene HutchinsonHolly R, MD  gabapentin (NEURONTIN) 300 MG capsule Take 1 capsule (300 mg total) by mouth 3 (three) times daily. 10/29/17   McNew, Ileene HutchinsonHolly R, MD  levothyroxine (SYNTHROID, LEVOTHROID) 25 MCG tablet Take 1 tablet (25 mcg total) by mouth daily before breakfast. 10/29/17   McNew, Ileene HutchinsonHolly R, MD  lisinopril  (PRINIVIL,ZESTRIL) 10 MG tablet Take 1 tablet (10 mg total) by mouth daily. For high blood pressure 10/29/17   McNew, Ileene HutchinsonHolly R, MD  phenytoin (DILANTIN) 100 MG ER capsule Take 1 capsule (100 mg total) by mouth 3 (three) times daily. 10/29/17   McNew, Ileene HutchinsonHolly R, MD  QUEtiapine (SEROQUEL) 200 MG tablet Take 1 tablet (200 mg total) by mouth at bedtime. 10/29/17   McNew, Ileene HutchinsonHolly R, MD    Allergies Chocolate and Vicodin [hydrocodone-acetaminophen]  Family History  Problem Relation Age of Onset  . Alcohol abuse Father   . Seizures Neg Hx     Social History Social History   Tobacco Use  . Smoking status: Current Every Day Smoker    Packs/day: 1.50    Years: 39.00    Pack years: 58.50    Types: Cigarettes  . Smokeless tobacco: Current User    Types: Chew  Substance Use Topics  . Alcohol use: Yes    Comment: 5th daily  . Drug use: Yes    Types: Marijuana, Cocaine    Comment: 1-2 joints/week    Review of Systems  Constitutional: No fever/chills Eyes: No visual changes. ENT: No sore throat. Cardiovascular: Denies chest pain. Respiratory: Denies shortness of breath. Gastrointestinal: No abdominal pain.  No nausea, no vomiting.  No diarrhea.  No constipation. Genitourinary: Negative for dysuria. Musculoskeletal: Negative for back pain. Skin: Negative for rash. Neurological: Positive for altered mentation.  Negative for headaches, focal weakness or numbness. Psychiatric:Positive for altered mentation.  10 point review of systems limited secondary  to altered mentation ____________________________________________   PHYSICAL EXAM:  VITAL SIGNS: ED Triage Vitals  Enc Vitals Group     BP 01/21/18 0138 (!) 160/113     Pulse Rate 01/21/18 0138 (!) 103     Resp 01/21/18 0138 18     Temp 01/21/18 0138 98.5 F (36.9 C)     Temp Source 01/21/18 0138 Oral     SpO2 01/21/18 0138 99 %     Weight 01/21/18 0139 125 lb (56.7 kg)     Height 01/21/18 0139 5\' 8"  (1.727 m)     Head  Circumference --      Peak Flow --      Pain Score 01/21/18 0139 0     Pain Loc --      Pain Edu? --      Excl. in GC? --     Constitutional: Alert.  Disheveled appearing and in mild acute distress. Eyes: Conjunctivae are normal. PERRL. EOMI. Head: Atraumatic. Nose: Atraumatic. Mouth/Throat: Mucous membranes are moist.  No dental malocclusion. Neck: No stridor.  Supple neck without meningismus. Cardiovascular: Normal rate, regular rhythm. Grossly normal heart sounds.  Good peripheral circulation. Respiratory: Normal respiratory effort.  No retractions. Lungs CTAB. Gastrointestinal: Soft and nontender. No distention. No abdominal bruits. No CVA tenderness. Musculoskeletal: No lower extremity tenderness nor edema.  No joint effusions. Neurologic: Incoherent speech and language. No gross focal neurologic deficits are appreciated. MAEx4. Skin:  Skin is warm, dry and intact. No rash noted. Psychiatric: Mood and affect are bizarre. Speech and behavior are normal.  ____________________________________________   LABS (all labs ordered are listed, but only abnormal results are displayed)  Labs Reviewed  COMPREHENSIVE METABOLIC PANEL - Abnormal; Notable for the following components:      Result Value   Calcium 8.5 (*)    All other components within normal limits  ACETAMINOPHEN LEVEL - Abnormal; Notable for the following components:   Acetaminophen (Tylenol), Serum <10 (*)    All other components within normal limits  PHENYTOIN LEVEL, TOTAL - Abnormal; Notable for the following components:   Phenytoin Lvl <2.5 (*)    All other components within normal limits  CBC WITH DIFFERENTIAL/PLATELET  SALICYLATE LEVEL  ETHANOL  AMMONIA  MAGNESIUM  URINE DRUG SCREEN, QUALITATIVE ([facility] ONLY)   ____________________________________________  EKG  ED ECG REPORT I, , J, the attending physician, personally viewed and interpreted this ECG.   Date: 01/21/2018  EKG Time: 0140  Rate:  106  Rhythm: sinus tachycardia  Axis: Normal  Intervals:Prolonged QTC  ST&T Change: Nonspecific QTC prolonged compared to EKG dated 10/30/2017 ____________________________________________  RADIOLOGY  ED MD interpretation: No active cardiopulmonary process; No ICH  Official radiology report(s): Dg Chest 1 View  Result Date: 01/21/2018 CLINICAL DATA:  52 year old male with altered mental status. EXAM: CHEST  1 VIEW COMPARISON:  Chest radiograph dated 09/23/2017 FINDINGS: The heart size and mediastinal contours are within normal limits. Both lungs are clear. The visualized skeletal structures are unremarkable. IMPRESSION: No active disease. Electronically Signed   By: Elgie Collard M.D.   On: 01/21/2018 02:28   Ct Head Wo Contrast  Result Date: 01/21/2018 CLINICAL DATA:  Altered mental status, combative. History of substance abuse and seizures. EXAM: CT HEAD WITHOUT CONTRAST TECHNIQUE: Contiguous axial images were obtained from the base of the skull through the vertex without intravenous contrast. COMPARISON:  CT HEAD August 25, 2017 FINDINGS: BRAIN: No intraparenchymal hemorrhage, mass effect nor midline shift. Moderate parenchymal brain volume loss. No acute large vascular  territory infarcts. No abnormal extra-axial fluid collections. Basal cisterns are patent. VASCULAR: Trace calcific atherosclerosis. SKULL/SOFT TISSUES: No skull fracture. No significant soft tissue swelling. ORBITS/SINUSES: The included ocular globes and orbital contents are normal.Trace paranasal sinus mucosal thickening. Mastoid air cells are well aerated. OTHER: None. IMPRESSION: 1. No acute intracranial process. 2. Stable moderate parenchymal brain volume loss. Electronically Signed   By: Awilda Metro M.D.   On: 01/21/2018 03:08    ____________________________________________   PROCEDURES  Procedure(s) performed: None  Procedures  Critical Care performed: Yes, see critical care note(s)   CRITICAL  CARE Performed by: Irean Hong   Total critical care time: 45 minutes  Critical care time was exclusive of separately billable procedures and treating other patients.  Critical care was necessary to treat or prevent imminent or life-threatening deterioration.  Critical care was time spent personally by me on the following activities: development of treatment plan with patient and/or surrogate as well as nursing, discussions with consultants, evaluation of patient's response to treatment, examination of patient, obtaining history from patient or surrogate, ordering and performing treatments and interventions, ordering and review of laboratory studies, ordering and review of radiographic studies, pulse oximetry and re-evaluation of patient's condition.  ____________________________________________   INITIAL IMPRESSION / ASSESSMENT AND PLAN / ED COURSE  As part of my medical decision making, I reviewed the following data within the electronic MEDICAL RECORD NUMBER Nursing notes reviewed and incorporated, Labs reviewed, EKG interpreted, Old chart reviewed, Radiograph reviewed and Notes from prior ED visits   52 year old male brought from the homeless shelter due to altered mental status. Differential diagnosis includes, but is not limited to, alcohol, illicit or prescription medications, or other toxic ingestion; intracranial pathology such as stroke or intracerebral hemorrhage; fever or infectious causes including sepsis; hypoxemia and/or hypercarbia; uremia; trauma; endocrine related disorders such as diabetes, hypoglycemia, and thyroid-related diseases; hypertensive encephalopathy; etc.  Review of patient's record indicates he has a history of alcohol abuse, cocaine abuse, seizure disorder.  Will obtain CT head to evaluate for intracranial hemorrhage.  Will obtain toxicological lab work and urinalysis.  Consider alcohol withdrawal in the differential so will place patient on CIWA  protocol.  Clinical Course as of Jan 21 500  Tue Jan 21, 2018  1610 Patient remains very confused.  Has received Ativan per CIWA scale.  Blood pressure currently 161/124.  We will add clonidine at this time.  Will discuss with hospitalist to evaluate patient in emergency department for admission.   [JS]    Clinical Course User Index [JS] Irean Hong, MD     ____________________________________________   FINAL CLINICAL IMPRESSION(S) / ED DIAGNOSES  Final diagnoses:  Altered mental status, unspecified altered mental status type  Alcohol withdrawal syndrome, with delirium (HCC)  Encephalopathy     ED Discharge Orders    None       Note:  This document was prepared using Dragon voice recognition software and may include unintentional dictation errors.    Irean Hong, MD 01/21/18 [phone]

## 2018-01-21 NOTE — Progress Notes (Signed)
[facility] [place] [facility]   PATIENT NAME: Rodney Watson    MR#:  [id number]  DATE OF BIRTH:  04/06/1965  SUBJECTIVE:  CHIEF COMPLAINT:   Chief Complaint  Patient presents with  . Altered Mental Status  Patient seen and evaluated this morning Confused Belligerent and trying to get out of the bed Hallucinating trying to pull out IV lines   REVIEW OF SYSTEMS:    ROS  Could not be completely obtained secondary to confusion  DRUG ALLERGIES:   Allergies  Allergen Reactions  . Chocolate Hives  . Vicodin [Hydrocodone-Acetaminophen] Nausea And Vomiting    VITALS:  Blood pressure 138/88, pulse 90, temperature 99 F (37.2 C), temperature source Oral, resp. rate 20, height 5\' 8"  (1.727 m), weight 56.7 kg, SpO2 96 %.  PHYSICAL EXAMINATION:   Physical Exam  GENERAL:  52 y.o.-year-old patient lying in the bed disoriented EYES: Pupils equal, round, reactive to light and accommodation. No scleral icterus. Extraocular muscles intact.  HEENT: Head atraumatic, normocephalic. Oropharynx and nasopharynx clear.  NECK:  Supple, no jugular venous distention. No thyroid enlargement, no tenderness.  LUNGS: Normal breath sounds bilaterally, no wheezing, rales, rhonchi. No use of accessory muscles of respiration.  CARDIOVASCULAR: S1, S2 tachycardia noted. No murmurs, rubs, or gallops.  ABDOMEN: Soft, nontender, nondistended. Bowel sounds present. No organomegaly or mass.  EXTREMITIES: No cyanosis, clubbing or edema b/l.    NEUROLOGIC: Not oriented to time place and person Moves all extremities PSYCHIATRIC: The patient is alert and oriented x none SKIN: No obvious rash, lesion, or ulcer.   LABORATORY PANEL:   CBC Recent Labs  Lab 01/21/18 0144  WBC 6.6  HGB 14.8  HCT 44.2  PLT 231   ------------------------------------------------------------------------------------------------------------------ Chemistries  Recent Labs  Lab 01/21/18 0144 01/21/18 0254   NA 138  --   K 3.7  --   CL 104  --   CO2 26  --   GLUCOSE 86  --   BUN 9  --   CREATININE 0.79  --   CALCIUM 8.5*  --   MG  --  2.2  AST 20  --   ALT 17  --   ALKPHOS 85  --   BILITOT 0.6  --    ------------------------------------------------------------------------------------------------------------------  Cardiac Enzymes No results for input(s): TROPONINI in the last 168 hours. ------------------------------------------------------------------------------------------------------------------  RADIOLOGY:  Dg Chest 1 View  Result Date: 01/21/2018 CLINICAL DATA:  52 year old male with altered mental status. EXAM: CHEST  1 VIEW COMPARISON:  Chest radiograph dated 09/23/2017 FINDINGS: The heart size and mediastinal contours are within normal limits. Both lungs are clear. The visualized skeletal structures are unremarkable. IMPRESSION: No active disease. Electronically Signed   By: Elgie Collard M.D.   On: 01/21/2018 02:28   Ct Head Wo Contrast  Result Date: 01/21/2018 CLINICAL DATA:  Altered mental status, combative. History of substance abuse and seizures. EXAM: CT HEAD WITHOUT CONTRAST TECHNIQUE: Contiguous axial images were obtained from the base of the skull through the vertex without intravenous contrast. COMPARISON:  CT HEAD August 25, 2017 FINDINGS: BRAIN: No intraparenchymal hemorrhage, mass effect nor midline shift. Moderate parenchymal brain volume loss. No acute large vascular territory infarcts. No abnormal extra-axial fluid collections. Basal cisterns are patent. VASCULAR: Trace calcific atherosclerosis. SKULL/SOFT TISSUES: No skull fracture. No significant soft tissue swelling. ORBITS/SINUSES: The included ocular globes and orbital contents are normal.Trace paranasal sinus mucosal thickening. Mastoid air cells are well aerated. OTHER: None. IMPRESSION: 1. No acute intracranial process.  2. Stable moderate parenchymal brain volume loss. Electronically Signed   By: Awilda Metroourtnay   Bloomer M.D.   On: 01/21/2018 03:08     ASSESSMENT AND PLAN:  52 year old male patient with history of COPD, seizure disorder, alcohol abuse, cocaine use, depression currently under service for alcohol withdrawal  -Acute alcohol withdrawal Patient unable to be controlled with Ativan IV Be transferred to stepdown unit start patient on IV Precedex drip Continue thiamine folic acid supplements along with CIWA protocol IV fluid hydration  -Seizures secondary to alcohol withdrawal Dilantin to continue and check levels  -Substance abuse Urine toxicology positive for opiates  -Hypothyroidism Follow-up TSH and start Synthroid  -Tobacco abuse Tobacco cessation counseled six minutes Nicotine patch offered   All the records are reviewed and case discussed with Care Management/Social Worker. Management plans discussed with the patient, family and they are in agreement.  CODE STATUS: Full code  DVT Prophylaxis: SCDs  TOTAL TIME TAKING CARE OF THIS PATIENT: 45 minutes.   POSSIBLE D/C IN 2 to 3 DAYS, DEPENDING ON CLINICAL CONDITION.  Ihor AustinPavan  M.D on 01/21/2018 at 11:22 AM  Between 7am to 6pm - Pager - [phone]  After 6pm go to [email or web address] - password [email or web address]  [facility]  Office  [phone]  CC: Primary care physician; Patient, No Pcp Per  Note: This dictation was prepared with Dragon dictation along with smaller phrase technology. Any transcriptional errors that result from this process are unintentional.

## 2018-01-21 NOTE — H&P (Signed)
Rodney Watson is an 52 y.o. male.   Chief Complaint: Mental status change HPI: The patient with past medical history of substance abuse and COPD presents to the emergency department via EMS from homeless shelter where he appeared to be agitated.  At some point he appeared to have a seizure in the field.  Due to his mental status changes the emergency department staff called the hospitalist service for admission.  Past Medical History:  Diagnosis Date  . Alcohol abuse   . Alcohol abuse 04/01/2017  . COPD (chronic obstructive pulmonary disease) (HCC)   . Crack cocaine use   . Depression   . ETOH abuse   . Seizures (HCC)     Past Surgical History:  Procedure Laterality Date  . BACK SURGERY      Family History  Problem Relation Age of Onset  . Alcohol abuse Father   . Seizures Neg Hx    Social History:  reports that he has been smoking cigarettes. He has a 58.50 pack-year smoking history. His smokeless tobacco use includes chew. He reports that he drinks alcohol. He reports that he has current or past drug history. Drugs: Marijuana and Cocaine.  Allergies:  Allergies  Allergen Reactions  . Chocolate Hives  . Vicodin [Hydrocodone-Acetaminophen] Nausea And Vomiting    Medications Prior to Admission  Medication Sig Dispense Refill  . FLUoxetine (PROZAC) 20 MG capsule Take 1 capsule (20 mg total) by mouth daily. 30 capsule 0  . gabapentin (NEURONTIN) 300 MG capsule Take 1 capsule (300 mg total) by mouth 3 (three) times daily. 90 capsule 0  . levothyroxine (SYNTHROID, LEVOTHROID) 25 MCG tablet Take 1 tablet (25 mcg total) by mouth daily before breakfast. 30 tablet 0  . lisinopril (PRINIVIL,ZESTRIL) 10 MG tablet Take 1 tablet (10 mg total) by mouth daily. For high blood pressure 30 tablet 0  . phenytoin (DILANTIN) 100 MG ER capsule Take 1 capsule (100 mg total) by mouth 3 (three) times daily. 42 capsule 0  . QUEtiapine (SEROQUEL) 200 MG tablet Take 1 tablet (200 mg total) by mouth at  bedtime. 30 tablet 0    Results for orders placed or performed during the hospital encounter of 01/21/18 (from the past 48 hour(s))  CBC with Differential     Status: None   Collection Time: 01/21/18  1:44 AM  Result Value Ref Range   WBC 6.6 4.0 - 10.5 K/uL   RBC 4.87 4.22 - 5.81 MIL/uL   Hemoglobin 14.8 13.0 - 17.0 g/dL   HCT 16.1 09.6 - 04.5 %   MCV 90.8 80.0 - 100.0 fL   MCH 30.4 26.0 - 34.0 pg   MCHC 33.5 30.0 - 36.0 g/dL   RDW 40.9 81.1 - 91.4 %   Platelets 231 150 - 400 K/uL   nRBC 0.0 0.0 - 0.2 %   Neutrophils Relative % 64 %   Neutro Abs 4.2 1.7 - 7.7 K/uL   Lymphocytes Relative 27 %   Lymphs Abs 1.7 0.7 - 4.0 K/uL   Monocytes Relative 8 %   Monocytes Absolute 0.6 0.1 - 1.0 K/uL   Eosinophils Relative 0 %   Eosinophils Absolute 0.0 0.0 - 0.5 K/uL   Basophils Relative 1 %   Basophils Absolute 0.1 0.0 - 0.1 K/uL   Immature Granulocytes 0 %   Abs Immature Granulocytes 0.02 0.00 - 0.07 K/uL    Comment: Performed at [facility], [address], [place], [place] [address]  Comprehensive metabolic panel  Status: Abnormal   Collection Time: 01/21/18  1:44 AM  Result Value Ref Range   Sodium 138 135 - 145 mmol/L   Potassium 3.7 3.5 - 5.1 mmol/L   Chloride 104 98 - 111 mmol/L   CO2 26 22 - 32 mmol/L   Glucose, Bld 86 70 - 99 mg/dL   BUN 9 6 - 20 mg/dL   Creatinine, Ser 1.61 0.61 - 1.24 mg/dL   Calcium 8.5 (L) 8.9 - 10.3 mg/dL   Total Protein 7.1 6.5 - 8.1 g/dL   Albumin 4.3 3.5 - 5.0 g/dL   AST 20 15 - 41 U/L   ALT 17 0 - 44 U/L   Alkaline Phosphatase 85 38 - 126 U/L   Total Bilirubin 0.6 0.3 - 1.2 mg/dL   GFR calc non Af Amer >60 >60 mL/min   GFR calc Af Amer >60 >60 mL/min   Anion gap 8 5 - 15    Comment: Performed at [facility], [address] [place], [place] [address]  Acetaminophen level     Status: Abnormal   Collection Time: 01/21/18  1:44 AM  Result Value Ref Range   Acetaminophen (Tylenol), Serum <10 (L) 10 - 30 ug/mL     Comment: (NOTE) Therapeutic concentrations vary significantly. A range of 10-30 ug/mL  may be an effective concentration for many patients. However, some  are best treated at concentrations outside of this range. Acetaminophen concentrations >150 ug/mL at 4 hours after ingestion  and >50 ug/mL at 12 hours after ingestion are often associated with  toxic reactions. Performed at [facility], [address] [place], [place] [address]   Salicylate level     Status: None   Collection Time: 01/21/18  1:44 AM  Result Value Ref Range   Salicylate Lvl <7.0 2.8 - 30.0 mg/dL    Comment: Performed at [facility], [address] [place], [place] [address]  Ethanol     Status: None   Collection Time: 01/21/18  1:44 AM  Result Value Ref Range   Alcohol, Ethyl (B) <10 <10 mg/dL    Comment: (NOTE) Lowest detectable limit for serum alcohol is 10 mg/dL. For medical purposes only. Performed at [facility], [address] [place], [place] [address]   TSH     Status: Abnormal   Collection Time: 01/21/18  1:44 AM  Result Value Ref Range   TSH 8.837 (H) 0.350 - 4.500 uIU/mL    Comment: Performed by a 3rd Generation assay with a functional sensitivity of <=0.01 uIU/mL. Performed at [facility], [address] [place], [place] [address]   Ammonia     Status: None   Collection Time: 01/21/18  2:50 AM  Result Value Ref Range   Ammonia 15 9 - 35 umol/L    Comment: Performed at [facility], [address] [place], [place] [address]  Phenytoin level, total     Status: Abnormal   Collection Time: 01/21/18  2:54 AM  Result Value Ref Range   Phenytoin Lvl <2.5 (L) 10.0 - 20.0 ug/mL    Comment: Performed at [facility], [address], [place], [place] [address]  Magnesium     Status: None   Collection Time: 01/21/18  2:54 AM  Result Value Ref Range   Magnesium 2.2 1.7 - 2.4 mg/dL    Comment: Performed at [facility], [address], [place], [place] [address]  Urine Drug Screen, Qualitative     Status:  Abnormal   Collection Time: 01/21/18  6:42 AM  Result Value Ref Range   Tricyclic, Ur Screen POSITIVE (A) NONE DETECTED   Amphetamines, Ur Screen NONE DETECTED NONE DETECTED   MDMA (Ecstasy)Ur Screen NONE DETECTED NONE DETECTED   Cocaine Metabolite,Ur [place] NONE DETECTED NONE DETECTED   Opiate, Ur Screen NONE DETECTED NONE DETECTED   Phencyclidine (PCP) Ur S NONE DETECTED NONE DETECTED   Cannabinoid 50 Ng, Ur [place] POSITIVE (A) NONE DETECTED   Barbiturates, Ur Screen NONE DETECTED NONE DETECTED   Benzodiazepine, Ur Scrn NONE DETECTED NONE DETECTED   Methadone Scn, Ur NONE DETECTED NONE DETECTED    Comment: (NOTE) Tricyclics + metabolites, urine    Cutoff 1000 ng/mL Amphetamines + metabolites, urine  Cutoff 1000 ng/mL MDMA (Ecstasy), urine              Cutoff 500 ng/mL Cocaine Metabolite, urine          Cutoff 300 ng/mL Opiate + metabolites, urine        Cutoff 300 ng/mL Phencyclidine (PCP), urine         Cutoff 25 ng/mL Cannabinoid, urine                 Cutoff 50 ng/mL Barbiturates + metabolites, urine  Cutoff 200 ng/mL Benzodiazepine, urine              Cutoff 200 ng/mL Methadone, urine                   Cutoff 300 ng/mL The urine drug screen provides only a preliminary, unconfirmed analytical test result and should not be used for non-medical purposes. Clinical consideration and professional judgment should be applied to any positive drug screen result due to possible interfering substances. A more specific alternate chemical method must be used in order to obtain a confirmed analytical result. Gas chromatography / mass spectrometry (GC/MS) is the preferred confirmat ory method. Performed at [facility], [address] [place], [place] [id number]    Dg Chest 1 View  Result Date: 01/21/2018 CLINICAL DATA:  52 year old male with altered mental status. EXAM: CHEST  1 VIEW COMPARISON:  Chest  radiograph dated 09/23/2017 FINDINGS: The heart size and mediastinal contours are within normal limits. Both lungs are clear. The visualized skeletal structures are unremarkable. IMPRESSION: No active disease. Electronically Signed   By: Elgie CollardArash  Radparvar M.D.   On: 01/21/2018 02:28   Ct Head Wo Contrast  Result Date: 01/21/2018 CLINICAL DATA:  Altered mental status, combative. History of substance abuse and seizures. EXAM: CT HEAD WITHOUT CONTRAST TECHNIQUE: Contiguous axial images were obtained from the base of the skull through the vertex without intravenous contrast. COMPARISON:  CT HEAD August 25, 2017 FINDINGS: BRAIN: No intraparenchymal hemorrhage, mass effect nor midline shift. Moderate parenchymal brain volume loss. No acute large vascular territory infarcts. No abnormal extra-axial fluid collections. Basal cisterns are patent. VASCULAR: Trace calcific atherosclerosis. SKULL/SOFT TISSUES: No skull fracture. No significant soft tissue swelling. ORBITS/SINUSES: The included ocular globes and orbital contents are normal.Trace paranasal sinus mucosal thickening. Mastoid air cells are well aerated. OTHER: None. IMPRESSION: 1. No acute intracranial process. 2. Stable moderate parenchymal brain volume loss. Electronically Signed   By: Awilda Metroourtnay  Bloomer M.D.   On: 01/21/2018 03:08    Review of Systems  Constitutional: Negative for chills and fever.  HENT: Negative for sore throat and tinnitus.   Eyes: Negative for blurred vision and redness.  Respiratory: Negative for cough and shortness of breath.  Cardiovascular: Negative for chest pain, palpitations, orthopnea and PND.  Gastrointestinal: Negative for abdominal pain, diarrhea, nausea and vomiting.  Genitourinary: Negative for dysuria, frequency and urgency.  Musculoskeletal: Negative for joint pain and myalgias.  Skin: Negative for rash.       No lesions  Neurological: Negative for speech change, focal weakness and weakness.  Endo/Heme/Allergies:  Does not bruise/bleed easily.       No temperature intolerance  Psychiatric/Behavioral: Negative for depression and suicidal ideas.    Blood pressure 138/88, pulse 90, temperature 97.8 F (36.6 C), temperature source Oral, resp. rate 20, height 5\' 8"  (1.727 m), weight 56.7 kg, SpO2 96 %. Physical Exam  Vitals reviewed. Constitutional: He is oriented to person, place, and time. He appears well-developed and well-nourished. No distress.  HENT:  Head: Normocephalic and atraumatic.  Mouth/Throat: Oropharynx is clear and moist.  Eyes: Pupils are equal, round, and reactive to light. Conjunctivae and EOM are normal. No scleral icterus.  Neck: Normal range of motion. Neck supple. No JVD present. No tracheal deviation present. No thyromegaly present.  Cardiovascular: Normal rate, regular rhythm and normal heart sounds. Exam reveals no gallop and no friction rub.  No murmur heard. Respiratory: Effort normal and breath sounds normal. No respiratory distress.  GI: Soft. Bowel sounds are normal. He exhibits no distension. There is no tenderness.  Genitourinary:  Genitourinary Comments: Deferred  Musculoskeletal: Normal range of motion. He exhibits no edema.  Lymphadenopathy:    He has no cervical adenopathy.  Neurological: He is alert and oriented to person, place, and time. No cranial nerve deficit.  Skin: Skin is warm and dry. No rash noted. No erythema.  Psychiatric: He has a normal mood and affect. His behavior is normal. Judgment and thought content normal.     Assessment/Plan This is a 52 year old male admitted for alcohol withdrawal. 1.  Alcohol withdrawal: CIWA scale; Ativan as needed.  Hydrate with intravenous fluid.  Sitter at bedside.  Continue thiamine and folic acid supplementation 2.  Seizures: Secondary to alcohol abuse.  Continue (restart) Dilantin 3.  Cocaine abuse: Avoid beta-blockers for tachycardia 4.  Hypothyroidism: Check TSH; continue Synthroid 5.  DVT prophylaxis:  Lovenox 6.  GI prophylaxis: None The patient is a full code.  Time spent on admission orders and patient care approximately 45 minutes  Arnaldo Natal, MD 01/21/2018, 9:31 AM

## 2018-01-21 NOTE — Progress Notes (Signed)
Talked to pt[facility] caregiver [facility], who states that pt has not used drugs or alcohol in 3.5 weeks but has a history of overdosing on benadryl. MD made aware and will continue current treatment.

## 2018-01-21 NOTE — ED Notes (Signed)
Pt stated he smoked some "hard" today. Pt also stated that the last time he drunk any alcohol was 3.5 weeks ago. Pt is staring around the room and when his name is called he will look away. Pt is asked if he seeing or hearing things and he stated no.

## 2018-01-21 NOTE — ED Notes (Signed)
Called floor to give report on pt. Nurse asked to call me back

## 2018-01-21 NOTE — Progress Notes (Signed)
Pt BP 53/33, precedex stopped and bolus infusing. MD notified and ordered albumin with additional bolus. Will carry out orders and continue to monitor.

## 2018-01-22 LAB — CBC
HCT: 43.4 % (ref 39.0–52.0)
Hemoglobin: 14.3 g/dL (ref 13.0–17.0)
MCH: 30.2 pg (ref 26.0–34.0)
MCHC: 32.9 g/dL (ref 30.0–36.0)
MCV: 91.6 fL (ref 80.0–100.0)
Platelets: 236 10*3/uL (ref 150–400)
RBC: 4.74 MIL/uL (ref 4.22–5.81)
RDW: 13.2 % (ref 11.5–15.5)
WBC: 11.1 10*3/uL — ABNORMAL HIGH (ref 4.0–10.5)
nRBC: 0 % (ref 0.0–0.2)

## 2018-01-22 LAB — ALBUMIN: ALBUMIN: 4 g/dL (ref 3.5–5.0)

## 2018-01-22 LAB — BASIC METABOLIC PANEL
Anion gap: 8 (ref 5–15)
BUN: 6 mg/dL (ref 6–20)
CO2: 23 mmol/L (ref 22–32)
Calcium: 8.4 mg/dL — ABNORMAL LOW (ref 8.9–10.3)
Chloride: 106 mmol/L (ref 98–111)
Creatinine, Ser: 0.69 mg/dL (ref 0.61–1.24)
GFR calc Af Amer: >60 mL/min (ref >60)
Glucose, Bld: 81 mg/dL (ref 70–99)
Potassium: 3.7 mmol/L (ref 3.5–5.1)
Sodium: 137 mmol/L (ref 135–145)

## 2018-01-22 LAB — GLUCOSE, CAPILLARY
Glucose-Capillary: 73 mg/dL (ref 70–99)
Glucose-Capillary: 91 mg/dL (ref 70–99)

## 2018-01-22 LAB — PHENYTOIN LEVEL, TOTAL: Phenytoin Lvl: 12.3 ug/mL (ref 10.0–20.0)

## 2018-01-22 LAB — MAGNESIUM: Magnesium: 1.7 mg/dL (ref 1.7–2.4)

## 2018-01-22 MED ORDER — FLUTICASONE PROPIONATE 50 MCG/ACT NA SUSP
1.0000 | Freq: Every day | NASAL | Status: DC
  Administered 2018-01-22 – 2018-01-23 (×2): 1 via NASAL
  Filled 2018-01-22: qty 16

## 2018-01-22 MED ORDER — LORAZEPAM 2 MG/ML IJ SOLN: 1.0000 mg | Freq: Four times a day (QID) | INTRAMUSCULAR | Status: DC | PRN

## 2018-01-22 MED ORDER — LORAZEPAM 1 MG PO TABS: 1.0000 mg | ORAL_TABLET | Freq: Four times a day (QID) | ORAL | Status: DC | PRN

## 2018-01-22 MED ORDER — MAGNESIUM SULFATE 2 GM/50ML IV SOLN
2.0000 g | Freq: Once | INTRAVENOUS | Status: AC
  Administered 2018-01-22: 2 g via INTRAVENOUS
  Filled 2018-01-22: qty 50

## 2018-01-22 NOTE — Progress Notes (Signed)
Pharmacy Electrolyte Monitoring Consult:  Pharmacy consulted to assist in monitoring and replacing electrolyte and phenytoin dosing in this 52 y.o. male admitted on 01/21/2018 with Altered Mental Status  Patient transferred from the floor to the ICU with increased agitation. Patient initiated on dexmedetomidine and ICU CIWA protocol. Ethanol level < 10. Reported that patient occasionally uses diphenhydramine to get high.   Patient takes phenytoin 100mg  PO TID. Patients phenytoin level on admission  < 2.5   Labs:  Sodium (mmol/L)  Date Value  01/22/2018 137   Potassium (mmol/L)  Date Value  01/22/2018 3.7   Magnesium (mg/dL)  Date Value  16/10/960412/05/2017 1.7   Phosphorus (mg/dL)  Date Value  54/09/811908/06/2017 3.6   Calcium (mg/dL)  Date Value  14/78/295612/05/2017 8.4 (L)   Albumin (g/dL)  Date Value  21/30/865712/05/2017 4.0    Assessment/Plan: 1. Phenytoin Dosing: will load patient with phenytoin 900mg  IV x 1. Phenytoin level at 0400 on 12/4 resulted at 12.3. Continue outpatient regimen of 100mg  IV Q8hr.   2. Electrolytes:within range. Will obtain electrolytes with am labs. Will replace for goal magnesium ~ 2.   Pharmacy will continue to monitor and adjust per consults.   Clovia CuffLisa Savhanna Sliva, PharmD, BCPS 01/22/2018 1:48 PM

## 2018-01-22 NOTE — Progress Notes (Signed)
[facility] - [place] at [facility]   PATIENT NAME: Rodney Watson    MR#:  [id number]  DATE OF BIRTH:  19-Dec-1965  SUBJECTIVE:  CHIEF COMPLAINT:   Chief Complaint  Patient presents with  . Altered Mental Status  Patient seen and evaluated this morning Awake alert and oriented to time place and person Responds to all verbal commands Off Precedex drip infusion   REVIEW OF SYSTEMS:    ROS Review of Systems  Constitutional: Negative.   HENT: Negative.   Eyes: Negative.   Respiratory: Negative.   Cardiovascular: Negative.   Gastrointestinal: Negative.   Genitourinary: Negative.   Musculoskeletal: Negative.   Skin: Negative.   Neurological: Negative.   Endo/Heme/Allergies: Negative.   Psychiatric/Behavioral: Negative.   All other systems reviewed and are negative.  DRUG ALLERGIES:   Allergies  Allergen Reactions  . Chocolate Hives  . Vicodin [Hydrocodone-Acetaminophen] Nausea And Vomiting    VITALS:  Blood pressure 121/85, pulse 95, temperature 98.6 F (37 C), temperature source Oral, resp. rate 16, height 5\' 8"  (1.727 m), weight 59.1 kg, SpO2 100 %.  PHYSICAL EXAMINATION:   Physical Exam  GENERAL:  52 y.o.-year-old patient lying in the bed disoriented EYES: Pupils equal, round, reactive to light and accommodation. No scleral icterus. Extraocular muscles intact.  HEENT: Head atraumatic, normocephalic. Oropharynx and nasopharynx clear.  NECK:  Supple, no jugular venous distention. No thyroid enlargement, no tenderness.  LUNGS: Normal breath sounds bilaterally, no wheezing, rales, rhonchi. No use of accessory muscles of respiration.  CARDIOVASCULAR: S1, S2 tachycardia noted. No murmurs, rubs, or gallops.  ABDOMEN: Soft, nontender, nondistended. Bowel sounds present. No organomegaly or mass.  EXTREMITIES: No cyanosis, clubbing or edema b/l.    NEUROLOGIC: Cranial nerves II to XII intact Power 5 x 5 all extremities Sensory system intact Moves all  extremities PSYCHIATRIC: The patient is alert and oriented x 3 SKIN: No obvious rash, lesion, or ulcer.   LABORATORY PANEL:   CBC Recent Labs  Lab 01/22/18 0536  WBC 11.1*  HGB 14.3  HCT 43.4  PLT 236   ------------------------------------------------------------------------------------------------------------------ Chemistries  Recent Labs  Lab 01/21/18 0144  01/22/18 0536  NA 138  --  137  K 3.7  --  3.7  CL 104  --  106  CO2 26  --  23  GLUCOSE 86  --  81  BUN 9  --  6  CREATININE 0.79  --  0.69  CALCIUM 8.5*  --  8.4*  MG  --    < > 1.7  AST 20  --   --   ALT 17  --   --   ALKPHOS 85  --   --   BILITOT 0.6  --   --    < > = values in this interval not displayed.   ------------------------------------------------------------------------------------------------------------------  Cardiac Enzymes No results for input(s): TROPONINI in the last 168 hours. ------------------------------------------------------------------------------------------------------------------  RADIOLOGY:  Dg Chest 1 View  Result Date: 01/21/2018 CLINICAL DATA:  52 year old male with altered mental status. EXAM: CHEST  1 VIEW COMPARISON:  Chest radiograph dated 09/23/2017 FINDINGS: The heart size and mediastinal contours are within normal limits. Both lungs are clear. The visualized skeletal structures are unremarkable. IMPRESSION: No active disease. Electronically Signed   By: Elgie Collard M.D.   On: 01/21/2018 02:28   Ct Head Wo Contrast  Result Date: 01/21/2018 CLINICAL DATA:  Altered mental status, combative. History of substance abuse and seizures. EXAM: CT HEAD WITHOUT CONTRAST TECHNIQUE: Contiguous  axial images were obtained from the base of the skull through the vertex without intravenous contrast. COMPARISON:  CT HEAD August 25, 2017 FINDINGS: BRAIN: No intraparenchymal hemorrhage, mass effect nor midline shift. Moderate parenchymal brain volume loss. No acute large vascular territory  infarcts. No abnormal extra-axial fluid collections. Basal cisterns are patent. VASCULAR: Trace calcific atherosclerosis. SKULL/SOFT TISSUES: No skull fracture. No significant soft tissue swelling. ORBITS/SINUSES: The included ocular globes and orbital contents are normal.Trace paranasal sinus mucosal thickening. Mastoid air cells are well aerated. OTHER: None. IMPRESSION: 1. No acute intracranial process. 2. Stable moderate parenchymal brain volume loss. Electronically Signed   By: Awilda Metroourtnay  Bloomer M.D.   On: 01/21/2018 03:08     ASSESSMENT AND PLAN:  52 year old male patient with history of COPD, seizure disorder, alcohol abuse, cocaine use, depression currently under service for alcohol withdrawal  -Acute alcohol withdrawal improving Off Precedex drip Continue CIWA protocol Transferred to medical floor today once bed is available Continue thiamine folic acid supplements along with CIWA protocol IV fluid hydration  -Seizures secondary to alcohol withdrawal Dilantin to continue   -Substance abuse Urine toxicology positive for opiates Substance abuse counseling given to the patient  -Hypothyroidism Follow-up TSH and start Synthroid  -Tobacco abuse Tobacco cessation counseled six minutes Nicotine patch offered   All the records are reviewed and case discussed with Care Management/Social Worker. Management plans discussed with the patient, family and they are in agreement.  CODE STATUS: Full code  DVT Prophylaxis: SCDs  TOTAL TIME TAKING CARE OF THIS PATIENT: 33 minutes.   POSSIBLE D/C IN 2 to 3 DAYS, DEPENDING ON CLINICAL CONDITION.  Ihor AustinPavan  M.D on 01/22/2018 at 3:31 PM  Between 7am to 6pm - Pager - [phone]  After 6pm go to [email or web address] - password [email or web address]  [facility] [place] [facility]  Office  [phone]  CC: Primary care physician; Patient, No Pcp Per  Note: This dictation was prepared with Dragon dictation along with smaller phrase technology. Any  transcriptional errors that result from this process are unintentional.

## 2018-01-22 NOTE — Progress Notes (Signed)
Follow up - Critical Care Medicine Note  Patient Details:    Rodney Watson is an [age] y.o. male.with a past medical history remarkable for depression, seizures, COPD, alcohol abuse and crack cocaine use, presented to the emergency department via EMS from homeless shelter where he appeared to be agitated.  He was also noted to have a seizure.  He was admitted to the hospitalist service and was subsequently transferred to the intensive care unit for DTs.  Urine drug screen was positive for try cyclic's and cannabinoids.  Alcohol acetaminophen and salicylate levels were negative.   Lines, Airways, Drains: Urethral Catheter [name] Straight-tip (Active)  Indication for Insertion or Continuance of Catheter Unstable critical patients (first 24-48 hours) 01/22/2018  8:52 AM  Site Assessment Clean;Intact 01/22/2018  8:52 AM  Catheter Maintenance Bag below level of bladder;Catheter secured;Drainage bag/tubing not touching floor;Insertion date on drainage bag;No dependent loops;Seal intact;Bag emptied prior to transport 01/22/2018  8:52 AM  Collection Container Standard drainage bag 01/22/2018  8:52 AM  Securement Method Other (Comment) 01/22/2018  8:52 AM  Output (mL) 60 mL 01/22/2018  3:57 AM    Anti-infectives:  Anti-infectives (From admission, onward)   None      Microbiology: Results for orders placed or performed during the hospital encounter of 01/21/18  MRSA PCR Screening     Status: None   Collection Time: 01/21/18 12:05 PM  Result Value Ref Range Status   MRSA by PCR NEGATIVE NEGATIVE Final    Comment:        The GeneXpert MRSA Assay (FDA approved for NASAL specimens only), is one component of a comprehensive MRSA colonization surveillance program. It is not intended to diagnose MRSA infection nor to guide or monitor treatment for MRSA infections. Performed at [facility], [address] [place], [place] [address]     Studies: Dg Chest 1 View  Result Date:  01/21/2018 CLINICAL DATA:  52 year old male with altered mental status. EXAM: CHEST  1 VIEW COMPARISON:  Chest radiograph dated 09/23/2017 FINDINGS: The heart size and mediastinal contours are within normal limits. Both lungs are clear. The visualized skeletal structures are unremarkable. IMPRESSION: No active disease. Electronically Signed   By: Elgie CollardArash  Radparvar M.D.   On: 01/21/2018 02:28   Ct Head Wo Contrast  Result Date: 01/21/2018 CLINICAL DATA:  Altered mental status, combative. History of substance abuse and seizures. EXAM: CT HEAD WITHOUT CONTRAST TECHNIQUE: Contiguous axial images were obtained from the base of the skull through the vertex without intravenous contrast. COMPARISON:  CT HEAD August 25, 2017 FINDINGS: BRAIN: No intraparenchymal hemorrhage, mass effect nor midline shift. Moderate parenchymal brain volume loss. No acute large vascular territory infarcts. No abnormal extra-axial fluid collections. Basal cisterns are patent. VASCULAR: Trace calcific atherosclerosis. SKULL/SOFT TISSUES: No skull fracture. No significant soft tissue swelling. ORBITS/SINUSES: The included ocular globes and orbital contents are normal.Trace paranasal sinus mucosal thickening. Mastoid air cells are well aerated. OTHER: None. IMPRESSION: 1. No acute intracranial process. 2. Stable moderate parenchymal brain volume loss. Electronically Signed   By: Awilda Metroourtnay  Bloomer M.D.   On: 01/21/2018 03:08    Consults:    Subjective:    Overnight Issues: Patient required Precedex infusion along with Ativan and Geodon yesterday.  This morning patient is awake, alert, oriented, pleasant, has been weaned off of Precedex  Objective:  Vital signs for last 24 hours: Temp:  [98.6 F (37 C)-99.1 F (37.3 C)] 98.6 F (37 C) (12/04 0200) Pulse Rate:  [51-107] 95 (12/04 0800)  Resp:  [13-23] 16 (12/04 0800) BP: (53-148)/(33-118) 121/85 (12/04 0800) SpO2:  [98 %-100 %] 100 % (12/04 0600) Weight:  [59.1 kg] 59.1 kg (12/04  0500)  Hemodynamic parameters for last 24 hours:    Intake/Output from previous day: 12/03 0701 - 12/04 0700 In: 0  Out: 2395 [Urine:2395]  Intake/Output this shift: No intake/output data recorded.  Physical Exam:  Physical Exam  Vital signs:       Please see the above listed vital signs HEENT:           Trachea is midline, no thyromegaly appreciated, poor oral hygiene and dentition Cardiovascular:           Regular rate and rhythm Pulmonary:      Clear to auscultation Abdominal:      Positive bowel sounds, soft nontender exam Extremities:     Some excoriations, no clubbing, cyanosis or edema noted Neurologic:      No focal deficits appreciated, she is awake alert and oriented today  Assessment/Plan:   Alcohol withdrawal. On CIWA, thiamine, multivitamins and hydration.  Patient has been weaned off of Precedex.  States he has not had a drink in 79 days  Seizures.  Most likely secondary to alcohol withdrawal.  Patient has been on Dilantin in the past, will re-dose and follow level  Cocaine abuse.  Urine drug screen was negative for cocaine, positive for opiates and try cyclic  Prior history of hypothyroidism.  Will check TSH and begin Synthroid  Patient has significantly improved and stable for floor transfer  Tora Kindred, DO [place] 01/22/2018  *Care during the described time interval was provided by me and/or other providers on the critical care team.  I have reviewed this patient's available data, including medical history, events of note, physical examination and test results as part of my evaluation.

## 2018-01-23 LAB — BASIC METABOLIC PANEL
Anion gap: 8 (ref 5–15)
BUN: 11 mg/dL (ref 6–20)
CO2: 25 mmol/L (ref 22–32)
Calcium: 8.5 mg/dL — ABNORMAL LOW (ref 8.9–10.3)
Chloride: 107 mmol/L (ref 98–111)
Creatinine, Ser: 0.71 mg/dL (ref 0.61–1.24)
GFR calc non Af Amer: >60 mL/min (ref >60)
Glucose, Bld: 87 mg/dL (ref 70–99)
Potassium: 3.5 mmol/L (ref 3.5–5.1)
Sodium: 140 mmol/L (ref 135–145)

## 2018-01-23 LAB — GLUCOSE, CAPILLARY
Glucose-Capillary: 73 mg/dL (ref 70–99)
Glucose-Capillary: 73 mg/dL (ref 70–99)
Glucose-Capillary: 80 mg/dL (ref 70–99)

## 2018-01-23 LAB — MAGNESIUM: Magnesium: 2.2 mg/dL (ref 1.7–2.4)

## 2018-01-23 MED ORDER — PHENYTOIN SODIUM EXTENDED 100 MG PO CAPS: 100.0000 mg | ORAL_CAPSULE | Freq: Three times a day (TID) | ORAL | 0 refills | Status: DC

## 2018-01-23 MED ORDER — POTASSIUM CHLORIDE CRYS ER 20 MEQ PO TBCR
20.0000 meq | EXTENDED_RELEASE_TABLET | Freq: Once | ORAL | Status: AC
  Administered 2018-01-23: 20 meq via ORAL
  Filled 2018-01-23: qty 1

## 2018-01-23 NOTE — Progress Notes (Signed)
Advanced care plan.  Purpose of the Encounter: CODE STATUS  Parties in Attendance: Patient  Patient's Decision Capacity: Good  Subjective/Patient's story: Patient presented to the emergency room initially for withdrawal   Objective/Medical story Patient took Benadryl along with coffee He had some delirium and hallucinations Needs Ativan and Precedex Substance abuse counseling Tobacco cessation counseling   Goals of care determination:  Advance care directives goals of care treatment plan discussed Patient wants everything done for now which includes CPR, intubation ventilator if the need arises   CODE STATUS: Full code   Time spent discussing advanced care planning: 16 minutes

## 2018-01-23 NOTE — Discharge Summary (Addendum)
[facility] - [place] at [facility]   PATIENT NAME: Rodney Watson    MR#:  [id number]  DATE OF BIRTH:  03/13/1965  DATE OF ADMISSION:  01/21/2018 ADMITTING PHYSICIAN: Arnaldo NatalMichael S Diamond, MD  DATE OF DISCHARGE: 01/23/2018  PRIMARY CARE PHYSICIAN: Patient, No Pcp Per   ADMISSION DIAGNOSIS:  Encephalopathy [G93.40] Alcohol withdrawal syndrome, with delirium (HCC) [F10.231] Altered mental status, unspecified altered mental status type [R41.82] Seizure disorder Cocaine abuse Hypothyroidism DISCHARGE DIAGNOSIS:  Active Problems:   Alcohol withdrawal (HCC) Substance abuse Encephalopathy metabolic Seizure disorder  SECONDARY DIAGNOSIS:   Past Medical History:  Diagnosis Date  . Alcohol abuse   . Alcohol abuse 04/01/2017  . COPD (chronic obstructive pulmonary disease) (HCC)   . Crack cocaine use   . Depression   . ETOH abuse   . Seizures (HCC)      ADMITTING HISTORY The patient with past medical history of substance abuse and COPD presents to the emergency department via EMS from homeless shelter where he appeared to be agitated.  At some point he appeared to have a seizure in the field.  Due to his mental status changes the emergency department staff called the hospitalist service for admission.  HOSPITAL COURSE:  Patient was admitted to medical floor initially put on CIWA protocol and aggressive IV fluid hydration.  He had a lot hallucinations and was confused and delirious.  Patient was moved to ICU put on IV Precedex drip for sedation.  Electrolytes were corrected.  Patient was subsequently weaned off IV Precedex drip.  Was transferred to medical floor he continued thiamine folic acid supplements along with CIWA protocol.  Substance abuse counseling given to the patient.  He is Dilantin was resumed for seizure disorder.  CONSULTS OBTAINED:    DRUG ALLERGIES:   Allergies  Allergen Reactions  . Chocolate Hives  . Vicodin [Hydrocodone-Acetaminophen] Nausea  And Vomiting    DISCHARGE MEDICATIONS:   Allergies as of 01/23/2018      Reactions   Chocolate Hives   Vicodin [hydrocodone-acetaminophen] Nausea And Vomiting      Medication List    STOP taking these medications   BC HEADACHE POWDER PO     TAKE these medications   FLUoxetine 20 MG capsule Commonly known as:  PROZAC Take 1 capsule (20 mg total) by mouth daily.   gabapentin 300 MG capsule Commonly known as:  NEURONTIN Take 1 capsule (300 mg total) by mouth 3 (three) times daily.   levothyroxine 25 MCG tablet Commonly known as:  SYNTHROID, LEVOTHROID Take 1 tablet (25 mcg total) by mouth daily before breakfast.   lisinopril 10 MG tablet Commonly known as:  PRINIVIL,ZESTRIL Take 1 tablet (10 mg total) by mouth daily. For high blood pressure   phenytoin 100 MG ER capsule Commonly known as:  DILANTIN Take 1 capsule (100 mg total) by mouth 3 (three) times daily.   QUEtiapine 200 MG tablet Commonly known as:  SEROQUEL Take 1 tablet (200 mg total) by mouth at bedtime.       Today  Patient seen today No confusion no tremors Tolerating diet well Tobacco cessation counseled to the patient for 6 minutes Nicotine patch offered  VITAL SIGNS:  Blood pressure 126/90, pulse 88, temperature 98.2 F (36.8 C), temperature source Oral, resp. rate 19, height 5\' 8"  (1.727 m), weight 59.1 kg, SpO2 100 %.  I/O:    Intake/Output Summary (Last 24 hours) at 01/23/2018 1144 Last data filed at 01/23/2018 0915 Gross per 24 hour  Intake  4106.25 ml  Output 1900 ml  Net 2206.25 ml    PHYSICAL EXAMINATION:  Physical Exam  GENERAL:  52 y.o.-year-old patient lying in the bed with no acute distress.  LUNGS: Normal breath sounds bilaterally, no wheezing, rales,rhonchi or crepitation. No use of accessory muscles of respiration.  CARDIOVASCULAR: S1, S2 normal. No murmurs, rubs, or gallops.  ABDOMEN: Soft, non-tender, non-distended. Bowel sounds present. No organomegaly or mass.   NEUROLOGIC: Moves all 4 extremities. PSYCHIATRIC: The patient is alert and oriented x 3.  SKIN: No obvious rash, lesion, or ulcer.   DATA REVIEW:   CBC Recent Labs  Lab 01/22/18 0536  WBC 11.1*  HGB 14.3  HCT 43.4  PLT 236    Chemistries  Recent Labs  Lab 01/21/18 0144  01/23/18 0404  NA 138   < > 140  K 3.7   < > 3.5  CL 104   < > 107  CO2 26   < > 25  GLUCOSE 86   < > 87  BUN 9   < > 11  CREATININE 0.79   < > 0.71  CALCIUM 8.5*   < > 8.5*  MG  --    < > 2.2  AST 20  --   --   ALT 17  --   --   ALKPHOS 85  --   --   BILITOT 0.6  --   --    < > = values in this interval not displayed.    Cardiac Enzymes No results for input(s): TROPONINI in the last 168 hours.  Microbiology Results  Results for orders placed or performed during the hospital encounter of 01/21/18  MRSA PCR Screening     Status: None   Collection Time: 01/21/18 12:05 PM  Result Value Ref Range Status   MRSA by PCR NEGATIVE NEGATIVE Final    Comment:        The GeneXpert MRSA Assay (FDA approved for NASAL specimens only), is one component of a comprehensive MRSA colonization surveillance program. It is not intended to diagnose MRSA infection nor to guide or monitor treatment for MRSA infections. Performed at [facility], [address], [place], [place] [address]     RADIOLOGY:  No results found.  Follow up with PCP in 1 week.  Management plans discussed with the patient, family and they are in agreement.  CODE STATUS: Full code    Code Status Orders  (From admission, onward)         Start     Ordered   01/21/18 0631  Full code  Continuous     01/21/18 0631        Code Status History    Date Active Date Inactive Code Status Order ID Comments User Context   10/25/2017 0807 10/29/2017 1603 Full Code [id number]  Audery Amel, MD Inpatient   09/29/2017 2218 10/04/2017 1201 Full Code [id number]  Darliss Ridgel, MD Inpatient   09/23/2017 0231 09/24/2017 1532 Full Code  [id number]  Barbaraann Rondo, MD ED   08/08/2017 1849 08/09/2017 1131 Full Code [id number]  Jeannie Fend, PA-C ED   07/18/2017 1225 07/19/2017 1353 Full Code [id number]  Narda Bonds, MD ED   06/29/2017 1826 07/03/2017 1458 Full Code [id number]  Laveda Abbe, NP Inpatient   06/28/2017 0620 06/29/2017 1732 Full Code [id number]  Ward, Layla Maw, DO ED   06/16/2017 1830 06/17/2017 1638 Full Code [id number]  Ward, Chase Picket, PA-C ED   06/01/2017 2239  06/02/2017 1653 Full Code [id number]  Rolland Porter, MD ED   05/16/2017 0159 05/20/2017 1454 Full Code [id number]  Kerry Hough, PA-C Inpatient   05/15/2017 1804 05/16/2017 0126 Full Code [id number]  Dietrich Pates, PA-C ED   05/11/2017 1902 05/12/2017 1616 Full Code [id number]  Maxwell Caul, PA-C ED   04/13/2017 1258 04/16/2017 2139 Full Code [id number]  Delila Pereyra, NP Inpatient   04/12/2017 0048 04/13/2017 1223 Full Code [id number]  Muthersbaugh, Boyd Kerbs ED   04/09/2017 2323 04/10/2017 1654 Full Code [id number]  Ward, Chase Picket, PA-C ED   04/01/2017 1720 04/06/2017 2236 Full Code [id number]  Oneta Rack, NP Inpatient   03/31/2017 0516 04/01/2017 1530 Full Code [id number]  Glynn Octave, MD ED      TOTAL TIME TAKING CARE OF THIS PATIENT ON DAY OF DISCHARGE: more than 35 minutes.   Ihor Austin M.D on 01/23/2018 at 11:44 AM  Between 7am to 6pm - Pager - [phone]  After 6pm go to [email or web address] - password [email or web address]  [facility] [place] [facility]  Office  [phone]  CC: Primary care physician; Patient, No Pcp Per  Note: This dictation was prepared with Dragon dictation along with smaller phrase technology. Any transcriptional errors that result from this process are unintentional.

## 2018-01-23 NOTE — Progress Notes (Addendum)
DISCHARGE NOTE:  Pt given discharge instructions and script. Pt verbalized understanding.  Pt wheeled to car by staff.

## 2018-01-23 NOTE — Clinical Social Work Note (Signed)
Clinical Social Work Assessment  Patient Details  Name: Graysin Luczynski MRN: [id number] Date of Birth: 28-Feb-1965  Date of referral:  01/23/18               Reason for consult:  [employer], Substance Use/ETOH Abuse, Housing Concerns/Homelessness                Permission sought to share information with:    Permission granted to share information::     Name::        Agency::     Relationship::     Contact Information:     Housing/Transportation Living arrangements for the past 2 months:  Barrister's clerk of Information:  Patient Patient Interpreter Needed:  None Criminal Activity/Legal Involvement Pertinent to Current Situation/Hospitalization:  No - Comment as needed Significant Relationships:  Friend Lives with:  Self Do you feel safe going back to the place where you live?  Yes Need for family participation in patient care:  Yes (Comment)  Care giving concerns:  Patient has been living at [facility] in [place] for 4 months.    Social Worker assessment / plan:  Holiday representative (CSW) reviewed chart and noted that patient is from a homeless shelter. CSW met with patient today alone at bedside prior to his discharge. Patient was alert and oriented X4, fully dressed and laying in the bed. CSW introduced self and explained role of CSW department. Per patient he lives at [facility] and pays rent to them. Per patient he has medicaid and SSI/ disability. Per patient he receives $771 per month in SSI. 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 Per patient today marks 80 days sober from alcohol for him. Patient reported that he is on probation for 1 year for drinking mouth wash in [place]. Patient reported that he is doing a lot better and is taking "Task" classes for 8 weeks to teach him coping skills. Patient reported that his friend Levada Dy from [employer] will pick him up today. CSW  provided patient with a list of outpatient substance abuse resources in [place]. Patient accepted resources and thanked CSW for visit. Patient reported no needs at this time. RN aware of above. Please reconsult if future social work needs arise. CSW signing off.   Employment status:  Disabled (Comment on whether or not currently receiving Disability) Insurance information:  Medicaid In [place] PT Recommendations:  Not assessed at this time Information / Referral to community resources:  Outpatient Substance Abuse Treatment Options  Patient/Family's Response to care:  Patient is very motivated to continue his recovery from alcohol and has made great progress.   Patient/Family's Understanding of and Emotional Response to Diagnosis, Current Treatment, and Prognosis:  Patient was very pleasant and thanked CSW for visit.   Emotional Assessment Appearance:  Appears stated age Attitude/Demeanor/Rapport:    Affect (typically observed):  Accepting, Adaptable, Pleasant Orientation:  Oriented to Self, Oriented to Place, Oriented to  Time, Oriented to Situation Alcohol / Substance use:  Illicit Drugs, Alcohol Use Psych involvement (Current and /or in the community):  No (Comment)  Discharge Needs  Concerns to be addressed:  No discharge needs identified Readmission within the last 30 days:  No Current discharge risk:  None Barriers to Discharge:  No Barriers Identified   Cyenna Rebello, Veronia Beets, LCSW 01/23/2018, 11:30 AM

## 2018-04-16 ENCOUNTER — Emergency Department
Admission: EM | Admit: 2018-04-16 | Discharge: 2018-04-17 | Disposition: A | Discharge status: Home / Self Care | Payer: Medicaid Other | Attending: Emergency Medicine | Admitting: Emergency Medicine

## 2018-04-16 DIAGNOSIS — F329 Major depressive disorder, single episode, unspecified: Secondary | ICD-10-CM | POA: Insufficient documentation

## 2018-04-16 DIAGNOSIS — F4321 Adjustment disorder with depressed mood: Secondary | ICD-10-CM | POA: Diagnosis not present

## 2018-04-16 DIAGNOSIS — R4182 Altered mental status, unspecified: Secondary | ICD-10-CM | POA: Diagnosis present

## 2018-04-16 DIAGNOSIS — T450X4A Poisoning by antiallergic and antiemetic drugs, undetermined, initial encounter: Secondary | ICD-10-CM | POA: Diagnosis not present

## 2018-04-16 DIAGNOSIS — F1010 Alcohol abuse, uncomplicated: Secondary | ICD-10-CM | POA: Insufficient documentation

## 2018-04-16 DIAGNOSIS — F17210 Nicotine dependence, cigarettes, uncomplicated: Secondary | ICD-10-CM | POA: Insufficient documentation

## 2018-04-16 DIAGNOSIS — J449 Chronic obstructive pulmonary disease, unspecified: Secondary | ICD-10-CM | POA: Insufficient documentation

## 2018-04-16 DIAGNOSIS — Z79899 Other long term (current) drug therapy: Secondary | ICD-10-CM | POA: Insufficient documentation

## 2018-04-16 DIAGNOSIS — F1490 Cocaine use, unspecified, uncomplicated: Secondary | ICD-10-CM | POA: Diagnosis not present

## 2018-04-16 DIAGNOSIS — T50904A Poisoning by unspecified drugs, medicaments and biological substances, undetermined, initial encounter: Secondary | ICD-10-CM

## 2018-04-16 LAB — CBC
HCT: 43.7 % (ref 39.0–52.0)
Hemoglobin: 14.5 g/dL (ref 13.0–17.0)
MCH: 29.9 pg (ref 26.0–34.0)
MCHC: 33.2 g/dL (ref 30.0–36.0)
MCV: 90.1 fL (ref 80.0–100.0)
Platelets: 223 10*3/uL (ref 150–400)
RBC: 4.85 MIL/uL (ref 4.22–5.81)
RDW: 14 % (ref 11.5–15.5)
WBC: 6.3 10*3/uL (ref 4.0–10.5)
nRBC: 0 % (ref 0.0–0.2)

## 2018-04-16 LAB — URINALYSIS, COMPLETE (UACMP) WITH MICROSCOPIC
Bacteria, UA: NONE SEEN
Bilirubin Urine: NEGATIVE
Glucose, UA: NEGATIVE mg/dL
Hgb urine dipstick: NEGATIVE
Ketones, ur: NEGATIVE mg/dL
Leukocytes,Ua: NEGATIVE
Nitrite: NEGATIVE
Protein, ur: NEGATIVE mg/dL
Specific Gravity, Urine: 1.003 — ABNORMAL LOW (ref 1.005–1.030)
Squamous Epithelial / HPF: NONE SEEN (ref 0–5)
pH: 7 (ref 5.0–8.0)

## 2018-04-16 LAB — BASIC METABOLIC PANEL
Anion gap: 9 (ref 5–15)
BUN: 10 mg/dL (ref 6–20)
CO2: 26 mmol/L (ref 22–32)
CREATININE: 0.88 mg/dL (ref 0.61–1.24)
Calcium: 9.1 mg/dL (ref 8.9–10.3)
Chloride: 104 mmol/L (ref 98–111)
GFR calc Af Amer: >60 mL/min (ref >60)
GFR calc non Af Amer: >60 mL/min (ref >60)
GLUCOSE: 72 mg/dL (ref 70–99)
Potassium: 3.6 mmol/L (ref 3.5–5.1)
Sodium: 139 mmol/L (ref 135–145)

## 2018-04-16 LAB — ACETAMINOPHEN LEVEL: Acetaminophen (Tylenol), Serum: <10 ug/mL — ABNORMAL LOW (ref 10–30)

## 2018-04-16 LAB — SALICYLATE LEVEL: Salicylate Lvl: <7 mg/dL (ref 2.8–30.0)

## 2018-04-16 LAB — MAGNESIUM: Magnesium: 2 mg/dL (ref 1.7–2.4)

## 2018-04-16 MED ORDER — SODIUM BICARBONATE 8.4 % IV SOLN
50.0000 meq | Freq: Once | INTRAVENOUS | Status: AC
  Administered 2018-04-16: 50 meq via INTRAVENOUS
  Filled 2018-04-16: qty 50

## 2018-04-16 NOTE — ED Notes (Signed)
Poison control called spoke with Romeo Apple the pharmacist. Recommendations for observation for at least 6 hours, labs, repeat ekg check magnesium and potasium levels, if patient has sz or becomes combative treat with benzo. unitil back to baseline.

## 2018-04-16 NOTE — ED Notes (Signed)
Called [facility] for consult spoke to [place]  [phone]

## 2018-04-16 NOTE — ED Provider Notes (Signed)
[facility] Emergency Department Provider Note  ____________________________________________   I have reviewed the triage vital signs and the nursing notes.   HISTORY  Chief Complaint Altered Mental Status   History limited by and level 5 caveat due to: Altered Mental Status   HPI Rodney Watson is a 53 y.o. male who presents to the emergency department today because of concerns for altered mental status.  Patient himself cannot give a good history.  He does state that he took Benadryl today but cannot state how many or why he took the Benadryl.  However [facility] stated [facility] might have [facility] another medication.   Per medical record review patient has a history of polysubstance abuse, suicide attempt, depression.  Past Medical History:  Diagnosis Date  . Alcohol abuse   . Alcohol abuse 04/01/2017  . COPD (chronic obstructive pulmonary disease) (HCC)   . Crack cocaine use   . Depression   . ETOH abuse   . Seizures [facility])     Patient Active Problem List   Diagnosis Date Noted  . Severe recurrent major depression without psychotic features (HCC) 10/25/2017  . Suicide attempt (HCC) 10/24/2017  . Major depressive disorder, recurrent episode, severe (HCC) 09/29/2017  . Alcohol withdrawal (HCC) 07/18/2017  . Seizure disorder (HCC) 07/18/2017  . Essential hypertension 07/18/2017  . Alcohol abuse with alcohol-induced mood disorder (HCC) 04/10/2017    Past Surgical History:  Procedure Laterality Date  . BACK SURGERY      Prior to Admission medications   Medication Sig Start Date End Date Taking? Authorizing Provider  FLUoxetine (PROZAC) 20 MG capsule Take 1 capsule (20 mg total) by mouth daily. 10/29/17   McNew, Ileene Hutchinson, MD  gabapentin (NEURONTIN) 300 MG capsule Take 1 capsule (300 mg total) by mouth 3 (three) times daily. 10/29/17   McNew, Ileene Hutchinson, MD  levothyroxine (SYNTHROID, LEVOTHROID) 25 MCG tablet Take 1 tablet  (25 mcg total) by mouth daily before breakfast. 10/29/17   McNew, Ileene Hutchinson, MD  lisinopril (PRINIVIL,ZESTRIL) 10 MG tablet Take 1 tablet (10 mg total) by mouth daily. For high blood pressure 10/29/17   McNew, Ileene Hutchinson, MD  phenytoin (DILANTIN) 100 MG ER capsule Take 1 capsule (100 mg total) by mouth 3 (three) times daily. 01/23/18 02/22/18  Ihor Austin, MD  QUEtiapine (SEROQUEL) 200 MG tablet Take 1 tablet (200 mg total) by mouth at bedtime. 10/29/17   McNew, Ileene Hutchinson, MD    Allergies Chocolate and Vicodin [hydrocodone-acetaminophen]  Family History  Problem Relation Age of Onset  . Alcohol abuse Father   . Seizures Neg Hx     Social History Social History   Tobacco Use  . Smoking status: Current Every Day Smoker    Packs/day: 1.50    Years: 39.00    Pack years: 58.50    Types: Cigarettes  . Smokeless tobacco: Current User    Types: Chew  Substance Use Topics  . Alcohol use: Yes    Comment: 5th daily  . Drug use: Yes    Types: Marijuana, Cocaine    Comment: 1-2 joints/week    Review of Systems Unable to obtain reliable ROS secondary to altered mental status.  ____________________________________________   PHYSICAL EXAM:  VITAL SIGNS: ED Triage Vitals  Enc Vitals Group     BP 04/16/18 2151 (!) 148/90     Pulse Rate 04/16/18 2151 (!) 102     Resp 04/16/18 2151 20  Temp 04/16/18 2151 98.3 F (36.8 C)     Temp Source 04/16/18 2151 Oral     SpO2 04/16/18 2151 98 %     Weight 04/16/18 2153 170 lb (77.1 kg)     Height 04/16/18 2153 5\' 11"  (1.803 m)   Constitutional: Awake and alert. Not oriented.  Eyes: Conjunctivae are normal. Pupils reactive.  ENT      Head: Normocephalic and atraumatic.      Nose: No congestion/rhinnorhea.      Mouth/Throat: Mucous membranes are moist.      Neck: No stridor. Hematological/Lymphatic/Immunilogical: No cervical lymphadenopathy. Cardiovascular: Tachycardic, regular rhythm.  No murmurs, rubs, or gallops.  Respiratory: Normal  respiratory effort without tachypnea nor retractions. Breath sounds are clear and equal bilaterally. No wheezes/rales/rhonchi. Gastrointestinal: Soft and non tender. No rebound. No guarding.  Genitourinary: Deferred Musculoskeletal: Normal range of motion in all extremities. No lower extremity edema. Neurologic:  Awake and alert. Not oriented.  Skin:  Skin is warm, dry and intact. No rash noted. ____________________________________________    LABS (pertinent positives/negatives)  Magnesium 2.0 Acetaminophen, salicylate below threshold BMP wnl CBC wbc 6.3, hgb 14.5, plt 223  ____________________________________________   EKG  I, Phineas Semen, attending physician, personally viewed and interpreted this EKG  EKG Time: 2152 Rate: 103 Rhythm: sinus tachycardia Axis: right axis deviation Intervals: qtc 514 QRS: narrow, q waves v1, v2, v3 ST changes: no st elevation, st depression v5 Impression: abnormal ekg  ____________________________________________    RADIOLOGY  None  ____________________________________________   PROCEDURES  Procedures  ____________________________________________   INITIAL IMPRESSION / ASSESSMENT AND PLAN / ED COURSE  Pertinent labs & imaging results that were available during my care of the patient were reviewed by me and considered in my medical decision making (see chart for details).   Patient presents patient presents to the emergency department today because of concerns for altered mental status and ingestion.  Patient states that he took Benadryl.  It is unclear if this is truly with patient likely salicylate and Tylenol levels below threshold.  Is altered.  He does have a history of polysubstance abuse.  Also has a history of suicidal ideation.  Because of this patient was placed under IVC.  Poison control was contacted.  ____________________________________________   FINAL CLINICAL IMPRESSION(S) / ED DIAGNOSES  Final diagnoses:   Altered mental status, unspecified altered mental status type  Drug overdose, undetermined intent, initial encounter     Note: This dictation was prepared with Dragon dictation. Any transcriptional errors that result from this process are unintentional     Phineas Semen, MD 04/16/18 2314

## 2018-04-16 NOTE — ED Notes (Signed)
IVC 

## 2018-04-16 NOTE — ED Triage Notes (Signed)
As per patient patient took 73 benadryl's, as per friend who called em[facility]. Reports change in behavior, hallucinating, grabbing for things not there. m

## 2018-04-17 LAB — BASIC METABOLIC PANEL
Anion gap: 10 (ref 5–15)
BUN: 9 mg/dL (ref 6–20)
CO2: 24 mmol/L (ref 22–32)
CREATININE: 0.78 mg/dL (ref 0.61–1.24)
Calcium: 9.1 mg/dL (ref 8.9–10.3)
Chloride: 104 mmol/L (ref 98–111)
GFR calc Af Amer: >60 mL/min (ref >60)
GFR calc non Af Amer: >60 mL/min (ref >60)
Glucose, Bld: 82 mg/dL (ref 70–99)
Potassium: 4.5 mmol/L (ref 3.5–5.1)
Sodium: 138 mmol/L (ref 135–145)

## 2018-04-17 LAB — MAGNESIUM: Magnesium: 2.1 mg/dL (ref 1.7–2.4)

## 2018-04-17 LAB — URINE DRUG SCREEN, QUALITATIVE (ARMC ONLY)
Amphetamines, Ur Screen: NOT DETECTED
Barbiturates, Ur Screen: NOT DETECTED
Benzodiazepine, Ur Scrn: NOT DETECTED
COCAINE METABOLITE, UR ~~LOC~~: NOT DETECTED
Cannabinoid 50 Ng, Ur ~~LOC~~: POSITIVE — AB
MDMA (Ecstasy)Ur Screen: NOT DETECTED
Methadone Scn, Ur: NOT DETECTED
Opiate, Ur Screen: NOT DETECTED
Phencyclidine (PCP) Ur S: NOT DETECTED
Tricyclic, Ur Screen: POSITIVE — AB

## 2018-04-17 LAB — ACETAMINOPHEN LEVEL: Acetaminophen (Tylenol), Serum: <10 ug/mL — ABNORMAL LOW (ref 10–30)

## 2018-04-17 NOTE — ED Notes (Addendum)
Patient talking to SOC 

## 2018-04-17 NOTE — ED Notes (Signed)
Pt. Is to be kept on cardiac monitor until 4 am.

## 2018-04-17 NOTE — ED Notes (Signed)
Pt. Helped to the bathroom using wheelchair.  Pt. Changed into new burgundy pants and depends undergarment.  Pt. Assisted back to bed, using wheelchair.  Pt. Unsteady on feet and appears to be hallucinating.   Patients bed sheets changed, chucks placed on patients bed.

## 2018-04-17 NOTE — ED Notes (Signed)
Patient on the phone attempting to find a ride home

## 2018-04-17 NOTE — ED Notes (Signed)
Patient discharged home, patient also given a bus pass, patient received discharge papers. Patient received belongings and verbalized he has received all of his belongings. Patient appropriate and cooperative, Denies SI/HI AVH. Vital signs taken. NAD noted.

## 2018-04-17 NOTE — ED Provider Notes (Signed)
-----------------------------------------   7:40 AM on 04/17/2018 -----------------------------------------   Blood pressure (!) 148/90, pulse (!) 102, temperature 98.3 F (36.8 C), temperature source Oral, resp. rate 20, height 5\' 11"  (1.803 m), weight 77.1 kg, SpO2 98 %.  The patient is calm and cooperative at this time.  There have been no acute events since the last update.  I discussed the patient's presentation with the psychiatrist on-call who has evaluated the patient.  They note they have seen him in the past for similar presentation and feel that today's event was a recreational misadventure and not due to psychosis or serious depression or attempted self-harm.  I find the patient to be psychiatrically stable and not a danger to himself or others.  He is medically stable.  SOC will reverse IVC and recommends discharge to outpatient follow-up.   Sharman Cheek, MD 04/17/18 [id number]

## 2018-04-17 NOTE — ED Notes (Signed)
Hourly rounding reveals patient sleeping in room. No complaints, stable, in no acute distress. Q15 minute rounds and monitoring via Security to continue. 

## 2018-06-11 IMAGING — CT CT CERVICAL SPINE W/O CM
3 of 4 series · 13 of 33 positions shown, 16 images · non-contrast
Comparison: 03/30/2017

CLINICAL DATA: Seizure

EXAM:
CT CERVICAL SPINE WITHOUT CONTRAST
TECHNIQUE: Multidetector CT imaging of the cervical spine was performed without
intravenous contrast. Multiplanar CT image reconstructions were also
generated.

[Series 7: orthogonal bone · axial · 0.23mm/px · z∈[+1200,+1317]mm · 5 of 92 slices shown, 7 images]
[im 16/92  soft-tissue]
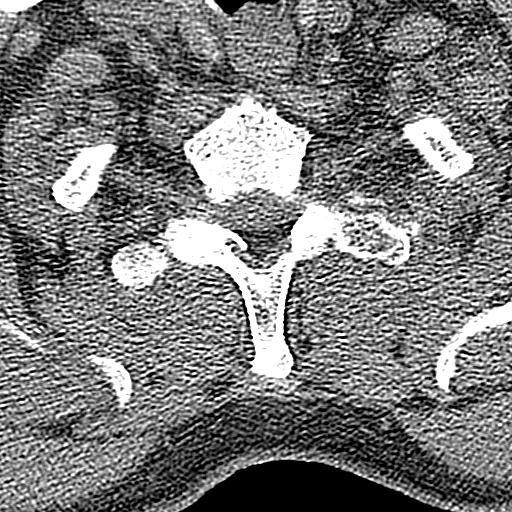
[im 16/92  bone]
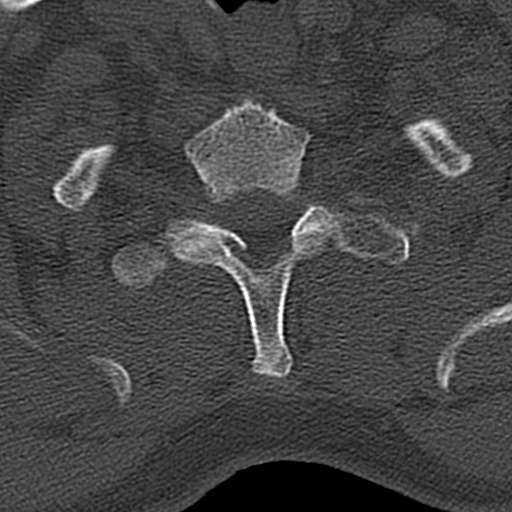
[im 31/92  bone]
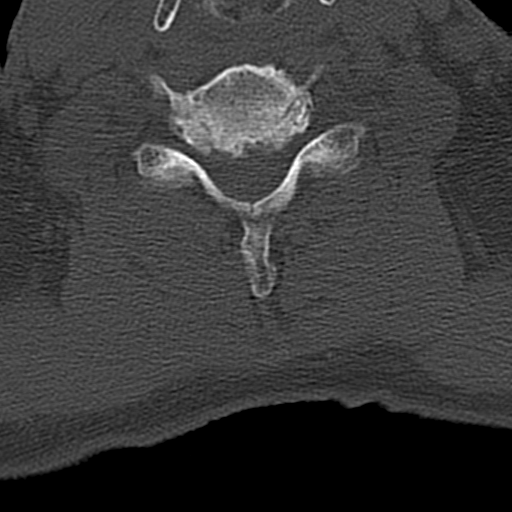
[im 46/92  bone]
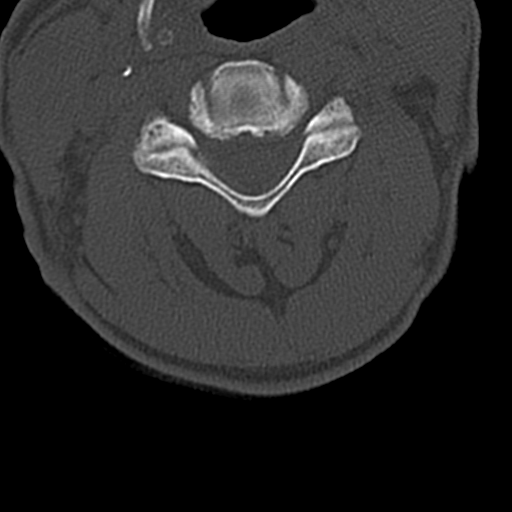
[im 61/92  bone]
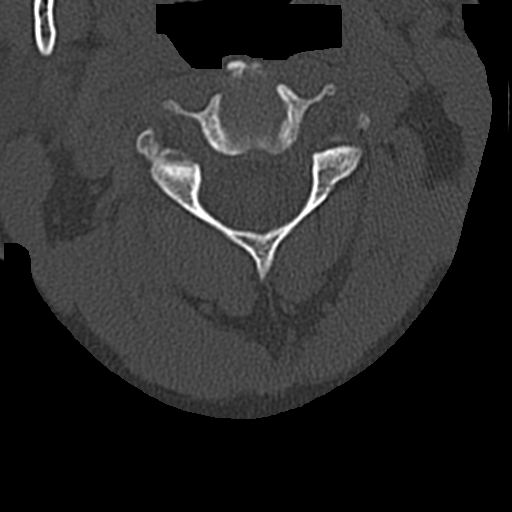
[im 76/92  soft-tissue]
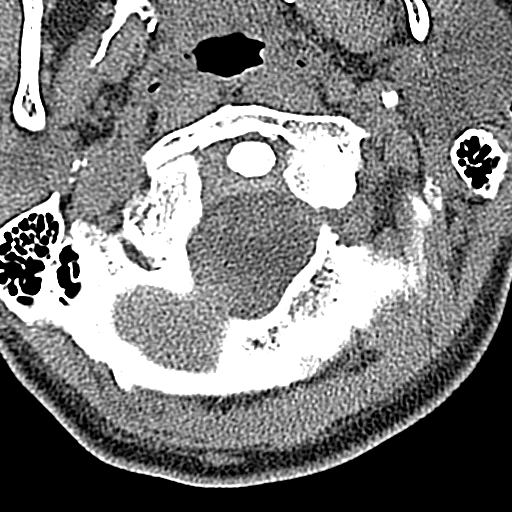
[im 76/92  bone]
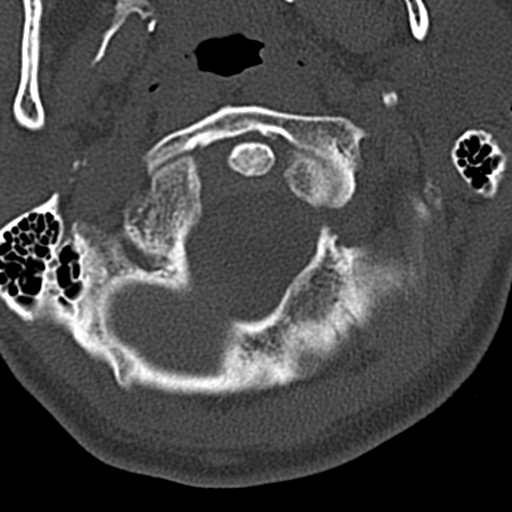

[Series 8: coronal bone · coronal · 0.27mm/px · 3 of 61 slices shown]
[im 13/61  bone]
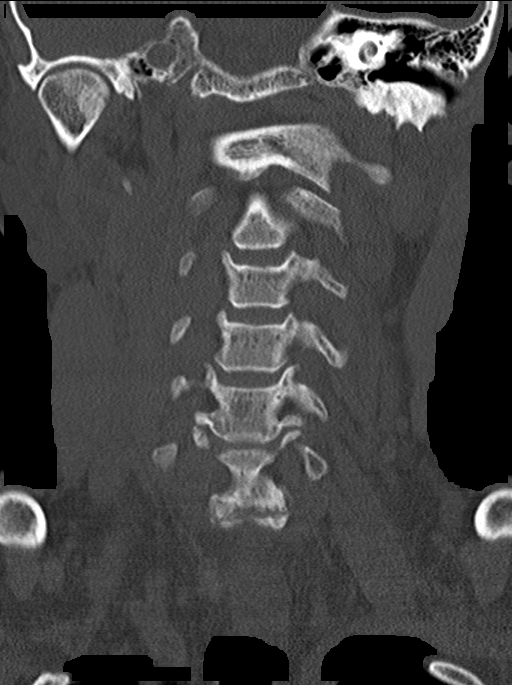
[im 25/61  bone]
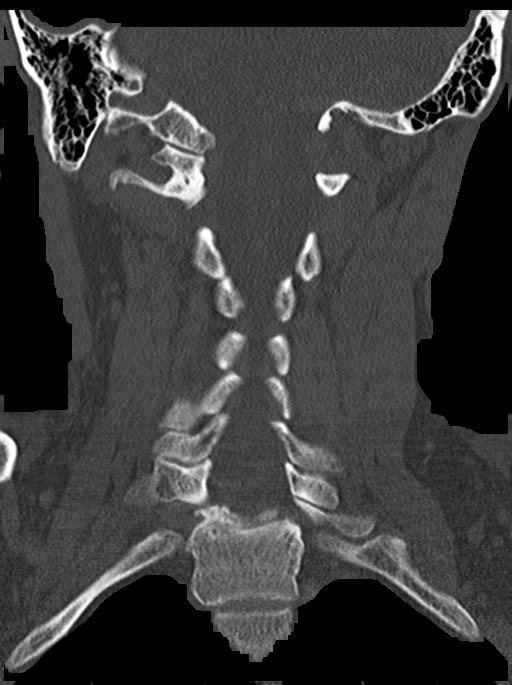
[im 37/61  bone]
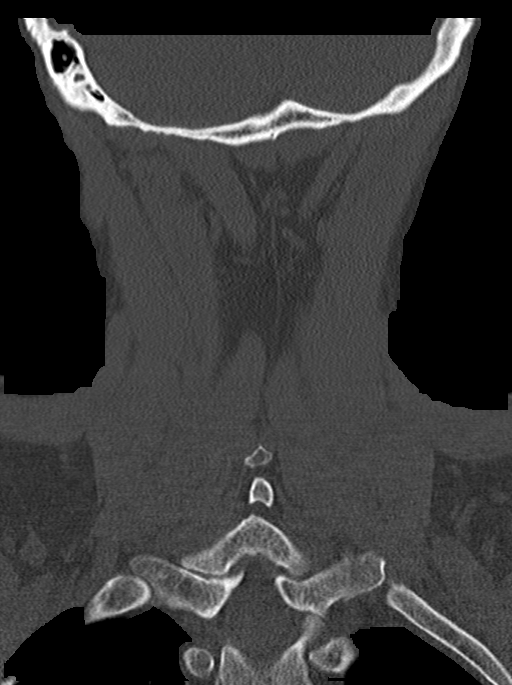

[Series 9: sagittal bone · sagittal · 0.27mm/px · 5 of 61 slices shown, 6 images]
[im 21/61  bone]
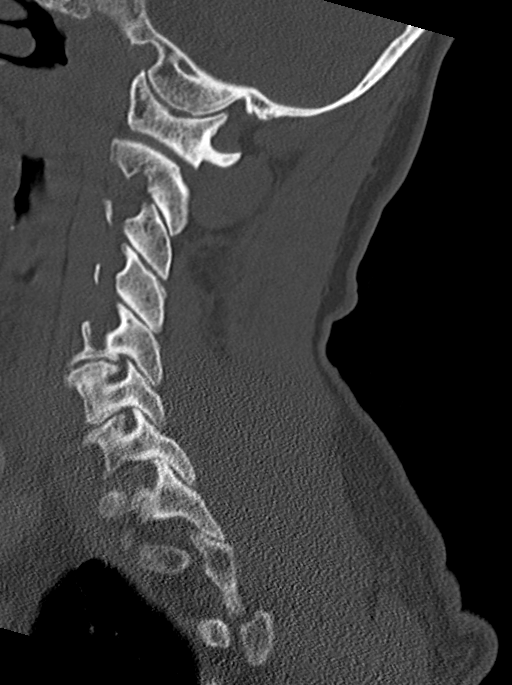
[im 26/61  bone]
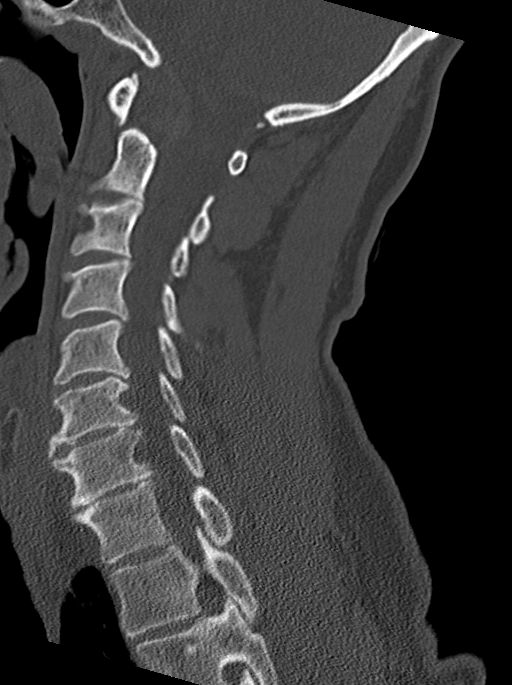
[im 31/61  soft-tissue]
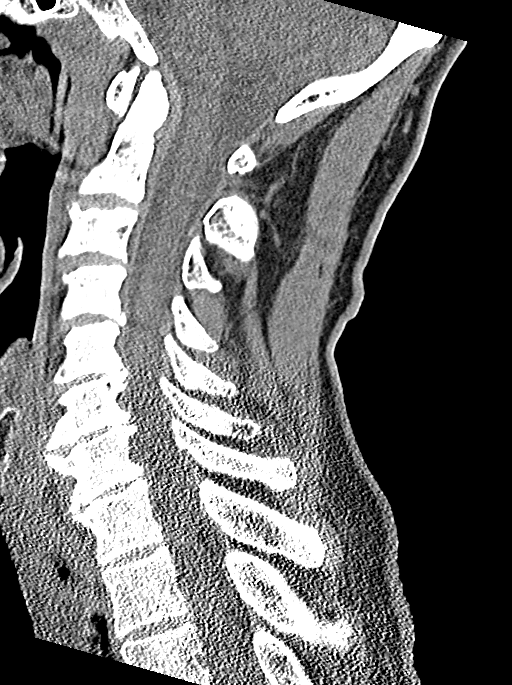
[im 31/61  bone]
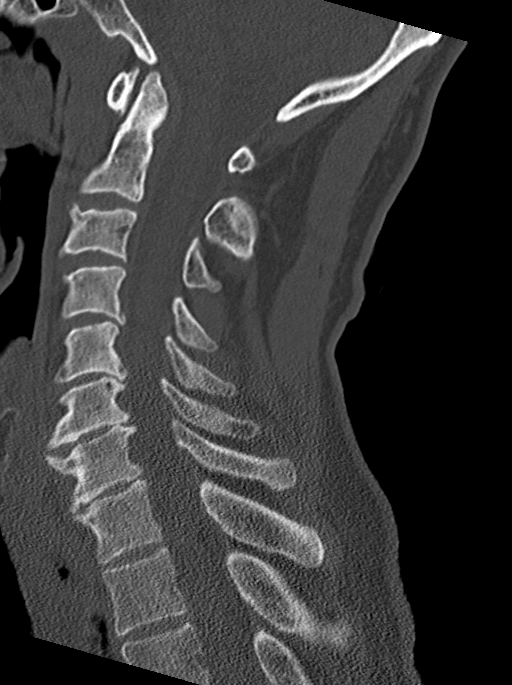
[im 36/61  bone]
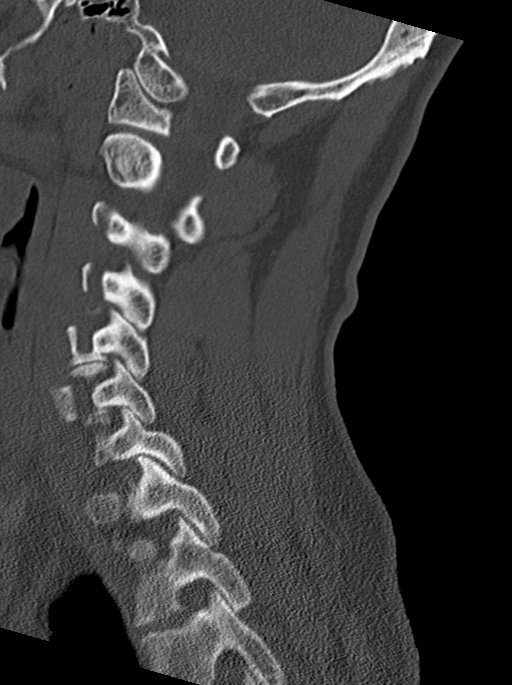
[im 41/61  bone]
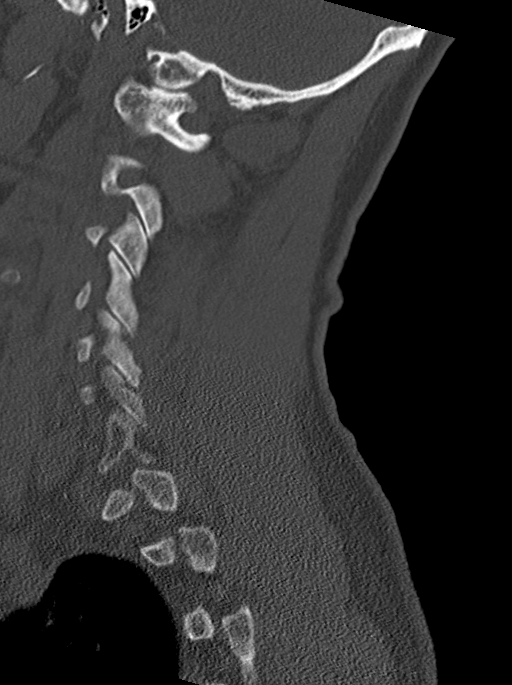

[13 of 33 positions shown; findings below may reference images not displayed]

FINDINGS: Alignment: Cervical lordosis is maintained. Intact craniocervical
relationship. Intact atlantodental interval.

Skull base and vertebrae: No acute fracture. No primary bone lesion
or focal pathologic process.

Soft tissues and spinal canal: No prevertebral fluid or swelling. No
visible canal hematoma.

Disc levels: Mild disc space narrowing from C2 through C5 with
moderate disc space narrowing from C5 through T1. Small posterior
marginal osteophytes are noted from C4 through C7. Uncovertebral
joint osteoarthritis with uncinate spurring from C3-4 through C6-7.
These contribute to bilateral neural foraminal encroachment from C4
through T1, mild-to-moderate. No jumped or perched facets.

Upper chest: No acute abnormality. A few subpleural blebs are noted
at the right lung apex.

Other: None
IMPRESSION: Cervical spondylosis without acute cervical spine fracture or
listhesis.

## 2018-06-11 IMAGING — CT CT HEAD W/O CM
3 series · 15 of 47 positions shown, 18 images · non-contrast
Comparison: 03/30/2017

CLINICAL DATA: Seizure, ethanol intake, history ethanol abuse,
COPD, smoker

EXAM:
CT HEAD WITHOUT CONTRAST
TECHNIQUE: Contiguous axial images were obtained from the base of the skull
through the vertex without intravenous contrast. Sagittal and
coronal MPR images reconstructed from axial data set.

[Series 2: head wo · axial · 0.48mm/px · z∈[+1329,+1464]mm · 9 of 33 slices shown, 12 images]
[im 3/33  brain]
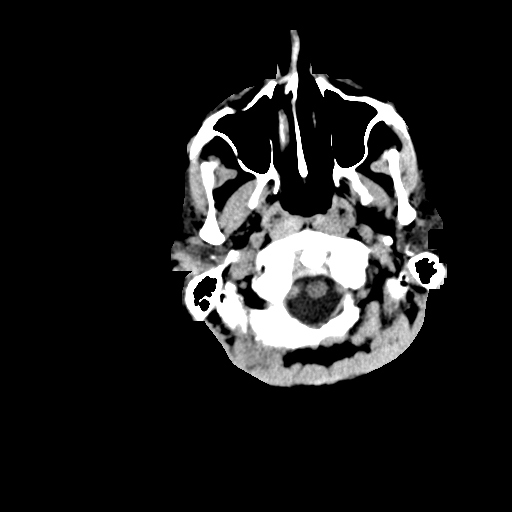
[im 3/33  bone]
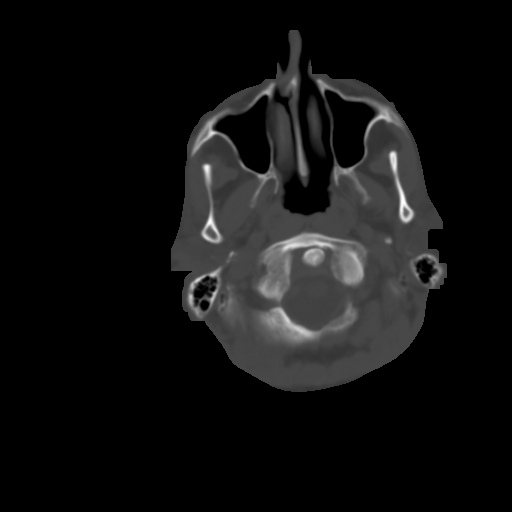
[im 6/33  brain]
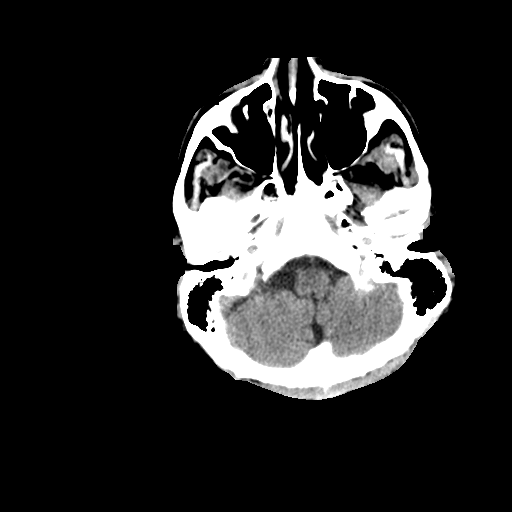
[im 9/33  brain]
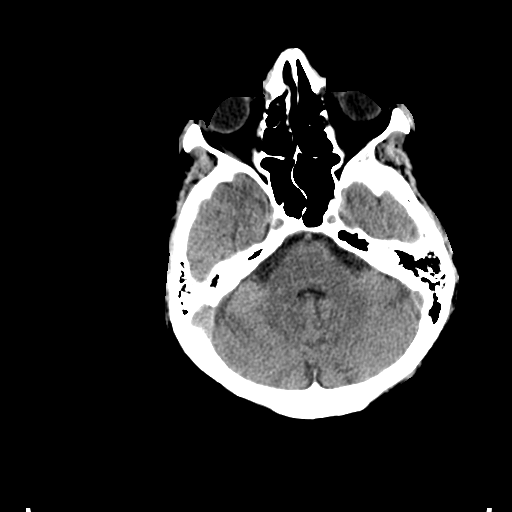
[im 13/33  brain]
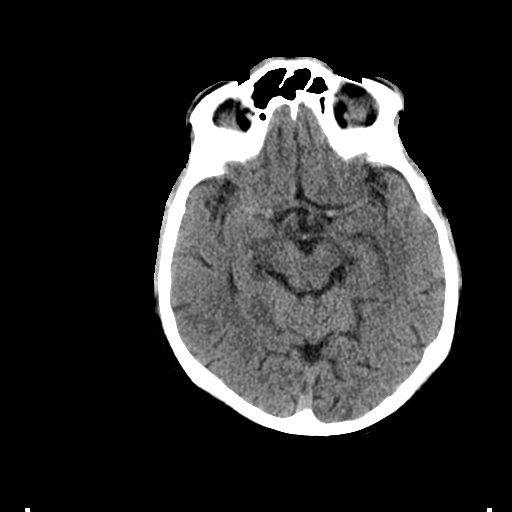
[im 17/33  brain]
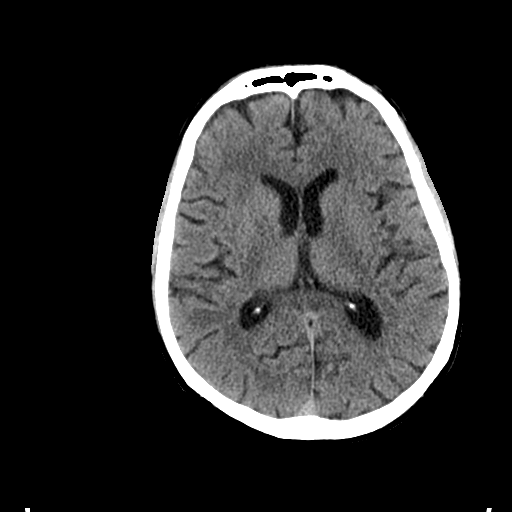
[im 17/33  bone]
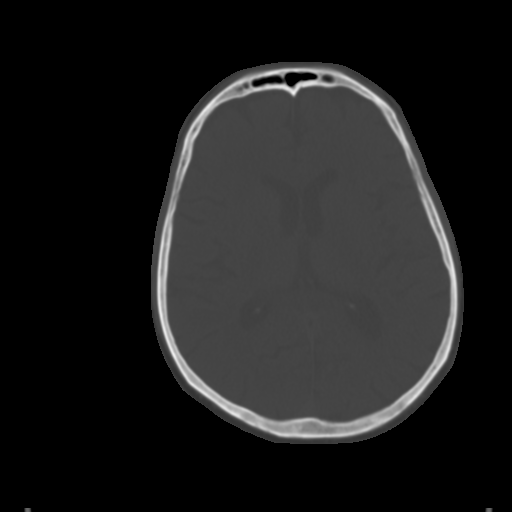
[im 20/33  brain]
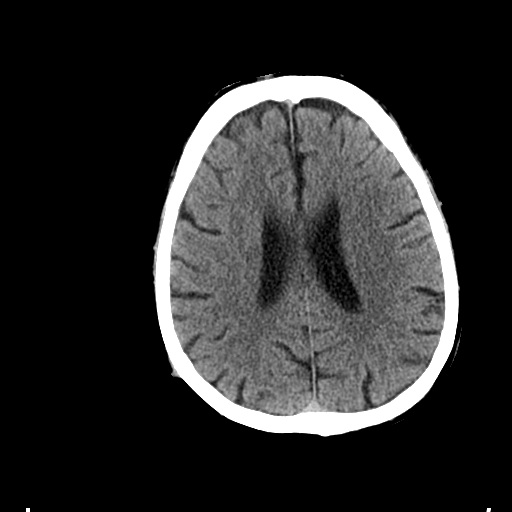
[im 24/33  brain]
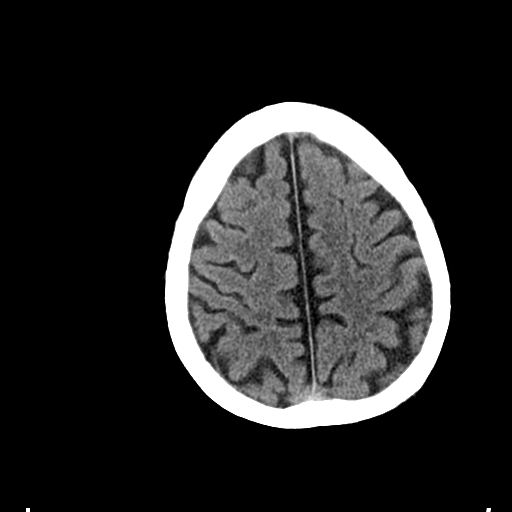
[im 27/33  brain]
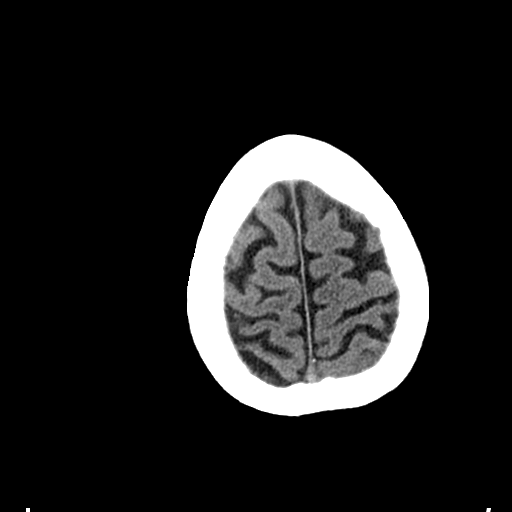
[im 30/33  brain]
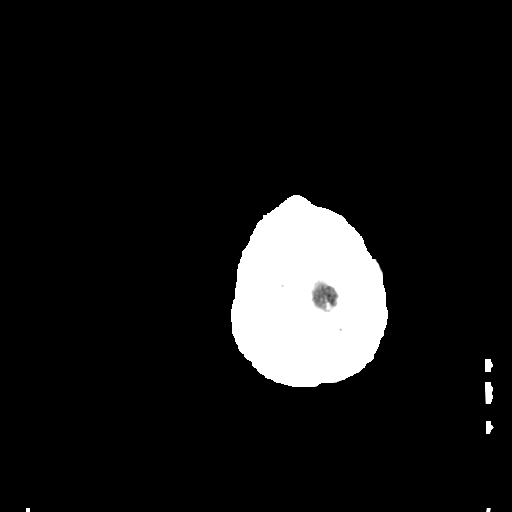
[im 30/33  bone]
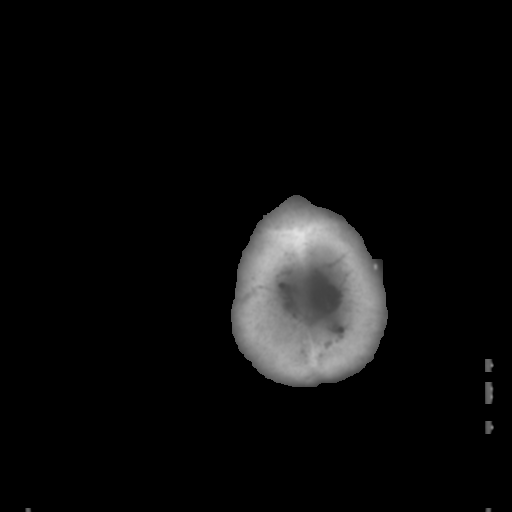

[Series 4: coronal soft tissue · coronal · 0.30mm/px · 3 of 76 slices shown]
[im 26/76  brain]
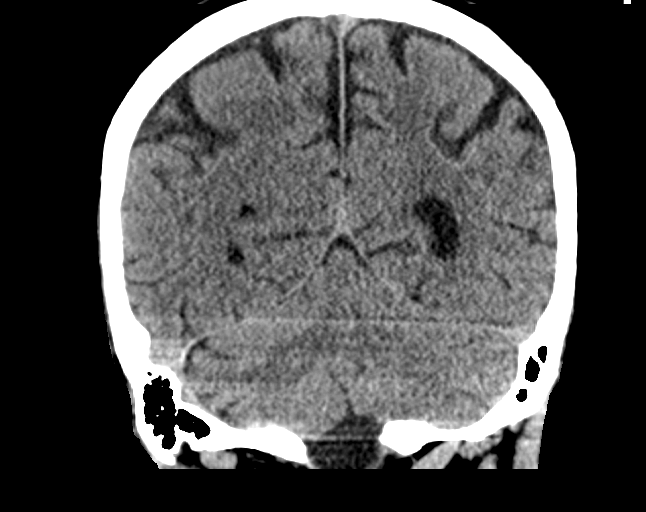
[im 34/76  brain]
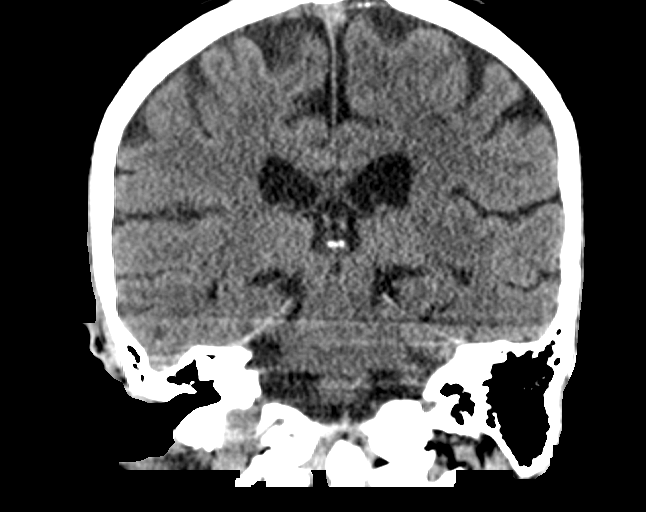
[im 42/76  brain]
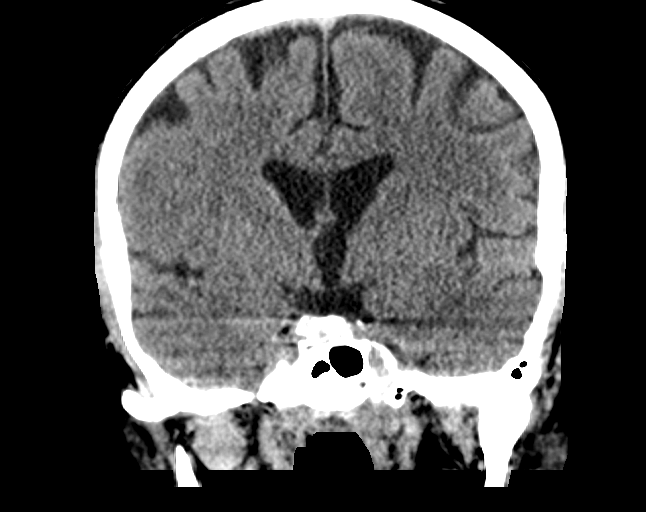

[Series 5: sagittal soft tissue · sagittal · 0.34mm/px · 3 of 67 slices shown]
[im 23/67  brain]
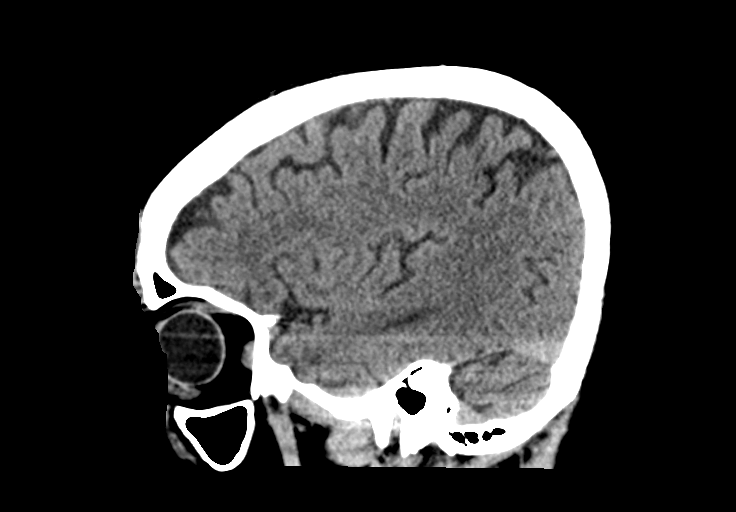
[im 34/67  brain]
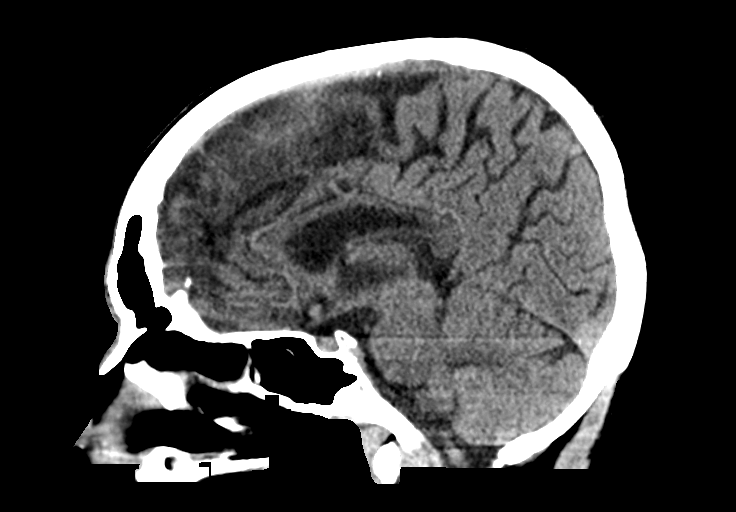
[im 45/67  brain]
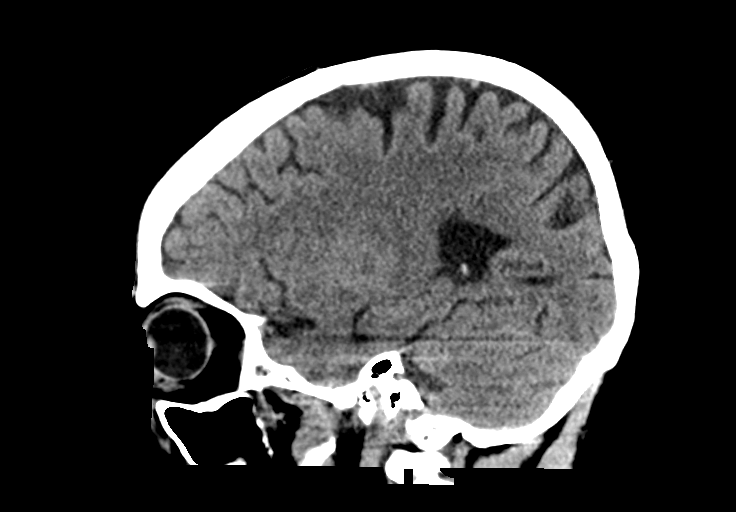

[15 of 47 positions shown; findings below may reference images not displayed]

FINDINGS: Brain: Normal ventricular morphology. No midline shift or mass
effect. Normal appearance of brain parenchyma. No intracranial
hemorrhage, mass lesion, evidence of acute infarction, or
extra-axial fluid collection.

Vascular: Normal appearance

Skull: Intact

Sinuses/Orbits: Clear

Other: N/A
IMPRESSION: Normal exam, unchanged.

## 2018-06-27 ENCOUNTER — Emergency Department: Payer: Medicaid Other

## 2018-06-27 ENCOUNTER — Emergency Department
Admission: EM | Admit: 2018-06-27 | Discharge: 2018-06-27 | Disposition: A | Discharge status: Home / Self Care | Payer: Medicaid Other | Attending: Emergency Medicine | Admitting: Emergency Medicine

## 2018-06-27 ENCOUNTER — Other Ambulatory Visit: Payer: Self-pay

## 2018-06-27 ENCOUNTER — Encounter: Payer: Self-pay | Admitting: Emergency Medicine

## 2018-06-27 DIAGNOSIS — R197 Diarrhea, unspecified: Secondary | ICD-10-CM | POA: Diagnosis not present

## 2018-06-27 DIAGNOSIS — Z79899 Other long term (current) drug therapy: Secondary | ICD-10-CM | POA: Insufficient documentation

## 2018-06-27 DIAGNOSIS — F17210 Nicotine dependence, cigarettes, uncomplicated: Secondary | ICD-10-CM | POA: Diagnosis not present

## 2018-06-27 DIAGNOSIS — R112 Nausea with vomiting, unspecified: Secondary | ICD-10-CM

## 2018-06-27 DIAGNOSIS — I10 Essential (primary) hypertension: Secondary | ICD-10-CM | POA: Insufficient documentation

## 2018-06-27 LAB — CBC WITH DIFFERENTIAL/PLATELET
Abs Immature Granulocytes: 0.02 10*3/uL (ref 0.00–0.07)
Basophils Absolute: 0.1 10*3/uL (ref 0.0–0.1)
Basophils Relative: 1 %
Eosinophils Absolute: 0 10*3/uL (ref 0.0–0.5)
Eosinophils Relative: 0 %
HCT: 48.8 % (ref 39.0–52.0)
Hemoglobin: 16 g/dL (ref 13.0–17.0)
Immature Granulocytes: 0 %
Lymphocytes Relative: 14 %
Lymphs Abs: 1.1 10*3/uL (ref 0.7–4.0)
MCH: 30.4 pg (ref 26.0–34.0)
MCHC: 32.8 g/dL (ref 30.0–36.0)
MCV: 92.8 fL (ref 80.0–100.0)
Monocytes Absolute: 0.3 10*3/uL (ref 0.1–1.0)
Monocytes Relative: 4 %
Neutro Abs: 6.3 10*3/uL (ref 1.7–7.7)
Neutrophils Relative %: 81 %
Platelets: 227 10*3/uL (ref 150–400)
RBC: 5.26 MIL/uL (ref 4.22–5.81)
RDW: 14.1 % (ref 11.5–15.5)
WBC: 7.8 10*3/uL (ref 4.0–10.5)
nRBC: 0 % (ref 0.0–0.2)

## 2018-06-27 LAB — URINALYSIS, COMPLETE (UACMP) WITH MICROSCOPIC
Bacteria, UA: NONE SEEN
Bilirubin Urine: NEGATIVE
Glucose, UA: NEGATIVE mg/dL
Hgb urine dipstick: NEGATIVE
Ketones, ur: 20 mg/dL — AB
Leukocytes,Ua: NEGATIVE
Nitrite: NEGATIVE
Protein, ur: NEGATIVE mg/dL
Specific Gravity, Urine: 1.041 — ABNORMAL HIGH (ref 1.005–1.030)
Squamous Epithelial / HPF: NONE SEEN (ref 0–5)
pH: 5 (ref 5.0–8.0)

## 2018-06-27 LAB — URINE DRUG SCREEN, QUALITATIVE (ARMC ONLY)
Amphetamines, Ur Screen: NOT DETECTED
Barbiturates, Ur Screen: NOT DETECTED
Benzodiazepine, Ur Scrn: NOT DETECTED
Cannabinoid 50 Ng, Ur ~~LOC~~: POSITIVE — AB
Cocaine Metabolite,Ur ~~LOC~~: NOT DETECTED
MDMA (Ecstasy)Ur Screen: NOT DETECTED
Methadone Scn, Ur: NOT DETECTED
Opiate, Ur Screen: NOT DETECTED
Phencyclidine (PCP) Ur S: NOT DETECTED
Tricyclic, Ur Screen: POSITIVE — AB

## 2018-06-27 LAB — TROPONIN I: Troponin I: <0.03 ng/mL (ref <0.03)

## 2018-06-27 LAB — BASIC METABOLIC PANEL
Anion gap: 12 (ref 5–15)
BUN: 15 mg/dL (ref 6–20)
CO2: 22 mmol/L (ref 22–32)
Calcium: 8.6 mg/dL — ABNORMAL LOW (ref 8.9–10.3)
Chloride: 104 mmol/L (ref 98–111)
Creatinine, Ser: 0.75 mg/dL (ref 0.61–1.24)
GFR calc Af Amer: >60 mL/min (ref >60)
GFR calc non Af Amer: >60 mL/min (ref >60)
Glucose, Bld: 114 mg/dL — ABNORMAL HIGH (ref 70–99)
Potassium: 3.9 mmol/L (ref 3.5–5.1)
Sodium: 138 mmol/L (ref 135–145)

## 2018-06-27 LAB — ETHANOL: Alcohol, Ethyl (B): <10 mg/dL (ref <10)

## 2018-06-27 LAB — LIPASE, BLOOD: Lipase: 23 U/L (ref 11–51)

## 2018-06-27 MED ORDER — LIDOCAINE VISCOUS HCL 2 % MT SOLN
15.0000 mL | Freq: Once | OROMUCOSAL | Status: AC
  Administered 2018-06-27: 18:00:00 15 mL via ORAL
  Filled 2018-06-27: qty 15

## 2018-06-27 MED ORDER — ONDANSETRON HCL 4 MG/2ML IJ SOLN
4.0000 mg | Freq: Once | INTRAMUSCULAR | Status: AC
  Administered 2018-06-27: 15:00:00 4 mg via INTRAVENOUS
  Filled 2018-06-27: qty 2

## 2018-06-27 MED ORDER — HALOPERIDOL LACTATE 5 MG/ML IJ SOLN
5.0000 mg | Freq: Once | INTRAMUSCULAR | Status: AC
  Administered 2018-06-27: 5 mg via INTRAVENOUS
  Filled 2018-06-27: qty 1

## 2018-06-27 MED ORDER — METOCLOPRAMIDE HCL 5 MG/ML IJ SOLN
10.0000 mg | Freq: Once | INTRAMUSCULAR | Status: AC
  Administered 2018-06-27: 16:00:00 10 mg via INTRAVENOUS
  Filled 2018-06-27: qty 2

## 2018-06-27 MED ORDER — ALUM & MAG HYDROXIDE-SIMETH 200-200-20 MG/5ML PO SUSP
30.0000 mL | Freq: Once | ORAL | Status: AC
  Administered 2018-06-27: 18:00:00 30 mL via ORAL
  Filled 2018-06-27: qty 30

## 2018-06-27 MED ORDER — LISINOPRIL 10 MG PO TABS: 10.0000 mg | ORAL_TABLET | Freq: Once | ORAL | Status: DC

## 2018-06-27 MED ORDER — IOHEXOL 240 MG/ML SOLN
50.0000 mL | Freq: Once | INTRAMUSCULAR | Status: AC
  Administered 2018-06-27: 16:00:00 50 mL via ORAL

## 2018-06-27 MED ORDER — CLONIDINE HCL 0.1 MG PO TABS
0.1000 mg | ORAL_TABLET | Freq: Once | ORAL | Status: AC
  Administered 2018-06-27: 0.1 mg via ORAL
  Filled 2018-06-27: qty 1

## 2018-06-27 MED ORDER — IOHEXOL 300 MG/ML  SOLN
100.0000 mL | Freq: Once | INTRAMUSCULAR | Status: AC | PRN
  Administered 2018-06-27: 16:00:00 100 mL via INTRAVENOUS

## 2018-06-27 MED ORDER — SODIUM CHLORIDE 0.9 % IV BOLUS
1000.0000 mL | Freq: Once | INTRAVENOUS | Status: AC
  Administered 2018-06-27: 1000 mL via INTRAVENOUS

## 2018-06-27 NOTE — ED Provider Notes (Signed)
[facility] Emergency Department Provider Note ____________________________________________  Time seen: 1419  I have reviewed the triage vital signs and the nursing notes.  HISTORY  Chief Complaint  Emesis  HPI Rodney Watson is a 53 y.o. male resents himself to the ED via EMS from home, with complaints of nausea, vomiting, and diarrhea.  Patient describes he has had diarrhea for the last "few days" and he reports some nausea and vomited a started today.  He denies any abdominal pain, fevers, chills, sweats, chest pain, or shortness of breath.  Has a history of COPD, depression, alcohol abuse, and seizures.  He reports that he has taken all of his prescription medications today, prior to the onset of the persistent vomiting.  Past Medical History:  Diagnosis Date  . Alcohol abuse   . Alcohol abuse 04/01/2017  . COPD (chronic obstructive pulmonary disease) (HCC)   . Crack cocaine use   . Depression   . ETOH abuse   . Seizures [facility])     Patient Active Problem List   Diagnosis Date Noted  . Severe recurrent major depression without psychotic features (HCC) 10/25/2017  . Suicide attempt (HCC) 10/24/2017  . Major depressive disorder, recurrent episode, severe (HCC) 09/29/2017  . Alcohol withdrawal (HCC) 07/18/2017  . Seizure disorder (HCC) 07/18/2017  . Essential hypertension 07/18/2017  . Alcohol abuse with alcohol-induced mood disorder (HCC) 04/10/2017    Past Surgical History:  Procedure Laterality Date  . BACK SURGERY      Prior to Admission medications   Medication Sig Start Date End Date Taking? Authorizing Provider  FLUoxetine (PROZAC) 20 MG capsule Take 1 capsule (20 mg total) by mouth daily. 10/29/17   McNew, Ileene Hutchinson, MD  gabapentin (NEURONTIN) 300 MG capsule Take 1 capsule (300 mg total) by mouth 3 (three) times daily. 10/29/17   McNew, Ileene Hutchinson, MD  levothyroxine (SYNTHROID, LEVOTHROID) 25 MCG tablet Take 1 tablet (25 mcg total) by mouth daily  before breakfast. 10/29/17   McNew, Ileene Hutchinson, MD  lisinopril (PRINIVIL,ZESTRIL) 10 MG tablet Take 1 tablet (10 mg total) by mouth daily. For high blood pressure 10/29/17   McNew, Ileene Hutchinson, MD  phenytoin (DILANTIN) 100 MG ER capsule Take 1 capsule (100 mg total) by mouth 3 (three) times daily. 01/23/18 02/22/18  Ihor Austin, MD  QUEtiapine (SEROQUEL) 200 MG tablet Take 1 tablet (200 mg total) by mouth at bedtime. 10/29/17   McNew, Ileene Hutchinson, MD    Allergies Chocolate and Vicodin [hydrocodone-acetaminophen]  Family History  Problem Relation Age of Onset  . Alcohol abuse Father   . Seizures Neg Hx     Social History Social History   Tobacco Use  . Smoking status: Current Every Day Smoker    Packs/day: 1.50    Years: 39.00    Pack years: 58.50    Types: Cigarettes  . Smokeless tobacco: Current User    Types: Chew  Substance Use Topics  . Alcohol use: Yes    Comment: 5th daily  . Drug use: Yes    Types: Marijuana, Cocaine    Comment: 1-2 joints/week    Review of Systems  Constitutional: Negative for fever. Eyes: Negative for visual changes. ENT: Negative for sore throat. Cardiovascular: Negative for chest pain. Respiratory: Negative for shortness of breath. Gastrointestinal: Negative for abdominal pain. Reports vomiting and diarrhea. Genitourinary: Negative for dysuria. Musculoskeletal: Negative for back pain. Skin: Negative for rash. Neurological: Negative for headaches, focal weakness or numbness. ____________________________________________  PHYSICAL EXAM:  VITAL SIGNS:  ED Triage Vitals [06/27/18 1415]  Enc Vitals Group     BP (!) 192/102     Pulse Rate 61     Resp 16     Temp 98.1 F (36.7 C)     Temp Source Oral     SpO2 100 %     Weight 120 lb (54.4 kg)     Height 5\' 8"  (1.727 m)     Head Circumference      Peak Flow      Pain Score 0     Pain Loc      Pain Edu?      Excl. in GC?     Constitutional: Alert and oriented. Well appearing and in no  distress. Head: Normocephalic and atraumatic. Eyes: Conjunctivae are normal. Normal extraocular movements Cardiovascular: Normal rate, regular rhythm. Normal distal pulses. Respiratory: Normal respiratory effort. No wheezes/rales/rhonchi. Gastrointestinal: Soft, flat, and nontender. No distention. Active, self-induced vomiting during the exam. Patient is actively gagging himself with his fingers, to induce vomiting. Musculoskeletal: Nontender with normal range of motion in all extremities.  Neurologic:  Normal gait without ataxia. Normal speech and language. No gross focal neurologic deficits are appreciated. Skin:  Skin is warm, dry and intact. No rash noted. Psychiatric: Mood and affect are normal. Patient exhibits appropriate insight and judgment. ____________________________________________   LABS (pertinent positives/negatives) Labs Reviewed  BASIC METABOLIC PANEL - Abnormal; Notable for the following components:      Result Value   Glucose, Bld 114 (*)    Calcium 8.6 (*)    All other components within normal limits  URINALYSIS, COMPLETE (UACMP) WITH MICROSCOPIC - Abnormal; Notable for the following components:   Color, Urine YELLOW (*)    APPearance CLEAR (*)    Specific Gravity, Urine 1.041 (*)    Ketones, ur 20 (*)    All other components within normal limits  URINE DRUG SCREEN, QUALITATIVE ([facility] ONLY) - Abnormal; Notable for the following components:   Tricyclic, Ur Screen POSITIVE (*)    Cannabinoid 50 Ng, Ur [place] POSITIVE (*)    All other components within normal limits  CBC WITH DIFFERENTIAL/PLATELET  ETHANOL  LIPASE, BLOOD  TROPONIN I  ____________________________________________   RADIOLOGY  CT ABD/Pelvis w/ CM  IMPRESSION: 1. Thin linear density in a distal small bowel loop in the right side of the pelvis which could represent a fish bone in the bowel. No appreciable inflammation of the adjacent soft tissues or evidence of abscess or perforation. 2. Extensive  aortic atherosclerosis. ____________________________________________  PROCEDURES  Procedures NS 1000 ml IV Zofran 4 mg IVP Reglan 10 mg IVP Clonidine 0.1 mg PO ____________________________________________  INITIAL IMPRESSION / ASSESSMENT AND PLAN / ED COURSE  Rodney Watson was evaluated in Emergency Department on 06/27/2018 for the symptoms described in the history of present illness. He was evaluated in the context of the global COVID-19 pandemic, which necessitated consideration that the patient might be at risk for infection with the SARS-CoV-2 virus that causes COVID-19. Institutional protocols and algorithms that pertain to the evaluation of patients at risk for COVID-19 are in a state of rapid change based on information released by regulatory bodies including the CDC and federal and state organizations. These policies and algorithms were followed during the patient's care in the ED.  Patient is a 53 y.o. male who presents to the ED for several days of diarrhea and 1 day of nausea and vomiting.  He describes nonbloody, nonbilious vomitus and persistent diarrhea. Patient has not  had any spontaneous vomiting. He has had only self-induced vomiting in the ED.   ----------------------------------------- 5:15 PM on 06/27/2018 -----------------------------------------  S/w Dr. Tonna BoehringerSakai regarding patient and CT results. He is unclear of the cause for ongoing nausea and vomiting since suspected fishbone is well into the cecum. He will await consult if admission is warranted. No urgent surgical intervention suspected at this time.  ----------------------------------------- 6:22 PM on 06/27/2018 -----------------------------------------  Patient reassessed after CT scan. He is resting comfortably without witnessed nausea or (self-induced) vomiting. He is without respiratory distress, dehydrations, seizure activity, or signs of an acute coronary syndrome. He has been given an IV dose of haloperidol  and a GI cocktail. He blood pressures have normalized. He is requesting discharge home at this time. He will be discharged with instructions to follow-up with his provider or return for ongoing/worsening symptoms.  ____________________________________________  FINAL CLINICAL IMPRESSION(S) / ED DIAGNOSES  Final diagnoses:  Nausea vomiting and diarrhea      Karmen StabsMenshew, Charlesetta IvoryJenise V Bacon, PA-C 06/27/18 1827    Jeanmarie PlantMcShane, James A, MD 06/27/18 2235

## 2018-06-27 NOTE — ED Triage Notes (Addendum)
PT arrives via ems with complaints of n/v/d. Pt reports diarrhea has been present "for a few days." PT states the nausea and vomiting started today.

## 2018-06-27 NOTE — Discharge Instructions (Signed)
Follow-up with one of the community clinics for routine medical care. Take your home medicines as directed.

## 2018-06-27 NOTE — ED Notes (Signed)
Patient transported to CT 

## 2018-06-27 NOTE — ED Provider Notes (Signed)
-----------------------------------------   5:34 PM on 06/27/2018 -----------------------------------------  Well-appearing gentleman, self-induced vomiting, has no evidence of vomiting here otherwise, CT and blood work are reassuring blood pressure was elevated is coming down, has been elevated like this before.  Patient does have a history of EtOH and polysubstance abuse.  Alcohol is low.  No evidence at this time of clinical withdrawal however.  Not tachycardic not tremulous.  Patient expresses skepticism that we will stop him from throwing up but we have suggested that he not try to make himself throw up.  His belly is nonsurgical, we did have surgery evaluate him for the questionable fishbone in the distal bowels which does not seem to be causing him any trouble if it is actually there.  I will give him Haldol as some of this certainly seems to be behavioral.  He has no SI or HI, and his abdomen is nonsurgical.   Jeanmarie Plant, MD 06/27/18 1735

## 2018-06-27 NOTE — ED Notes (Signed)
Pt discovered attempting to induce vomiting by putting fingers into throat. Pt discouraged strongly from self-inducing vomiting.

## 2018-06-29 ENCOUNTER — Inpatient Hospital Stay
Admission: EM | Admit: 2018-06-29 | Discharge: 2018-07-02 | DRG: 101 | Disposition: A | Discharge status: Home / Self Care | Payer: Medicaid Other | Attending: Internal Medicine | Admitting: Internal Medicine

## 2018-06-29 DIAGNOSIS — F1010 Alcohol abuse, uncomplicated: Secondary | ICD-10-CM | POA: Diagnosis present

## 2018-06-29 DIAGNOSIS — F17210 Nicotine dependence, cigarettes, uncomplicated: Secondary | ICD-10-CM | POA: Diagnosis present

## 2018-06-29 DIAGNOSIS — Z79890 Hormone replacement therapy (postmenopausal): Secondary | ICD-10-CM

## 2018-06-29 DIAGNOSIS — F329 Major depressive disorder, single episode, unspecified: Secondary | ICD-10-CM | POA: Diagnosis present

## 2018-06-29 DIAGNOSIS — Z79899 Other long term (current) drug therapy: Secondary | ICD-10-CM

## 2018-06-29 DIAGNOSIS — F1410 Cocaine abuse, uncomplicated: Secondary | ICD-10-CM | POA: Diagnosis present

## 2018-06-29 DIAGNOSIS — F1210 Cannabis abuse, uncomplicated: Secondary | ICD-10-CM | POA: Diagnosis present

## 2018-06-29 DIAGNOSIS — G40909 Epilepsy, unspecified, not intractable, without status epilepticus: Secondary | ICD-10-CM

## 2018-06-29 DIAGNOSIS — I10 Essential (primary) hypertension: Secondary | ICD-10-CM | POA: Diagnosis present

## 2018-06-29 DIAGNOSIS — E876 Hypokalemia: Secondary | ICD-10-CM | POA: Diagnosis present

## 2018-06-29 DIAGNOSIS — R110 Nausea: Secondary | ICD-10-CM | POA: Diagnosis not present

## 2018-06-29 DIAGNOSIS — G4089 Other seizures: Principal | ICD-10-CM | POA: Diagnosis present

## 2018-06-29 DIAGNOSIS — J449 Chronic obstructive pulmonary disease, unspecified: Secondary | ICD-10-CM | POA: Diagnosis present

## 2018-06-29 DIAGNOSIS — Z1159 Encounter for screening for other viral diseases: Secondary | ICD-10-CM

## 2018-06-29 DIAGNOSIS — R569 Unspecified convulsions: Secondary | ICD-10-CM

## 2018-06-29 DIAGNOSIS — I674 Hypertensive encephalopathy: Secondary | ICD-10-CM | POA: Diagnosis present

## 2018-06-29 DIAGNOSIS — Z91018 Allergy to other foods: Secondary | ICD-10-CM

## 2018-06-29 DIAGNOSIS — E039 Hypothyroidism, unspecified: Secondary | ICD-10-CM | POA: Diagnosis present

## 2018-06-29 DIAGNOSIS — Z811 Family history of alcohol abuse and dependence: Secondary | ICD-10-CM

## 2018-06-29 DIAGNOSIS — Z885 Allergy status to narcotic agent status: Secondary | ICD-10-CM

## 2018-06-29 DIAGNOSIS — I160 Hypertensive urgency: Secondary | ICD-10-CM | POA: Diagnosis present

## 2018-06-29 NOTE — ED Triage Notes (Signed)
Pt presents via EMS. Pt found near [employer], presumed homeless. Pt has hx of seizures and suspected of having a seizure. Pt is poorly responsive to verbal and is difficult to rouse. Pt was administered narcan for pinpoint pupils and somnolence. Pt is intermittently physically aggressive toward staff and attempting to pull out lines. Pt's pupils remain pinpoint and sluggish. Pt is able to follow commands, but does not always choose to do so. Pt made several attempts to get out of bed but fell back into sleep upon verbal and physical redirection.

## 2018-06-30 ENCOUNTER — Encounter: Payer: Self-pay | Admitting: *Deleted

## 2018-06-30 ENCOUNTER — Emergency Department: Payer: Medicaid Other

## 2018-06-30 ENCOUNTER — Other Ambulatory Visit: Payer: Self-pay

## 2018-06-30 DIAGNOSIS — R569 Unspecified convulsions: Secondary | ICD-10-CM

## 2018-06-30 LAB — CBC WITH DIFFERENTIAL/PLATELET
Abs Immature Granulocytes: 0.03 10*3/uL (ref 0.00–0.07)
Basophils Absolute: 0 10*3/uL (ref 0.0–0.1)
Basophils Relative: 1 %
Eosinophils Absolute: 0.1 10*3/uL (ref 0.0–0.5)
Eosinophils Relative: 1 %
HCT: 40.3 % (ref 39.0–52.0)
Hemoglobin: 13.7 g/dL (ref 13.0–17.0)
Immature Granulocytes: 1 %
Lymphocytes Relative: 25 %
Lymphs Abs: 1.3 10*3/uL (ref 0.7–4.0)
MCH: 30.1 pg (ref 26.0–34.0)
MCHC: 34 g/dL (ref 30.0–36.0)
MCV: 88.6 fL (ref 80.0–100.0)
Monocytes Absolute: 0.4 10*3/uL (ref 0.1–1.0)
Monocytes Relative: 7 %
Neutro Abs: 3.5 10*3/uL (ref 1.7–7.7)
Neutrophils Relative %: 65 %
Platelets: 176 10*3/uL (ref 150–400)
RBC: 4.55 MIL/uL (ref 4.22–5.81)
RDW: 14.1 % (ref 11.5–15.5)
Smear Review: NORMAL
WBC: 5.4 10*3/uL (ref 4.0–10.5)
nRBC: 0 % (ref 0.0–0.2)

## 2018-06-30 LAB — COMPREHENSIVE METABOLIC PANEL
ALT: 25 U/L (ref 0–44)
AST: 23 U/L (ref 15–41)
Albumin: 4.2 g/dL (ref 3.5–5.0)
Alkaline Phosphatase: 69 U/L (ref 38–126)
Anion gap: 7 (ref 5–15)
BUN: 20 mg/dL (ref 6–20)
CO2: 22 mmol/L (ref 22–32)
Calcium: 7.6 mg/dL — ABNORMAL LOW (ref 8.9–10.3)
Chloride: 107 mmol/L (ref 98–111)
Creatinine, Ser: 0.73 mg/dL (ref 0.61–1.24)
GFR calc Af Amer: >60 mL/min (ref >60)
GFR calc non Af Amer: >60 mL/min (ref >60)
Glucose, Bld: 118 mg/dL — ABNORMAL HIGH (ref 70–99)
Potassium: 3.1 mmol/L — ABNORMAL LOW (ref 3.5–5.1)
Sodium: 136 mmol/L (ref 135–145)
Total Bilirubin: 0.3 mg/dL (ref 0.3–1.2)
Total Protein: 6.4 g/dL — ABNORMAL LOW (ref 6.5–8.1)

## 2018-06-30 LAB — URINALYSIS, COMPLETE (UACMP) WITH MICROSCOPIC
Bacteria, UA: NONE SEEN
Bilirubin Urine: NEGATIVE
Glucose, UA: NEGATIVE mg/dL
Hgb urine dipstick: NEGATIVE
Ketones, ur: NEGATIVE mg/dL
Leukocytes,Ua: NEGATIVE
Nitrite: NEGATIVE
Protein, ur: NEGATIVE mg/dL
Specific Gravity, Urine: 1.021 (ref 1.005–1.030)
Squamous Epithelial / HPF: NONE SEEN (ref 0–5)
pH: 5 (ref 5.0–8.0)

## 2018-06-30 LAB — URINE DRUG SCREEN, QUALITATIVE (ARMC ONLY)
Amphetamines, Ur Screen: NOT DETECTED
Barbiturates, Ur Screen: NOT DETECTED
Benzodiazepine, Ur Scrn: NOT DETECTED
Cannabinoid 50 Ng, Ur ~~LOC~~: POSITIVE — AB
Cocaine Metabolite,Ur ~~LOC~~: NOT DETECTED
MDMA (Ecstasy)Ur Screen: NOT DETECTED
Methadone Scn, Ur: NOT DETECTED
Opiate, Ur Screen: NOT DETECTED
Phencyclidine (PCP) Ur S: NOT DETECTED
Tricyclic, Ur Screen: POSITIVE — AB

## 2018-06-30 LAB — TROPONIN I: Troponin I: <0.03 ng/mL (ref <0.03)

## 2018-06-30 LAB — ACETAMINOPHEN LEVEL
Acetaminophen (Tylenol), Serum: 98 ug/mL — ABNORMAL HIGH (ref 10–30)
Acetaminophen (Tylenol), Serum: 29 ug/mL (ref 10–30)

## 2018-06-30 LAB — SALICYLATE LEVEL
Salicylate Lvl: <7 mg/dL (ref 2.8–30.0)
Salicylate Lvl: <7 mg/dL (ref 2.8–30.0)

## 2018-06-30 LAB — SARS CORONAVIRUS 2 BY RT PCR (HOSPITAL ORDER, PERFORMED IN ~~LOC~~ HOSPITAL LAB): SARS Coronavirus 2: NEGATIVE

## 2018-06-30 LAB — GLUCOSE, CAPILLARY: Glucose-Capillary: 124 mg/dL — ABNORMAL HIGH (ref 70–99)

## 2018-06-30 LAB — PHENYTOIN LEVEL, TOTAL: Phenytoin Lvl: 8 ug/mL — ABNORMAL LOW (ref 10.0–20.0)

## 2018-06-30 LAB — ETHANOL: Alcohol, Ethyl (B): <10 mg/dL (ref <10)

## 2018-06-30 MED ORDER — HYDRALAZINE HCL 20 MG/ML IJ SOLN
10.0000 mg | Freq: Once | INTRAMUSCULAR | Status: AC
  Administered 2018-06-30: 10 mg via INTRAVENOUS
  Filled 2018-06-30: qty 1

## 2018-06-30 MED ORDER — ONDANSETRON HCL 4 MG/2ML IJ SOLN
4.0000 mg | Freq: Four times a day (QID) | INTRAMUSCULAR | Status: DC | PRN
  Administered 2018-07-01 (×2): 4 mg via INTRAVENOUS
  Filled 2018-06-30 (×2): qty 2

## 2018-06-30 MED ORDER — THIAMINE HCL 100 MG/ML IJ SOLN: 100.0000 mg | Freq: Every day | INTRAMUSCULAR | Status: DC

## 2018-06-30 MED ORDER — LEVOTHYROXINE SODIUM 25 MCG PO TABS
25.0000 ug | ORAL_TABLET | Freq: Every day | ORAL | Status: DC
  Administered 2018-07-01 – 2018-07-02 (×2): 25 ug via ORAL
  Filled 2018-06-30 (×2): qty 1

## 2018-06-30 MED ORDER — LORAZEPAM 2 MG/ML IJ SOLN
1.0000 mg | Freq: Four times a day (QID) | INTRAMUSCULAR | Status: DC | PRN
  Administered 2018-06-30: 1 mg via INTRAVENOUS
  Filled 2018-06-30 (×2): qty 1

## 2018-06-30 MED ORDER — CHLORHEXIDINE GLUCONATE 0.12 % MT SOLN
15.0000 mL | Freq: Two times a day (BID) | OROMUCOSAL | Status: DC
  Filled 2018-06-30: qty 15

## 2018-06-30 MED ORDER — HYDRALAZINE HCL 20 MG/ML IJ SOLN
10.0000 mg | Freq: Once | INTRAMUSCULAR | Status: AC
  Administered 2018-06-30: 10 mg via INTRAVENOUS

## 2018-06-30 MED ORDER — NICOTINE 21 MG/24HR TD PT24
21.0000 mg | MEDICATED_PATCH | Freq: Every day | TRANSDERMAL | Status: DC
  Administered 2018-07-01 – 2018-07-02 (×2): 21 mg via TRANSDERMAL
  Filled 2018-06-30 (×4): qty 1

## 2018-06-30 MED ORDER — POTASSIUM CHLORIDE CRYS ER 20 MEQ PO TBCR
40.0000 meq | EXTENDED_RELEASE_TABLET | Freq: Two times a day (BID) | ORAL | Status: DC
  Administered 2018-06-30 – 2018-07-02 (×4): 40 meq via ORAL
  Filled 2018-06-30 (×2): qty 2
  Filled 2018-06-30: qty 4
  Filled 2018-06-30 (×2): qty 2

## 2018-06-30 MED ORDER — SODIUM CHLORIDE 0.9 % IV BOLUS
1000.0000 mL | Freq: Once | INTRAVENOUS | Status: AC
  Administered 2018-06-30: 1000 mL via INTRAVENOUS

## 2018-06-30 MED ORDER — POLYETHYLENE GLYCOL 3350 17 G PO PACK: 17.0000 g | PACK | Freq: Every day | ORAL | Status: DC | PRN

## 2018-06-30 MED ORDER — VITAMIN B-1 100 MG PO TABS
100.0000 mg | ORAL_TABLET | Freq: Every day | ORAL | Status: DC
  Administered 2018-07-01 – 2018-07-02 (×2): 100 mg via ORAL
  Filled 2018-06-30 (×3): qty 1

## 2018-06-30 MED ORDER — LORAZEPAM 2 MG/ML IJ SOLN
1.0000 mg | Freq: Once | INTRAMUSCULAR | Status: AC
  Administered 2018-06-30: 1 mg via INTRAVENOUS

## 2018-06-30 MED ORDER — PHENYTOIN SODIUM EXTENDED 100 MG PO CAPS
100.0000 mg | ORAL_CAPSULE | Freq: Three times a day (TID) | ORAL | Status: DC
  Administered 2018-06-30 – 2018-07-02 (×7): 100 mg via ORAL
  Filled 2018-06-30 (×9): qty 1

## 2018-06-30 MED ORDER — GABAPENTIN 300 MG PO CAPS
300.0000 mg | ORAL_CAPSULE | Freq: Three times a day (TID) | ORAL | Status: DC
  Administered 2018-06-30 – 2018-07-02 (×7): 300 mg via ORAL
  Filled 2018-06-30 (×7): qty 1

## 2018-06-30 MED ORDER — HYDRALAZINE HCL 20 MG/ML IJ SOLN
5.0000 mg | INTRAMUSCULAR | Status: DC | PRN
  Administered 2018-06-30 – 2018-07-01 (×2): 5 mg via INTRAVENOUS
  Filled 2018-06-30 (×3): qty 1

## 2018-06-30 MED ORDER — HYDRALAZINE HCL 20 MG/ML IJ SOLN
INTRAMUSCULAR | Status: AC
  Filled 2018-06-30: qty 1

## 2018-06-30 MED ORDER — ONDANSETRON HCL 4 MG/2ML IJ SOLN
INTRAMUSCULAR | Status: AC
  Filled 2018-06-30: qty 2

## 2018-06-30 MED ORDER — ADULT MULTIVITAMIN W/MINERALS CH
1.0000 | ORAL_TABLET | Freq: Every day | ORAL | Status: DC
  Administered 2018-07-01 – 2018-07-02 (×2): 1 via ORAL
  Filled 2018-06-30 (×3): qty 1

## 2018-06-30 MED ORDER — ORAL CARE MOUTH RINSE
15.0000 mL | Freq: Two times a day (BID) | OROMUCOSAL | Status: DC
  Administered 2018-07-01 (×2): 15 mL via OROMUCOSAL

## 2018-06-30 MED ORDER — SODIUM CHLORIDE 0.9 % IV SOLN
1000.0000 mg | Freq: Once | INTRAVENOUS | Status: AC
  Administered 2018-06-30: 1000 mg via INTRAVENOUS
  Filled 2018-06-30: qty 16

## 2018-06-30 MED ORDER — LISINOPRIL 10 MG PO TABS
10.0000 mg | ORAL_TABLET | Freq: Every day | ORAL | Status: DC
  Administered 2018-06-30: 10 mg via ORAL
  Filled 2018-06-30: qty 1

## 2018-06-30 MED ORDER — LORAZEPAM 2 MG/ML IJ SOLN
INTRAMUSCULAR | Status: AC
  Filled 2018-06-30: qty 1

## 2018-06-30 MED ORDER — FOLIC ACID 1 MG PO TABS
1.0000 mg | ORAL_TABLET | Freq: Every day | ORAL | Status: DC
  Administered 2018-07-01 – 2018-07-02 (×2): 1 mg via ORAL
  Filled 2018-06-30 (×3): qty 1

## 2018-06-30 MED ORDER — POTASSIUM CHLORIDE CRYS ER 20 MEQ PO TBCR
40.0000 meq | EXTENDED_RELEASE_TABLET | Freq: Once | ORAL | Status: AC
  Administered 2018-06-30: 40 meq via ORAL
  Filled 2018-06-30: qty 2

## 2018-06-30 MED ORDER — ONDANSETRON HCL 4 MG PO TABS: 4.0000 mg | ORAL_TABLET | Freq: Four times a day (QID) | ORAL | Status: DC | PRN

## 2018-06-30 MED ORDER — FLUOXETINE HCL 20 MG PO CAPS
20.0000 mg | ORAL_CAPSULE | Freq: Every day | ORAL | Status: DC
  Administered 2018-07-01 – 2018-07-02 (×2): 20 mg via ORAL
  Filled 2018-06-30 (×3): qty 1

## 2018-06-30 MED ORDER — LORAZEPAM 2 MG/ML IJ SOLN
2.0000 mg | INTRAMUSCULAR | Status: DC | PRN
  Administered 2018-07-01: 07:00:00 2 mg via INTRAVENOUS

## 2018-06-30 MED ORDER — ENOXAPARIN SODIUM 40 MG/0.4ML ~~LOC~~ SOLN
40.0000 mg | SUBCUTANEOUS | Status: DC
  Administered 2018-07-02: 10:00:00 40 mg via SUBCUTANEOUS
  Filled 2018-06-30 (×3): qty 0.4

## 2018-06-30 MED ORDER — LORAZEPAM 2 MG/ML IJ SOLN
1.0000 mg | Freq: Once | INTRAMUSCULAR | Status: AC
  Administered 2018-06-30: 1 mg via INTRAVENOUS
  Filled 2018-06-30: qty 1

## 2018-06-30 MED ORDER — LORAZEPAM 1 MG PO TABS
1.0000 mg | ORAL_TABLET | Freq: Four times a day (QID) | ORAL | Status: DC | PRN
  Administered 2018-07-01: 1 mg via ORAL
  Filled 2018-06-30: qty 1

## 2018-06-30 MED ORDER — IBUPROFEN 400 MG PO TABS
400.0000 mg | ORAL_TABLET | Freq: Four times a day (QID) | ORAL | Status: DC | PRN
  Administered 2018-06-30 – 2018-07-01 (×2): 400 mg via ORAL
  Filled 2018-06-30 (×2): qty 1

## 2018-06-30 MED ORDER — ONDANSETRON HCL 4 MG/2ML IJ SOLN
4.0000 mg | Freq: Once | INTRAMUSCULAR | Status: AC
  Administered 2018-06-30: 4 mg via INTRAVENOUS

## 2018-06-30 MED ORDER — NITROGLYCERIN IN D5W 200-5 MCG/ML-% IV SOLN
0.0000 ug/min | INTRAVENOUS | Status: DC
  Administered 2018-06-30: 40 ug/min via INTRAVENOUS
  Filled 2018-06-30: qty 250

## 2018-06-30 MED ORDER — QUETIAPINE FUMARATE 200 MG PO TABS
200.0000 mg | ORAL_TABLET | Freq: Every day | ORAL | Status: DC
  Administered 2018-06-30 – 2018-07-01 (×2): 200 mg via ORAL
  Filled 2018-06-30 (×3): qty 1

## 2018-06-30 MED ORDER — LISINOPRIL 10 MG PO TABS: 10.0000 mg | ORAL_TABLET | Freq: Every day | ORAL | Status: DC

## 2018-06-30 MED ORDER — SODIUM CHLORIDE 0.9 % IV SOLN
INTRAVENOUS | Status: DC
  Administered 2018-06-30 – 2018-07-01 (×2): via INTRAVENOUS

## 2018-06-30 NOTE — H&P (Signed)
[facility] [place] [facility]   PATIENT NAME: Rodney Watson    MR#:  [id number]  DATE OF BIRTH:  1965-11-14  DATE OF ADMISSION:  06/29/2018  PRIMARY CARE PHYSICIAN: Patient, No Pcp Per   REQUESTING/REFERRING PHYSICIAN: Chiquita Loth, MD  CHIEF COMPLAINT:   Chief Complaint  Patient presents with  . Altered Mental Status  . Seizures    HISTORY OF PRESENT ILLNESS:  Rodney Watson  is a 53 y.o. male with a known history of seizure disorder, depression, COPD, alcohol abuse, crack cocaine abuse who was brought to the ED by EMS after being found near the [employer].  He initially appeared somewhat somnolent, and was given Narcan by EMS.  He then became physically aggressive.  On arrival to the ED, he was calm and following commands.  He did have a brief tonic-clonic seizure lasting less than 1 minute.  He was given 1 mg IV Ativan and a dose of IV fosphenytoin.  He was noted to have high blood pressures and was started on a nitroglycerin drip.  Blood pressure subsequently improved.  Patient states that he does not know why he was brought to the ED.  He states that he takes his phenytoin as prescribed.  He has approximately 2 seizures every 6 months.  He denies any vision changes, headache, numbness, weakness, tingling.  No fevers or chills.  PAST MEDICAL HISTORY:   Past Medical History:  Diagnosis Date  . Alcohol abuse   . Alcohol abuse 04/01/2017  . COPD (chronic obstructive pulmonary disease) (HCC)   . Crack cocaine use   . Depression   . ETOH abuse   . Seizures (HCC)     PAST SURGICAL HISTORY:   Past Surgical History:  Procedure Laterality Date  . BACK SURGERY      SOCIAL HISTORY:   Social History   Tobacco Use  . Smoking status: Current Every Day Smoker    Packs/day: 1.50    Years: 39.00    Pack years: 58.50    Types: Cigarettes  . Smokeless tobacco: Current User    Types: Chew  Substance Use Topics  . Alcohol use: Yes    Comment:  5th daily    FAMILY HISTORY:   Family History  Problem Relation Age of Onset  . Alcohol abuse Father   . Seizures Neg Hx     DRUG ALLERGIES:   Allergies  Allergen Reactions  . Chocolate Hives  . Vicodin [Hydrocodone-Acetaminophen] Nausea And Vomiting    REVIEW OF SYSTEMS:   Review of Systems  Constitutional: Negative for chills and fever.  HENT: Negative for congestion and sore throat.   Eyes: Negative for blurred vision and double vision.  Respiratory: Negative for cough and shortness of breath.   Cardiovascular: Negative for chest pain and palpitations.  Gastrointestinal: Negative for nausea and vomiting.  Genitourinary: Negative for dysuria and urgency.  Musculoskeletal: Negative for back pain and neck pain.  Neurological: Positive for seizures. Negative for dizziness, tingling, sensory change, speech change, focal weakness and headaches.  Psychiatric/Behavioral: Negative for depression. The patient is not nervous/anxious.     MEDICATIONS AT HOME:   Prior to Admission medications   Medication Sig Start Date End Date Taking? Authorizing Provider  FLUoxetine (PROZAC) 20 MG capsule Take 1 capsule (20 mg total) by mouth daily. 10/29/17   McNew, Ileene Hutchinson, MD  gabapentin (NEURONTIN) 300 MG capsule Take 1 capsule (300 mg total) by mouth 3 (three) times daily. 10/29/17  McNew, Ileene Hutchinson, MD  levothyroxine (SYNTHROID, LEVOTHROID) 25 MCG tablet Take 1 tablet (25 mcg total) by mouth daily before breakfast. 10/29/17   McNew, Ileene Hutchinson, MD  lisinopril (PRINIVIL,ZESTRIL) 10 MG tablet Take 1 tablet (10 mg total) by mouth daily. For high blood pressure 10/29/17   McNew, Ileene Hutchinson, MD  phenytoin (DILANTIN) 100 MG ER capsule Take 1 capsule (100 mg total) by mouth 3 (three) times daily. 01/23/18 02/22/18  Ihor Austin, MD  QUEtiapine (SEROQUEL) 200 MG tablet Take 1 tablet (200 mg total) by mouth at bedtime. 10/29/17   McNew, Ileene Hutchinson, MD      VITAL SIGNS:  Blood pressure (!) 149/104, pulse 93,  temperature 98.1 F (36.7 C), temperature source Oral, resp. rate 15, height  (1.727 m), weight 54.4 kg, SpO2 98 %.  PHYSICAL EXAMINATION:  Physical Exam  GENERAL:  53 y.o.-year-old patient lying in the bed with no acute distress.  Mildly disheveled appearing. EYES: Pupils equal, round, reactive to light and accommodation. No scleral icterus. Extraocular muscles intact.  HEENT: Head atraumatic, normocephalic. Oropharynx and nasopharynx clear.  NECK:  Supple, no jugular venous distention. No thyroid enlargement, no tenderness.  LUNGS: Normal breath sounds bilaterally, no wheezing, rales,rhonchi or crepitation. No use of accessory muscles of respiration.  CARDIOVASCULAR: RRR, S1, S2 normal. No murmurs, rubs, or gallops.  ABDOMEN: Soft, nontender, nondistended. Bowel sounds present. No organomegaly or mass.  EXTREMITIES: No pedal edema, cyanosis, or clubbing.  NEUROLOGIC: Cranial nerves II through XII are intact. Muscle strength 5/5 in all extremities. Sensation intact. Gait not checked.  PSYCHIATRIC: The patient is alert and oriented x 3.  SKIN: No obvious rash, lesion, or ulcer.   LABORATORY PANEL:   CBC Recent Labs  Lab 06/30/18 0111  WBC 5.4  HGB 13.7  HCT 40.3  PLT 176   ------------------------------------------------------------------------------------------------------------------  Chemistries  Recent Labs  Lab 06/30/18 0111  NA 136  K 3.1*  CL 107  CO2 22  GLUCOSE 118*  BUN 20  CREATININE 0.73  CALCIUM 7.6*  AST 23  ALT 25  ALKPHOS 69  BILITOT 0.3   ------------------------------------------------------------------------------------------------------------------  Cardiac Enzymes Recent Labs  Lab 06/30/18 0111  TROPONINI <0.03   ------------------------------------------------------------------------------------------------------------------  RADIOLOGY:  Ct Head Wo Contrast  Result Date: 06/30/2018 CLINICAL DATA:  Altered mental status and  seizure. EXAM: CT HEAD WITHOUT CONTRAST TECHNIQUE: Contiguous axial images were obtained from the base of the skull through the vertex without intravenous contrast. COMPARISON:  01/21/2018 head CT FINDINGS: Brain: There is no mass, hemorrhage or extra-axial collection. Mild generalized volume loss, unchanged. The brain parenchyma is normal, without acute or chronic infarction. Vascular: No abnormal hyperdensity of the major intracranial arteries or dural venous sinuses. No intracranial atherosclerosis. Skull: The visualized skull base, calvarium and extracranial soft tissues are normal. Sinuses/Orbits: No fluid levels or advanced mucosal thickening of the visualized paranasal sinuses. No mastoid or middle ear effusion. The orbits are normal. IMPRESSION: Unchanged examination without acute abnormality. Mild generalized volume loss. Electronically Signed   By: Deatra Robinson M.D.   On: 06/30/2018 00:48   Dg Chest Port 1 View  Result Date: 06/30/2018 CLINICAL DATA:  Altered mental status. EXAM: PORTABLE CHEST 1 VIEW COMPARISON:  01/21/2018 FINDINGS: The heart size and mediastinal contours are within normal limits. Mild diffuse interstitial coarsening without pulmonary edema or focal consolidation. No pleural effusion or pneumothorax. The visualized skeletal structures are unremarkable. IMPRESSION: No active disease. Electronically Signed   By: Deatra Robinson M.D.   On:  06/30/2018 00:45      IMPRESSION AND PLAN:   Tonic-clonic seizure in known seizure disorder- Dilantin level low -Given IV Ativan and IV fosphenytoin in the ED -Continue home Dilantin -Ativan as needed seizures -Will obtain neurology consult if patient continues to have seizures  Altered mental status-resolved on my exam.  May be due to postictal state.  CT head negative.  UDS positive for cannabis and TCAs. -Continue to monitor  Uncontrolled hypertension- BP initially 209/113, now improved to 149/104 -Started on nitroglycerin drip by  the ED, will stop this now that his blood pressures have improved -Restart home lisinopril -Hydralazine IV as needed  Hypokalemia -Replete and recheck  Elevated acetaminophen level- states he does not recall taking acetaminophen. -Tylenol level initially 98, improved to 29. -Hold on additional doses of Tylenol  Hypothyroidism-stable -Continue home Synthroid  Depression-stable -Continue Prozac and Seroquel  Polysubstance abuse-patient is a known abuser of alcohol, tobacco, crack cocaine.  UDS positive for cannabis and TCAs. -CIWA -Nicotine patch  All the records are reviewed and case discussed with ED provider. Management plans discussed with the patient, family and they are in agreement.  CODE STATUS: Full  TOTAL TIME TAKING CARE OF THIS PATIENT: 45 minutes.    Jinny BlossomKaty D Doraine Schexnider M.D on 06/30/2018 at 7:57 AM  Between 7am to 6pm - Pager - [phone]  After 6pm go to [email or web address] - Social research officer, governmentpassword EPAS [facility]  [facility] [place] [facility]  Office  [phone]  CC: Primary care physician; Patient, No Pcp Per   Note: This dictation was prepared with Dragon dictation along with smaller phrase technology. Any transcriptional errors that result from this process are unintentional.

## 2018-06-30 NOTE — Progress Notes (Signed)
Pt refuses to answer admission profile questions upon admission. Pt wants to rest, requested to do it later.

## 2018-06-30 NOTE — ED Notes (Signed)
Pt states he needs to have a bowel movement but then declined to get up and have BM when staff at bedside.

## 2018-06-30 NOTE — ED Notes (Signed)
MD notified on continued HTN. Order for hydralazine received.

## 2018-06-30 NOTE — Plan of Care (Signed)
Pt admitted today from the ED.  Pt alert to self and place. Follow commands. Denies pain. Slept between care. No seizure activity noted.

## 2018-06-30 NOTE — ED Notes (Signed)
Pt removed PIV from LFA. New PIV placed to LFA, hydralazine given for SBP >170 per order. Pt remains confused, able to answer some questions but frequently requires redirection.

## 2018-06-30 NOTE — ED Notes (Signed)
Received call from Lanny Hurst (peer support/counselor) with [employer]. States her ministry works with homeless population and provides community resources and she will call for updates on pt condition as he has no family or close relations that she is aware of. Given update with pt's permission.

## 2018-06-30 NOTE — ED Notes (Signed)
md notified of continued htn.

## 2018-06-30 NOTE — ED Notes (Signed)
Pt vomiting. Pt continues to ask for water after vomiting. md notified, order for zofran received.

## 2018-06-30 NOTE — ED Notes (Signed)
Report to alicia, rn.  

## 2018-06-30 NOTE — TOC Initial Note (Signed)
Transition of Care [facility](TOC) - Initial/Assessment Note    Patient Details  Name: Rodney Watson MRN: [id number] Date of Birth: September 20, 1965  Transition of Care [facility](TOC) CM/SW Contact:    Chapman FitchBOWEN, Prudie Guthridge T, RN Phone Number: 06/30/2018, 4:30 PM  Clinical Narrative:                 Patient admitted for tonic clonic seizures.   Patient states that [facility] at [facility].  Patient states that he has lived there for 9 months.  Patient states that he has a peer support person named Apolinar JunesBrandon.  Apolinar JunesBrandon helps provides transportation when needed.  Patient states that Apolinar JunesBrandon or a church member will be picking him up at time of discharge. Patient denies any issues with transportation.   Patient states he obtains his medications from [facility].  Patient denies any issues obtaining medications.  RNCM inquired if his peer support or [facility] offers any financial support for medications.  Patient states "I don't know, Lavenia Atlasve never been in a place where I needed to ask"   Patient states he is current with his PCP at [facility] in [place].  Patient can not recall the name of his PCP.   Patient states that he is currently on probation for stealing a bottle of L[employer]ine from [employer]  Patient states he has been sober from alcohol for 231 days.  The only drug patient acknowledge he uses is mariajuana.    Expected Discharge Plan: Home/Self Care Barriers to Discharge: Continued Medical Work up   Patient Goals and CMS Choice        Expected Discharge Plan and Services Expected Discharge Plan: Home/Self Care   Discharge Planning Services: CM Consult   Living arrangements for the past 2 months: Homeless Shelter Expected Discharge Date: 07/01/18                                    Prior Living Arrangements/Services Living arrangements for the past 2 months: Homeless Shelter   Patient language and need for interpreter reviewed:: No Do you feel safe going back to the place  where you live?: Yes      Need for Family Participation in Patient Care: No (Comment)     Criminal Activity/Legal Involvement Pertinent to Current Situation/Hospitalization: Yes - Comment as needed(probation for theft)  Activities of Daily Living Home Assistive Devices/Equipment: None ADL Screening (condition at time of admission) Patient's cognitive ability adequate to safely complete daily activities?: Yes Is the patient deaf or have difficulty hearing?: No Does the patient have difficulty seeing, even when wearing glasses/contacts?: Yes Does the patient have difficulty concentrating, remembering, or making decisions?: Yes Patient able to express need for assistance with ADLs?: Yes Does the patient have difficulty dressing or bathing?: Yes Independently performs ADLs?: No Communication: Independent Dressing (OT): Independent Grooming: Independent Feeding: Independent Bathing: Needs assistance Toileting: Independent(Uses urinal. May need assist to ambulate to bathroom) In/Out Bed: Needs assistance Walks in Home: Independent Does the patient have difficulty walking or climbing stairs?: Yes Weakness of Legs: None Weakness of Arms/Hands: None  Permission Sought/Granted                  Emotional Assessment Appearance:: Appears stated age   Affect (typically observed): Accepting Orientation: : Oriented to Self, Oriented to Place, Oriented to  Time, Oriented to Situation Alcohol / Substance Use: Illicit Drugs    Admission diagnosis:  Hypertensive  encephalopathy [I67.4] Hypokalemia [E87.6] Marijuana abuse [F12.10] Seizure disorder (HCC) [G40.909] Hypertensive urgency [I16.0] Patient Active Problem List   Diagnosis Date Noted  . Seizures (HCC) 06/30/2018  . Severe recurrent major depression without psychotic features (HCC) 10/25/2017  . Suicide attempt (HCC) 10/24/2017  . Major depressive disorder, recurrent episode, severe (HCC) 09/29/2017  . Alcohol withdrawal (HCC)  07/18/2017  . Seizure disorder (HCC) 07/18/2017  . Essential hypertension 07/18/2017  . Alcohol abuse with alcohol-induced mood disorder (HCC) 04/10/2017   PCP:  Patient, No Pcp Per Pharmacy:   [facility] [address]), [place] - [address] [place]) [place] [address] Phone: [phone] Fax: [phone]  [facility] - [place], [place] - [address] [place] [address] Phone: [phone] Fax: [phone]     Social Determinants of Health (SDOH) Interventions    Readmission Risk Interventions Readmission Risk Prevention Plan 06/30/2018  Transportation Screening Complete  Palliative Care Screening Not Applicable  Medication Review (RN Care Manager) Complete

## 2018-06-30 NOTE — ED Notes (Signed)
Pt with seizure like activity. md notified.

## 2018-06-30 NOTE — ED Notes (Signed)
Patient transported to room 129

## 2018-06-30 NOTE — ED Notes (Addendum)
ED TO INPATIENT HANDOFF REPORT  ED Nurse Name and Phone #:  Helmut Musterlicia [phone]  S Name/Age/Gender Rodney Watson 53 y.o. male Room/Bed: ED24A/ED24A  Code Status   Code Status: Prior  Home/SNF/Other Home Patient oriented to: self and place Is this baseline? No   Triage Complete: Triage complete  Chief Complaint [facility] - Altered mental status  Triage Note Pt presents via EMS. Pt found near [employer] building, presumed homeless. Pt has hx of seizures and suspected of having a seizure. Pt is poorly responsive to verbal and is difficult to rouse. Pt was administered narcan for pinpoint pupils and somnolence. Pt is intermittently physically aggressive toward staff and attempting to pull out lines. Pt's pupils remain pinpoint and sluggish. Pt is able to follow commands, but does not always choose to do so. Pt made several attempts to get out of bed but fell back into sleep upon verbal and physical redirection.    Allergies Allergies  Allergen Reactions  . Chocolate Hives  . Vicodin [Hydrocodone-Acetaminophen] Nausea And Vomiting    Level of Care/Admitting Diagnosis ED Disposition    ED Disposition Condition Comment   Admit  Hospital Area: [facility] [[id number]]  Level of Care: Med-Surg [16]  Covid Evaluation: N/A  Diagnosis: Seizures (HCC) [[id number]][[id number]]  Admitting Physician: Willadean CarolMAYO, KATY DODD [[id number]]  Attending Physician: Willadean CarolMAYO, KATY DODD [[id number]]  PT Class (Do Not Modify): Observation [104]  PT Acc Code (Do Not Modify): Observation [10022]       B Medical/Surgery History Past Medical History:  Diagnosis Date  . Alcohol abuse   . Alcohol abuse 04/01/2017  . COPD (chronic obstructive pulmonary disease) (HCC)   . Crack cocaine use   . Depression   . ETOH abuse   . Seizures (HCC)    Past Surgical History:  Procedure Laterality Date  . BACK SURGERY       A IV Location/Drains/Wounds Patient Lines/Drains/Airways Status   Active Line/Drains/Airways     Name:   Placement date:   Placement time:   Site:   Days:   Peripheral IV 06/30/18 Left Forearm   06/30/18    0828    Forearm   less than 1          Intake/Output Last 24 hours  Intake/Output Summary (Last 24 hours) at 06/30/2018 0846 Last data filed at 06/30/2018 [id number] Gross per 24 hour  Intake 1086.09 ml  Output 1600 ml  Net -513.91 ml    Labs/Imaging Results for orders placed or performed during the hospital encounter of 06/29/18 (from the past 48 hour(s))  CBC with Differential     Status: None   Collection Time: 06/30/18  1:11 AM  Result Value Ref Range   WBC 5.4 4.0 - 10.5 K/uL   RBC 4.55 4.22 - 5.81 MIL/uL   Hemoglobin 13.7 13.0 - 17.0 g/dL   HCT 86.540.3 78.439.0 - 69.652.0 %   MCV 88.6 80.0 - 100.0 fL   MCH 30.1 26.0 - 34.0 pg   MCHC 34.0 30.0 - 36.0 g/dL   RDW 29.514.1 28.411.5 - 13.215.5 %   Platelets 176 150 - 400 K/uL   nRBC 0.0 0.0 - 0.2 %   Neutrophils Relative % 65 %   Neutro Abs 3.5 1.7 - 7.7 K/uL   Lymphocytes Relative 25 %   Lymphs Abs 1.3 0.7 - 4.0 K/uL   Monocytes Relative 7 %   Monocytes Absolute 0.4 0.1 - 1.0 K/uL   Eosinophils Relative 1 %   Eosinophils Absolute  0.1 0.0 - 0.5 K/uL   Basophils Relative 1 %   Basophils Absolute 0.0 0.0 - 0.1 K/uL   WBC Morphology MORPHOLOGY UNREMARKABLE    RBC Morphology MORPHOLOGY UNREMARKABLE    Smear Review Normal platelet morphology    Immature Granulocytes 1 %   Abs Immature Granulocytes 0.03 0.00 - 0.07 K/uL    Comment: Performed at [facility], [address] [place], [place] [address]  Comprehensive metabolic panel     Status: Abnormal   Collection Time: 06/30/18  1:11 AM  Result Value Ref Range   Sodium 136 135 - 145 mmol/L   Potassium 3.1 (L) 3.5 - 5.1 mmol/L   Chloride 107 98 - 111 mmol/L   CO2 22 22 - 32 mmol/L   Glucose, Bld 118 (H) 70 - 99 mg/dL   BUN 20 6 - 20 mg/dL   Creatinine, Ser 6.57 0.61 - 1.24 mg/dL   Calcium 7.6 (L) 8.9 - 10.3 mg/dL   Total Protein 6.4 (L) 6.5 - 8.1 g/dL   Albumin 4.2 3.5 -  5.0 g/dL   AST 23 15 - 41 U/L   ALT 25 0 - 44 U/L   Alkaline Phosphatase 69 38 - 126 U/L   Total Bilirubin 0.3 0.3 - 1.2 mg/dL   GFR calc non Af Amer >60 >60 mL/min   GFR calc Af Amer >60 >60 mL/min   Anion gap 7 5 - 15    Comment: Performed at [facility], [address] [place], [place] [address]  Ethanol     Status: None   Collection Time: 06/30/18  1:11 AM  Result Value Ref Range   Alcohol, Ethyl (B) <10 <10 mg/dL    Comment: (NOTE) Lowest detectable limit for serum alcohol is 10 mg/dL. For medical purposes only. Performed at [facility], [address] [place], [place] [address]   Acetaminophen level     Status: Abnormal   Collection Time: 06/30/18  1:11 AM  Result Value Ref Range   Acetaminophen (Tylenol), Serum 98 (H) 10 - 30 ug/mL    Comment: (NOTE) Therapeutic concentrations vary significantly. A range of 10-30 ug/mL  may be an effective concentration for many patients. However, some  are best treated at concentrations outside of this range. Acetaminophen concentrations >150 ug/mL at 4 hours after ingestion  and >50 ug/mL at 12 hours after ingestion are often associated with  toxic reactions. Performed at [facility], [address] [place], [place] [address]   Salicylate level     Status: None   Collection Time: 06/30/18  1:11 AM  Result Value Ref Range   Salicylate Lvl <7.0 2.8 - 30.0 mg/dL    Comment: Performed at [facility], [address] [place], [place] [address]  Troponin I - Once     Status: None   Collection Time: 06/30/18  1:11 AM  Result Value Ref Range   Troponin I <0.03 <0.03 ng/mL    Comment: Performed at [facility], [address] [place], [place] [address]  Urinalysis, Complete w Microscopic     Status: Abnormal   Collection Time: 06/30/18  1:11 AM  Result Value Ref Range   Color, Urine YELLOW (A) YELLOW   APPearance CLEAR (A) CLEAR   Specific Gravity, Urine 1.021 1.005 - 1.030    pH 5.0 5.0 - 8.0   Glucose, UA NEGATIVE NEGATIVE mg/dL   Hgb urine dipstick NEGATIVE NEGATIVE   Bilirubin Urine NEGATIVE NEGATIVE   Ketones, ur  NEGATIVE NEGATIVE mg/dL   Protein, ur NEGATIVE NEGATIVE mg/dL   Nitrite NEGATIVE NEGATIVE   Leukocytes,Ua NEGATIVE NEGATIVE   WBC, UA 0-5 0 - 5 WBC/hpf   Bacteria, UA NONE SEEN NONE SEEN   Squamous Epithelial / LPF NONE SEEN 0 - 5    Comment: Performed at [facility], [address], [place], [place] [address]  Phenytoin level, total     Status: Abnormal   Collection Time: 06/30/18  1:11 AM  Result Value Ref Range   Phenytoin Lvl 8.0 (L) 10.0 - 20.0 ug/mL    Comment: Performed at [facility], [address], [place], [place] [address]  SARS Coronavirus 2 (CEPHEID - Performed in [facility] hospital lab), Hosp Order     Status: None   Collection Time: 06/30/18  1:11 AM  Result Value Ref Range   SARS Coronavirus 2 NEGATIVE NEGATIVE    Comment: (NOTE) If result is NEGATIVE SARS-CoV-2 target nucleic acids are NOT DETECTED. The SARS-CoV-2 RNA is generally detectable in upper and lower  respiratory specimens during the acute phase of infection. The lowest  concentration of SARS-CoV-2 viral copies this assay can detect is 250  copies / mL. A negative result does not preclude SARS-CoV-2 infection  and should not be used as the sole basis for treatment or other  patient management decisions.  A negative result may occur with  improper specimen collection / handling, submission of specimen other  than nasopharyngeal swab, presence of viral mutation(s) within the  areas targeted by this assay, and inadequate number of viral copies  (<250 copies / mL). A negative result must be combined with clinical  observations, patient history, and epidemiological information. If result is POSITIVE SARS-CoV-2 target nucleic acids are DETECTED. The SARS-CoV-2 RNA is generally detectable in upper and lower  respiratory specimens dur ing the  acute phase of infection.  Positive  results are indicative of active infection with SARS-CoV-2.  Clinical  correlation with patient history and other diagnostic information is  necessary to determine patient infection status.  Positive results do  not rule out bacterial infection or co-infection with other viruses. If result is PRESUMPTIVE POSTIVE SARS-CoV-2 nucleic acids MAY BE PRESENT.   A presumptive positive result was obtained on the submitted specimen  and confirmed on repeat testing.  While 2019 novel coronavirus  (SARS-CoV-2) nucleic acids may be present in the submitted sample  additional confirmatory testing may be necessary for epidemiological  and / or clinical management purposes  to differentiate between  SARS-CoV-2 and other Sarbecovirus currently known to infect humans.  If clinically indicated additional testing with an alternate test  methodology [phone]) is advised. The SARS-CoV-2 RNA is generally  detectable in upper and lower respiratory sp ecimens during the acute  phase of infection. The expected result is Negative. Fact Sheet for Patients:  BoilerBrush.com.cy Fact Sheet for Healthcare Providers: https://pope.com/ This test is not yet approved or cleared by the [place] FDA and has been authorized for detection and/or diagnosis of SARS-CoV-2 by FDA under an Emergency Use Authorization (EUA).  This EUA will remain in effect (meaning this test can be used) for the duration of the COVID-19 declaration under Section 564(b)(1) of the Act, 21 U.S.C. section 360bbb-3(b)(1), unless the authorization is terminated or revoked sooner. Performed at [facility], [address] [place], [place] [address]   Urine Drug Screen, [facility] only)     Status: Abnormal   Collection Time: 06/30/18  1:11 AM  Result  Value Ref Range   Tricyclic, Ur Screen POSITIVE (A) NONE DETECTED   Amphetamines, Ur Screen  NONE DETECTED NONE DETECTED   MDMA (Ecstasy)Ur Screen NONE DETECTED NONE DETECTED   Cocaine Metabolite,Ur [place] NONE DETECTED NONE DETECTED   Opiate, Ur Screen NONE DETECTED NONE DETECTED   Phencyclidine (PCP) Ur S NONE DETECTED NONE DETECTED   Cannabinoid 50 Ng, Ur [place] POSITIVE (A) NONE DETECTED   Barbiturates, Ur Screen NONE DETECTED NONE DETECTED   Benzodiazepine, Ur Scrn NONE DETECTED NONE DETECTED   Methadone Scn, Ur NONE DETECTED NONE DETECTED    Comment: (NOTE) Tricyclics + metabolites, urine    Cutoff 1000 ng/mL Amphetamines + metabolites, urine  Cutoff 1000 ng/mL MDMA (Ecstasy), urine              Cutoff 500 ng/mL Cocaine Metabolite, urine          Cutoff 300 ng/mL Opiate + metabolites, urine        Cutoff 300 ng/mL Phencyclidine (PCP), urine         Cutoff 25 ng/mL Cannabinoid, urine                 Cutoff 50 ng/mL Barbiturates + metabolites, urine  Cutoff 200 ng/mL Benzodiazepine, urine              Cutoff 200 ng/mL Methadone, urine                   Cutoff 300 ng/mL The urine drug screen provides only a preliminary, unconfirmed analytical test result and should not be used for non-medical purposes. Clinical consideration and professional judgment should be applied to any positive drug screen result due to possible interfering substances. A more specific alternate chemical method must be used in order to obtain a confirmed analytical result. Gas chromatography / mass spectrometry (GC/MS) is the preferred confirmat ory method. Performed at [facility], [address] [place], [place] [address]   Acetaminophen level     Status: None   Collection Time: 06/30/18  5:23 AM  Result Value Ref Range   Acetaminophen (Tylenol), Serum 29 10 - 30 ug/mL    Comment: (NOTE) Therapeutic concentrations vary significantly. A range of 10-30 ug/mL  may be an effective concentration for many patients. However, some  are best treated at concentrations outside of this  range. Acetaminophen concentrations >150 ug/mL at 4 hours after ingestion  and >50 ug/mL at 12 hours after ingestion are often associated with  toxic reactions. Performed at [facility], [address] [place], [place] [address]   Salicylate level     Status: None   Collection Time: 06/30/18  5:23 AM  Result Value Ref Range   Salicylate Lvl <7.0 2.8 - 30.0 mg/dL    Comment: Performed at [facility], [address] [place], [place] [address]  Glucose, capillary     Status: Abnormal   Collection Time: 06/30/18  6:23 AM  Result Value Ref Range   Glucose-Capillary 124 (H) 70 - 99 mg/dL   Ct Head Wo Contrast  Result Date: 06/30/2018 CLINICAL DATA:  Altered mental status and seizure. EXAM: CT HEAD WITHOUT CONTRAST TECHNIQUE: Contiguous axial images were obtained from the base of the skull through the vertex without intravenous contrast. COMPARISON:  01/21/2018 head CT FINDINGS: Brain: There is no mass, hemorrhage or extra-axial collection. Mild generalized volume loss, unchanged. The brain parenchyma is normal, without acute or chronic infarction. Vascular: No abnormal hyperdensity of the major intracranial arteries or dural venous  sinuses. No intracranial atherosclerosis. Skull: The visualized skull base, calvarium and extracranial soft tissues are normal. Sinuses/Orbits: No fluid levels or advanced mucosal thickening of the visualized paranasal sinuses. No mastoid or middle ear effusion. The orbits are normal. IMPRESSION: Unchanged examination without acute abnormality. Mild generalized volume loss. Electronically Signed   By: Deatra Robinson M.D.   On: 06/30/2018 00:48   Dg Chest Port 1 View  Result Date: 06/30/2018 CLINICAL DATA:  Altered mental status. EXAM: PORTABLE CHEST 1 VIEW COMPARISON:  01/21/2018 FINDINGS: The heart size and mediastinal contours are within normal limits. Mild diffuse interstitial coarsening without pulmonary edema or focal consolidation. No pleural  effusion or pneumothorax. The visualized skeletal structures are unremarkable. IMPRESSION: No active disease. Electronically Signed   By: Deatra Robinson M.D.   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,   R     Signed and Held   Signed and Held  CBC  Tomorrow morning,   R     Signed and Held          Vitals/Pain Today's Vitals   06/30/18 0715 06/30/18 0730 06/30/18 0815 06/30/18 0831  BP: (!) 149/104 (!) 162/99 (!) 180/95 (!) 157/89  Pulse: 93   79  Resp: Temp:      TempSrc:      SpO2: 98%   97%  Weight:      Height:        Isolation Precautions No active isolations  Medications Medications  lisinopril (ZESTRIL) tablet 10 mg (has no administration in time range)  hydrALAZINE (APRESOLINE) injection 5 mg (5 mg Intravenous Given 06/30/18 0828)  sodium chloride 0.9 % bolus 1,000 mL (0 mLs Intravenous Stopped 06/30/18 0230)  fosPHENYtoin (CEREBYX) 1,000 mg PE in sodium chloride 0.9 % 50 mL IVPB (0 mg PE Intravenous Stopped 06/30/18 0311)  potassium chloride SA (K-DUR) CR tablet 40 mEq (40 mEq Oral Given 06/30/18 0301)  hydrALAZINE (APRESOLINE) injection 10 mg (10 mg Intravenous Given 06/30/18 0300)  ondansetron (ZOFRAN) injection 4 mg (4 mg Intravenous Given 06/30/18 0316)  LORazepam (ATIVAN) injection 1 mg (1 mg Intravenous Given 06/30/18 0430)  hydrALAZINE (APRESOLINE) injection 10 mg (10 mg Intravenous Given 06/30/18 0512)  LORazepam (ATIVAN) injection 1 mg (1 mg Intravenous Given 06/30/18 1610)    Mobility walks with person assist High fall risk   Focused Assessments Cardiac Assessment Handoff:    Lab Results  Component Value Date   TROPONINI <0.03 06/30/2018   No results found for: DDIMER Does the Patient currently have chest pain? No  , Pulmonary Assessment Handoff:  Lung sounds:  clear bil O2 Device: Room Air     R Recommendations:  See Admitting Provider Note  Report given to: Malka, RN  Additional Notes:

## 2018-06-30 NOTE — ED Notes (Addendum)
Pt has removed monitoring equipment, pt up out of bed drinking water from sink and vomiting. Pt informed that he is to remain NPO at this time. Pt states "i'm not gonna do what you're askin' me to do, I don't want to, i'm just bein' honest". Pt again informed of recommendations and orders.

## 2018-06-30 NOTE — ED Notes (Signed)
Lab called for venipuncture assist.  

## 2018-06-30 NOTE — ED Provider Notes (Signed)
[facility] Emergency Department Provider Note   ____________________________________________   First MD Initiated Contact with Patient 06/29/18 2359     (approximate)  I have reviewed the triage vital signs and the nursing notes.   HISTORY  Chief Complaint Altered Mental Status and Seizures  Level V caveat: Limited by altered mentation  HPI Rodney Watson is a 53 y.o. male brought to the ED via EMS from the streets with altered mentation.  Patient has a history of alcohol abuse, seizure disorder, homelessness who was found [place] the [employer] outdoors.  He was initially difficult to arouse and administered Narcan for pinpoint pupils and somnolence.  Patient became intermittently physically aggressive after Narcan.  Arrives to the ED calm, following commands, somewhat cooperative with questioning and in no acute distress.  States he is compliant with his seizure medications.  Denies recent fever, cough, chest pain, shortness breath, abdominal pain, nausea or vomiting.        Past Medical History:  Diagnosis Date  . Alcohol abuse   . Alcohol abuse 04/01/2017  . COPD (chronic obstructive pulmonary disease) (HCC)   . Crack cocaine use   . Depression   . ETOH abuse   . Seizures [facility])     Patient Active Problem List   Diagnosis Date Noted  . Severe recurrent major depression without psychotic features (HCC) 10/25/2017  . Suicide attempt (HCC) 10/24/2017  . Major depressive disorder, recurrent episode, severe (HCC) 09/29/2017  . Alcohol withdrawal (HCC) 07/18/2017  . Seizure disorder (HCC) 07/18/2017  . Essential hypertension 07/18/2017  . Alcohol abuse with alcohol-induced mood disorder (HCC) 04/10/2017    Past Surgical History:  Procedure Laterality Date  . BACK SURGERY      Prior to Admission medications   Medication Sig Start Date End Date Taking? Authorizing Provider  FLUoxetine (PROZAC) 20 MG capsule Take 1 capsule (20 mg  total) by mouth daily. 10/29/17   McNew, Ileene Hutchinson, MD  gabapentin (NEURONTIN) 300 MG capsule Take 1 capsule (300 mg total) by mouth 3 (three) times daily. 10/29/17   McNew, Ileene Hutchinson, MD  levothyroxine (SYNTHROID, LEVOTHROID) 25 MCG tablet Take 1 tablet (25 mcg total) by mouth daily before breakfast. 10/29/17   McNew, Ileene Hutchinson, MD  lisinopril (PRINIVIL,ZESTRIL) 10 MG tablet Take 1 tablet (10 mg total) by mouth daily. For high blood pressure 10/29/17   McNew, Ileene Hutchinson, MD  phenytoin (DILANTIN) 100 MG ER capsule Take 1 capsule (100 mg total) by mouth 3 (three) times daily. 01/23/18 02/22/18  Ihor Austin, MD  QUEtiapine (SEROQUEL) 200 MG tablet Take 1 tablet (200 mg total) by mouth at bedtime. 10/29/17   McNew, Ileene Hutchinson, MD    Allergies Chocolate and Vicodin [hydrocodone-acetaminophen]  Family History  Problem Relation Age of Onset  . Alcohol abuse Father   . Seizures Neg Hx     Social History Social History   Tobacco Use  . Smoking status: Current Every Day Smoker    Packs/day: 1.50    Years: 39.00    Pack years: 58.50    Types: Cigarettes  . Smokeless tobacco: Current User    Types: Chew  Substance Use Topics  . Alcohol use: Yes    Comment: 5th daily  . Drug use: Yes    Types: Marijuana, Cocaine    Comment: 1-2 joints/week    Review of Systems  Constitutional: No fever/chills Eyes: No visual changes. ENT: No sore throat. Cardiovascular: Denies chest pain. Respiratory: Denies shortness of breath.  Gastrointestinal: No abdominal pain.  No nausea, no vomiting.  No diarrhea.  No constipation. Genitourinary: Negative for dysuria. Musculoskeletal: Negative for back pain. Skin: Negative for rash. Neurological: Positive for altered mentation.  Negative for headaches, focal weakness or numbness. Psychiatric:  Positive for polysubstance use.  ____________________________________________   PHYSICAL EXAM:  VITAL SIGNS: ED Triage Vitals  Enc Vitals Group     BP 06/30/18 0003 (!)  184/109     Pulse Rate 06/30/18 0003 84     Resp 06/30/18 0003 13     Temp 06/30/18 0003 98.1 F (36.7 C)     Temp Source 06/30/18 0003 Oral     SpO2 06/30/18 0003 97 %     Weight 06/30/18 0004 119 lb 14.9 oz (54.4 kg)     Height 06/30/18 0004  (1.727 m)     Head Circumference --      Peak Flow --      Pain Score --      Pain Loc --      Pain Edu? --      Excl. in GC? --     Constitutional: Alert and oriented.  Disheveled appearing and in no acute distress. Eyes: Conjunctivae are normal. PERRL. EOMI. Head: Atraumatic. Nose: Atraumatic. Mouth/Throat: Mucous membranes are moist.  Did not bite tongue. Neck: No stridor.  No cervical spine tenderness to palpation. Cardiovascular: Normal rate, regular rhythm. Grossly normal heart sounds.  Good peripheral circulation. Respiratory: Normal respiratory effort.  No retractions. Lungs CTAB. Gastrointestinal: Soft and nontender. No distention. No abdominal bruits. No CVA tenderness. Genitourinary: Wearing paper prescription pants. Musculoskeletal: No lower extremity tenderness nor edema.  No joint effusions. Neurologic:  Normal speech and language. No gross focal neurologic deficits are appreciated. MAEx4.  Answers questions which he wishes to answer; otherwise avoids eye contact and stares into space. Skin:  Skin is warm, dry and intact. No rash noted. Psychiatric: Mood and affect are normal. Speech and behavior are normal.  ____________________________________________   LABS (all labs ordered are listed, but only abnormal results are displayed)  Labs Reviewed  COMPREHENSIVE METABOLIC PANEL - Abnormal; Notable for the following components:      Result Value   Potassium 3.1 (*)    Glucose, Bld 118 (*)    Calcium 7.6 (*)    Total Protein 6.4 (*)    All other components within normal limits  ACETAMINOPHEN LEVEL - Abnormal; Notable for the following components:   Acetaminophen (Tylenol), Serum 98 (*)    All other components within  normal limits  URINALYSIS, COMPLETE (UACMP) WITH MICROSCOPIC - Abnormal; Notable for the following components:   Color, Urine YELLOW (*)    APPearance CLEAR (*)    All other components within normal limits  PHENYTOIN LEVEL, TOTAL - Abnormal; Notable for the following components:   Phenytoin Lvl 8.0 (*)    All other components within normal limits  URINE DRUG SCREEN, QUALITATIVE ([facility] ONLY) - Abnormal; Notable for the following components:   Tricyclic, Ur Screen POSITIVE (*)    Cannabinoid 50 Ng, Ur [place] POSITIVE (*)    All other components within normal limits  GLUCOSE, CAPILLARY - Abnormal; Notable for the following components:   Glucose-Capillary 124 (*)    All other components within normal limits  SARS CORONAVIRUS 2 (HOSPITAL ORDER, PERFORMED IN [facility])  CBC WITH DIFFERENTIAL/PLATELET  ETHANOL  SALICYLATE LEVEL  TROPONIN I  ACETAMINOPHEN LEVEL  SALICYLATE LEVEL   ____________________________________________  EKG  ED ECG REPORT  I, Irean HongSUNG,Mihir Flanigan J, the attending physician, personally viewed and interpreted this ECG.   Date: 06/30/2018  EKG Time: 0002  Rate: 82  Rhythm: normal EKG, normal sinus rhythm  Axis: Normal  Intervals:QTC 507  ST&T Change: Nonspecific  ____________________________________________  RADIOLOGY  ED MD interpretation: No ICH, no active cardiopulmonary process  Official radiology report(s): Ct Head Wo Contrast  Result Date: 06/30/2018 CLINICAL DATA:  Altered mental status and seizure. EXAM: CT HEAD WITHOUT CONTRAST TECHNIQUE: Contiguous axial images were obtained from the base of the skull through the vertex without intravenous contrast. COMPARISON:  01/21/2018 head CT FINDINGS: Brain: There is no mass, hemorrhage or extra-axial collection. Mild generalized volume loss, unchanged. The brain parenchyma is normal, without acute or chronic infarction. Vascular: No abnormal hyperdensity of the major intracranial arteries or dural venous  sinuses. No intracranial atherosclerosis. Skull: The visualized skull base, calvarium and extracranial soft tissues are normal. Sinuses/Orbits: No fluid levels or advanced mucosal thickening of the visualized paranasal sinuses. No mastoid or middle ear effusion. The orbits are normal. IMPRESSION: Unchanged examination without acute abnormality. Mild generalized volume loss. Electronically Signed   By: Deatra RobinsonKevin  Herman M.D.   On: 06/30/2018 00:48   Dg Chest Port 1 View  Result Date: 06/30/2018 CLINICAL DATA:  Altered mental status. EXAM: PORTABLE CHEST 1 VIEW COMPARISON:  01/21/2018 FINDINGS: The heart size and mediastinal contours are within normal limits. Mild diffuse interstitial coarsening without pulmonary edema or focal consolidation. No pleural effusion or pneumothorax. The visualized skeletal structures are unremarkable. IMPRESSION: No active disease. Electronically Signed   By: Deatra RobinsonKevin  Herman M.D.   On: 06/30/2018 00:45    ____________________________________________   PROCEDURES  Procedure(s) performed (including Critical Care):  Procedures  CRITICAL CARE Performed by: Irean HongSUNG,Albirtha Grinage J   Total critical care time: 45 minutes  Critical care time was exclusive of separately billable procedures and treating other patients.  Critical care was necessary to treat or prevent imminent or life-threatening deterioration.  Critical care was time spent personally by me on the following activities: development of treatment plan with patient and/or surrogate as well as nursing, discussions with consultants, evaluation of patient's response to treatment, examination of patient, obtaining history from patient or surrogate, ordering and performing treatments and interventions, ordering and review of laboratory studies, ordering and review of radiographic studies, pulse oximetry and re-evaluation of patient's condition. ____________________________________________   INITIAL IMPRESSION / ASSESSMENT AND  PLAN / ED COURSE  As part of my medical decision making, I reviewed the following data within the electronic MEDICAL RECORD NUMBER Nursing notes reviewed and incorporated, Labs reviewed, EKG interpreted, Old chart reviewed, Radiograph reviewed and Notes from prior ED visits     Timoteo AceWilliam Trani was evaluated in Emergency Department on 06/30/2018 for the symptoms described in the history of present illness. He was evaluated in the context of the global COVID-19 pandemic, which necessitated consideration that the patient might be at risk for infection with the SARS-CoV-2 virus that causes COVID-19. Institutional protocols and algorithms that pertain to the evaluation of patients at risk for COVID-19 are in a state of rapid change based on information released by regulatory bodies including the CDC and federal and state organizations. These policies and algorithms were followed during the patient's care in the ED.   53 year old male with seizure disorder, alcohol abuse, homelessness brought to the ED from the streets with altered mentation.  Patient had good response to Narcan.  Differential diagnosis includes but is not limited to postictal state, seizure, alcohol withdrawal, ICH,  CVA, metabolic, infectious etiologies, etc.  Will obtain toxicological lab work and urine, obtain CT scan.  Check Dilantin level and reassess.   Clinical Course as of Jun 30 646  Mon Jun 30, 2018  0203 Noted elevated acetaminophen level.  Unable to obtain accurate history from patient as to if and when he ingested acetaminophen.  Will check 4-hour level.  Load fosphenytoin for subtherapeutic level.   [JS]  [phone] Patient remains very hypertensive despite Ativan and another dose of hydralazine.  Current blood pressure is 156/128.  Will start nitroglycerin drip at this time and discuss with hospitalist to evaluate patient in the emergency department for admission.   [JS]  0620 Patient had brief, less than 1 minute tonic/clonic seizure.   Had been walking to the sink to guzzle water.  He was made n.p.o. because he drank a lot of water earlier and vomited.  At this time will administer 1 mg IV Ativan.  Patient had IV fosphenytoin for subtherapeutic Dilantin level.   [JS]  6213 Spoke with hospitalist NP Marylene Land who will evaluate patient in the emergency department for admission.   [JS]    Clinical Course User Index [JS] Irean Hong, MD     ____________________________________________   FINAL CLINICAL IMPRESSION(S) / ED DIAGNOSES  Final diagnoses:  Hypertensive urgency  Hypertensive encephalopathy  Hypokalemia  Marijuana abuse  Seizure disorder [facility])     ED Discharge Orders    None       Note:  This document was prepared using Dragon voice recognition software and may include unintentional dictation errors.   Irean Hong, MD 06/30/18 [phone]

## 2018-07-01 DIAGNOSIS — Z1159 Encounter for screening for other viral diseases: Secondary | ICD-10-CM | POA: Diagnosis not present

## 2018-07-01 DIAGNOSIS — R110 Nausea: Secondary | ICD-10-CM | POA: Diagnosis not present

## 2018-07-01 DIAGNOSIS — E876 Hypokalemia: Secondary | ICD-10-CM | POA: Diagnosis present

## 2018-07-01 DIAGNOSIS — Z885 Allergy status to narcotic agent status: Secondary | ICD-10-CM | POA: Diagnosis not present

## 2018-07-01 DIAGNOSIS — I10 Essential (primary) hypertension: Secondary | ICD-10-CM | POA: Diagnosis present

## 2018-07-01 DIAGNOSIS — G40909 Epilepsy, unspecified, not intractable, without status epilepticus: Secondary | ICD-10-CM | POA: Diagnosis not present

## 2018-07-01 DIAGNOSIS — E039 Hypothyroidism, unspecified: Secondary | ICD-10-CM | POA: Diagnosis present

## 2018-07-01 DIAGNOSIS — G4089 Other seizures: Secondary | ICD-10-CM | POA: Diagnosis present

## 2018-07-01 DIAGNOSIS — Z91018 Allergy to other foods: Secondary | ICD-10-CM | POA: Diagnosis not present

## 2018-07-01 DIAGNOSIS — Z79890 Hormone replacement therapy (postmenopausal): Secondary | ICD-10-CM | POA: Diagnosis not present

## 2018-07-01 DIAGNOSIS — Z811 Family history of alcohol abuse and dependence: Secondary | ICD-10-CM | POA: Diagnosis not present

## 2018-07-01 DIAGNOSIS — F1010 Alcohol abuse, uncomplicated: Secondary | ICD-10-CM | POA: Diagnosis present

## 2018-07-01 DIAGNOSIS — R4182 Altered mental status, unspecified: Secondary | ICD-10-CM | POA: Diagnosis present

## 2018-07-01 DIAGNOSIS — F17210 Nicotine dependence, cigarettes, uncomplicated: Secondary | ICD-10-CM | POA: Diagnosis present

## 2018-07-01 DIAGNOSIS — F1410 Cocaine abuse, uncomplicated: Secondary | ICD-10-CM | POA: Diagnosis present

## 2018-07-01 DIAGNOSIS — I160 Hypertensive urgency: Secondary | ICD-10-CM | POA: Diagnosis present

## 2018-07-01 DIAGNOSIS — I674 Hypertensive encephalopathy: Secondary | ICD-10-CM | POA: Diagnosis present

## 2018-07-01 DIAGNOSIS — Z79899 Other long term (current) drug therapy: Secondary | ICD-10-CM | POA: Diagnosis not present

## 2018-07-01 DIAGNOSIS — F1210 Cannabis abuse, uncomplicated: Secondary | ICD-10-CM | POA: Diagnosis present

## 2018-07-01 DIAGNOSIS — J449 Chronic obstructive pulmonary disease, unspecified: Secondary | ICD-10-CM | POA: Diagnosis present

## 2018-07-01 DIAGNOSIS — R569 Unspecified convulsions: Secondary | ICD-10-CM | POA: Diagnosis not present

## 2018-07-01 DIAGNOSIS — F329 Major depressive disorder, single episode, unspecified: Secondary | ICD-10-CM | POA: Diagnosis present

## 2018-07-01 LAB — CBC
HCT: 48.4 % (ref 39.0–52.0)
Hemoglobin: 16.5 g/dL (ref 13.0–17.0)
MCH: 30.4 pg (ref 26.0–34.0)
MCHC: 34.1 g/dL (ref 30.0–36.0)
MCV: 89.3 fL (ref 80.0–100.0)
Platelets: 223 10*3/uL (ref 150–400)
RBC: 5.42 MIL/uL (ref 4.22–5.81)
RDW: 14.3 % (ref 11.5–15.5)
WBC: 8.6 10*3/uL (ref 4.0–10.5)
nRBC: 0 % (ref 0.0–0.2)

## 2018-07-01 LAB — BASIC METABOLIC PANEL
Anion gap: 7 (ref 5–15)
BUN: 11 mg/dL (ref 6–20)
CO2: 29 mmol/L (ref 22–32)
Calcium: 8.7 mg/dL — ABNORMAL LOW (ref 8.9–10.3)
Chloride: 103 mmol/L (ref 98–111)
Creatinine, Ser: 0.75 mg/dL (ref 0.61–1.24)
GFR calc Af Amer: >60 mL/min (ref >60)
GFR calc non Af Amer: >60 mL/min (ref >60)
Glucose, Bld: 114 mg/dL — ABNORMAL HIGH (ref 70–99)
Potassium: 3.3 mmol/L — ABNORMAL LOW (ref 3.5–5.1)
Sodium: 139 mmol/L (ref 135–145)

## 2018-07-01 LAB — PHENYTOIN LEVEL, TOTAL: Phenytoin Lvl: 17.4 ug/mL (ref 10.0–20.0)

## 2018-07-01 MED ORDER — ARFORMOTEROL TARTRATE 15 MCG/2ML IN NEBU
15.0000 ug | INHALATION_SOLUTION | Freq: Two times a day (BID) | RESPIRATORY_TRACT | Status: DC
  Administered 2018-07-01 – 2018-07-02 (×2): 15 ug via RESPIRATORY_TRACT
  Filled 2018-07-01 (×4): qty 2

## 2018-07-01 MED ORDER — PROCHLORPERAZINE EDISYLATE 10 MG/2ML IJ SOLN
10.0000 mg | Freq: Once | INTRAMUSCULAR | Status: DC
  Filled 2018-07-01: qty 2

## 2018-07-01 MED ORDER — LISINOPRIL 20 MG PO TABS: 20.0000 mg | ORAL_TABLET | Freq: Every day | ORAL | 0 refills | Status: DC

## 2018-07-01 MED ORDER — ALBUTEROL SULFATE (2.5 MG/3ML) 0.083% IN NEBU: 2.5000 mg | INHALATION_SOLUTION | RESPIRATORY_TRACT | Status: DC | PRN

## 2018-07-01 MED ORDER — LORAZEPAM 2 MG/ML IJ SOLN: 0.0000 mg | Freq: Two times a day (BID) | INTRAMUSCULAR | Status: DC

## 2018-07-01 MED ORDER — LORAZEPAM 2 MG/ML IJ SOLN
0.0000 mg | Freq: Four times a day (QID) | INTRAMUSCULAR | Status: DC
  Administered 2018-07-01: 4 mg via INTRAVENOUS
  Administered 2018-07-02: 08:00:00 2 mg via INTRAVENOUS
  Filled 2018-07-01: qty 1
  Filled 2018-07-01: qty 2

## 2018-07-01 MED ORDER — SIMVASTATIN 20 MG PO TABS
20.0000 mg | ORAL_TABLET | Freq: Every day | ORAL | Status: DC
  Administered 2018-07-01 – 2018-07-02 (×2): 20 mg via ORAL
  Filled 2018-07-01 (×2): qty 1

## 2018-07-01 MED ORDER — LISINOPRIL 20 MG PO TABS
20.0000 mg | ORAL_TABLET | Freq: Every day | ORAL | Status: DC
  Administered 2018-07-01 – 2018-07-02 (×2): 20 mg via ORAL
  Filled 2018-07-01 (×2): qty 1

## 2018-07-01 MED ORDER — UMECLIDINIUM BROMIDE 62.5 MCG/INH IN AEPB
1.0000 | INHALATION_SPRAY | Freq: Every day | RESPIRATORY_TRACT | Status: DC
  Administered 2018-07-01 – 2018-07-02 (×2): 1 via RESPIRATORY_TRACT
  Filled 2018-07-01: qty 7

## 2018-07-01 NOTE — Progress Notes (Signed)
At approximately 0700a, patient began exhibiting what appeared to be possible seizure-like activity. Patient was given 2 mg lorazepam IV for seizures and 4mg  ondansetron IV for nausea per existing provider orders. Charge Nurse Molli Posey) was called to the bedside and the MD was paged. The IV lorazepam began to take effect and the patient began to respond favorably to the treatment. The MD [facility]) was updated on the patient's status. No new orders at this time. Report given to day shift RN who will continue to monitor.

## 2018-07-01 NOTE — Discharge Summary (Signed)
[employer] [facility] [place] at [facility]   PATIENT NAME: Rodney Watson    MR#:  [id number]  DATE OF BIRTH:  Sep 25, 1965  DATE OF ADMISSION:  06/29/2018   ADMITTING PHYSICIAN: Campbell Stall, MD  DATE OF DISCHARGE: 07/01/18  PRIMARY CARE PHYSICIAN: Patient, No Pcp Per   ADMISSION DIAGNOSIS:  Hypertensive encephalopathy [I67.4] Hypokalemia [E87.6] Marijuana abuse [F12.10] Seizure disorder (HCC) [G40.909] Hypertensive urgency [I16.0] DISCHARGE DIAGNOSIS:  Active Problems:   Seizures (HCC)  SECONDARY DIAGNOSIS:   Past Medical History:  Diagnosis Date   Alcohol abuse    Alcohol abuse 04/01/2017   COPD (chronic obstructive pulmonary disease) (HCC)    Crack cocaine use    Depression    ETOH abuse    Seizures [facility])    HOSPITAL COURSE:   Gina is a 53 year old male who presented to the ED with somnolence.  He was given Narcan by EMS and then became physically aggressive.  In the ED, he was noted to have a brief tonic-clonic seizure lasting less than 1 minute.  He was given 1 mg IV Ativan and a dose of IV fosphenytoin.  He was admitted for further management.  Tonic-clonic seizure in known seizure disorder- Dilantin level low on admission. -Seen by neurology, who recommended continuing Dilantin at current dose -Needs to follow-up with neurology as an outpatient  Altered mental status- resolved.  May be due to postictal state receiving Ativan.  CT head negative.  UDS positive for cannabis and TCAs.  Uncontrolled hypertension- blood pressure initially elevated in the 200s systolic -Initially treated with nitroglycerin drip, but blood pressures improved -Lisinopril dose increased from  daily to  daily -Needs BP rechecked as an outpatient  Hypokalemia -Repleted -Needs BMP rechecked as an outpatient  Elevated acetaminophen level- found on admission. States he does not recall taking acetaminophen. -Tylenol level initially 98, improved to  29.  Hypothyroidism-stable -Continued home Synthroid  Depression-stable -Continued Prozac and Seroquel  Polysubstance abuse-patient is a known abuser of alcohol, tobacco, crack cocaine.  UDS positive for cannabis and TCAs. -Needs continued substance abuse counseling as an outpatient  DISCHARGE CONDITIONS:  Seizure disorder Uncontrolled hypertension Hypothyroidism Depression Polysubstance abuse CONSULTS OBTAINED:  Treatment Team:  Pauletta Browns, MD DRUG ALLERGIES:   Allergies  Allergen Reactions   Chocolate Hives   Vicodin [Hydrocodone-Acetaminophen] Nausea And Vomiting   DISCHARGE MEDICATIONS:   Allergies as of 07/01/2018      Reactions   Chocolate Hives   Vicodin [hydrocodone-acetaminophen] Nausea And Vomiting      Medication List    TAKE these medications   albuterol 108 (90 Base) MCG/ACT inhaler Commonly known as:  VENTOLIN HFA Inhale 2 puffs into the lungs every 4 (four) hours as needed for wheezing or shortness of breath.   FLUoxetine 20 MG capsule Commonly known as:  PROZAC Take 1 capsule (20 mg total) by mouth daily.   gabapentin 300 MG capsule Commonly known as:  NEURONTIN Take 1 capsule (300 mg total) by mouth 3 (three) times daily.   levothyroxine 25 MCG tablet Commonly known as:  SYNTHROID Take 1 tablet (25 mcg total) by mouth daily before breakfast.   lisinopril 20 MG tablet Commonly known as:  ZESTRIL Take 1 tablet (20 mg total) by mouth daily. Start taking on:  Jul 02, 2018 What changed:    medication strength  how much to take  additional instructions   meloxicam 7.5 MG tablet Commonly known as:  MOBIC Take 7.5 mg by mouth 2 (two) times daily.  phenytoin 100 MG ER capsule Commonly known as:  DILANTIN Take 1 capsule (100 mg total) by mouth 3 (three) times daily.   QUEtiapine 200 MG tablet Commonly known as:  SEROQUEL Take 1 tablet (200 mg total) by mouth at bedtime.   simvastatin 20 MG tablet Commonly known as:   ZOCOR Take 20 mg by mouth daily.   Stiolto Respimat 2.5-2.5 MCG/ACT Aers Generic drug:  Tiotropium Bromide-Olodaterol Inhale 2 puffs into the lungs daily.        DISCHARGE INSTRUCTIONS:  1.  Follow-up with PCP in 5 days 2.  Follow-up with neurology in 2 weeks 3.  Lisinopril dose increased to 20 mg daily 4.  Needs BMP rechecked as an outpatient 5.  Needs blood pressures monitored closely as an outpatient 6.  Needs substance abuse counseling as an outpatient DIET:  Regular diet DISCHARGE CONDITION:  Stable ACTIVITY:  Activity as tolerated OXYGEN:  Home Oxygen: No.  Oxygen Delivery: room air DISCHARGE LOCATION:  nursing home   If you experience worsening of your admission symptoms, develop shortness of breath, life threatening emergency, suicidal or homicidal thoughts you must seek medical attention immediately by calling 911 or calling your MD immediately  if symptoms less severe.  You Must read complete instructions/literature along with all the possible adverse reactions/side effects for all the Medicines you take and that have been prescribed to you. Take any new Medicines after you have completely understood and accpet all the possible adverse reactions/side effects.   Please note  You were cared for by a hospitalist during your hospital stay. If you have any questions about your discharge medications or the care you received while you were in the hospital after you are discharged, you can call the unit and asked to speak with the hospitalist on call if the hospitalist that took care of you is not available. Once you are discharged, your primary care physician will handle any further medical issues. Please note that NO REFILLS for any discharge medications will be authorized once you are discharged, as it is imperative that you return to your primary care physician (or establish a relationship with a primary care physician if you do not have one) for your aftercare needs so that  they can reassess your need for medications and monitor your lab values.    On the day of Discharge:  VITAL SIGNS:  Blood pressure (!) 196/114, pulse 77, temperature 98.3 F (36.8 C), temperature source Oral, resp. rate 20, height 5\' 8"  (1.727 m), weight 51.4 kg, SpO2 97 %. PHYSICAL EXAMINATION:  GENERAL:  53 y.o.-year-old patient lying in the bed with no acute distress.  Mildly disheveled appearing. EYES: Pupils equal, round, reactive to light and accommodation. No scleral icterus. Extraocular muscles intact.  HEENT: Head atraumatic, normocephalic. Oropharynx and nasopharynx clear.  NECK:  Supple, no jugular venous distention. No thyroid enlargement, no tenderness.  LUNGS: Normal breath sounds bilaterally, no wheezing, rales,rhonchi or crepitation. No use of accessory muscles of respiration.  CARDIOVASCULAR: RRR, S1, S2 normal. No murmurs, rubs, or gallops.  ABDOMEN: Soft, non-tender, non-distended. Bowel sounds present. No organomegaly or mass.  EXTREMITIES: No pedal edema, cyanosis, or clubbing.  NEUROLOGIC: Cranial nerves II through XII are intact. Muscle strength 5/5 in all extremities. Sensation intact. Gait not checked.  PSYCHIATRIC: The patient is alert and oriented x 3.  SKIN: No obvious rash, lesion, or ulcer.  DATA REVIEW:   CBC Recent Labs  Lab 07/01/18 0554  WBC 8.6  HGB 16.5  HCT 48.4  PLT 223    Chemistries  Recent Labs  Lab 06/30/18 0111 07/01/18 0554  NA 136 139  K 3.1* 3.3*  CL 107 103  CO2 22 29  GLUCOSE 118* 114*  BUN 20 11  CREATININE 0.73 0.75  CALCIUM 7.6* 8.7*  AST 23  --   ALT 25  --   ALKPHOS 69  --   BILITOT 0.3  --      Microbiology Results  Results for orders placed or performed during the hospital encounter of 06/29/18  SARS Coronavirus 2 (CEPHEID - Performed in [facility] hospital lab), Hosp Order     Status: None   Collection Time: 06/30/18  1:11 AM  Result Value Ref Range Status   SARS Coronavirus 2 NEGATIVE NEGATIVE Final     Comment: (NOTE) If result is NEGATIVE SARS-CoV-2 target nucleic acids are NOT DETECTED. The SARS-CoV-2 RNA is generally detectable in upper and lower  respiratory specimens during the acute phase of infection. The lowest  concentration of SARS-CoV-2 viral copies this assay can detect is 250  copies / mL. A negative result does not preclude SARS-CoV-2 infection  and should not be used as the sole basis for treatment or other  patient management decisions.  A negative result may occur with  improper specimen collection / handling, submission of specimen other  than nasopharyngeal swab, presence of viral mutation(s) within the  areas targeted by this assay, and inadequate number of viral copies  (<250 copies / mL). A negative result must be combined with clinical  observations, patient history, and epidemiological information. If result is POSITIVE SARS-CoV-2 target nucleic acids are DETECTED. The SARS-CoV-2 RNA is generally detectable in upper and lower  respiratory specimens dur ing the acute phase of infection.  Positive  results are indicative of active infection with SARS-CoV-2.  Clinical  correlation with patient history and other diagnostic information is  necessary to determine patient infection status.  Positive results do  not rule out bacterial infection or co-infection with other viruses. If result is PRESUMPTIVE POSTIVE SARS-CoV-2 nucleic acids MAY BE PRESENT.   A presumptive positive result was obtained on the submitted specimen  and confirmed on repeat testing.  While 2019 novel coronavirus  (SARS-CoV-2) nucleic acids may be present in the submitted sample  additional confirmatory testing may be necessary for epidemiological  and / or clinical management purposes  to differentiate between  SARS-CoV-2 and other Sarbecovirus currently known to infect humans.  If clinically indicated additional testing with an alternate test  methodology [phone]) is advised. The SARS-CoV-2  RNA is generally  detectable in upper and lower respiratory sp ecimens during the acute  phase of infection. The expected result is Negative. Fact Sheet for Patients:  BoilerBrush.com.cy Fact Sheet for Healthcare Providers: https://pope.com/ This test is not yet approved or cleared by the [place] FDA and has been authorized for detection and/or diagnosis of SARS-CoV-2 by FDA under an Emergency Use Authorization (EUA).  This EUA will remain in effect (meaning this test can be used) for the duration of the COVID-19 declaration under Section 564(b)(1) of the Act, 21 U.S.C. section 360bbb-3(b)(1), unless the authorization is terminated or revoked sooner. Performed at [facility], [address], [place], [place] [address]     RADIOLOGY:  No results found.   Management plans discussed with the patient, family and they are in agreement.  CODE STATUS: Full Code   TOTAL TIME TAKING CARE OF THIS PATIENT: 45 minutes.    Jinny Blossom  Terral Cooks M.D on 07/01/2018 at 11:55 AM  Between 7am to 6pm - Pager - [phone]  After 6pm go to [email or web address] - Social research officer, governmentpassword [email or web address] [email or web address]  [facility] [place] [facility]  Office  [phone]  CC: Primary care physician; Patient, No Pcp Per   Note: This dictation was prepared with Dragon dictation along with smaller phrase technology. Any transcriptional errors that result from this process are unintentional.

## 2018-07-01 NOTE — Plan of Care (Signed)
  Problem: Clinical Measurements: Goal: Complications related to the disease process, condition or treatment will be avoided or minimized Outcome: Progressing   Problem: Education: Goal: Knowledge of General Education information will improve Description Including pain rating scale, medication(s)/side effects and non-pharmacologic comfort measures Outcome: Progressing   Problem: Clinical Measurements: Goal: Ability to maintain clinical measurements within normal limits will improve Outcome: Progressing   Problem: Safety: Goal: Ability to remain free from injury will improve Outcome: Progressing

## 2018-07-01 NOTE — Discharge Instructions (Signed)
It was so nice to meet you during this hospitalization!  You came into the hospital with seizures. We did not make any changes to your dilantin. Please keep taking this as prescribed.  I have increased your lisinopril dose to 20 mg daily, because you had high blood pressures during this hospitalization.  Please make sure you pick up this prescription from your pharmacy.  Please make sure you schedule a new patient appointment with the neurology clinic in town.  Take care, Dr. Nancy Marus

## 2018-07-01 NOTE — Consult Note (Signed)
Reason for Consult:seizures  Referring Physician: Dr. Nancy Marus   CC: seizure activity   HPI: Rodney Watson is an 53 y.o. male with a known history of seizure disorder, depression, COPD, alcohol abuse, crack cocaine abuse who was brought to the ED by EMS after being found near the [employer].  He initially appeared somewhat somnolent, and was given Narcan by EMS.  He then became physically aggressive.  On arrival to the ED, he was calm and following commands.  He did have a brief tonic-clonic seizure lasting less than 1 minute.  Now back to baseline, but states he is having tremors.    Past Medical History:  Diagnosis Date  . Alcohol abuse   . Alcohol abuse 04/01/2017  . COPD (chronic obstructive pulmonary disease) (HCC)   . Crack cocaine use   . Depression   . ETOH abuse   . Seizures (HCC)     Past Surgical History:  Procedure Laterality Date  . BACK SURGERY      Family History  Problem Relation Age of Onset  . Alcohol abuse Father   . Seizures Neg Hx     Social History:  reports that he has been smoking cigarettes. He has a 58.50 pack-year smoking history. His smokeless tobacco use includes chew. He reports current alcohol use. He reports current drug use. Drugs: Marijuana and Cocaine.  Allergies  Allergen Reactions  . Chocolate Hives  . Vicodin [Hydrocodone-Acetaminophen] Nausea And Vomiting    Medications: I have reviewed the patient's current medications.  ROS: History obtained from the patient  General ROS: negative for - chills, fatigue, fever, night sweats, weight gain or weight loss Psychological ROS: negative for - behavioral disorder, hallucinations, memory difficulties, mood swings or suicidal ideation Ophthalmic ROS: negative for - blurry vision, double vision, eye pain or loss of vision ENT ROS: negative for - epistaxis, nasal discharge, oral lesions, sore throat, tinnitus or vertigo Allergy and Immunology ROS: negative for - hives or itchy/watery  eyes Hematological and Lymphatic ROS: negative for - bleeding problems, bruising or swollen lymph nodes Endocrine ROS: negative for - galactorrhea, hair pattern changes, polydipsia/polyuria or temperature intolerance Respiratory ROS: negative for - cough, hemoptysis, shortness of breath or wheezing Cardiovascular ROS: negative for - chest pain, dyspnea on exertion, edema or irregular heartbeat Gastrointestinal ROS: negative for - abdominal pain, diarrhea, hematemesis, nausea/vomiting or stool incontinence Genito-Urinary ROS: negative for - dysuria, hematuria, incontinence or urinary frequency/urgency Musculoskeletal ROS: negative for - joint swelling or muscular weakness Neurological ROS: as noted in HPI Dermatological ROS: negative for rash and skin lesion changes  Physical Examination: Blood pressure (!) 196/114, pulse 77, temperature 98.3 F (36.8 C), temperature source Oral, resp. rate 20, height  (1.727 m), weight 51.4 kg, SpO2 97 %.   Neurological Examination   Mental Status: Alert, oriented, thought content appropriate.  Speech fluent without evidence of aphasia.  Able to follow 3 step commands without difficulty. Cranial Nerves: II: Discs flat bilaterally; Visual fields grossly normal, pupils equal, round, reactive to light and accommodation III,IV, VI: ptosis not present, extra-ocular motions intact bilaterally V,VII: smile symmetric, facial light touch sensation normal bilaterally VIII: hearing normal bilaterally IX,X: gag reflex present XI: bilateral shoulder shrug XII: midline tongue extension Motor: Mild generalized weakness bilaterally  Tone and bulk:normal tone throughout; no atrophy noted Sensory: Pinprick and light touch intact throughout, bilaterally Deep Tendon Reflexes: 1+ and symmetric throughout Plantars: Right: downgoing   Left: downgoing Cerebellar: Not tested  Gait: not tested  Laboratory Studies:   Basic Metabolic Panel: Recent Labs  Lab  06/27/18 1433 06/30/18 0111 07/01/18 0554  NA 138 136 139  K 3.9 3.1* 3.3*  CL 104 107 103  CO2 GLUCOSE 114* 118* 114*  BUN CREATININE 0.75 0.73 0.75  CALCIUM 8.6* 7.6* 8.7*    Liver Function Tests: Recent Labs  Lab 06/30/18 0111  AST 23  ALT 25  ALKPHOS 69  BILITOT 0.3  PROT 6.4*  ALBUMIN 4.2   Recent Labs  Lab 06/27/18 1433  LIPASE 23   No results for input(s): AMMONIA in the last 168 hours.  CBC: Recent Labs  Lab 06/27/18 1433 06/30/18 0111 07/01/18 0554  WBC 7.8 5.4 8.6  NEUTROABS 6.3 3.5  --   HGB 16.0 13.7 16.5  HCT 48.8 40.3 48.4  MCV 92.8 88.6 89.3  PLT 227 176 223    Cardiac Enzymes: Recent Labs  Lab 06/27/18 1732 06/30/18 0111  TROPONINI <0.03 <0.03    BNP: Invalid input(s): POCBNP  CBG: Recent Labs  Lab 06/30/18 0623  GLUCAP 124*    Microbiology: Results for orders placed or performed during the hospital encounter of 06/29/18  SARS Coronavirus 2 (CEPHEID - Performed in [facility] hospital lab), Hosp Order     Status: None   Collection Time: 06/30/18  1:11 AM  Result Value Ref Range Status   SARS Coronavirus 2 NEGATIVE NEGATIVE Final    Comment: (NOTE) If result is NEGATIVE SARS-CoV-2 target nucleic acids are NOT DETECTED. The SARS-CoV-2 RNA is generally detectable in upper and lower  respiratory specimens during the acute phase of infection. The lowest  concentration of SARS-CoV-2 viral copies this assay can detect is 250  copies / mL. A negative result does not preclude SARS-CoV-2 infection  and should not be used as the sole basis for treatment or other  patient management decisions.  A negative result may occur with  improper specimen collection / handling, submission of specimen other  than nasopharyngeal swab, presence of viral mutation(s) within the  areas targeted by this assay, and inadequate number of viral copies  (<250 copies / mL). A negative result must be combined with clinical   observations, patient history, and epidemiological information. If result is POSITIVE SARS-CoV-2 target nucleic acids are DETECTED. The SARS-CoV-2 RNA is generally detectable in upper and lower  respiratory specimens dur ing the acute phase of infection.  Positive  results are indicative of active infection with SARS-CoV-2.  Clinical  correlation with patient history and other diagnostic information is  necessary to determine patient infection status.  Positive results do  not rule out bacterial infection or co-infection with other viruses. If result is PRESUMPTIVE POSTIVE SARS-CoV-2 nucleic acids MAY BE PRESENT.   A presumptive positive result was obtained on the submitted specimen  and confirmed on repeat testing.  While 2019 novel coronavirus  (SARS-CoV-2) nucleic acids may be present in the submitted sample  additional confirmatory testing may be necessary for epidemiological  and / or clinical management purposes  to differentiate between  SARS-CoV-2 and other Sarbecovirus currently known to infect humans.  If clinically indicated additional testing with an alternate test  methodology [phone]) is advised. The SARS-CoV-2 RNA is generally  detectable in upper and lower respiratory sp ecimens during the acute  phase of infection. The expected result is Negative. Fact Sheet for Patients:  BoilerBrush.com.cy Fact Sheet for Healthcare Providers: https://pope.com/ This test is not yet approved or cleared by the  [place] FDA and has been authorized for detection and/or diagnosis of SARS-CoV-2 by FDA under an TEFL teachermergency Use Authorization (EUA).  This EUA will remain in effect (meaning this test can be used) for the duration of the COVID-19 declaration under Section 564(b)(1) of the Act, 21 U.S.C. section 360bbb-3(b)(1), unless the authorization is terminated or revoked sooner. Performed at [facility], [address] [place], [place] [id number]     Coagulation Studies: No results for input(s): LABPROT, INR in the last 72 hours.  Urinalysis:  Recent Labs  Lab 06/27/18 1433 06/30/18 0111  COLORURINE YELLOW* YELLOW*  LABSPEC 1.041* 1.021  PHURINE 5.0 5.0  GLUCOSEU NEGATIVE NEGATIVE  HGBUR NEGATIVE NEGATIVE  BILIRUBINUR NEGATIVE NEGATIVE  KETONESUR 20* NEGATIVE  PROTEINUR NEGATIVE NEGATIVE  NITRITE NEGATIVE NEGATIVE  LEUKOCYTESUR NEGATIVE NEGATIVE    Lipid Panel:  No results found for: CHOL, TRIG, HDL, CHOLHDL, VLDL, LDLCALC  HgbA1C:  Lab Results  Component Value Date   HGBA1C 5.1 01/21/2018    Urine Drug Screen:      Component Value Date/Time   LABOPIA NONE DETECTED 06/30/2018 0111   LABOPIA NONE DETECTED 08/15/2017 1707   COCAINSCRNUR NONE DETECTED 06/30/2018 0111   LABBENZ NONE DETECTED 06/30/2018 0111   LABBENZ NONE DETECTED 08/15/2017 1707   AMPHETMU NONE DETECTED 06/30/2018 0111   AMPHETMU NONE DETECTED 08/15/2017 1707   THCU POSITIVE (A) 06/30/2018 0111   THCU POSITIVE (A) 08/15/2017 1707   LABBARB NONE DETECTED 06/30/2018 0111   LABBARB (A) 08/15/2017 1707    Result not available. Reagent lot number recalled by manufacturer.    Alcohol Level:  Recent Labs  Lab 06/27/18 1433 06/30/18 0111  ETH <10 <10    Other results: EKG: normal EKG, normal sinus rhythm, unchanged from previous tracings.  Imaging: Ct Head Wo Contrast  Result Date: 06/30/2018 CLINICAL DATA:  Altered mental status and seizure. EXAM: CT HEAD WITHOUT CONTRAST TECHNIQUE: Contiguous axial images were obtained from the base of the skull through the vertex without intravenous contrast. COMPARISON:  01/21/2018 head CT FINDINGS: Brain: There is no mass, hemorrhage or extra-axial collection. Mild generalized volume loss, unchanged. The brain parenchyma is normal, without acute or chronic infarction. Vascular: No abnormal hyperdensity of the major intracranial arteries or dural venous sinuses. No  intracranial atherosclerosis. Skull: The visualized skull base, calvarium and extracranial soft tissues are normal. Sinuses/Orbits: No fluid levels or advanced mucosal thickening of the visualized paranasal sinuses. No mastoid or middle ear effusion. The orbits are normal. IMPRESSION: Unchanged examination without acute abnormality. Mild generalized volume loss. Electronically Signed   By: Deatra RobinsonKevin  Herman M.D.   On: 06/30/2018 00:48   Dg Chest Port 1 View  Result Date: 06/30/2018 CLINICAL DATA:  Altered mental status. EXAM: PORTABLE CHEST 1 VIEW COMPARISON:  01/21/2018 FINDINGS: The heart size and mediastinal contours are within normal limits. Mild diffuse interstitial coarsening without pulmonary edema or focal consolidation. No pleural effusion or pneumothorax. The visualized skeletal structures are unremarkable. IMPRESSION: No active disease. Electronically Signed   By: Deatra RobinsonKevin  Herman M.D.   On: 06/30/2018 00:45     Assessment/Plan:   53 y.o. male with a known history of seizure disorder, depression, COPD, alcohol abuse, crack cocaine abuse who was brought to the ED by EMS [place] [employer] [place].  He initially appeared somewhat somnolent, and was given Narcan by EMS.  He then became physically aggressive.  On arrival to the ED, he was calm and following commands.  He did  have a brief tonic-clonic seizure lasting less than 1 minute.  Now back to baseline, but states he is having tremors.  -   Phenytoin level therapeutic of 17.4 - potentially proved seizure after Narcan - d/c planning today - No further imaging from Neurological stand point.   Pauletta Browns   07/01/2018, 11:52 AM

## 2018-07-01 NOTE — Progress Notes (Signed)
Pt c/o just  Not feeling well. C/o h/a.  No seizure activity noted/ not eating today pt reported nausea.  Spoke with caregiver this pm angie king who wanted to speak with the md regarding pt.  Dr Nancy Marus called  And she said she would  Call pt and to cancel discharge.

## 2018-07-01 NOTE — Progress Notes (Signed)
Ativan x 2 today/  zofran for c/o nausea and  Vomiting. Pt had tried to eat dinner  Then became nauseous.

## 2018-07-01 NOTE — Progress Notes (Signed)
Patient scored a 27 on the CIWA scale. Patient reports nausea/vomiting, pins, needles, and burning in his feet and legs, anxiety and agitation, a headache, and he has a discernable tremor when his arms are extended.  Provider on call  notified. Will administer 4 mg of lorazepam and 10 mg of compazine. Higher education careers adviser notified. No Rapid Response called at this time due to patient not being in respiratory distress, patient not actively seizing, and patient is alert and oriented x 4 and responding/ communicating appropriately at this time. Will continue to monitor.

## 2018-07-02 ENCOUNTER — Inpatient Hospital Stay: Payer: Medicaid Other

## 2018-07-02 DIAGNOSIS — R569 Unspecified convulsions: Secondary | ICD-10-CM

## 2018-07-02 LAB — POTASSIUM: Potassium: 4.2 mmol/L (ref 3.5–5.1)

## 2018-07-02 LAB — MAGNESIUM: Magnesium: 2.1 mg/dL (ref 1.7–2.4)

## 2018-07-02 MED ORDER — ONDANSETRON 4 MG PO TBDP: 4.0000 mg | ORAL_TABLET | Freq: Three times a day (TID) | ORAL | 0 refills | Status: DC | PRN

## 2018-07-02 NOTE — Progress Notes (Signed)
Patient discharged home per MD order. Patient will make follow up appointment with neurology. All discharge instructions given and all questions answered.

## 2018-07-02 NOTE — Discharge Summary (Signed)
[facility] [place] [facility]   PATIENT NAME: Rodney Watson    MR#:  [id number]  DATE OF BIRTH:  12/20/1965  DATE OF ADMISSION:  06/29/2018   ADMITTING PHYSICIAN: Campbell Stall, MD  DATE OF DISCHARGE: 07/02/18  PRIMARY CARE PHYSICIAN: Patient, No Pcp Per   ADMISSION DIAGNOSIS:  Hypertensive encephalopathy [I67.4] Hypokalemia [E87.6] Marijuana abuse [F12.10] Seizure disorder (HCC) [G40.909] Hypertensive urgency [I16.0] DISCHARGE DIAGNOSIS:  Active Problems:   Seizures (HCC)  SECONDARY DIAGNOSIS:   Past Medical History:  Diagnosis Date  . Alcohol abuse   . Alcohol abuse 04/01/2017  . COPD (chronic obstructive pulmonary disease) (HCC)   . Crack cocaine use   . Depression   . ETOH abuse   . Seizures [facility])    HOSPITAL COURSE:   Rodney Watson is a 53 year old male who presented to the ED with somnolence.  He was given Narcan by EMS and then became physically aggressive.  In the ED, he was noted to have a brief tonic-clonic seizure lasting less than 1 minute.  He was given 1 mg IV Ativan and a dose of IV fosphenytoin.  He was admitted for further management.  Tonic-clonic seizure in known seizure disorder- Dilantin level low on admission. -Seen by neurology, who recommended continuing Dilantin at current dose -Needs to follow-up with neurology as an outpatient  Altered mental status- resolved.  May be due to postictal state receiving Ativan.  CT head negative.  UDS positive for cannabis and TCAs.  Uncontrolled hypertension- blood pressure initially elevated in the 200s systolic -Initially treated with nitroglycerin drip, but blood pressures improved -Lisinopril dose increased from  daily to  daily -Needs BP rechecked as an outpatient  Nausea- noted yesterday. Resolved this morning. May be related to marijuana use. -Abdominal x-ray this morning was negative -Prescription for zofran given on discharge  Hypokalemia -Repleted -Needs BMP  rechecked as an outpatient  Elevated acetaminophen level- found on admission. States he does not recall taking acetaminophen. -Tylenol level initially 98, improved to 29.  Hypothyroidism- stable -Continued home Synthroid  Depression- stable -Continued Prozac and Seroquel  Polysubstance abuse- patient is a known abuser of alcohol, tobacco, crack cocaine.  UDS positive for cannabis and TCAs. -Needs continued substance abuse counseling as an outpatient  DISCHARGE CONDITIONS:  Seizure disorder Uncontrolled hypertension Hypothyroidism Depression Polysubstance abuse CONSULTS OBTAINED:  Treatment Team:  Pauletta Browns, MD DRUG ALLERGIES:   Allergies  Allergen Reactions  . Chocolate Hives  . Vicodin [Hydrocodone-Acetaminophen] Nausea And Vomiting   DISCHARGE MEDICATIONS:   Allergies as of 07/02/2018      Reactions   Chocolate Hives   Vicodin [hydrocodone-acetaminophen] Nausea And Vomiting      Medication List    TAKE these medications   albuterol 108 (90 Base) MCG/ACT inhaler Commonly known as:  VENTOLIN HFA Inhale 2 puffs into the lungs every 4 (four) hours as needed for wheezing or shortness of breath.   FLUoxetine 20 MG capsule Commonly known as:  PROZAC Take 1 capsule (20 mg total) by mouth daily.   gabapentin 300 MG capsule Commonly known as:  NEURONTIN Take 1 capsule (300 mg total) by mouth 3 (three) times daily.   levothyroxine 25 MCG tablet Commonly known as:  SYNTHROID Take 1 tablet (25 mcg total) by mouth daily before breakfast.   lisinopril 20 MG tablet Commonly known as:  ZESTRIL Take 1 tablet (20 mg total) by mouth daily. What changed:    medication strength  how much to take  additional  instructions   meloxicam 7.5 MG tablet Commonly known as:  MOBIC Take 7.5 mg by mouth 2 (two) times daily.   ondansetron 4 MG disintegrating tablet Commonly known as:  Zofran ODT Take 1 tablet (4 mg total) by mouth every 8 (eight) hours as needed for  nausea or vomiting.   phenytoin 100 MG ER capsule Commonly known as:  DILANTIN Take 1 capsule (100 mg total) by mouth 3 (three) times daily.   QUEtiapine 200 MG tablet Commonly known as:  SEROQUEL Take 1 tablet (200 mg total) by mouth at bedtime.   simvastatin 20 MG tablet Commonly known as:  ZOCOR Take 20 mg by mouth daily.   Stiolto Respimat 2.5-2.5 MCG/ACT Aers Generic drug:  Tiotropium Bromide-Olodaterol Inhale 2 puffs into the lungs daily.        DISCHARGE INSTRUCTIONS:  1.  Follow-up with PCP in 5 days 2.  Follow-up with neurology in 2 weeks 3.  Lisinopril dose increased to 20 mg daily 4.  Needs BMP rechecked as an outpatient 5.  Needs blood pressures monitored closely as an outpatient 6.  Needs substance abuse counseling as an outpatient DIET:  Regular diet DISCHARGE CONDITION:  Stable ACTIVITY:  Activity as tolerated OXYGEN:  Home Oxygen: No.  Oxygen Delivery: room air DISCHARGE LOCATION:  nursing home   If you experience worsening of your admission symptoms, develop shortness of breath, life threatening emergency, suicidal or homicidal thoughts you must seek medical attention immediately by calling 911 or calling your MD immediately  if symptoms less severe.  You Must read complete instructions/literature along with all the possible adverse reactions/side effects for all the Medicines you take and that have been prescribed to you. Take any new Medicines after you have completely understood and accpet all the possible adverse reactions/side effects.   Please note  You were cared for by a hospitalist during your hospital stay. If you have any questions about your discharge medications or the care you received while you were in the hospital after you are discharged, you can call the unit and asked to speak with the hospitalist on call if the hospitalist that took care of you is not available. Once you are discharged, your primary care physician will handle any  further medical issues. Please note that NO REFILLS for any discharge medications will be authorized once you are discharged, as it is imperative that you return to your primary care physician (or establish a relationship with a primary care physician if you do not have one) for your aftercare needs so that they can reassess your need for medications and monitor your lab values.    On the day of Discharge:  VITAL SIGNS:  Blood pressure 108/79, pulse 85, temperature 98.7 F (37.1 C), temperature source Oral, resp. rate 15, height 5\' 8"  (1.727 m), weight 51.4 kg, SpO2 98 %. PHYSICAL EXAMINATION:  GENERAL:  53 y.o.-year-old patient lying in the bed with no acute distress.  Mildly disheveled appearing. EYES: Pupils equal, round, reactive to light and accommodation. No scleral icterus. Extraocular muscles intact.  HEENT: Head atraumatic, normocephalic. Oropharynx and nasopharynx clear.  NECK:  Supple, no jugular venous distention. No thyroid enlargement, no tenderness.  LUNGS: Normal breath sounds bilaterally, no wheezing, rales,rhonchi or crepitation. No use of accessory muscles of respiration.  CARDIOVASCULAR: RRR, S1, S2 normal. No murmurs, rubs, or gallops.  ABDOMEN: Soft, non-tender, non-distended. Bowel sounds present. No organomegaly or mass.  EXTREMITIES: No pedal edema, cyanosis, or clubbing.  NEUROLOGIC: Cranial nerves II through  XII are intact. Muscle strength 5/5 in all extremities. Sensation intact. Gait not checked.  PSYCHIATRIC: The patient is alert and oriented x 3.  SKIN: No obvious rash, lesion, or ulcer.  DATA REVIEW:   CBC Recent Labs  Lab 07/01/18 0554  WBC 8.6  HGB 16.5  HCT 48.4  PLT 223    Chemistries  Recent Labs  Lab 06/30/18 0111 07/01/18 0554 07/02/18 0534  NA 136 139  --   K 3.1* 3.3* 4.2  CL 107 103  --   CO2 22 29  --   GLUCOSE 118* 114*  --   BUN 20 11  --   CREATININE 0.73 0.75  --   CALCIUM 7.6* 8.7*  --   MG  --   --  2.1  AST 23  --   --    ALT 25  --   --   ALKPHOS 69  --   --   BILITOT 0.3  --   --      Microbiology Results  Results for orders placed or performed during the hospital encounter of 06/29/18  SARS Coronavirus 2 (CEPHEID - Performed in [facility] hospital lab), Hosp Order     Status: None   Collection Time: 06/30/18  1:11 AM  Result Value Ref Range Status   SARS Coronavirus 2 NEGATIVE NEGATIVE Final    Comment: (NOTE) If result is NEGATIVE SARS-CoV-2 target nucleic acids are NOT DETECTED. The SARS-CoV-2 RNA is generally detectable in upper and lower  respiratory specimens during the acute phase of infection. The lowest  concentration of SARS-CoV-2 viral copies this assay can detect is 250  copies / mL. A negative result does not preclude SARS-CoV-2 infection  and should not be used as the sole basis for treatment or other  patient management decisions.  A negative result may occur with  improper specimen collection / handling, submission of specimen other  than nasopharyngeal swab, presence of viral mutation(s) within the  areas targeted by this assay, and inadequate number of viral copies  (<250 copies / mL). A negative result must be combined with clinical  observations, patient history, and epidemiological information. If result is POSITIVE SARS-CoV-2 target nucleic acids are DETECTED. The SARS-CoV-2 RNA is generally detectable in upper and lower  respiratory specimens dur ing the acute phase of infection.  Positive  results are indicative of active infection with SARS-CoV-2.  Clinical  correlation with patient history and other diagnostic information is  necessary to determine patient infection status.  Positive results do  not rule out bacterial infection or co-infection with other viruses. If result is PRESUMPTIVE POSTIVE SARS-CoV-2 nucleic acids MAY BE PRESENT.   A presumptive positive result was obtained on the submitted specimen  and confirmed on repeat testing.  While 2019 novel  coronavirus  (SARS-CoV-2) nucleic acids may be present in the submitted sample  additional confirmatory testing may be necessary for epidemiological  and / or clinical management purposes  to differentiate between  SARS-CoV-2 and other Sarbecovirus currently known to infect humans.  If clinically indicated additional testing with an alternate test  methodology [phone]) is advised. The SARS-CoV-2 RNA is generally  detectable in upper and lower respiratory sp ecimens during the acute  phase of infection. The expected result is Negative. Fact Sheet for Patients:  BoilerBrush.com.cy Fact Sheet for Healthcare Providers: https://pope.com/ This test is not yet approved or cleared by the [place] FDA and has been authorized for detection and/or diagnosis of SARS-CoV-2 by FDA under  an Emergency Use Authorization (EUA).  This EUA will remain in effect (meaning this test can be used) for the duration of the COVID-19 declaration under Section 564(b)(1) of the Act, 21 U.S.C. section 360bbb-3(b)(1), unless the authorization is terminated or revoked sooner. Performed at [facility], [address], [place], [place] [address]     RADIOLOGY:  Cathleen Corti 1 View  Result Date: 07/02/2018 CLINICAL DATA:  Nausea. EXAM: ABDOMEN - 1 VIEW COMPARISON:  CT abdomen pelvis dated Jun 27, 2018. FINDINGS: The bowel gas pattern is normal. No radio-opaque calculi or other significant radiographic abnormality are seen. No acute osseous abnormality. The lung bases are clear. IMPRESSION: Negative. Electronically Signed   By: Obie Dredge M.D.   On: 07/02/2018 10:36     Management plans discussed with the patient, family and they are in agreement.  CODE STATUS: Full Code   TOTAL TIME TAKING CARE OF THIS PATIENT: 45 minutes.    Jinny Blossom Mayo M.D on 07/02/2018 at 1:38 PM  Between 7am to 6pm - Pager - [phone]  After 6pm go to [email or web address] - Geophysicist/field seismologist  [facility]  Office  [phone]  CC: Primary care physician; Patient, No Pcp Per   Note: This dictation was prepared with Dragon dictation along with smaller phrase technology. Any transcriptional errors that result from this process are unintentional.

## 2018-07-02 NOTE — Consult Note (Signed)
Subjective: No further seizures. High CIWA score  Past Medical History:  Diagnosis Date  . Alcohol abuse   . Alcohol abuse 04/01/2017  . COPD (chronic obstructive pulmonary disease) (HCC)   . Crack cocaine use   . Depression   . ETOH abuse   . Seizures (HCC)     Past Surgical History:  Procedure Laterality Date  . BACK SURGERY      Family History  Problem Relation Age of Onset  . Alcohol abuse Father   . Seizures Neg Hx     Social History:  reports that he has been smoking cigarettes. He has a 58.50 pack-year smoking history. His smokeless tobacco use includes chew. He reports current alcohol use. He reports current drug use. Drugs: Marijuana and Cocaine.  Allergies  Allergen Reactions  . Chocolate Hives  . Vicodin [Hydrocodone-Acetaminophen] Nausea And Vomiting    Medications: I have reviewed the patient's current medications.  ROS: History obtained from the patient  General ROS: negative for - chills, fatigue, fever, night sweats, weight gain or weight loss Psychological ROS: negative for - behavioral disorder, hallucinations, memory difficulties, mood swings or suicidal ideation Ophthalmic ROS: negative for - blurry vision, double vision, eye pain or loss of vision ENT ROS: negative for - epistaxis, nasal discharge, oral lesions, sore throat, tinnitus or vertigo Allergy and Immunology ROS: negative for - hives or itchy/watery eyes Hematological and Lymphatic ROS: negative for - bleeding problems, bruising or swollen lymph nodes Endocrine ROS: negative for - galactorrhea, hair pattern changes, polydipsia/polyuria or temperature intolerance Respiratory ROS: negative for - cough, hemoptysis, shortness of breath or wheezing Cardiovascular ROS: negative for - chest pain, dyspnea on exertion, edema or irregular heartbeat Gastrointestinal ROS: negative for - abdominal pain, diarrhea, hematemesis, nausea/vomiting or stool incontinence Genito-Urinary ROS: negative for -  dysuria, hematuria, incontinence or urinary frequency/urgency Musculoskeletal ROS: negative for - joint swelling or muscular weakness Neurological ROS: as noted in HPI Dermatological ROS: negative for rash and skin lesion changes  Physical Examination: Blood pressure 108/79, pulse 85, temperature 98.7 F (37.1 C), temperature source Oral, resp. rate 15, height 5\' 8"  (1.727 m), weight 51.4 kg, SpO2 98 %.   Neurological Examination   Mental Status: Alert, oriented, thought content appropriate.  Speech fluent without evidence of aphasia.  Able to follow 3 step commands without difficulty. Cranial Nerves: II: Discs flat bilaterally; Visual fields grossly normal, pupils equal, round, reactive to light and accommodation III,IV, VI: ptosis not present, extra-ocular motions intact bilaterally V,VII: smile symmetric, facial light touch sensation normal bilaterally VIII: hearing normal bilaterally IX,X: gag reflex present XI: bilateral shoulder shrug XII: midline tongue extension Motor: Mild generalized weakness bilaterally  Tone and bulk:normal tone throughout; no atrophy noted Sensory: Pinprick and light touch intact throughout, bilaterally Deep Tendon Reflexes: 1+ and symmetric throughout Plantars: Right: downgoing   Left: downgoing Cerebellar: Not tested  Gait: not tested      Laboratory Studies:   Basic Metabolic Panel: Recent Labs  Lab 06/27/18 1433 06/30/18 0111 07/01/18 0554 07/02/18 0534  NA 138 136 139  --   K 3.9 3.1* 3.3* 4.2  CL 104 107 103  --   CO2 22 22 29   --   GLUCOSE 114* 118* 114*  --   BUN 15 20 11   --   CREATININE 0.75 0.73 0.75  --   CALCIUM 8.6* 7.6* 8.7*  --   MG  --   --   --  2.1    Liver Function Tests: Recent  Labs  Lab 06/30/18 0111  AST 23  ALT 25  ALKPHOS 69  BILITOT 0.3  PROT 6.4*  ALBUMIN 4.2   Recent Labs  Lab 06/27/18 1433  LIPASE 23   No results for input(s): AMMONIA in the last 168 hours.  CBC: Recent Labs  Lab  06/27/18 1433 06/30/18 0111 07/01/18 0554  WBC 7.8 5.4 8.6  NEUTROABS 6.3 3.5  --   HGB 16.0 13.7 16.5  HCT 48.8 40.3 48.4  MCV 92.8 88.6 89.3  PLT 227 176 223    Cardiac Enzymes: Recent Labs  Lab 06/27/18 1732 06/30/18 0111  TROPONINI <0.03 <0.03    BNP: Invalid input(s): POCBNP  CBG: Recent Labs  Lab 06/30/18 0623  GLUCAP 124*    Microbiology: Results for orders placed or performed during the hospital encounter of 06/29/18  SARS Coronavirus 2 (CEPHEID - Performed in [facility] hospital lab), Hosp Order     Status: None   Collection Time: 06/30/18  1:11 AM  Result Value Ref Range Status   SARS Coronavirus 2 NEGATIVE NEGATIVE Final    Comment: (NOTE) If result is NEGATIVE SARS-CoV-2 target nucleic acids are NOT DETECTED. The SARS-CoV-2 RNA is generally detectable in upper and lower  respiratory specimens during the acute phase of infection. The lowest  concentration of SARS-CoV-2 viral copies this assay can detect is 250  copies / mL. A negative result does not preclude SARS-CoV-2 infection  and should not be used as the sole basis for treatment or other  patient management decisions.  A negative result may occur with  improper specimen collection / handling, submission of specimen other  than nasopharyngeal swab, presence of viral mutation(s) within the  areas targeted by this assay, and inadequate number of viral copies  (<250 copies / mL). A negative result must be combined with clinical  observations, patient history, and epidemiological information. If result is POSITIVE SARS-CoV-2 target nucleic acids are DETECTED. The SARS-CoV-2 RNA is generally detectable in upper and lower  respiratory specimens dur ing the acute phase of infection.  Positive  results are indicative of active infection with SARS-CoV-2.  Clinical  correlation with patient history and other diagnostic information is  necessary to determine patient infection status.  Positive results do   not rule out bacterial infection or co-infection with other viruses. If result is PRESUMPTIVE POSTIVE SARS-CoV-2 nucleic acids MAY BE PRESENT.   A presumptive positive result was obtained on the submitted specimen  and confirmed on repeat testing.  While 2019 novel coronavirus  (SARS-CoV-2) nucleic acids may be present in the submitted sample  additional confirmatory testing may be necessary for epidemiological  and / or clinical management purposes  to differentiate between  SARS-CoV-2 and other Sarbecovirus currently known to infect humans.  If clinically indicated additional testing with an alternate test  methodology [phone]) is advised. The SARS-CoV-2 RNA is generally  detectable in upper and lower respiratory sp ecimens during the acute  phase of infection. The expected result is Negative. Fact Sheet for Patients:  BoilerBrush.com.cy Fact Sheet for Healthcare Providers: https://pope.com/ This test is not yet approved or cleared by the [place] FDA and has been authorized for detection and/or diagnosis of SARS-CoV-2 by FDA under an Emergency Use Authorization (EUA).  This EUA will remain in effect (meaning this test can be used) for the duration of the COVID-19 declaration under Section 564(b)(1) of the Act, 21 U.S.C. section 360bbb-3(b)(1), unless the authorization is terminated or revoked sooner. Performed at [facility],  [address], [place], [place] [address]     Coagulation Studies: No results for input(s): LABPROT, INR in the last 72 hours.  Urinalysis:  Recent Labs  Lab 06/27/18 1433 06/30/18 0111  COLORURINE YELLOW* YELLOW*  LABSPEC 1.041* 1.021  PHURINE 5.0 5.0  GLUCOSEU NEGATIVE NEGATIVE  HGBUR NEGATIVE NEGATIVE  BILIRUBINUR NEGATIVE NEGATIVE  KETONESUR 20* NEGATIVE  PROTEINUR NEGATIVE NEGATIVE  NITRITE NEGATIVE NEGATIVE  LEUKOCYTESUR NEGATIVE NEGATIVE    Lipid Panel:  No results  found for: CHOL, TRIG, HDL, CHOLHDL, VLDL, LDLCALC  HgbA1C:  Lab Results  Component Value Date   HGBA1C 5.1 01/21/2018    Urine Drug Screen:      Component Value Date/Time   LABOPIA NONE DETECTED 06/30/2018 0111   LABOPIA NONE DETECTED 08/15/2017 1707   COCAINSCRNUR NONE DETECTED 06/30/2018 0111   LABBENZ NONE DETECTED 06/30/2018 0111   LABBENZ NONE DETECTED 08/15/2017 1707   AMPHETMU NONE DETECTED 06/30/2018 0111   AMPHETMU NONE DETECTED 08/15/2017 1707   THCU POSITIVE (A) 06/30/2018 0111   THCU POSITIVE (A) 08/15/2017 1707   LABBARB NONE DETECTED 06/30/2018 0111   LABBARB (A) 08/15/2017 1707    Result not available. Reagent lot number recalled by manufacturer.    Alcohol Level:  Recent Labs  Lab 06/27/18 1433 06/30/18 0111  ETH <10 <10    Other results: EKG: normal EKG, normal sinus rhythm, unchanged from previous tracings.  Imaging: Dg Abd 1 View  Result Date: 07/02/2018 CLINICAL DATA:  Nausea. EXAM: ABDOMEN - 1 VIEW COMPARISON:  CT abdomen pelvis dated Jun 27, 2018. FINDINGS: The bowel gas pattern is normal. No radio-opaque calculi or other significant radiographic abnormality are seen. No acute osseous abnormality. The lung bases are clear. IMPRESSION: Negative. Electronically Signed   By: Obie Dredge M.D.   On: 07/02/2018 10:36     Assessment/Plan:   53 y.o. male with a known history of seizure disorder, depression, COPD, alcohol abuse, crack cocaine abuse who was brought to the ED by EMS after being found near the [employer].  He initially appeared somewhat somnolent, and was given Narcan by EMS.  He then became physically aggressive.  On arrival to the ED, he was calm and following commands.  He did have a brief tonic-clonic seizure lasting less than 1 minute.  Now back to baseline, but states he is having tremors.  -   Phenytoin level therapeutic of 17.4 - potentially proved seizure after Narcan - d/c planning when appropriate from withdrawal  prospective  - No further imaging from Neurological stand point.   Pauletta Browns   07/02/2018, 11:26 AM

## 2018-07-31 ENCOUNTER — Ambulatory Visit: Payer: Medicaid Other | Admitting: Surgery

## 2018-08-28 ENCOUNTER — Other Ambulatory Visit: Payer: Self-pay

## 2018-08-28 ENCOUNTER — Encounter: Payer: Self-pay | Admitting: Surgery

## 2018-08-28 ENCOUNTER — Telehealth: Payer: Self-pay | Admitting: *Deleted

## 2018-08-28 ENCOUNTER — Encounter: Payer: Self-pay | Admitting: *Deleted

## 2018-08-28 ENCOUNTER — Ambulatory Visit: Payer: Medicaid Other | Admitting: Surgery

## 2018-08-28 VITALS — BP 147/105 | HR 83 | Temp 97.8°F | Ht 68.0 in | Wt 119.0 lb

## 2018-08-28 DIAGNOSIS — K4090 Unilateral inguinal hernia, without obstruction or gangrene, not specified as recurrent: Secondary | ICD-10-CM

## 2018-08-28 NOTE — Addendum Note (Signed)
Addended by: Riki Sheer on: 08/28/2018 01:25 PM   Modules accepted: Orders, SmartSet

## 2018-08-28 NOTE — Progress Notes (Signed)
08/28/2018  Reason for Visit:  Right inguinal hernia  Referring Provider:  Evelene CroonMeindert Niemeyer, MD  History of Present Illness: Rodney Watson is a 53 y.o. male presenting today for evaluation of a new right inguinal hernia.  The patient reports that he noticed this about 2 months ago, shortly after being in the ER and hospital for hypertension and seizure.  He reports that he gets a dull burning sensation, with sometimes sharp pain on the right groin.  The pain is particularly worse when he coughs or sneezes.  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  Denies any nausea, vomiting, abdominal pain.  Reports some constipation with bowel movement once every other day, but he's unsure if it's related to the hernia or not.  He saw his PCP and was told he had a hernia and referred him to us.  He is interested in having his hernia fixed so that he can get back to his normal work.  After his hospitalization, he denies having any further seizures and reports his blood pressure has improved.  His dilantin dose remained the same and his lisinopril was increased.  Past Medical History: Past Medical History:  Diagnosis Date  . Alcohol abuse   . Alcohol abuse 04/01/2017  . COPD (chronic obstructive pulmonary disease) (HCC)   . Crack cocaine use   . Depression   . ETOH abuse   . Seizures (HCC)      Past Surgical History: Past Surgical History:  Procedure Laterality Date  . BACK SURGERY     cyst removed.    Home Medications: Prior to Admission medications   Medication Sig Start Date End Date Taking? Authorizing Provider  albuterol (VENTOLIN HFA) 108 (90 Base) MCG/ACT inhaler Inhale 2 puffs into the lungs every 4 (four) hours as needed for wheezing or shortness of breath.   Yes [provider]  FLUoxetine (PROZAC) 20 MG capsule Take 1 capsule (20 mg total) by mouth daily. 10/29/17  Yes McNew, Ileene HutchinsonHolly R, MD   gabapentin (NEURONTIN) 300 MG capsule Take 1 capsule (300 mg total) by mouth 3 (three) times daily. 10/29/17  Yes McNew, Ileene HutchinsonHolly R, MD  levothyroxine (SYNTHROID, LEVOTHROID) 25 MCG tablet Take 1 tablet (25 mcg total) by mouth daily before breakfast. 10/29/17  Yes McNew, Ileene HutchinsonHolly R, MD  lisinopril (ZESTRIL) 20 MG tablet Take 1 tablet (20 mg total) by mouth daily. 07/02/18  Yes Mayo, Allyn KennerKaty Dodd, MD  meloxicam (MOBIC) 7.5 MG tablet Take 7.5 mg by mouth 2 (two) times daily.   Yes [provider]  QUEtiapine (SEROQUEL) 200 MG tablet Take 1 tablet (200 mg total) by mouth at bedtime. 10/29/17  Yes McNew, Ileene HutchinsonHolly R, MD  simvastatin (ZOCOR) 20 MG tablet Take 20 mg by mouth daily.   Yes [provider]  Tiotropium Bromide-Olodaterol (STIOLTO RESPIMAT) 2.5-2.5 MCG/ACT AERS Inhale 2 puffs into the lungs daily.   Yes [provider]  ondansetron (ZOFRAN ODT) 4 MG disintegrating tablet Take 1 tablet (4 mg total) by mouth every 8 (eight) hours as needed for nausea or vomiting. Patient not taking: Reported on 08/28/2018 07/02/18   Mayo, Allyn KennerKaty Dodd, MD  phenytoin (DILANTIN) 100 MG ER capsule Take 1 capsule (100 mg total) by mouth 3 (three) times daily. 01/23/18 06/30/18  Ihor AustinPyreddy, P[place], MD    Allergies: Allergies  Allergen Reactions  . Chocolate Hives  . Vicodin [Hydrocodone-Acetaminophen] Nausea And Vomiting  Social History:  reports that he has been smoking cigarettes. He has a 58.50 pack-year smoking history. His smokeless tobacco use includes chew. He reports current alcohol use. He reports current drug use. Drugs: Marijuana and Cocaine.   Family History: Family History  Problem Relation Age of Onset  . Alcohol abuse Father   . Seizures Neg Hx     Review of Systems: Review of Systems  Constitutional: Negative for chills and fever.  HENT: Negative for hearing loss.   Respiratory: Negative for shortness of breath.   Cardiovascular: Negative for chest pain.  Gastrointestinal: Positive  for abdominal pain (right groin pain) and constipation. Negative for diarrhea, nausea and vomiting.  Genitourinary: Negative for dysuria.  Musculoskeletal: Negative for myalgias.  Skin: Negative for rash.  Neurological: Negative for dizziness.  Psychiatric/Behavioral: Negative for depression.    Physical Exam BP (!) 147/105   Pulse 83   Temp 97.8 F (36.6 C) (Skin)   Ht 5\' 8"  (1.727 m)   Wt 119 lb (54 kg)   SpO2 98%   BMI 18.09 kg/m  CONSTITUTIONAL: No acute distress HEENT:  Normocephalic, atraumatic, extraocular motion intact. NECK: Trachea is midline, and there is no jugular venous distension.  RESPIRATORY:  Lungs are clear, and breath sounds are equal bilaterally. Normal respiratory effort without pathologic use of accessory muscles. CARDIOVASCULAR: Heart is regular without murmurs, gallops, or rubs. GI: The abdomen is soft, non-distended, currently non-tender to palpation.  Patient has a right inguinal hernia, reducible, with no significant tenderness while reducing.  The hernia extends only within the inguinal canal and does not extend to scrotum.  MUSCULOSKELETAL:  Normal muscle strength and tone in all four extremities.  No peripheral edema or cyanosis. SKIN: Skin turgor is normal. There are no pathologic skin lesions.  NEUROLOGIC:  Motor and sensation is grossly normal.  Cranial nerves are grossly intact. PSYCH:  Alert and oriented to person, place and time. Affect is normal.  Laboratory Analysis: No results found for this or any previous visit (from the past 24 hour(s)).  Imaging: No results found.  Assessment and Plan: This is a 53 y.o. male with new right inguinal hernia.  Discussed with the patient that we can help him with right inguinal hernia repair.  Discussed with him that we would use mesh to reinforce the repair.  Discussed with him risks of bleeding, infection, injury to surrounding structures.  He's willing to proceed.  We can schedule him for next week,  7/16.  He understands he needs to get COVID testing.  We will also get new labs and he'll need urine tox screen the day of surgery.  Face-to-face time spent with the patient and care providers was 60 minutes, with more than 50% of the time spent counseling, educating, and coordinating care of the patient.     Melvyn Neth, [facility]

## 2018-08-28 NOTE — Progress Notes (Signed)
Medical clearance request has been faxed to Dr. Gala Murdoch office and fax confirmation received (Phone: [phone]: [phone]).

## 2018-08-28 NOTE — Telephone Encounter (Signed)
Patient's surgery to be scheduled for 09-04-18 at [facility] with Dr. Hampton Abbot.   The patient is aware to have COVID-19 testing done on 09-01-18 at the [facility] drive thru ([address] [place]) between 8:00 am and 10:30 am. He is aware to isolate after, have no visitors, wash hands frequently, and avoid touching face.   The patient is aware he will be contacted by the [facility] to complete a phone interview sometime in the near future.  Patient aware to be NPO after midnight and have a driver.   He is aware to check in at the [facility] entrance where he will be screened for the coronavirus and then sent to Same Day Surgery.   Patient aware that he may have no visitors and driver will need to wait in the car due to COVID-19 restrictions.   The patient verbalizes understanding of the above.   The patient is aware to call the office should he have further questions.

## 2018-08-28 NOTE — Patient Instructions (Signed)
Inguinal Hernia, Adult °An inguinal hernia develops when fat or the intestines push through a weak spot in a muscle where your leg meets your lower abdomen (groin). This creates a bulge. This kind of hernia could also be: °· In your scrotum, if you are male. °· In folds of skin around your vagina, if you are male. °There are three types of inguinal hernias: °· Hernias that can be pushed back into the abdomen (are reducible). This type rarely causes pain. °· Hernias that are not reducible (are incarcerated). °· Hernias that are not reducible and lose their blood supply (are strangulated). This type of hernia requires emergency surgery. °What are the causes? °This condition is caused by having a weak spot in the muscles or tissues in the groin. This weak spot develops over time. The hernia may poke through the weak spot when you suddenly strain your lower abdominal muscles, such as when you: °· Lift a heavy object. °· Strain to have a bowel movement. Constipation can lead to straining. °· Cough. °What increases the risk? °This condition is more likely to develop in: °· Men. °· Pregnant women. °· People who: °? Are overweight. °? Work in jobs that require long periods of standing or heavy lifting. °? Have had an inguinal hernia before. °? Smoke or have lung disease. These factors can lead to long-lasting (chronic) coughing. °What are the signs or symptoms? °Symptoms may depend on the size of the hernia. Often, a small inguinal hernia has no symptoms. Symptoms of a larger hernia may include: °· A lump in the groin area. This is easier to see when standing. It might not be visible when lying down. °· Pain or burning in the groin. This may get worse when lifting, straining, or coughing. °· A dull ache or a feeling of pressure in the groin. °· In men, an unusual lump in the scrotum. °Symptoms of a strangulated inguinal hernia may include: °· A bulge in your groin that is very painful and tender to the touch. °· A bulge  that turns red or purple. °· Fever, nausea, and vomiting. °· Inability to have a bowel movement or to pass gas. °How is this diagnosed? °This condition is diagnosed based on your symptoms, your medical history, and a physical exam. Your health care provider may feel your groin area and ask you to cough. °How is this treated? °Treatment depends on the size of your hernia and whether you have symptoms. If you do not have symptoms, your health care provider may have you watch your hernia carefully and have you come in for follow-up visits. If your hernia is large or if you have symptoms, you may need surgery to repair the hernia. °Follow these instructions at home: °Lifestyle °· Avoid lifting heavy objects. °· Avoid standing for long periods of time. °· Do not use any products that contain nicotine or tobacco, such as cigarettes and e-cigarettes. If you need help quitting, ask your health care provider. °· Maintain a healthy weight. °Preventing constipation °· Take actions to prevent constipation. Constipation leads to straining with bowel movements, which can make a hernia worse or cause a hernia repair to break down. Your health care provider may recommend that you: °? Drink enough fluid to keep your urine pale yellow. °? Eat foods that are high in fiber, such as fresh fruits and vegetables, whole grains, and beans. °? Limit foods that are high in fat and processed sugars, such as fried or sweet foods. °? Take an over-the-counter   or prescription medicine for constipation. °General instructions °· You may try to push the hernia back in place by very gently pressing on it while lying down. Do not try to force the bulge back in if it will not push in easily. °· Watch your hernia for any changes in shape, size, or color. Get help right away if you notice any changes. °· Take over-the-counter and prescription medicines only as told by your health care provider. °· Keep all follow-up visits as told by your health care  provider. This is important. °Contact a health care provider if: °· You have a fever. °· You develop new symptoms. °· Your symptoms get worse. °Get help right away if: °· You have pain in your groin that suddenly gets worse. °· You have a bulge in your groin that: °? Suddenly gets bigger and does not get smaller. °? Becomes red or purple or painful to the touch. °· You are a man and you have a sudden pain in your scrotum, or the size of your scrotum suddenly changes. °· You cannot push the hernia back in place by very gently pressing on it when you are lying down. Do not try to force the bulge back in if it will not push in easily. °· You have nausea or vomiting that does not go away. °· You have a fast heartbeat. °· You cannot have a bowel movement or pass gas. °These symptoms may represent a serious problem that is an emergency. Do not wait to see if the symptoms will go away. Get medical help right away. Call your local emergency services (911 in the [place] °Summary °· An inguinal hernia develops when fat or the intestines push through a weak spot in a muscle where your leg meets your lower abdomen (groin). °· This condition is caused by having a weak spot in muscles or tissue in your groin. °· Symptoms may depend on the size of the hernia, and they may include pain or swelling in your groin. A small inguinal hernia often has no symptoms. °· Treatment may not be needed if you do not have symptoms. If you have symptoms or a large hernia, you may need surgery to repair the hernia. °· Avoid lifting heavy objects. Also avoid standing for long amounts of time. °This information is not intended to replace advice given to you by your health care provider. Make sure you discuss any questions you have with your health care provider. °Document Released: 06/24/2008 Document Revised: 03/09/2017 Document Reviewed: 11/07/2016 °Elsevier Patient Education © 2020 [employer] ° °

## 2018-08-29 ENCOUNTER — Inpatient Hospital Stay: Admission: RE | Admit: 2018-08-29 | Payer: Medicaid Other | Source: Ambulatory Visit

## 2018-09-01 ENCOUNTER — Other Ambulatory Visit
Admission: RE | Admit: 2018-09-01 | Discharge: 2018-09-01 | Disposition: A | Discharge status: Home / Self Care | Payer: Medicaid Other | Source: Ambulatory Visit | Attending: Surgery | Admitting: Surgery

## 2018-09-01 ENCOUNTER — Encounter
Admission: RE | Admit: 2018-09-01 | Discharge: 2018-09-01 | Disposition: A | Discharge status: Home / Self Care | Payer: Medicaid Other | Source: Ambulatory Visit | Attending: Surgery | Admitting: Surgery

## 2018-09-01 ENCOUNTER — Other Ambulatory Visit: Payer: Self-pay

## 2018-09-01 DIAGNOSIS — Z1159 Encounter for screening for other viral diseases: Secondary | ICD-10-CM | POA: Insufficient documentation

## 2018-09-01 HISTORY — DX: Hypothyroidism, unspecified: E03.9

## 2018-09-01 NOTE — Patient Instructions (Signed)
  Your procedure is scheduled on: Thursday September 04, 2018 Report to Same Day Surgery 2nd floor [facility] Entrance-take elevator on left to 2nd floor.  Check in with surgery information desk.) To find out your arrival time, call [phone] 1:00-3:00 PM on Wednesday September 03, 2018  Remember: Instructions that are not followed completely may result in serious medical risk, up to and including death, or upon the discretion of your surgeon and anesthesiologist your surgery may need to be rescheduled.    __x__ 1. Do not eat food (including mints, candies, chewing gum) after midnight the night before your procedure. You may drink clear liquids up to 2 hours before you are scheduled to arrive at the hospital for your procedure.  Do not drink anything within 2 hours of your scheduled arrival to the hospital.  Approved clear liquids:  --Water or Apple juice without pulp  --Clear carbohydrate beverage such as Gatorade or Powerade  --Black Coffee or Clear Tea (No milk, no creamers, do not add anything to the coffee or tea)    __x__ 2. No Alcohol for 24 hours before or after surgery.   __x__ 3. No Smoking or e-cigarettes for 24 hours before surgery.  Do not use any chewable tobacco products for at least 6 hours before surgery.   __x__ 4. Notify your doctor if there is any change in your medical condition (cold, fever, infections).   __x__ 5. On the morning of surgery brush your teeth with toothpaste and water.  You may rinse your mouth with mouthwash if you wish.  Do not swallow any toothpaste or mouthwash.   Do not wear jewelry, lotions, powders, deodorant or perfumes on the day of surgery.  Do not shave below the face/neck 48 hours prior to surgery.   Do not bring valuables to the hospital.    [facility] is not responsible for any belongings or valuables.               Contacts, dentures or bridgework may not be worn into surgery.  For patients admitted to the hospital, discharge  time is determined by your treatment team.  For patients discharged on the day of surgery, you will NOT be permitted to drive yourself home.  You must have a responsible adult with you for 24 hours after surgery.  __x__ Take these medicines on the morning of surgery with a SMALL SIP OF WATER:  1. Fluoxetine/Prozac  2. Gabapentin/Neurontin  3. Levothyroxine/Synthroid  4. Naltrexone/Depade  5. Phenytoin/Dilantin  6. Simvastatin/Zocor  Skip your Lisinopril/Zestril ONLY on the morning of surgery.  __x__ Use inhalers (Albuterol, Stiolto) on the day of surgery and bring them with you to the hospital.  __x__ Follow recommendations from Cardiologist, Pulmonologist or PCP regarding stopping any blood thinners such as Aspirin, Coumadin, Plavix, Eliquis, Effient, Pradaxa, and Pletal.  __x__ TODAY: Do not take any Anti-inflammatories such as Advil, Ibuprofen, Motrin, Aleve, Naproxen, Naprosyn, BC/Goodies powders or aspirin products.    __x__ TODAY: Do not take any supplements until after surgery. You may continue to take Vitamin D, Vitamin B, and multivitamin.  Reviewed over the telephone with patient 09/01/2018 @ 1015

## 2018-09-02 ENCOUNTER — Encounter: Payer: Self-pay | Admitting: *Deleted

## 2018-09-02 ENCOUNTER — Other Ambulatory Visit: Payer: Self-pay

## 2018-09-02 ENCOUNTER — Emergency Department
Admission: EM | Admit: 2018-09-02 | Discharge: 2018-09-03 | Disposition: A | Discharge status: Home / Self Care | Payer: Medicaid Other | Attending: Emergency Medicine | Admitting: Emergency Medicine

## 2018-09-02 ENCOUNTER — Emergency Department: Payer: Medicaid Other

## 2018-09-02 DIAGNOSIS — R569 Unspecified convulsions: Secondary | ICD-10-CM | POA: Insufficient documentation

## 2018-09-02 DIAGNOSIS — Z79899 Other long term (current) drug therapy: Secondary | ICD-10-CM | POA: Insufficient documentation

## 2018-09-02 DIAGNOSIS — E039 Hypothyroidism, unspecified: Secondary | ICD-10-CM | POA: Diagnosis not present

## 2018-09-02 DIAGNOSIS — F17220 Nicotine dependence, chewing tobacco, uncomplicated: Secondary | ICD-10-CM | POA: Insufficient documentation

## 2018-09-02 DIAGNOSIS — F17210 Nicotine dependence, cigarettes, uncomplicated: Secondary | ICD-10-CM | POA: Diagnosis not present

## 2018-09-02 DIAGNOSIS — J449 Chronic obstructive pulmonary disease, unspecified: Secondary | ICD-10-CM | POA: Diagnosis not present

## 2018-09-02 DIAGNOSIS — I10 Essential (primary) hypertension: Secondary | ICD-10-CM | POA: Diagnosis not present

## 2018-09-02 LAB — CBC
HCT: 42.5 % (ref 39.0–52.0)
Hemoglobin: 14 g/dL (ref 13.0–17.0)
MCH: 31 pg (ref 26.0–34.0)
MCHC: 32.9 g/dL (ref 30.0–36.0)
MCV: 94.2 fL (ref 80.0–100.0)
Platelets: 156 10*3/uL (ref 150–400)
RBC: 4.51 MIL/uL (ref 4.22–5.81)
RDW: 15.1 % (ref 11.5–15.5)
WBC: 4.9 10*3/uL (ref 4.0–10.5)
nRBC: 0 % (ref 0.0–0.2)

## 2018-09-02 LAB — HEPATIC FUNCTION PANEL
ALT: 13 U/L (ref 0–44)
AST: 15 U/L (ref 15–41)
Albumin: 4.1 g/dL (ref 3.5–5.0)
Alkaline Phosphatase: 54 U/L (ref 38–126)
Bilirubin, Direct: <0.1 mg/dL (ref 0.0–0.2)
Total Bilirubin: 0.5 mg/dL (ref 0.3–1.2)
Total Protein: 6.6 g/dL (ref 6.5–8.1)

## 2018-09-02 LAB — SALICYLATE LEVEL: Salicylate Lvl: <7 mg/dL (ref 2.8–30.0)

## 2018-09-02 LAB — BASIC METABOLIC PANEL
Anion gap: 12 (ref 5–15)
BUN: 11 mg/dL (ref 6–20)
CO2: 23 mmol/L (ref 22–32)
Calcium: 9 mg/dL (ref 8.9–10.3)
Chloride: 104 mmol/L (ref 98–111)
Creatinine, Ser: 0.65 mg/dL (ref 0.61–1.24)
GFR calc Af Amer: >60 mL/min (ref >60)
GFR calc non Af Amer: >60 mL/min (ref >60)
Glucose, Bld: 102 mg/dL — ABNORMAL HIGH (ref 70–99)
Potassium: 3.6 mmol/L (ref 3.5–5.1)
Sodium: 139 mmol/L (ref 135–145)

## 2018-09-02 LAB — PHENYTOIN LEVEL, TOTAL: Phenytoin Lvl: 8.9 ug/mL — ABNORMAL LOW (ref 10.0–20.0)

## 2018-09-02 LAB — ACETAMINOPHEN LEVEL: Acetaminophen (Tylenol), Serum: <10 ug/mL — ABNORMAL LOW (ref 10–30)

## 2018-09-02 LAB — ETHANOL: Alcohol, Ethyl (B): <10 mg/dL (ref <10)

## 2018-09-02 LAB — SARS CORONAVIRUS 2 (TAT 6-24 HRS): SARS Coronavirus 2: NEGATIVE

## 2018-09-02 MED ORDER — THIAMINE HCL 100 MG/ML IJ SOLN
Freq: Once | INTRAVENOUS | Status: AC
  Administered 2018-09-02: 21:00:00 via INTRAVENOUS
  Filled 2018-09-02: qty 1000

## 2018-09-02 MED ORDER — SODIUM CHLORIDE 0.9 % IV SOLN
5.0000 mg/kg | Freq: Once | INTRAVENOUS | Status: AC
  Administered 2018-09-02: 19:00:00 270 mg via INTRAVENOUS
  Filled 2018-09-02: qty 5.4

## 2018-09-02 NOTE — ED Notes (Signed)
Sitter with pt.  Pt moving about on bed.  Confused.  siderails up x 2.   nsr on monitor.

## 2018-09-02 NOTE — ED Notes (Signed)
[facility].  Hx seizures.  Pt drinks etoh.  Pt sleeping and confused on arrival to er.  Pt had recent visit to [place] to visit family for past 3 weeks per ems.  siderails and sz pads in place.

## 2018-09-02 NOTE — Progress Notes (Signed)
Medical clearance was received from Dr. Gala Murdoch office-patient cleared at medium risk.   Copy of clearance was faxed to the Pre-admission Testing Department and fax confirmation received.

## 2018-09-02 NOTE — ED Notes (Signed)
ED Provider at bedside. 

## 2018-09-02 NOTE — ED Notes (Signed)
Pt urinated on self, clothing removed and placed in bag, brief put on pt. Pt is still very incoherent and taking blood pressure cuff and ekg leads off.

## 2018-09-02 NOTE — Discharge Instructions (Signed)
Please be sure to take your Dilantin.  Please follow-up with your regular doctors.  Please return here for any further problems.  Try not to drink.  You can follow-up with [facility] or our TS.  Also you can just do Alcoholics Anonymous.  That is probably the best thing we have.

## 2018-09-02 NOTE — ED Notes (Signed)
Pt still confused, this nurse spoke with angie king (pt's friend) and she refused to come get him because of his confusion.  Dr Archie Balboa aware

## 2018-09-02 NOTE — ED Notes (Signed)
Safety sitter with pt 

## 2018-09-02 NOTE — ED Triage Notes (Addendum)
Pt brought in via ems from [facility]. Pt had a seizure.  postical on scene per ems   Iv in place.  Pt confused and sleeping.  nsr on monitor.  fsbs 101 per ems

## 2018-09-02 NOTE — ED Provider Notes (Signed)
[facility] Emergency Department Provider Note   ____________________________________________   First MD Initiated Contact with Patient 09/02/18 1740     (approximate)  I have reviewed the triage vital signs and the nursing notes.   HISTORY  Chief Complaint seizure History limited by altered mental status HPI Rodney Watson is a 53 y.o. male who is a known alcoholic with seizure disorder.  He reportedly had a seizure today.  He is most likely postictal as his ethanol level is less than 10.   He is confused but cooperative.      Past Medical History:  Diagnosis Date  . Alcohol abuse   . Alcohol abuse 04/01/2017  . COPD (chronic obstructive pulmonary disease) ([place])   . Crack cocaine use   . Depression   . ETOH abuse   . Hypothyroidism   . Seizures [facility])     Patient Active Problem List   Diagnosis Date Noted  . Seizures ([place]) 06/30/2018  . Severe recurrent major depression without psychotic features ([place]) 10/25/2017  . Suicide attempt ([place]) 10/24/2017  . Major depressive disorder, recurrent episode, severe ([place]) 09/29/2017  . Alcohol withdrawal ([place]) 07/18/2017  . Seizure disorder ([place]) 07/18/2017  . Essential hypertension 07/18/2017  . Alcohol abuse with alcohol-induced mood disorder ([place]) 04/10/2017    Past Surgical History:  Procedure Laterality Date  . BACK SURGERY     cyst removed.    Prior to Admission medications   Medication Sig Start Date End Date Taking? Authorizing Provider  albuterol (VENTOLIN HFA) 108 (90 Base) MCG/ACT inhaler Inhale 2 puffs into the lungs every 4 (four) hours as needed for wheezing or shortness of breath.    [provider]  FLUoxetine (PROZAC) 20 MG capsule Take 1 capsule (20 mg total) by mouth daily. Patient not taking: Reported on 08/28/2018 10/29/17   Marylin Crosby, MD  FLUoxetine (PROZAC) 40 MG capsule Take 40 mg by mouth daily.    [provider]  gabapentin (NEURONTIN) 300 MG  capsule Take 1 capsule (300 mg total) by mouth 3 (three) times daily. 10/29/17   McNew, Tyson Babinski, MD  levothyroxine (SYNTHROID, LEVOTHROID) 25 MCG tablet Take 1 tablet (25 mcg total) by mouth daily before breakfast. 10/29/17   McNew, Tyson Babinski, MD  lisinopril (ZESTRIL) 10 MG tablet Take 10 mg by mouth daily.    [provider]  lisinopril (ZESTRIL) 20 MG tablet Take 1 tablet (20 mg total) by mouth daily. Patient not taking: Reported on 08/28/2018 07/02/18   MayoPete Pelt, MD  naltrexone (DEPADE) 50 MG tablet Take 50 mg by mouth daily.    [provider]  ondansetron (ZOFRAN ODT) 4 MG disintegrating tablet Take 1 tablet (4 mg total) by mouth every 8 (eight) hours as needed for nausea or vomiting. 07/02/18   Mayo, Pete Pelt, MD  phenytoin (DILANTIN) 100 MG ER capsule Take 1 capsule (100 mg total) by mouth 3 (three) times daily. 01/23/18 09/01/18  Saundra Shelling, MD  QUEtiapine (SEROQUEL) 200 MG tablet Take 1 tablet (200 mg total) by mouth at bedtime. 10/29/17   McNew, Tyson Babinski, MD  simvastatin (ZOCOR) 20 MG tablet Take 20 mg by mouth daily.    [provider]  Tiotropium Bromide-Olodaterol (STIOLTO RESPIMAT) 2.5-2.5 MCG/ACT AERS Inhale 2 puffs into the lungs daily.    [provider]    Allergies Chocolate and Vicodin [hydrocodone-acetaminophen]  Family History  Problem Relation Age of Onset  . Alcohol abuse Father   . Seizures  Neg Hx     Social History Social History   Tobacco Use  . Smoking status: Current Every Day Smoker    Packs/day: 1.00    Years: 39.00    Pack years: 39.00    Types: Cigarettes  . Smokeless tobacco: Current User    Types: Chew  Substance Use Topics  . Alcohol use: Not Currently    Comment: 10/30/2017  . Drug use: Yes    Types: Marijuana, Cocaine    Comment: 1-2 joints/week    Review of Systems Unable to obtain  ____________________________________________   PHYSICAL EXAM:  VITAL SIGNS: ED Triage Vitals  Enc Vitals Group      BP 09/02/18 1724 (!) 192/110     Pulse Rate 09/02/18 1715 100     Resp 09/02/18 1723 (!) 22     Temp 09/02/18 1723 97.8 F (36.6 C)     Temp Source 09/02/18 1723 Oral     SpO2 09/02/18 1715 100 %     Weight --      Height --      Head Circumference --      Peak Flow --      Pain Score 09/02/18 1724 0     Pain Loc --      Pain Edu? --      Excl. in [place]? --     Constitutional: Sleeping but arousable.  Confused but cooperative.  Will not follow all commands.. Eyes: Conjunctivae are normal.  Head: Atraumatic. Nose: No congestion/rhinnorhea. Mouth/Throat: Mucous membranes are moist.  Oropharynx non-erythematous. Neck: No stridor. Cardiovascular: Normal rate, regular rhythm. Grossly normal heart sounds.  Good peripheral circulation. Respiratory: Normal respiratory effort.  No retractions. Lungs CTAB. Gastrointestinal: Soft and nontender. No distention. No abdominal bruits. No CVA tenderness. Musculoskeletal: No lower extremity tenderness nor edema. Neurologic:  Normal speech and language. No gross focal neurologic deficits are appreciated. No gait instability. Skin:  Skin is warm, dry and intact. No rash noted.   ____________________________________________   LABS (all labs ordered are listed, but only abnormal results are displayed)  Labs Reviewed  BASIC METABOLIC PANEL - Abnormal; Notable for the following components:      Result Value   Glucose, Bld 102 (*)    All other components within normal limits  PHENYTOIN LEVEL, TOTAL - Abnormal; Notable for the following components:   Phenytoin Lvl 8.9 (*)    All other components within normal limits  CBC  ETHANOL  HEPATIC FUNCTION PANEL  URINALYSIS, COMPLETE (UACMP) WITH MICROSCOPIC  URINE DRUG SCREEN, QUALITATIVE ([facility] ONLY)  ACETAMINOPHEN LEVEL  SALICYLATE LEVEL  HEPATIC FUNCTION PANEL  CBG MONITORING, ED   ____________________________________________  EKG   ____________________________________________   RADIOLOGY  ED MD interpretation: CT read by radiology reviewed by me shows no acute disease same with a CT C-spine  Official radiology report(s): Ct Head Wo Contrast  Result Date: 09/02/2018 CLINICAL DATA:  Seizure. EXAM: CT HEAD WITHOUT CONTRAST CT CERVICAL SPINE WITHOUT CONTRAST TECHNIQUE: Multidetector CT imaging of the head and cervical spine was performed following the standard protocol without intravenous contrast. Multiplanar CT image reconstructions of the cervical spine were also generated. COMPARISON:  Head CT 01/21/2018 FINDINGS: CT HEAD FINDINGS Brain: No acute intracranial hemorrhage. No focal mass lesion. No CT evidence of acute infarction. No midline shift or mass effect. No hydrocephalus. Basilar cisterns are patent. Vascular: No hyperdense vessel or unexpected calcification. Skull: Normal. Negative for fracture or focal lesion. Sinuses/Orbits: Paranasal sinuses and mastoid air cells are clear. Orbits  are clear. Other: None. CT CERVICAL SPINE FINDINGS Alignment: Normal alignment of the cervical vertebral bodies. Skull base and vertebrae: Normal craniocervical junction. No loss of vertebral body height or disc height. Normal facet articulation. No evidence of fracture. Soft tissues and spinal canal: No prevertebral soft tissue swelling. No perispinal or epidural hematoma. Disc levels:  Endplate osteophytosis most severe at C6-C7. Upper chest: Clear Other: None IMPRESSION: 1. No acute intracranial findings. 2. No cervical spine fracture. Electronically Signed   By: Genevive BiStewart  Edmunds M.D.   On: 09/02/2018 18:52   Ct Cervical Spine Wo Contrast  Result Date: 09/02/2018 CLINICAL DATA:  Seizure. EXAM: CT HEAD WITHOUT CONTRAST CT CERVICAL SPINE WITHOUT CONTRAST TECHNIQUE: Multidetector CT imaging of the head and cervical spine was performed following the standard protocol without intravenous contrast. Multiplanar CT image reconstructions of the cervical spine were also generated. COMPARISON:  Head  CT 01/21/2018 FINDINGS: CT HEAD FINDINGS Brain: No acute intracranial hemorrhage. No focal mass lesion. No CT evidence of acute infarction. No midline shift or mass effect. No hydrocephalus. Basilar cisterns are patent. Vascular: No hyperdense vessel or unexpected calcification. Skull: Normal. Negative for fracture or focal lesion. Sinuses/Orbits: Paranasal sinuses and mastoid air cells are clear. Orbits are clear. Other: None. CT CERVICAL SPINE FINDINGS Alignment: Normal alignment of the cervical vertebral bodies. Skull base and vertebrae: Normal craniocervical junction. No loss of vertebral body height or disc height. Normal facet articulation. No evidence of fracture. Soft tissues and spinal canal: No prevertebral soft tissue swelling. No perispinal or epidural hematoma. Disc levels:  Endplate osteophytosis most severe at C6-C7. Upper chest: Clear Other: None IMPRESSION: 1. No acute intracranial findings. 2. No cervical spine fracture. Electronically Signed   By: Genevive BiStewart  Edmunds M.D.   On: 09/02/2018 18:52    ____________________________________________   PROCEDURES  Procedure(s) performed (including Critical Care):  Procedures   ____________________________________________   INITIAL IMPRESSION / ASSESSMENT AND PLAN / ED COURSE  Rodney Watson was evaluated in Emergency Department on 09/02/2018 for the symptoms described in the history of present illness. He was evaluated in the context of the global COVID-19 pandemic, which necessitated consideration that the patient might be at risk for infection with the SARS-CoV-2 virus that causes COVID-19. Institutional protocols and algorithms that pertain to the evaluation of patients at risk for COVID-19 are in a state of rapid change based on information released by regulatory bodies including the CDC and federal and state organizations. These policies and algorithms were followed during the patient's care in the ED.     ----------------------------------------- 8:22 PM on 09/02/2018 -----------------------------------------  Patient is beginning to wake up nicely.  He tells me he was here last time and he wanted to put him in a nap but he did not want to go so he did not.  He is moving all extremities equally and well and making more sense.  As he continues to improve I plan on discharging him home.   Pt signed out to Dr Derrill KayGoodman.      ____________________________________________   FINAL CLINICAL IMPRESSION(S) / ED DIAGNOSES  Final diagnoses:  Seizure [facility](HCC)     ED Discharge Orders    None       Note:  This document was prepared using Dragon voice recognition software and may include unintentional dictation errors.    Arnaldo NatalMalinda, Paul F, MD 09/02/18 2104

## 2018-09-02 NOTE — ED Notes (Signed)
Sitter with pt  Pt still confused. nsr on monitor.

## 2018-09-02 NOTE — ED Notes (Signed)
Pt pulled out lfa iv.  Iv fluids infusing rfa.  Sitter with pt.

## 2018-09-03 NOTE — ED Notes (Signed)
Pt sleeping  telesitter with pt 

## 2018-09-03 NOTE — ED Notes (Signed)
Called [place] to arrange transportation, no answer.

## 2018-09-03 NOTE — OR Nursing (Signed)
Dr. Hampton Abbot states to cancel surgery d/t recent seizure activity, wants clearance from Neuro Surgery. Office to call PT in the A.M. Notified Dr. Kayleen Memos, SDS and OR staff.

## 2018-09-03 NOTE — ED Notes (Signed)
Pt sleeping  telesitter with pt

## 2018-09-03 NOTE — ED Notes (Signed)
Report off to katy rn  

## 2018-09-03 NOTE — ED Notes (Signed)
Received return call from [facility], states she will come get patient but is unsure when because it is early in the morning.

## 2018-09-04 ENCOUNTER — Telehealth: Payer: Self-pay | Admitting: *Deleted

## 2018-09-04 ENCOUNTER — Ambulatory Visit: Admission: RE | Admit: 2018-09-04 | Payer: Medicaid Other | Source: Home / Self Care | Admitting: Surgery

## 2018-09-04 ENCOUNTER — Encounter: Admission: RE | Payer: Self-pay | Source: Home / Self Care

## 2018-09-04 SURGERY — REPAIR, HERNIA, INGUINAL, ADULT
Anesthesia: General | Laterality: Right

## 2018-09-04 NOTE — Telephone Encounter (Signed)
Detailed message left for patient to call the office.   Surgery has been cancelled for today per Dr. Hampton Abbot.  The patient reported to the ED with a seizure yesterday. Even though patient had medical clearance, Dr. Hampton Abbot would like for patient to have neurology clearance or clearance from the doctor that prescribes his seizure medication before proceeding with anesthesia/surgery.

## 2018-09-04 NOTE — Telephone Encounter (Signed)
I was able to call back and speak with the patient.   Patient states Dr. Brunetta Genera prescribes his seizure medication. Since we previously received medical clearance from Dr. Brunetta Genera, the patient will require neurology clearance prior to surgery.  I did call to [facility] (Phone: [phone]) to see about getting patient an appointment. Due to patient's insurance, he will have to be referred by his PCP.   I did call Dr. Gala Murdoch office and they need to see patient first in order to make a referral. Patient to see Dr. Brunetta Genera on Monday, 09-08-18 at 2:30 pm. Patient verbalizes understanding.   Note routed to Dr. Hampton Abbot.

## 2018-09-09 ENCOUNTER — Telehealth: Payer: Self-pay | Admitting: *Deleted

## 2018-09-09 NOTE — Telephone Encounter (Signed)
I did call and speak with Dr. Gala Murdoch office today. [place] desk staff reports that patient was a no show to appointment that was scheduled for yesterday.  The patient reports that he was sent a letter with an appointment to see [facility] on 09-12-18 at 9 am and that appointment with Dr. Brunetta Genera was not needed.  I did fax over neurology clearance request and fax confirmation was received.

## 2018-09-16 ENCOUNTER — Encounter: Payer: Self-pay | Admitting: *Deleted

## 2018-09-16 NOTE — Progress Notes (Signed)
I did call and speak with [name] at [facility] today to follow up on this patient to see if he was seen last week.   Per office, his appointment is not scheduled until 10-13-18 at 9 am.

## 2018-10-13 ENCOUNTER — Emergency Department
Admission: EM | Admit: 2018-10-13 | Discharge: 2018-10-14 | Disposition: A | Discharge status: Home / Self Care | Payer: Medicaid Other | Attending: Emergency Medicine | Admitting: Emergency Medicine

## 2018-10-13 ENCOUNTER — Encounter: Payer: Self-pay | Admitting: *Deleted

## 2018-10-13 ENCOUNTER — Other Ambulatory Visit: Payer: Self-pay

## 2018-10-13 DIAGNOSIS — F332 Major depressive disorder, recurrent severe without psychotic features: Secondary | ICD-10-CM | POA: Diagnosis not present

## 2018-10-13 DIAGNOSIS — T50904A Poisoning by unspecified drugs, medicaments and biological substances, undetermined, initial encounter: Secondary | ICD-10-CM

## 2018-10-13 DIAGNOSIS — Z79899 Other long term (current) drug therapy: Secondary | ICD-10-CM | POA: Diagnosis not present

## 2018-10-13 DIAGNOSIS — E039 Hypothyroidism, unspecified: Secondary | ICD-10-CM | POA: Insufficient documentation

## 2018-10-13 DIAGNOSIS — F329 Major depressive disorder, single episode, unspecified: Secondary | ICD-10-CM | POA: Diagnosis not present

## 2018-10-13 DIAGNOSIS — T450X4A Poisoning by antiallergic and antiemetic drugs, undetermined, initial encounter: Secondary | ICD-10-CM | POA: Diagnosis not present

## 2018-10-13 DIAGNOSIS — F17210 Nicotine dependence, cigarettes, uncomplicated: Secondary | ICD-10-CM | POA: Insufficient documentation

## 2018-10-13 DIAGNOSIS — J449 Chronic obstructive pulmonary disease, unspecified: Secondary | ICD-10-CM | POA: Diagnosis not present

## 2018-10-13 DIAGNOSIS — F10239 Alcohol dependence with withdrawal, unspecified: Secondary | ICD-10-CM | POA: Diagnosis present

## 2018-10-13 DIAGNOSIS — F1014 Alcohol abuse with alcohol-induced mood disorder: Secondary | ICD-10-CM | POA: Diagnosis present

## 2018-10-13 DIAGNOSIS — F32A Depression, unspecified: Secondary | ICD-10-CM

## 2018-10-13 DIAGNOSIS — F10939 Alcohol use, unspecified with withdrawal, unspecified: Secondary | ICD-10-CM | POA: Diagnosis present

## 2018-10-13 DIAGNOSIS — I10 Essential (primary) hypertension: Secondary | ICD-10-CM | POA: Diagnosis present

## 2018-10-13 DIAGNOSIS — R4182 Altered mental status, unspecified: Secondary | ICD-10-CM | POA: Diagnosis present

## 2018-10-13 LAB — CBC
HCT: 49.4 % (ref 39.0–52.0)
Hemoglobin: 16.4 g/dL (ref 13.0–17.0)
MCH: 30.4 pg (ref 26.0–34.0)
MCHC: 33.2 g/dL (ref 30.0–36.0)
MCV: 91.5 fL (ref 80.0–100.0)
Platelets: 227 10*3/uL (ref 150–400)
RBC: 5.4 MIL/uL (ref 4.22–5.81)
RDW: 13.8 % (ref 11.5–15.5)
WBC: 7.6 10*3/uL (ref 4.0–10.5)
nRBC: 0 % (ref 0.0–0.2)

## 2018-10-13 LAB — URINE DRUG SCREEN, QUALITATIVE (ARMC ONLY)
Amphetamines, Ur Screen: NOT DETECTED
Barbiturates, Ur Screen: NOT DETECTED
Benzodiazepine, Ur Scrn: NOT DETECTED
Cannabinoid 50 Ng, Ur ~~LOC~~: POSITIVE — AB
Cocaine Metabolite,Ur ~~LOC~~: NOT DETECTED
MDMA (Ecstasy)Ur Screen: NOT DETECTED
Methadone Scn, Ur: NOT DETECTED
Opiate, Ur Screen: NOT DETECTED
Phencyclidine (PCP) Ur S: NOT DETECTED
Tricyclic, Ur Screen: POSITIVE — AB

## 2018-10-13 LAB — COMPREHENSIVE METABOLIC PANEL
ALT: 16 U/L (ref 0–44)
AST: 19 U/L (ref 15–41)
Albumin: 4.4 g/dL (ref 3.5–5.0)
Alkaline Phosphatase: 65 U/L (ref 38–126)
Anion gap: 9 (ref 5–15)
BUN: 12 mg/dL (ref 6–20)
CO2: 27 mmol/L (ref 22–32)
Calcium: 9 mg/dL (ref 8.9–10.3)
Chloride: 104 mmol/L (ref 98–111)
Creatinine, Ser: 0.77 mg/dL (ref 0.61–1.24)
GFR calc Af Amer: >60 mL/min (ref >60)
GFR calc non Af Amer: >60 mL/min (ref >60)
Glucose, Bld: 71 mg/dL (ref 70–99)
Potassium: 3.5 mmol/L (ref 3.5–5.1)
Sodium: 140 mmol/L (ref 135–145)
Total Bilirubin: 0.4 mg/dL (ref 0.3–1.2)
Total Protein: 6.9 g/dL (ref 6.5–8.1)

## 2018-10-13 LAB — SALICYLATE LEVEL: Salicylate Lvl: <7 mg/dL (ref 2.8–30.0)

## 2018-10-13 LAB — ETHANOL: Alcohol, Ethyl (B): <10 mg/dL (ref <10)

## 2018-10-13 LAB — ACETAMINOPHEN LEVEL: Acetaminophen (Tylenol), Serum: <10 ug/mL — ABNORMAL LOW (ref 10–30)

## 2018-10-13 MED ORDER — SODIUM CHLORIDE 0.9 % IV BOLUS
1000.0000 mL | Freq: Once | INTRAVENOUS | Status: AC
  Administered 2018-10-13: 1000 mL via INTRAVENOUS

## 2018-10-13 NOTE — ED Notes (Signed)
Report to include Situation, Background, Assessment, and Recommendations received from Kim RN. Patient alert and oriented, warm and dry, in no acute distress. Patient denies SI, HI, AVH and pain. Patient made aware of Q15 minute rounds and security cameras for their safety. Patient instructed to come to me with needs or concerns.  

## 2018-10-13 NOTE — ED Notes (Signed)
Pt took hat off.  1 gray and white hat and  Black eyeglass case placed in belonging bag.

## 2018-10-13 NOTE — ED Notes (Signed)
Pt ambulates without difficulty

## 2018-10-13 NOTE — ED Triage Notes (Signed)
Pt brought in via  [facility] from [facility].  Pt took approx 39 25mg  benadryl from 11a-4pm today   Pt alert and talking.  Pt drowsy.

## 2018-10-13 NOTE — ED Notes (Signed)
Iv attempts without success.  Pt tolerating well.

## 2018-10-13 NOTE — ED Notes (Signed)
Report called to kim rn in bhu 

## 2018-10-13 NOTE — ED Provider Notes (Signed)
[facility] Emergency Department Provider Note ____________________________________________   First MD Initiated Contact with Patient 10/13/18 1757     (approximate)  I have reviewed the triage vital signs and the nursing notes.   HISTORY  Chief Complaint Drug Overdose  Level 5 caveat: History of present illness limited due to intoxication/altered mental status  HPI Rodney Watson is a 53 y.o. male with PMH as noted below including polysubstance abuse who presents after a Benadryl overdose.  The patient states he took 36 or 39 25 mg Benadryl tablets between around 11 AM and 4 PM.  He states that he did this recreationally to get high and he has taken Benadryl before in this way with coffee to try to "get a buzz."  He denies SI or HI.  He endorses marijuana use but denies any other drugs or alcohol.  Past Medical History:  Diagnosis Date  . Alcohol abuse   . Alcohol abuse 04/01/2017  . COPD (chronic obstructive pulmonary disease) (HCC)   . Crack cocaine use   . Depression   . ETOH abuse   . Hypothyroidism   . Seizures [name](HCC)     Patient Active Problem List   Diagnosis Date Noted  . Seizures (HCC) 06/30/2018  . Severe recurrent major depression without psychotic features (HCC) 10/25/2017  . Suicide attempt (HCC) 10/24/2017  . Major depressive disorder, recurrent episode, severe (HCC) 09/29/2017  . Alcohol withdrawal (HCC) 07/18/2017  . Seizure disorder (HCC) 07/18/2017  . Essential hypertension 07/18/2017  . Alcohol abuse with alcohol-induced mood disorder (HCC) 04/10/2017    Past Surgical History:  Procedure Laterality Date  . BACK SURGERY     cyst removed.    Prior to Admission medications   Medication Sig Start Date End Date Taking? Authorizing Provider  albuterol (VENTOLIN HFA) 108 (90 Base) MCG/ACT inhaler Inhale 2 puffs into the lungs every 4 (four) hours as needed for wheezing or shortness of breath.   Yes [provider]   gabapentin (NEURONTIN) 300 MG capsule Take 1 capsule (300 mg total) by mouth 3 (three) times daily. 10/29/17  Yes McNew, Ileene HutchinsonHolly R, MD  levothyroxine (SYNTHROID, LEVOTHROID) 25 MCG tablet Take 1 tablet (25 mcg total) by mouth daily before breakfast. 10/29/17  Yes McNew, Ileene HutchinsonHolly R, MD  lisinopril (ZESTRIL) 20 MG tablet Take 1 tablet (20 mg total) by mouth daily. 07/02/18  Yes Mayo, Allyn KennerKaty Dodd, MD  naltrexone (DEPADE) 50 MG tablet Take 50 mg by mouth daily.   Yes [provider]  phenytoin (DILANTIN) 100 MG ER capsule Take 1 capsule (100 mg total) by mouth 3 (three) times daily. Patient taking differently: Take 100 mg by mouth 4 (four) times daily.  01/23/18 10/13/18 Yes Pyreddy, Vivien RotaPavan, MD  simvastatin (ZOCOR) 20 MG tablet Take 20 mg by mouth daily.   Yes [provider]  Tiotropium Bromide-Olodaterol (STIOLTO RESPIMAT) 2.5-2.5 MCG/ACT AERS Inhale 2 puffs into the lungs daily.   Yes [provider]    Allergies Chocolate and Vicodin [hydrocodone-acetaminophen]  Family History  Problem Relation Age of Onset  . Alcohol abuse Father   . Seizures Neg Hx     Social History Social History   Tobacco Use  . Smoking status: Current Every Day Smoker    Packs/day: 1.00    Years: 39.00    Pack years: 39.00    Types: Cigarettes  . Smokeless tobacco: Current User    Types: Chew  Substance Use Topics  . Alcohol use: Not Currently  Comment: 10/30/2017  . Drug use: Yes    Types: Marijuana, Cocaine    Comment: 1-2 joints/week    Review of Systems Level 5 caveat: Unable to obtain review of systems due to intoxication/altered mental status   ____________________________________________   PHYSICAL EXAM:  VITAL SIGNS: ED Triage Vitals  Enc Vitals Group     BP 10/13/18 1749 (!) 175/87     Pulse Rate 10/13/18 1745 79     Resp 10/13/18 1745 (!) 22     Temp 10/13/18 1749 98.8 F (37.1 C)     Temp Source 10/13/18 1749 Oral     SpO2 10/13/18 1745 98 %     Weight --       Height --      Head Circumference --      Peak Flow --      Pain Score 10/13/18 1750 0     Pain Loc --      Pain Edu? --      Excl. in [place]? --     Constitutional: Alert, oriented x4 but somewhat drowsy appearing.  Answering questions but poor attention.  Eyes: Conjunctivae are normal.  EOMI.  PERRLA. Head: Atraumatic. Nose: No congestion/rhinnorhea. Mouth/Throat: Mucous membranes are dry.   Neck: Normal range of motion.  Cardiovascular: Normal rate, regular rhythm. Grossly normal heart sounds.  Good peripheral circulation. Respiratory: Normal respiratory effort.  No retractions. Lungs CTAB. Gastrointestinal: No distention.  Musculoskeletal: No lower extremity edema.  Extremities warm and well perfused.  Neurologic: Mildly slurred speech.  Motor intact in all extremities. Skin:  Skin is warm and dry. No rash noted. Psychiatric: Calm and cooperative.  ____________________________________________   LABS (all labs ordered are listed, but only abnormal results are displayed)  Labs Reviewed  URINE DRUG SCREEN, QUALITATIVE ([place]) - Abnormal; Notable for the following components:      Result Value   Tricyclic, Ur Screen POSITIVE (*)    Cannabinoid 50 Ng, Ur [place] POSITIVE (*)    All other components within normal limits  ACETAMINOPHEN LEVEL - Abnormal; Notable for the following components:   Acetaminophen (Tylenol), Serum <10 (*)    All other components within normal limits  CBC  SALICYLATE LEVEL  COMPREHENSIVE METABOLIC PANEL  ETHANOL   ____________________________________________  EKG  ED ECG REPORT I, Arta Silence, the attending physician, personally viewed and interpreted this ECG.  Date: 10/13/2018 EKG Time: 1813 Rate: 76 Rhythm: normal sinus rhythm QRS Axis: normal Intervals: normal ST/T Wave abnormalities: normal Narrative Interpretation: no evidence of acute ischemia  ____________________________________________  RADIOLOGY     ____________________________________________   PROCEDURES  Procedure(s) performed: No  Procedures  Critical Care performed: Yes  CRITICAL CARE Performed by: Arta Silence   Total critical care time: 30 minutes  Critical care time was exclusive of separately billable procedures and treating other patients.  Critical care was necessary to treat or prevent imminent or life-threatening deterioration.  Critical care was time spent personally by me on the following activities: development of treatment plan with patient and/or surrogate as well as nursing, discussions with consultants, evaluation of patient's response to treatment, examination of patient, obtaining history from patient or surrogate, ordering and performing treatments and interventions, ordering and review of laboratory studies, ordering and review of radiographic studies, pulse oximetry and re-evaluation of patient's condition. ____________________________________________   INITIAL IMPRESSION / ASSESSMENT AND PLAN / ED COURSE  Pertinent labs & imaging results that were available during my care of the patient were reviewed by me and  considered in my medical decision making (see chart for details).  53 year old male with PMH as noted above presents after an intentional Benadryl overdose although the patient states he was not trying to harm himself, but was taking the Benadryl recreationally.  He denies other drug use.  I reviewed the past medical records in Epic.  The patient was most recently seen last month with a seizure and possible alcohol withdrawal.  He was admitted in May with hypertensive encephalopathy and seizures.  On exam the patient is awake but slightly drowsy.  His vital signs are normal except for hypertension.  Neurologic exam is nonfocal.  The patient is able to answer my questions although he is inattentive and often stares off towards the clock or while before answering me.  There is no evidence of  trauma.  EKG shows no abnormal intervals or other acute findings.  I discussed the case with poison control who advised that given his relatively well appearance and reassuring vital signs, that he just needs to be observed for a few hours from a toxicologic perspective, and to obtain labs for coingestants.  The patient states that he was not trying to hurt himself and reports taking the Benadryl recreationally, however given the large amount that he took and his apparent lack of insight, I am concerned that the patient is an acute danger to self.  I have placed the patient under involuntary commitment and will consult psychiatry and TTS.  ----------------------------------------- 10:07 PM on 10/13/2018 -----------------------------------------  Lab work-up is unremarkable.  The patient is alert and walking with steady gait.  He is medically cleared at this time.  Disposition will be pending psychiatry team evaluation.  ____________________________________________   FINAL CLINICAL IMPRESSION(S) / ED DIAGNOSES  Final diagnoses:  Medication overdose, undetermined intent, initial encounter      NEW MEDICATIONS STARTED DURING THIS VISIT:  New Prescriptions   No medications on file     Note:  This document was prepared using Dragon voice recognition software and may include unintentional dictation errors.    Dionne BucySiadecki, Ashika Apuzzo, MD 10/13/18 2207

## 2018-10-13 NOTE — ED Notes (Signed)

## 2018-10-13 NOTE — ED Notes (Signed)
Pt. To BHU from ED ambulatory without difficulty, to room  BHU 3. Report from Amy RN. Pt. Is alert and oriented, warm and dry in no distress. Pt. Denies SI, HI, and AVH. Pt. Calm and cooperative. Pt. Made aware of security cameras and Q15 minute rounds. Pt. Encouraged to let Nursing staff know of any concerns or needs.   

## 2018-10-13 NOTE — ED Notes (Signed)
Pt states he was trying to get high.  Hx of doing so with benadryl and coffee.  Pt denies SI or HI.  Pt denies etoh use or other drug use.

## 2018-10-13 NOTE — ED Notes (Addendum)
1 pair tan boots, 1 jeans, 1 gray tee shirt, 1 pair white socks, 1 cell phone, 1 black wallet with id card.  Pt wearing eye glasses and refused at this time to take hat off.

## 2018-10-13 NOTE — ED Notes (Signed)
Pt to bhu with officer and er tech.  Pt calm and cooperative.

## 2018-10-13 NOTE — ED Notes (Signed)
Pt sleeping  Iv fluids infusing.  siderails up x 2.

## 2018-10-13 NOTE — ED Notes (Signed)
IVC PENDING  CONSULT ?

## 2018-10-13 NOTE — ED Notes (Signed)
ED Provider at bedside. 

## 2018-10-14 DIAGNOSIS — T50901A Poisoning by unspecified drugs, medicaments and biological substances, accidental (unintentional), initial encounter: Secondary | ICD-10-CM | POA: Insufficient documentation

## 2018-10-14 DIAGNOSIS — F332 Major depressive disorder, recurrent severe without psychotic features: Secondary | ICD-10-CM

## 2018-10-14 DIAGNOSIS — T50904A Poisoning by unspecified drugs, medicaments and biological substances, undetermined, initial encounter: Secondary | ICD-10-CM

## 2018-10-14 NOTE — ED Notes (Signed)
Hourly rounding reveals patient in room. No complaints, stable, in no acute distress. Q15 minute rounds and monitoring via Security Cameras to continue. 

## 2018-10-14 NOTE — ED Notes (Signed)
Patient observed lying in bed with eyes closed  Even, unlabored respirations observed   NAD pt appears to be sleeping  I will continue to monitor along with every 15 minute visual observations and ongoing security camera monitoring    

## 2018-10-14 NOTE — Discharge Instructions (Addendum)
You have been seen in the emergency department for a  psychiatric concern. You have been evaluated both medically as well as psychiatrically. Please follow-up with your outpatient resources provided. Return to the emergency department for any worsening symptoms, or any thoughts of hurting yourself or anyone else so that we may attempt to help you. 

## 2018-10-14 NOTE — Consult Note (Signed)
[facility] Consult   Reason for Consult:Drug Overdose Referring Physician:  Dr. Cherylann Banas Patient Identification: Rodney Watson MRN:  [id number] Principal Diagnosis: Major depressive disorder, recurrent episode, severe ([place]) Diagnosis:  Principal Problem:   Major depressive disorder, recurrent episode, severe ([place]) Active Problems:   Alcohol abuse with alcohol-induced mood disorder ([place])   Alcohol withdrawal ([place])   Essential hypertension   Total Time spent with patient: 1 hour  Subjective: "I was not trying to kill myself.  I wanted to get a "buzz" from the Benadryl." Rodney Watson is a 53 y.o. male patient presented to [facility] ED via EMS from [facility] and [place] under involuntary commitment status (IVC).  Per ER triage nursing notes, the patient took approximately (39) 25 mg Benadryl between the hours of 11 AM to 4 PM on Monday, October 13, 2018.  He discussed with the triage RN that he was trying to get "high".  He endorses, having a history of taking Benadryl and coffee to get a "buzz".  The patient denies SI or HI.  He denies any alcohol use.  He states, on 911 of this year will be a year since I last drank any alcohol. The patient was seen face-to-face by this provider; the chart reviewed and consulted with Dr. Cherylann Banas on 10/13/2018 due to the patient's care. It was discussed with the EDP that the patient does not meet the criteria to be admitted to the inpatient unit.  Due to him voicing that this was not an intentional overdose but him wanting to experience a "buzz" feeling from taking so many Benadryl.  He reports "this is something I do for fun." On evaluation, the patient is alert and oriented x3, calm, cooperative, and mood-congruent with affect. The patient does not appear to be responding to internal or external stimuli. Neither is the patient presenting with any delusional thinking. The patient denies auditory or visual hallucinations. The patient  denies any suicidal, homicidal, or self-harm ideations.  The patient discussed the last time he attempted suicide was in 1989, which he tried to cut his wrist.  The patient is not presenting with any psychotic or paranoid behaviors. During an encounter with the patient, he was able to answer questions appropriately. Collateral was obtained by [place] [phone]) friend, who expresses concerns for the patient's consuming so many Benadryl and stating he was trying to get a "buzz.".  She voiced that her only concern is he will not  wake up from one of these attempts to get a "buzz."  She expressed, this is a habit of his, and he attempted this behavior about a month ago.  She states, "he needs help."    Plan: The patient is not a safety risk to self or others and does not require psychiatric inpatient admission for stabilization and treatment. The patient, on several occasions, admitted that this was not a suicide attempt.  He acknowledges that he does have a problem with wanting to feel "high," and this is how he does it. HPI: Per Dr. Cherylann Banas; Rodney Watson is a 53 y.o. male with PMH as noted below including polysubstance abuse who presents after a Benadryl overdose.  The patient states he took 36 or 39 25 mg Benadryl tablets between around 11 AM and 4 PM.  He states that he did this recreationally to get high and he has taken Benadryl before in this way with coffee to try to "get a buzz."  He denies SI or HI.  He  endorses marijuana use but denies any other drugs or alcohol.   Past Psychiatric History:  Alcohol abuse Crack cocaine use Depression Seizures (HCC)  Risk to Self:  No  Risk to Others:  No Prior Inpatient Therapy:  Yes Prior Outpatient Therapy:  Yes  Past Medical History:  Past Medical History:  Diagnosis Date  . Alcohol abuse   . Alcohol abuse 04/01/2017  . COPD (chronic obstructive pulmonary disease) (HCC)   . Crack cocaine use   . Depression   . ETOH abuse   .  Hypothyroidism   . Seizures (HCC)     Past Surgical History:  Procedure Laterality Date  . BACK SURGERY     cyst removed.   Family History:  Family History  Problem Relation Age of Onset  . Alcohol abuse Father   . Seizures Neg Hx    Family Psychiatric  History: History reviewed. No pertinent past psychiatric history Social History:  Social History   Substance and Sexual Activity  Alcohol Use Not Currently   Comment: 10/30/2017     Social History   Substance and Sexual Activity  Drug Use Yes  . Types: Marijuana, Cocaine   Comment: 1-2 joints/week    Social History   Socioeconomic History  . Marital status: Single    Spouse name: Not on file  . Number of children: Not on file  . Years of education: Not on file  . Highest education level: Not on file  Occupational History  . Not on file  Social Needs  . Financial resource strain: Not on file  . Food insecurity    Worry: Not on file    Inability: Not on file  . Transportation needs    Medical: Not on file    Non-medical: Not on file  Tobacco Use  . Smoking status: Current Every Day Smoker    Packs/day: 1.00    Years: 39.00    Pack years: 39.00    Types: Cigarettes  . Smokeless tobacco: Current User    Types: Chew  Substance and Sexual Activity  . Alcohol use: Not Currently    Comment: 10/30/2017  . Drug use: Yes    Types: Marijuana, Cocaine    Comment: 1-2 joints/week  . Sexual activity: Never  Lifestyle  . Physical activity    Days per week: Not on file    Minutes per session: Not on file  . Stress: Not on file  Relationships  . Social Musician on phone: Not on file    Gets together: Not on file    Attends religious service: Not on file    Active member of club or organization: Not on file    Attends meetings of clubs or organizations: Not on file    Relationship status: Not on file  Other Topics Concern  . Not on file  Social History Narrative   ** Merged History Encounter **        Additional Social History:    Allergies:   Allergies  Allergen Reactions  . Chocolate Hives  . Vicodin [Hydrocodone-Acetaminophen] Nausea And Vomiting    Labs:  Results for orders placed or performed during the hospital encounter of 10/13/18 (from the past 48 hour(s))  Urine Drug Screen, Qualitative     Status: Abnormal   Collection Time: 10/13/18  5:47 PM  Result Value Ref Range   Tricyclic, Ur Screen POSITIVE (A) NONE DETECTED   Amphetamines, Ur Screen NONE DETECTED NONE DETECTED   MDMA (Ecstasy)Ur  Screen NONE DETECTED NONE DETECTED   Cocaine Metabolite,Ur [place] NONE DETECTED NONE DETECTED   Opiate, Ur Screen NONE DETECTED NONE DETECTED   Phencyclidine (PCP) Ur S NONE DETECTED NONE DETECTED   Cannabinoid 50 Ng, Ur [place] POSITIVE (A) NONE DETECTED   Barbiturates, Ur Screen NONE DETECTED NONE DETECTED   Benzodiazepine, Ur Scrn NONE DETECTED NONE DETECTED   Methadone Scn, Ur NONE DETECTED NONE DETECTED    Comment: (NOTE) Tricyclics + metabolites, urine    Cutoff 1000 ng/mL Amphetamines + metabolites, urine  Cutoff 1000 ng/mL MDMA (Ecstasy), urine              Cutoff 500 ng/mL Cocaine Metabolite, urine          Cutoff 300 ng/mL Opiate + metabolites, urine        Cutoff 300 ng/mL Phencyclidine (PCP), urine         Cutoff 25 ng/mL Cannabinoid, urine                 Cutoff 50 ng/mL Barbiturates + metabolites, urine  Cutoff 200 ng/mL Benzodiazepine, urine              Cutoff 200 ng/mL Methadone, urine                   Cutoff 300 ng/mL The urine drug screen provides only a preliminary, unconfirmed analytical test result and should not be used for non-medical purposes. Clinical consideration and professional judgment should be applied to any positive drug screen result due to possible interfering substances. A more specific alternate chemical method must be used in order to obtain a confirmed analytical result. Gas chromatography / mass spectrometry (GC/MS) is the  preferred confirmat ory method. Performed at [facility], [address] [place], [place] [address]   cbc     Status: None   Collection Time: 10/13/18  5:52 PM  Result Value Ref Range   WBC 7.6 4.0 - 10.5 K/uL   RBC 5.40 4.22 - 5.81 MIL/uL   Hemoglobin 16.4 13.0 - 17.0 g/dL   HCT 29.549.4 62.139.0 - 30.852.0 %   MCV 91.5 80.0 - 100.0 fL   MCH 30.4 26.0 - 34.0 pg   MCHC 33.2 30.0 - 36.0 g/dL   RDW 65.713.8 84.611.5 - 96.215.5 %   Platelets 227 150 - 400 K/uL   nRBC 0.0 0.0 - 0.2 %    Comment: Performed at [facility], [address] [place], [place] [address]  Comprehensive metabolic panel     Status: None   Collection Time: 10/13/18  7:30 PM  Result Value Ref Range   Sodium 140 135 - 145 mmol/L   Potassium 3.5 3.5 - 5.1 mmol/L   Chloride 104 98 - 111 mmol/L   CO2 27 22 - 32 mmol/L   Glucose, Bld 71 70 - 99 mg/dL   BUN 12 6 - 20 mg/dL   Creatinine, Ser 1.320.77 0.61 - 1.24 mg/dL   Calcium 9.0 8.9 - 44.010.3 mg/dL   Total Protein 6.9 6.5 - 8.1 g/dL   Albumin 4.4 3.5 - 5.0 g/dL   AST 19 15 - 41 U/L   ALT 16 0 - 44 U/L   Alkaline Phosphatase 65 38 - 126 U/L   Total Bilirubin 0.4 0.3 - 1.2 mg/dL   GFR calc non Af Amer >60 >60 mL/min   GFR calc Af Amer >60 >60 mL/min   Anion gap 9 5 - 15    Comment: Performed at [facility], [address] [place], [place] [address]  Acetaminophen level     Status: Abnormal   Collection Time: 10/13/18  9:15 PM  Result Value Ref Range   Acetaminophen (Tylenol), Serum <10 (L) 10 - 30 ug/mL    Comment: (NOTE) Therapeutic concentrations vary significantly. A range of 10-30 ug/mL  may be an effective concentration for many patients. However, some  are best treated at concentrations outside of this range. Acetaminophen concentrations >150 ug/mL at 4 hours after ingestion  and >50 ug/mL at 12 hours after ingestion are often associated with  toxic reactions. Performed at [facility], [address] [place], [place] [address]    Salicylate level     Status: None   Collection Time: 10/13/18  9:15 PM  Result Value Ref Range   Salicylate Lvl <7.0 2.8 - 30.0 mg/dL    Comment: Performed at [facility], [address] [place], [place] [address]  Ethanol     Status: None   Collection Time: 10/13/18  9:15 PM  Result Value Ref Range   Alcohol, Ethyl (B) <10 <10 mg/dL    Comment: (NOTE) Lowest detectable limit for serum alcohol is 10 mg/dL. For medical purposes only. Performed at [facility], [address] [place], [place] [address]     No current facility-administered medications for this encounter.    Current Outpatient Medications  Medication Sig Dispense Refill  . albuterol (VENTOLIN HFA) 108 (90 Base) MCG/ACT inhaler Inhale 2 puffs into the lungs every 4 (four) hours as needed for wheezing or shortness of breath.    . gabapentin (NEURONTIN) 300 MG capsule Take 1 capsule (300 mg total) by mouth 3 (three) times daily. 90 capsule 0  . levothyroxine (SYNTHROID, LEVOTHROID) 25 MCG tablet Take 1 tablet (25 mcg total) by mouth daily before breakfast. 30 tablet 0  . lisinopril (ZESTRIL) 20 MG tablet Take 1 tablet (20 mg total) by mouth daily. 30 tablet 0  . naltrexone (DEPADE) 50 MG tablet Take 50 mg by mouth daily.    . phenytoin (DILANTIN) 100 MG ER capsule Take 1 capsule (100 mg total) by mouth 3 (three) times daily. (Patient taking differently: Take 100 mg by mouth 4 (four) times daily. ) 90 capsule 0  . simvastatin (ZOCOR) 20 MG tablet Take 20 mg by mouth daily.    . Tiotropium Bromide-Olodaterol (STIOLTO RESPIMAT) 2.5-2.5 MCG/ACT AERS Inhale 2 puffs into the lungs daily.      Musculoskeletal: Strength & Muscle Tone: within normal limits Gait & Station: unsteady Patient leans: N/A  Psychiatric Specialty Exam: Physical Exam  Nursing note and vitals reviewed. Constitutional: He is oriented to person, place, and time. He appears well-developed and well-nourished.  HENT:  Head:  Normocephalic.  Eyes: Pupils are equal, round, and reactive to light. Conjunctivae are normal.  Neck: Normal range of motion. Neck supple.  Cardiovascular: Normal rate.  Respiratory: Effort normal.  Musculoskeletal: Normal range of motion.  Neurological: He is alert and oriented to person, place, and time.  Skin: Skin is warm and dry.  Psychiatric: Thought content normal.    Review of Systems  Psychiatric/Behavioral: Positive for depression and substance abuse. The patient is nervous/anxious and has insomnia.   All other systems reviewed and are negative.   Blood pressure 129/88, pulse 77, temperature 98 F (36.7 C), temperature source Oral, resp. rate 18, SpO2 100 %.There is no height or weight on file to calculate BMI.  General Appearance: Casual  Eye Contact:  Good  Speech:  Clear and Coherent  Volume:  Normal  Mood:  Anxious and Depressed  Affect:  Appropriate  Thought Process:  Coherent  Orientation:  Full (Time, Place, and Person)  Thought Content:  WDL and Logical  Suicidal Thoughts:  No  Homicidal Thoughts:  No  Memory:  Immediate;   Good Recent;   Good Remote;   Good  Judgement:  Intact  Insight:  Fair  Psychomotor Activity:  Normal  Concentration:  Concentration: Good and Attention Span: Good  Recall:  Good  Fund of Knowledge:  Good  Language:  Good  Akathisia:  Negative  Handed:  Right  AIMS (if indicated):     Assets:  Communication Skills Housing Social Support  ADL's:  Intact  Cognition:  WNL  Sleep:   Insomnia     Treatment Plan Summary: Daily contact with patient to assess and evaluate symptoms and progress in treatment and Medication management  Disposition: No evidence of imminent risk to self or others at present.   Patient does not meet criteria for psychiatric inpatient admission. Supportive therapy provided about ongoing stressors.  Gillermo Murdoch, NP 10/14/2018 4:37 AM

## 2018-10-14 NOTE — ED Notes (Signed)

## 2018-10-14 NOTE — ED Notes (Signed)
Patient void at this time.

## 2018-10-14 NOTE — ED Notes (Signed)
IVC PAPERS  RESCINDED  PER TRAVIS  NP 

## 2018-10-14 NOTE — ED Notes (Signed)
ED BHU [place] Is the patient under IVC or is there intent for IVC: Yes.   Is the patient medically cleared: Yes.   Is there vacancy in the ED BHU: Yes.   Is the population mix appropriate for patient: Yes.   Is the patient awaiting placement in inpatient or outpatient setting: Yes   Has the patient had a psychiatric consult: Yes.   Survey of unit performed for contraband, proper placement and condition of furniture, tampering with fixtures in bathroom, shower, and each patient room: Yes.  ; Findings:  APPEARANCE/BEHAVIOR Calm and cooperative NEURO ASSESSMENT Orientation: oriented x3  Denies pain Hallucinations: No.None noted (Hallucinations)  Denies at present  Speech: Normal Gait: normal RESPIRATORY ASSESSMENT Even  Unlabored respirations  CARDIOVASCULAR ASSESSMENT Pulses equal   regular rate  Skin warm and dry   GASTROINTESTINAL ASSESSMENT no GI complaint EXTREMITIES Full ROM  PLAN OF CARE Provide calm/safe environment. Vital signs assessed twice daily. ED BHU Assessment once each 12-hour shift. Collaborate with TTS daily or as condition indicates. Assure the ED provider has rounded once each shift. Provide and encourage hygiene. Provide redirection as needed. Assess for escalating behavior; address immediately and inform ED provider.  Assess family dynamic and appropriateness for visitation as needed: Yes.  ; If necessary, describe findings:  Educate the patient/family about BHU procedures/visitation: Yes.  ; If necessary, describe findings:

## 2018-10-14 NOTE — ED Provider Notes (Signed)
-----------------------------------------   5:49 AM on 10/14/2018 -----------------------------------------   Blood pressure 129/88, pulse 77, temperature 98 F (36.7 C), temperature source Oral, resp. rate 18, SpO2 100 %.  The patient is calm and cooperative at this time.  There have been no acute events since the last update.  Awaiting disposition plan from Behavioral Medicine.   Paulette Blanch, MD 10/14/18 [id number]

## 2018-10-14 NOTE — ED Notes (Signed)
BEHAVIORAL HEALTH ROUNDING Patient sleeping: No. Patient alert and oriented: yes Behavior appropriate: Yes.  ; If no, describe:  Nutrition and fluids offered: yes Toileting and hygiene offered: Yes  Sitter present: q15 minute observations and security camera monitoring   

## 2018-10-14 NOTE — ED Provider Notes (Signed)
-----------------------------------------   10:44 AM on 10/14/2018 -----------------------------------------  Patient has been seen by psychiatry they believe the patient is safe for discharge home from a psychiatric standpoint.  Patient's medical work-up is been largely nonrevealing.  Patient will be discharged home at this time.   Harvest Dark, MD 10/14/18 1044

## 2018-10-14 NOTE — Consult Note (Signed)
Patient is seen by this provider face-to-face.  Patient continues to deny any suicidal or homicidal ideations and denies any hallucinations.  It was confirmed yesterday the patient is using Benadryl as a drug to get high off of.  Patient states today that he understands the consequences are as the risk of taking so many Benadryl to get high.  Patient states that he does not plan to do this again.  Patient was observed overnight for stability.  Patient confirms that he has a psychiatrist on [employer] and keeps regular appointments and gets his psychiatric medications from there.  Patient denies any depression or anxiety at this time.  Patient is not presenting with any signs or symptoms of paranoia or delusional thinking and does not show any signs of responding to internal or external stimuli.  I have rescinded the patient's IVC.

## 2018-10-15 ENCOUNTER — Encounter: Payer: Self-pay | Admitting: *Deleted

## 2018-10-15 NOTE — Progress Notes (Signed)
We have received neurology clearance from [facility].   They have placed the patient at low risk for surgery. Also, state that patient should have dilantin level early morning before morning dose (will schedule). Patient now alcohol free.   Note routed to Angie to re-schedule surgery for the patient.

## 2018-10-22 ENCOUNTER — Telehealth: Payer: Self-pay | Admitting: *Deleted

## 2018-10-22 NOTE — Telephone Encounter (Signed)
Patient contacted today at the request of Freda Munro, CMA.   Patient was notified that we did receive clearance from his PCP and [facility].   The patient's surgery is to be rescheduled for 11-05-18 at [facility] with Dr. Hampton Abbot.   Patient to have a phone interview and is aware they will be calling him.   COVID testing to be done on 10-31-18 between 8 am and 10:30 am.   Patient to see Dr. Hampton Abbot on 10-28-18 at 10:30 am. Further instructions to be reviewed with the patient at that time.

## 2018-10-28 ENCOUNTER — Other Ambulatory Visit: Payer: Self-pay

## 2018-10-28 ENCOUNTER — Encounter: Payer: Self-pay | Admitting: Surgery

## 2018-10-28 ENCOUNTER — Ambulatory Visit (INDEPENDENT_AMBULATORY_CARE_PROVIDER_SITE_OTHER): Payer: Medicaid Other | Admitting: Surgery

## 2018-10-28 VITALS — BP 112/68 | Temp 97.7°F | Ht 67.0 in | Wt 125.0 lb

## 2018-10-28 DIAGNOSIS — K4090 Unilateral inguinal hernia, without obstruction or gangrene, not specified as recurrent: Secondary | ICD-10-CM | POA: Diagnosis not present

## 2018-10-28 NOTE — Progress Notes (Signed)
10/28/2018  History of Present Illness: Rodney Watson is a 53 y.o. male presenting for follow up of right inguinal hernia.  His initial surgery was canceled as the patient had a recent seizure.  He has been seen by Neurology and cleared.  He also recently had an ED visit due to overdose of Benadryl, taking 30+ tablets.  He was cleared by Psychiatry and did not require admission.  He reports today that he's been doing well.  Denies any drug use or alcohol use.  He has been taking his Dilantin as indicated.  Denies any new symptoms or worsening pain with his right hernia.  Past Medical History: Past Medical History:  Diagnosis Date  . Alcohol abuse   . Alcohol abuse 04/01/2017  . COPD (chronic obstructive pulmonary disease) (HCC)   . Crack cocaine use   . Depression   . ETOH abuse   . Hypothyroidism   . Seizures (HCC)      Past Surgical History: Past Surgical History:  Procedure Laterality Date  . BACK SURGERY     cyst removed.    Home Medications: Prior to Admission medications   Medication Sig Start Date End Date Taking? Authorizing Provider  albuterol (VENTOLIN HFA) 108 (90 Base) MCG/ACT inhaler Inhale 2 puffs into the lungs every 4 (four) hours as needed for wheezing or shortness of breath.   Yes [provider]  gabapentin (NEURONTIN) 300 MG capsule Take 1 capsule (300 mg total) by mouth 3 (three) times daily. 10/29/17  Yes McNew, Ileene HutchinsonHolly R, MD  levothyroxine (SYNTHROID, LEVOTHROID) 25 MCG tablet Take 1 tablet (25 mcg total) by mouth daily before breakfast. 10/29/17  Yes McNew, Ileene HutchinsonHolly R, MD  lisinopril (ZESTRIL) 20 MG tablet Take 1 tablet (20 mg total) by mouth daily. 07/02/18  Yes Mayo, Allyn KennerKaty Dodd, MD  naltrexone (DEPADE) 50 MG tablet Take 50 mg by mouth daily.   Yes [provider]  simvastatin (ZOCOR) 20 MG tablet Take 20 mg by mouth daily.   Yes [provider]  Tiotropium Bromide-Olodaterol (STIOLTO RESPIMAT) 2.5-2.5 MCG/ACT AERS Inhale 2 puffs  into the lungs daily.   Yes [provider]  phenytoin (DILANTIN) 100 MG ER capsule Take 1 capsule (100 mg total) by mouth 3 (three) times daily. Patient taking differently: Take 100 mg by mouth 4 (four) times daily.  01/23/18 10/13/18  Ihor AustinPyreddy, Pavan, MD    Allergies: Allergies  Allergen Reactions  . Chocolate Hives  . Vicodin [Hydrocodone-Acetaminophen] Nausea And Vomiting    Review of Systems: Review of Systems  Constitutional: Negative for chills and fever.  Respiratory: Negative for shortness of breath.   Cardiovascular: Negative for chest pain.  Gastrointestinal: Negative for abdominal pain, nausea and vomiting.  Neurological: Negative for seizures.    Physical Exam BP 112/68   Temp 97.7 F (36.5 C) (Skin)   Ht 5\' 7"  (1.702 m)   Wt 125 lb (56.7 kg)   SpO2 98%   BMI 19.58 kg/m  CONSTITUTIONAL: No acute distress HEENT:  Normocephalic, atraumatic, extraocular motion intact. RESPIRATORY:  Lungs are clear, and breath sounds are equal bilaterally. Normal respiratory effort without pathologic use of accessory muscles. CARDIOVASCULAR: Heart is regular without murmurs, gallops, or rubs. GI: The abdomen is soft, non-distended, non-tender to palpation.  Has a reducible right inguinal . There were no palpable masses. There was no hepatosplenomegaly. NEUROLOGIC:  Motor and sensation is grossly normal.  Cranial nerves are grossly intact. PSYCH:  Alert and oriented to person, place and time. Affect is  normal.  Labs/Imaging: Labs 10/13/18: Na 140, K 3.5, Cl 104, CO2 27, BUN 12, Cr 0.77.  LFTs normal.  WBC 7.6, Hgb 16.4, Hct 49.4, Plt 227.   Assessment and Plan: This is a 53 y.o. male with right inguinal hernia.  Discussed again the procedure at length with the patient.  He has been cleared by medicine and neurology.  Will check dilantin level morning of surgery to make sure it's appropriate.  Will also check urine drug screen as well given his history.  He is aware of COVID  testing prior to surgery.  He is scheduled for surgery for right inguinal hernia repair on 9/16  Face-to-face time spent with the patient and care providers was 25 minutes, with more than 50% of the time spent counseling, educating, and coordinating care of the patient.     Melvyn Neth, [facility]

## 2018-10-28 NOTE — Patient Instructions (Signed)
Inguinal Hernia, Adult °An inguinal hernia develops when fat or the intestines push through a weak spot in a muscle where your leg meets your lower abdomen (groin). This creates a bulge. This kind of hernia could also be: °· In your scrotum, if you are male. °· In folds of skin around your vagina, if you are male. °There are three types of inguinal hernias: °· Hernias that can be pushed back into the abdomen (are reducible). This type rarely causes pain. °· Hernias that are not reducible (are incarcerated). °· Hernias that are not reducible and lose their blood supply (are strangulated). This type of hernia requires emergency surgery. °What are the causes? °This condition is caused by having a weak spot in the muscles or tissues in the groin. This weak spot develops over time. The hernia may poke through the weak spot when you suddenly strain your lower abdominal muscles, such as when you: °· Lift a heavy object. °· Strain to have a bowel movement. Constipation can lead to straining. °· Cough. °What increases the risk? °This condition is more likely to develop in: °· Men. °· Pregnant women. °· People who: °? Are overweight. °? Work in jobs that require long periods of standing or heavy lifting. °? Have had an inguinal hernia before. °? Smoke or have lung disease. These factors can lead to long-lasting (chronic) coughing. °What are the signs or symptoms? °Symptoms may depend on the size of the hernia. Often, a small inguinal hernia has no symptoms. Symptoms of a larger hernia may include: °· A lump in the groin area. This is easier to see when standing. It might not be visible when lying down. °· Pain or burning in the groin. This may get worse when lifting, straining, or coughing. °· A dull ache or a feeling of pressure in the groin. °· In men, an unusual lump in the scrotum. °Symptoms of a strangulated inguinal hernia may include: °· A bulge in your groin that is very painful and tender to the touch. °· A bulge  that turns red or purple. °· Fever, nausea, and vomiting. °· Inability to have a bowel movement or to pass gas. °How is this diagnosed? °This condition is diagnosed based on your symptoms, your medical history, and a physical exam. Your health care provider may feel your groin area and ask you to cough. °How is this treated? °Treatment depends on the size of your hernia and whether you have symptoms. If you do not have symptoms, your health care provider may have you watch your hernia carefully and have you come in for follow-up visits. If your hernia is large or if you have symptoms, you may need surgery to repair the hernia. °Follow these instructions at home: °Lifestyle °· Avoid lifting heavy objects. °· Avoid standing for long periods of time. °· Do not use any products that contain nicotine or tobacco, such as cigarettes and e-cigarettes. If you need help quitting, ask your health care provider. °· Maintain a healthy weight. °Preventing constipation °· Take actions to prevent constipation. Constipation leads to straining with bowel movements, which can make a hernia worse or cause a hernia repair to break down. Your health care provider may recommend that you: °? Drink enough fluid to keep your urine pale yellow. °? Eat foods that are high in fiber, such as fresh fruits and vegetables, whole grains, and beans. °? Limit foods that are high in fat and processed sugars, such as fried or sweet foods. °? Take an over-the-counter   or prescription medicine for constipation. °General instructions °· You may try to push the hernia back in place by very gently pressing on it while lying down. Do not try to force the bulge back in if it will not push in easily. °· Watch your hernia for any changes in shape, size, or color. Get help right away if you notice any changes. °· Take over-the-counter and prescription medicines only as told by your health care provider. °· Keep all follow-up visits as told by your health care  provider. This is important. °Contact a health care provider if: °· You have a fever. °· You develop new symptoms. °· Your symptoms get worse. °Get help right away if: °· You have pain in your groin that suddenly gets worse. °· You have a bulge in your groin that: °? Suddenly gets bigger and does not get smaller. °? Becomes red or purple or painful to the touch. °· You are a man and you have a sudden pain in your scrotum, or the size of your scrotum suddenly changes. °· You cannot push the hernia back in place by very gently pressing on it when you are lying down. Do not try to force the bulge back in if it will not push in easily. °· You have nausea or vomiting that does not go away. °· You have a fast heartbeat. °· You cannot have a bowel movement or pass gas. °These symptoms may represent a serious problem that is an emergency. Do not wait to see if the symptoms will go away. Get medical help right away. Call your local emergency services (911 in the [place] °Summary °· An inguinal hernia develops when fat or the intestines push through a weak spot in a muscle where your leg meets your lower abdomen (groin). °· This condition is caused by having a weak spot in muscles or tissue in your groin. °· Symptoms may depend on the size of the hernia, and they may include pain or swelling in your groin. A small inguinal hernia often has no symptoms. °· Treatment may not be needed if you do not have symptoms. If you have symptoms or a large hernia, you may need surgery to repair the hernia. °· Avoid lifting heavy objects. Also avoid standing for long amounts of time. °This information is not intended to replace advice given to you by your health care provider. Make sure you discuss any questions you have with your health care provider. °Document Released: 06/24/2008 Document Revised: 03/09/2017 Document Reviewed: 11/07/2016 °Elsevier Patient Education © 2020 [employer] ° °

## 2018-10-28 NOTE — H&P (View-Only) (Signed)
10/28/2018  History of Present Illness: Rodney Watson is a 53 y.o. male presenting for follow up of right inguinal hernia.  His initial surgery was canceled as the patient had a recent seizure.  He has been seen by Neurology and cleared.  He also recently had an ED visit due to overdose of Benadryl, taking 30+ tablets.  He was cleared by Psychiatry and did not require admission.  He reports today that he's been doing well.  Denies any drug use or alcohol use.  He has been taking his Dilantin as indicated.  Denies any new symptoms or worsening pain with his right hernia.  Past Medical History: Past Medical History:  Diagnosis Date  . Alcohol abuse   . Alcohol abuse 04/01/2017  . COPD (chronic obstructive pulmonary disease) (HCC)   . Crack cocaine use   . Depression   . ETOH abuse   . Hypothyroidism   . Seizures (HCC)      Past Surgical History: Past Surgical History:  Procedure Laterality Date  . BACK SURGERY     cyst removed.    Home Medications: Prior to Admission medications   Medication Sig Start Date End Date Taking? Authorizing Provider  albuterol (VENTOLIN HFA) 108 (90 Base) MCG/ACT inhaler Inhale 2 puffs into the lungs every 4 (four) hours as needed for wheezing or shortness of breath.   Yes [provider]  gabapentin (NEURONTIN) 300 MG capsule Take 1 capsule (300 mg total) by mouth 3 (three) times daily. 10/29/17  Yes McNew, Holly R, MD  levothyroxine (SYNTHROID, LEVOTHROID) 25 MCG tablet Take 1 tablet (25 mcg total) by mouth daily before breakfast. 10/29/17  Yes McNew, Holly R, MD  lisinopril (ZESTRIL) 20 MG tablet Take 1 tablet (20 mg total) by mouth daily. 07/02/18  Yes Mayo, Katy Dodd, MD  naltrexone (DEPADE) 50 MG tablet Take 50 mg by mouth daily.   Yes [provider]  simvastatin (ZOCOR) 20 MG tablet Take 20 mg by mouth daily.   Yes [provider]  Tiotropium Bromide-Olodaterol (STIOLTO RESPIMAT) 2.5-2.5 MCG/ACT AERS Inhale 2 puffs  into the lungs daily.   Yes [provider]  phenytoin (DILANTIN) 100 MG ER capsule Take 1 capsule (100 mg total) by mouth 3 (three) times daily. Patient taking differently: Take 100 mg by mouth 4 (four) times daily.  01/23/18 10/13/18  Pyreddy, Pavan, MD    Allergies: Allergies  Allergen Reactions  . Chocolate Hives  . Vicodin [Hydrocodone-Acetaminophen] Nausea And Vomiting    Review of Systems: Review of Systems  Constitutional: Negative for chills and fever.  Respiratory: Negative for shortness of breath.   Cardiovascular: Negative for chest pain.  Gastrointestinal: Negative for abdominal pain, nausea and vomiting.  Neurological: Negative for seizures.    Physical Exam BP 112/68   Temp 97.7 F (36.5 C) (Skin)   Ht 5' 7" (1.702 m)   Wt 125 lb (56.7 kg)   SpO2 98%   BMI 19.58 kg/m  CONSTITUTIONAL: No acute distress HEENT:  Normocephalic, atraumatic, extraocular motion intact. RESPIRATORY:  Lungs are clear, and breath sounds are equal bilaterally. Normal respiratory effort without pathologic use of accessory muscles. CARDIOVASCULAR: Heart is regular without murmurs, gallops, or rubs. GI: The abdomen is soft, non-distended, non-tender to palpation.  Has a reducible right inguinal . There were no palpable masses. There was no hepatosplenomegaly. NEUROLOGIC:  Motor and sensation is grossly normal.  Cranial nerves are grossly intact. PSYCH:  Alert and oriented to person, place and time. Affect is   normal.  Labs/Imaging: Labs 10/13/18: Na 140, K 3.5, Cl 104, CO2 27, BUN 12, Cr 0.77.  LFTs normal.  WBC 7.6, Hgb 16.4, Hct 49.4, Plt 227.   Assessment and Plan: This is a 53 y.o. male with right inguinal hernia.  Discussed again the procedure at length with the patient.  He has been cleared by medicine and neurology.  Will check dilantin level morning of surgery to make sure it's appropriate.  Will also check urine drug screen as well given his history.  He is aware of COVID  testing prior to surgery.  He is scheduled for surgery for right inguinal hernia repair on 9/16  Face-to-face time spent with the patient and care providers was 25 minutes, with more than 50% of the time spent counseling, educating, and coordinating care of the patient.     Melvyn Neth, [facility]

## 2018-10-29 ENCOUNTER — Other Ambulatory Visit: Payer: Self-pay

## 2018-10-29 ENCOUNTER — Encounter
Admission: RE | Admit: 2018-10-29 | Discharge: 2018-10-29 | Disposition: A | Discharge status: Home / Self Care | Payer: Medicaid Other | Source: Ambulatory Visit | Attending: Surgery | Admitting: Surgery

## 2018-10-29 NOTE — Patient Instructions (Signed)
Your procedure is scheduled on: Wednesday 11/05/18.  Report to DAY SURGERY DEPARTMENT LOCATED ON 2ND FLOOR MEDICAL MALL ENTRANCE. To find out your arrival time please call [phone] between 1PM - 3PM on Tuesday 11/04/18.   Remember: Instructions that are not followed completely may result in serious medical risk, up to and including death, or upon the discretion of your surgeon and anesthesiologist your surgery may need to be rescheduled.      _X__ 1. Do not eat food after midnight the night before your procedure.                 No gum chewing or hard candies. You may drink clear liquids up to 2 hours                 before you are scheduled to arrive for your surgery- DO NOT drink clear                 liquids within 2 hours of the start of your surgery.                 Clear Liquids include:  water, apple juice without pulp, clear carbohydrate                 drink such as Clearfast or Gatorade, Black Coffee or Tea (Do not add                 milk or creamer to coffee or tea).   __X__2.  On the morning of surgery brush your teeth with toothpaste and water, you may rinse your mouth with mouthwash if you wish.  Do not swallow any toothpaste or mouthwash.      _X__ 3.  No Alcohol for 24 hours before or after surgery.    _X__ 4.  Do Not Smoke or use e-cigarettes For 24 Hours Prior to Your Surgery.                 Do not use any chewable tobacco products for at least 6 hours prior to                 Surgery.   ____  5.  Bring all medications with you on the day of surgery if instructed.    __X__6.  Notify your doctor if there is any change in your medical condition      (cold, fever, infections).      Do not wear jewelry, make-up, hairpins, clips or nail polish. Do not wear lotions, powders, or perfumes.  Do not shave 48 hours prior to surgery. Men may shave face and neck. Do not bring valuables to the hospital.     [facility] is not responsible for any belongings or  valuables.   Contacts, dentures/partials or body piercings may not be worn into surgery. Bring a case for your contacts, glasses or hearing aids, a denture cup will be supplied. Patients discharged the day of surgery will not be allowed to drive home.    Please read over the following fact sheets that you were given:   MRSA Information   __X__ Take these medicines the morning of surgery with A SIP OF WATER:     1. albuterol (VENTOLIN HFA) 108 (90 Base) MCG/ACT inhaler  2. Tiotropium Bromide-Olodaterol (STIOLTO RESPIMAT) 2.5-2.5 MCG/ACT AERS  3. gabapentin (NEURONTIN) 300 MG capsule  4. levothyroxine (SYNTHROID, LEVOTHROID) 25 MCG tablet  5. naltrexone (DEPADE) 50 MG tablet  6. phenytoin (DILANTIN) 100 MG  ER capsule     __X__ Use CHG Soap as directed     __X__ Stop Anti-inflammatories 7 days before surgery such as Advil, Ibuprofen, Motrin, BC or Goodies Powder, Naprosyn, Naproxen, Aleve, Aspirin, Meloxicam. May take Tylenol if needed for pain or discomfort.     __X__ Do not begin taking any new herbal supplements before your procedure.

## 2018-10-31 ENCOUNTER — Other Ambulatory Visit
Admission: RE | Admit: 2018-10-31 | Discharge: 2018-10-31 | Disposition: A | Discharge status: Home / Self Care | Payer: Medicaid Other | Source: Ambulatory Visit | Attending: Surgery | Admitting: Surgery

## 2018-10-31 ENCOUNTER — Other Ambulatory Visit: Payer: Self-pay

## 2018-10-31 DIAGNOSIS — K4091 Unilateral inguinal hernia, without obstruction or gangrene, recurrent: Secondary | ICD-10-CM | POA: Insufficient documentation

## 2018-10-31 DIAGNOSIS — Z20828 Contact with and (suspected) exposure to other viral communicable diseases: Secondary | ICD-10-CM | POA: Insufficient documentation

## 2018-10-31 DIAGNOSIS — Z01812 Encounter for preprocedural laboratory examination: Secondary | ICD-10-CM | POA: Insufficient documentation

## 2018-11-01 LAB — SARS CORONAVIRUS 2 (TAT 6-24 HRS): SARS Coronavirus 2: NEGATIVE

## 2018-11-05 ENCOUNTER — Encounter
Admission: RE | Disposition: A | Discharge status: Home / Self Care | Payer: Self-pay | Source: Home / Self Care | Attending: Surgery

## 2018-11-05 ENCOUNTER — Ambulatory Visit: Payer: Medicaid Other | Admitting: Certified Registered Nurse Anesthetist

## 2018-11-05 ENCOUNTER — Encounter: Payer: Self-pay | Admitting: *Deleted

## 2018-11-05 ENCOUNTER — Telehealth: Payer: Self-pay | Admitting: Surgery

## 2018-11-05 ENCOUNTER — Ambulatory Visit
Admission: RE | Admit: 2018-11-05 | Discharge: 2018-11-05 | Disposition: A | Discharge status: Home / Self Care | Payer: Medicaid Other | Attending: Surgery | Admitting: Surgery

## 2018-11-05 DIAGNOSIS — R569 Unspecified convulsions: Secondary | ICD-10-CM | POA: Insufficient documentation

## 2018-11-05 DIAGNOSIS — K4090 Unilateral inguinal hernia, without obstruction or gangrene, not specified as recurrent: Secondary | ICD-10-CM | POA: Insufficient documentation

## 2018-11-05 DIAGNOSIS — E039 Hypothyroidism, unspecified: Secondary | ICD-10-CM | POA: Insufficient documentation

## 2018-11-05 DIAGNOSIS — J449 Chronic obstructive pulmonary disease, unspecified: Secondary | ICD-10-CM | POA: Insufficient documentation

## 2018-11-05 DIAGNOSIS — Z5309 Procedure and treatment not carried out because of other contraindication: Secondary | ICD-10-CM | POA: Insufficient documentation

## 2018-11-05 DIAGNOSIS — Z79890 Hormone replacement therapy (postmenopausal): Secondary | ICD-10-CM | POA: Insufficient documentation

## 2018-11-05 DIAGNOSIS — Z79899 Other long term (current) drug therapy: Secondary | ICD-10-CM | POA: Diagnosis not present

## 2018-11-05 LAB — URINE DRUG SCREEN, QUALITATIVE (ARMC ONLY)
Amphetamines, Ur Screen: NOT DETECTED
Barbiturates, Ur Screen: NOT DETECTED
Benzodiazepine, Ur Scrn: NOT DETECTED
Cannabinoid 50 Ng, Ur ~~LOC~~: POSITIVE — AB
Cocaine Metabolite,Ur ~~LOC~~: NOT DETECTED
MDMA (Ecstasy)Ur Screen: NOT DETECTED
Methadone Scn, Ur: NOT DETECTED
Opiate, Ur Screen: NOT DETECTED
Phencyclidine (PCP) Ur S: NOT DETECTED
Tricyclic, Ur Screen: NOT DETECTED

## 2018-11-05 LAB — PHENYTOIN LEVEL, TOTAL: Phenytoin Lvl: 8.4 ug/mL — ABNORMAL LOW (ref 10.0–20.0)

## 2018-11-05 SURGERY — REPAIR, HERNIA, INGUINAL, ADULT
Anesthesia: General | Laterality: Right

## 2018-11-05 MED ORDER — ONDANSETRON HCL 4 MG/2ML IJ SOLN
INTRAMUSCULAR | Status: AC
  Filled 2018-11-05: qty 2

## 2018-11-05 MED ORDER — GABAPENTIN 300 MG PO CAPS
ORAL_CAPSULE | ORAL | Status: AC
  Administered 2018-11-05: 11:00:00 300 mg via ORAL
  Filled 2018-11-05: qty 1

## 2018-11-05 MED ORDER — ACETAMINOPHEN 500 MG PO TABS
ORAL_TABLET | ORAL | Status: AC
  Filled 2018-11-05: qty 2

## 2018-11-05 MED ORDER — MIDAZOLAM HCL 2 MG/2ML IJ SOLN
INTRAMUSCULAR | Status: AC
  Filled 2018-11-05: qty 2

## 2018-11-05 MED ORDER — CEFAZOLIN SODIUM-DEXTROSE 2-4 GM/100ML-% IV SOLN: 2.0000 g | INTRAVENOUS | Status: DC

## 2018-11-05 MED ORDER — CHLORHEXIDINE GLUCONATE CLOTH 2 % EX PADS: 6.0000 | MEDICATED_PAD | Freq: Once | CUTANEOUS | Status: DC

## 2018-11-05 MED ORDER — LIDOCAINE HCL (PF) 2 % IJ SOLN
INTRAMUSCULAR | Status: AC
  Filled 2018-11-05: qty 10

## 2018-11-05 MED ORDER — LACTATED RINGERS IV SOLN
INTRAVENOUS | Status: DC
  Administered 2018-11-05: 11:00:00 via INTRAVENOUS

## 2018-11-05 MED ORDER — BUPIVACAINE LIPOSOME 1.3 % IJ SUSP
INTRAMUSCULAR | Status: AC
  Filled 2018-11-05: qty 20

## 2018-11-05 MED ORDER — CHLORHEXIDINE GLUCONATE CLOTH 2 % EX PADS
6.0000 | MEDICATED_PAD | Freq: Once | CUTANEOUS | Status: AC
  Administered 2018-11-05: 6 via TOPICAL

## 2018-11-05 MED ORDER — ROCURONIUM BROMIDE 50 MG/5ML IV SOLN
INTRAVENOUS | Status: AC
  Filled 2018-11-05: qty 1

## 2018-11-05 MED ORDER — FAMOTIDINE 20 MG PO TABS
20.0000 mg | ORAL_TABLET | Freq: Once | ORAL | Status: AC
  Administered 2018-11-05: 11:00:00 20 mg via ORAL

## 2018-11-05 MED ORDER — FAMOTIDINE 20 MG PO TABS
ORAL_TABLET | ORAL | Status: AC
  Administered 2018-11-05: 11:00:00 20 mg via ORAL
  Filled 2018-11-05: qty 1

## 2018-11-05 MED ORDER — FENTANYL CITRATE (PF) 100 MCG/2ML IJ SOLN
INTRAMUSCULAR | Status: AC
  Filled 2018-11-05: qty 2

## 2018-11-05 MED ORDER — ACETAMINOPHEN 500 MG PO TABS: 1000.0000 mg | ORAL_TABLET | ORAL | Status: DC

## 2018-11-05 MED ORDER — CEFAZOLIN SODIUM-DEXTROSE 2-4 GM/100ML-% IV SOLN
INTRAVENOUS | Status: AC
  Filled 2018-11-05: qty 100

## 2018-11-05 MED ORDER — BUPIVACAINE HCL (PF) 0.25 % IJ SOLN
INTRAMUSCULAR | Status: AC
  Filled 2018-11-05: qty 30

## 2018-11-05 MED ORDER — GABAPENTIN 300 MG PO CAPS
300.0000 mg | ORAL_CAPSULE | ORAL | Status: AC
  Administered 2018-11-05: 11:00:00 300 mg via ORAL

## 2018-11-05 MED ORDER — BUPIVACAINE LIPOSOME 1.3 % IJ SUSP: 20.0000 mL | Freq: Once | INTRAMUSCULAR | Status: DC

## 2018-11-05 MED ORDER — PROPOFOL 10 MG/ML IV BOLUS
INTRAVENOUS | Status: AC
  Filled 2018-11-05: qty 20

## 2018-11-05 SURGICAL SUPPLY — 32 items
BLADE SURG 15 STRL LF DISP TIS (BLADE) IMPLANT
BLADE SURG 15 STRL SS (BLADE)
CANISTER SUCT 1200ML W/VALVE (MISCELLANEOUS) IMPLANT
CHLORAPREP W/TINT 26 (MISCELLANEOUS) IMPLANT
COVER WAND RF STERILE (DRAPES) IMPLANT
DERMABOND ADVANCED (GAUZE/BANDAGES/DRESSINGS)
DERMABOND ADVANCED .7 DNX12 (GAUZE/BANDAGES/DRESSINGS) IMPLANT
DRAIN PENROSE 1/4X12 LTX STRL (WOUND CARE) IMPLANT
DRAPE LAPAROTOMY 100X77 ABD (DRAPES) IMPLANT
ELECT CAUTERY BLADE 6.4 (BLADE) IMPLANT
ELECT REM PT RETURN 9FT ADLT (ELECTROSURGICAL) ×3
ELECTRODE REM PT RTRN 9FT ADLT (ELECTROSURGICAL) ×1 IMPLANT
GLOVE SURG SYN 7.0 (GLOVE) IMPLANT
GLOVE SURG SYN 7.5  E (GLOVE)
GLOVE SURG SYN 7.5 E (GLOVE) IMPLANT
GOWN STRL REUS W/ TWL LRG LVL3 (GOWN DISPOSABLE) IMPLANT
GOWN STRL REUS W/TWL LRG LVL3 (GOWN DISPOSABLE)
LABEL OR SOLS (LABEL) IMPLANT
NEEDLE HYPO 22GX1.5 SAFETY (NEEDLE) IMPLANT
NS IRRIG 500ML POUR BTL (IV SOLUTION) IMPLANT
PACK BASIN MINOR ARMC (MISCELLANEOUS) IMPLANT
SPONGE LAP 18X18 RF (DISPOSABLE) IMPLANT
SUT MNCRL 4-0 (SUTURE)
SUT MNCRL 4-0 27XMFL (SUTURE)
SUT PROLENE 2 0 SH DA (SUTURE) IMPLANT
SUT VIC AB 2-0 CT1 (SUTURE) IMPLANT
SUT VIC AB 3-0 SH 27 (SUTURE)
SUT VIC AB 3-0 SH 27X BRD (SUTURE) IMPLANT
SUTURE MNCRL 4-0 27XMF (SUTURE) IMPLANT
SYR 10ML LL (SYRINGE) IMPLANT
SYR 30ML LL (SYRINGE) IMPLANT
SYR BULB IRRIG 60ML STRL (SYRINGE) IMPLANT

## 2018-11-05 NOTE — Interval H&P Note (Signed)
History and Physical Interval Note:  11/05/2018 11:58 AM  Rodney Watson  has presented today for surgery, with the diagnosis of Right inguinal hernia, K40.91.  The various methods of treatment have been discussed with the patient and family. After consideration of risks, benefits and other options for treatment, the patient has consented to  Procedure(s): HERNIA REPAIR INGUINAL ADULT (Right) as a surgical intervention.  The patient's history has been reviewed, patient examined, no change in status, stable for surgery.  I have reviewed the patient's chart and labs.  Questions were answered to the patient's satisfaction.     Ivie Savitt

## 2018-11-05 NOTE — Progress Notes (Signed)
11/05/18 12:33 pm  Patient's Phenytoin level came back at 8.4.  This is lower than his level when he presented to the ED on 7/14 after a seizure.  His level then was 8.9.  Discussed with him that with his level being lower, I would be canceling surgery today.  Unfortunately he is at risk of having a seizure during surgery.  He reports that he has been taking his Dilantin 100 mg three times as day as indicated and he has not skipped any doses.  If that's the case, then it may be that his medication dose needs to be increased.  At this point it is not safe to proceed with surgery and will cancel today.  The patient is clearly disappointed and is mad.  He says that if we do not do surgery today, he does not want surgery in the future and he "will die with his hernia".  I informed him that it is not necessary to go to that extreme, and I did offer that we can see him in the future when his level is better controlled to reschedule, but he declined.  Nevertheless, I will send a message to his PCP and neurologist so they can help manage his Dilantin.  Olean Ree, MD

## 2018-11-05 NOTE — OR Nursing (Signed)
Surgery cancelled today per Dr Hampton Abbot due to Dilantin levels too low.

## 2018-11-05 NOTE — Anesthesia Preprocedure Evaluation (Deleted)
Anesthesia Evaluation  Patient identified by MRN, date of birth, ID band Patient awake    Reviewed: Allergy & Precautions, NPO status , Patient's Chart, lab work & pertinent test results  Airway Mallampati: II       Dental   Pulmonary COPD, Current Smoker,    Pulmonary exam normal        Cardiovascular hypertension, Normal cardiovascular exam     Neuro/Psych Seizures -,  PSYCHIATRIC DISORDERS Depression    GI/Hepatic Neg liver ROS, (+)     substance abuse  alcohol use and cocaine use,   Endo/Other  Hypothyroidism   Renal/GU negative Renal ROS  negative genitourinary   Musculoskeletal negative musculoskeletal ROS (+)   Abdominal Normal abdominal exam  (+)   Peds negative pediatric ROS (+)  Hematology   Anesthesia Other Findings   Reproductive/Obstetrics                             Anesthesia Physical Anesthesia Plan  ASA: III  Anesthesia Plan: General   Post-op Pain Management:    Induction: Intravenous  PONV Risk Score and Plan:   Airway Management Planned: Oral ETT  Additional Equipment:   Intra-op Plan:   Post-operative Plan: Extubation in OR  Informed Consent: I have reviewed the patients History and Physical, chart, labs and discussed the procedure including the risks, benefits and alternatives for the proposed anesthesia with the patient or authorized representative who has indicated his/her understanding and acceptance.     Dental advisory given  Plan Discussed with: CRNA and Surgeon  Anesthesia Plan Comments:         Anesthesia Quick Evaluation

## 2018-11-05 NOTE — Telephone Encounter (Signed)
Patient's case canceled today as his Phenytoin level was low.  Patient does not wish to reschedule surgery in the future.

## 2019-08-29 ENCOUNTER — Inpatient Hospital Stay
Admission: EM | Admit: 2019-08-29 | Discharge: 2019-09-01 | DRG: 917 | Disposition: A | Discharge status: Home / Self Care | Payer: Medicaid Other | Attending: Internal Medicine | Admitting: Internal Medicine

## 2019-08-29 ENCOUNTER — Other Ambulatory Visit: Payer: Self-pay

## 2019-08-29 DIAGNOSIS — Z885 Allergy status to narcotic agent status: Secondary | ICD-10-CM

## 2019-08-29 DIAGNOSIS — G92 Toxic encephalopathy: Secondary | ICD-10-CM | POA: Diagnosis present

## 2019-08-29 DIAGNOSIS — Z20822 Contact with and (suspected) exposure to covid-19: Secondary | ICD-10-CM | POA: Diagnosis present

## 2019-08-29 DIAGNOSIS — Z91018 Allergy to other foods: Secondary | ICD-10-CM

## 2019-08-29 DIAGNOSIS — F1010 Alcohol abuse, uncomplicated: Secondary | ICD-10-CM | POA: Diagnosis present

## 2019-08-29 DIAGNOSIS — Z79890 Hormone replacement therapy (postmenopausal): Secondary | ICD-10-CM

## 2019-08-29 DIAGNOSIS — F4310 Post-traumatic stress disorder, unspecified: Secondary | ICD-10-CM | POA: Diagnosis present

## 2019-08-29 DIAGNOSIS — F17210 Nicotine dependence, cigarettes, uncomplicated: Secondary | ICD-10-CM | POA: Diagnosis present

## 2019-08-29 DIAGNOSIS — I10 Essential (primary) hypertension: Secondary | ICD-10-CM | POA: Diagnosis present

## 2019-08-29 DIAGNOSIS — J449 Chronic obstructive pulmonary disease, unspecified: Secondary | ICD-10-CM | POA: Diagnosis present

## 2019-08-29 DIAGNOSIS — F419 Anxiety disorder, unspecified: Secondary | ICD-10-CM

## 2019-08-29 DIAGNOSIS — Z811 Family history of alcohol abuse and dependence: Secondary | ICD-10-CM

## 2019-08-29 DIAGNOSIS — Z23 Encounter for immunization: Secondary | ICD-10-CM

## 2019-08-29 DIAGNOSIS — T450X5A Adverse effect of antiallergic and antiemetic drugs, initial encounter: Secondary | ICD-10-CM | POA: Diagnosis present

## 2019-08-29 DIAGNOSIS — F319 Bipolar disorder, unspecified: Secondary | ICD-10-CM | POA: Diagnosis present

## 2019-08-29 DIAGNOSIS — Z79899 Other long term (current) drug therapy: Secondary | ICD-10-CM

## 2019-08-29 DIAGNOSIS — T450X1A Poisoning by antiallergic and antiemetic drugs, accidental (unintentional), initial encounter: Secondary | ICD-10-CM | POA: Diagnosis present

## 2019-08-29 DIAGNOSIS — T420X1A Poisoning by hydantoin derivatives, accidental (unintentional), initial encounter: Secondary | ICD-10-CM | POA: Diagnosis not present

## 2019-08-29 DIAGNOSIS — E039 Hypothyroidism, unspecified: Secondary | ICD-10-CM | POA: Diagnosis present

## 2019-08-29 DIAGNOSIS — G9341 Metabolic encephalopathy: Secondary | ICD-10-CM

## 2019-08-29 DIAGNOSIS — E785 Hyperlipidemia, unspecified: Secondary | ICD-10-CM | POA: Diagnosis present

## 2019-08-29 DIAGNOSIS — G40909 Epilepsy, unspecified, not intractable, without status epilepticus: Secondary | ICD-10-CM | POA: Diagnosis present

## 2019-08-29 LAB — COMPREHENSIVE METABOLIC PANEL
ALT: 15 U/L (ref 0–44)
AST: 21 U/L (ref 15–41)
Albumin: 5 g/dL (ref 3.5–5.0)
Alkaline Phosphatase: 62 U/L (ref 38–126)
Anion gap: 8 (ref 5–15)
BUN: 14 mg/dL (ref 6–20)
CO2: 28 mmol/L (ref 22–32)
Calcium: 9.4 mg/dL (ref 8.9–10.3)
Chloride: 107 mmol/L (ref 98–111)
Creatinine, Ser: 0.79 mg/dL (ref 0.61–1.24)
GFR calc Af Amer: >60 mL/min (ref >60)
GFR calc non Af Amer: >60 mL/min (ref >60)
Glucose, Bld: 97 mg/dL (ref 70–99)
Potassium: 4.2 mmol/L (ref 3.5–5.1)
Sodium: 143 mmol/L (ref 135–145)
Total Bilirubin: 0.7 mg/dL (ref 0.3–1.2)
Total Protein: 8 g/dL (ref 6.5–8.1)

## 2019-08-29 LAB — URINALYSIS, COMPLETE (UACMP) WITH MICROSCOPIC
Bacteria, UA: NONE SEEN
Bilirubin Urine: NEGATIVE
Glucose, UA: NEGATIVE mg/dL
Ketones, ur: NEGATIVE mg/dL
Leukocytes,Ua: NEGATIVE
Nitrite: NEGATIVE
Protein, ur: NEGATIVE mg/dL
Specific Gravity, Urine: 1.006 (ref 1.005–1.030)
Squamous Epithelial / HPF: NONE SEEN (ref 0–5)
pH: 7 (ref 5.0–8.0)

## 2019-08-29 LAB — ETHANOL: Alcohol, Ethyl (B): <10 mg/dL (ref <10)

## 2019-08-29 LAB — URINE DRUG SCREEN, QUALITATIVE (ARMC ONLY)
Amphetamines, Ur Screen: NOT DETECTED
Barbiturates, Ur Screen: NOT DETECTED
Benzodiazepine, Ur Scrn: NOT DETECTED
Cannabinoid 50 Ng, Ur ~~LOC~~: POSITIVE — AB
Cocaine Metabolite,Ur ~~LOC~~: NOT DETECTED
MDMA (Ecstasy)Ur Screen: NOT DETECTED
Methadone Scn, Ur: NOT DETECTED
Opiate, Ur Screen: NOT DETECTED
Phencyclidine (PCP) Ur S: NOT DETECTED
Tricyclic, Ur Screen: NOT DETECTED

## 2019-08-29 LAB — CBC
HCT: 48.1 % (ref 39.0–52.0)
Hemoglobin: 16.1 g/dL (ref 13.0–17.0)
MCH: 29.9 pg (ref 26.0–34.0)
MCHC: 33.5 g/dL (ref 30.0–36.0)
MCV: 89.4 fL (ref 80.0–100.0)
Platelets: 195 10*3/uL (ref 150–400)
RBC: 5.38 MIL/uL (ref 4.22–5.81)
RDW: 15 % (ref 11.5–15.5)
WBC: 9.5 10*3/uL (ref 4.0–10.5)
nRBC: 0 % (ref 0.0–0.2)

## 2019-08-29 LAB — ACETAMINOPHEN LEVEL: Acetaminophen (Tylenol), Serum: <10 ug/mL — ABNORMAL LOW (ref 10–30)

## 2019-08-29 LAB — PHENYTOIN LEVEL, TOTAL: Phenytoin Lvl: 44.3 ug/mL (ref 10.0–20.0)

## 2019-08-29 LAB — SARS CORONAVIRUS 2 BY RT PCR (HOSPITAL ORDER, PERFORMED IN ~~LOC~~ HOSPITAL LAB): SARS Coronavirus 2: NEGATIVE

## 2019-08-29 LAB — SALICYLATE LEVEL: Salicylate Lvl: <7 mg/dL — ABNORMAL LOW (ref 7.0–30.0)

## 2019-08-29 MED ORDER — LACTATED RINGERS IV BOLUS
1000.0000 mL | Freq: Once | INTRAVENOUS | Status: AC
  Administered 2019-08-29: 1000 mL via INTRAVENOUS

## 2019-08-29 NOTE — H&P (Signed)
History and Physical   [facility] [place] @ [facility] Admission History and Physical [employer], D.O.    Patient Name: Rodney Watson MR#: [id number] Date of Birth: 1965-04-10 Date of Admission: 08/29/2019  Referring MD/NP/PA: Dr. Larinda Buttery Primary Care Physician: Evelene Croon, MD  Chief Complaint:  Chief Complaint  Patient presents with  . Drug Overdose    HPI: Rodney Watson is a 54 y.o. male with a known history of seizure disorder, EtOH use disorder, hypertension, hypothyroidism depression, COPD presents to the emergency department for evaluation of altered mental status, questionable overdose.  Patient was in a usual state of health until earlier today he was found altered and less responsive, [place] [facility].  patient states that he took 9 tabs of 100 mg of Benadryl and drank 2 beers today.  He denies suicidal or homicidal ideations. States his last drink was over two years ago.   Patient denies fevers/chills, weakness, dizziness, chest pain, shortness of breath, N/V/C/D, abdominal pain, dysuria/frequency, changes in mental status.    Otherwise there has been no change in status. Patient has been taking medication as prescribed and there has been no recent change in medication or diet.  No recent antibiotics.  There has been no recent illness, hospitalizations, travel or sick contacts.    EMS/ED Course: Patient received lactated Ringer's. Medical admission has been requested for further management of altered mental status, Benadryl and Dilantin overdose unintentional  Review of Systems:  CONSTITUTIONAL: Positive altered mental status.  No fever/chills, fatigue, weakness, weight gain/loss, headache. EYES: No blurry or double vision. ENT: No tinnitus, postnasal drip, redness or soreness of the oropharynx. RESPIRATORY: No cough, dyspnea, wheeze.  No hemoptysis.  CARDIOVASCULAR: No chest pain, palpitations, syncope, orthopnea. No lower  extremity edema.  GASTROINTESTINAL: No nausea, vomiting, abdominal pain, diarrhea, constipation.  No hematemesis, melena or hematochezia. GENITOURINARY: No dysuria, frequency, hematuria. ENDOCRINE: No polyuria or nocturia. No heat or cold intolerance. HEMATOLOGY: No anemia, bruising, bleeding. INTEGUMENTARY: No rashes, ulcers, lesions. MUSCULOSKELETAL: No arthritis, gout, dyspnea. NEUROLOGIC: No numbness, tingling, ataxia, seizure-type activity, weakness. PSYCHIATRIC: No anxiety, depression, insomnia.   Past Medical History:  Diagnosis Date  . Alcohol abuse   . Alcohol abuse 04/01/2017  . COPD (chronic obstructive pulmonary disease) (HCC)   . Crack cocaine use   . Depression   . ETOH abuse   . Hypothyroidism   . Seizures (HCC)     Past Surgical History:  Procedure Laterality Date  . BACK SURGERY     cyst removed.     reports that he has been smoking cigarettes. He has a 39.00 pack-year smoking history. His smokeless tobacco use includes chew. He reports previous alcohol use. He reports current drug use. Drugs: Marijuana and Cocaine.  Allergies  Allergen Reactions  . Chocolate Hives  . Vicodin [Hydrocodone-Acetaminophen] Nausea And Vomiting    Family History  Problem Relation Age of Onset  . Alcohol abuse Father   . Seizures Neg Hx     Prior to Admission medications   Medication Sig Start Date End Date Taking? Authorizing Provider  albuterol (VENTOLIN HFA) 108 (90 Base) MCG/ACT inhaler Inhale 2 puffs into the lungs every 4 (four) hours as needed for wheezing or shortness of breath.    [provider]  gabapentin (NEURONTIN) 300 MG capsule Take 1 capsule (300 mg total) by mouth 3 (three) times daily. 10/29/17   McNew, Ileene Hutchinson, MD  hydrOXYzine (ATARAX/VISTARIL) 10 MG tablet Take 10 mg by mouth  at bedtime.    [provider]  levothyroxine (SYNTHROID, LEVOTHROID) 25 MCG tablet Take 1 tablet (25 mcg total) by mouth daily before breakfast. 10/29/17   McNew,  Ileene Hutchinson, MD  lisinopril (ZESTRIL) 20 MG tablet Take 1 tablet (20 mg total) by mouth daily. 07/02/18   Mayo, Allyn Kenner, MD  naltrexone (DEPADE) 50 MG tablet Take 50 mg by mouth daily.    [provider]  phenytoin (DILANTIN) 100 MG ER capsule Take 1 capsule (100 mg total) by mouth 3 (three) times daily. Patient taking differently: Take 100 mg by mouth 4 (four) times daily.  01/23/18 10/29/18  Ihor Austin, MD  simvastatin (ZOCOR) 20 MG tablet Take 20 mg by mouth daily.    [provider]  Tiotropium Bromide-Olodaterol (STIOLTO RESPIMAT) 2.5-2.5 MCG/ACT AERS Inhale 2 puffs into the lungs daily.    [provider]    Physical Exam: Vitals:   08/29/19 1930 08/29/19 1932 08/29/19 1944  BP: (!) 146/90  (!) 146/90  Pulse: (!) 59  (!) 59  Resp: 12    Temp:  98.6 F (37 C)   TempSrc:  Oral   SpO2: 99%      GENERAL: 54 y.o.-year-old male patient, well-developed, well-nourished lying in the bed in no acute distress.   HEENT: Head atraumatic, normocephalic. Pupils equal. Mucus membranes moist. NECK: Supple. No JVD. CHEST: Normal breath sounds bilaterally. No wheezing, rales, rhonchi or crackles. No use of accessory muscles of respiration.  No reproducible chest wall tenderness.  CARDIOVASCULAR: S1, S2 normal. No murmurs, rubs, or gallops. Cap refill <2 seconds. Pulses intact distally.  ABDOMEN: Soft, nondistended, nontender. No rebound, guarding, rigidity. Normoactive bowel sounds present in all four quadrants.  EXTREMITIES: No pedal edema, cyanosis, or clubbing. No calf tenderness or Homan's sign.  NEUROLOGIC: The patient is alert and oriented x 3. Cranial nerves II through XII are grossly intact with no focal sensorimotor deficit. PSYCHIATRIC:  Strange affect,  Tangential thought content. Slow to respond.   SKIN: Warm, dry, and intact without obvious rash, lesion, or ulcer.    Labs on Admission:  CBC: Recent Labs  Lab 08/29/19 1920  WBC 9.5  HGB 16.1  HCT 48.1   MCV 89.4  PLT 195   Basic Metabolic Panel: Recent Labs  Lab 08/29/19 1920  NA 143  K 4.2  CL 107  CO2 28  GLUCOSE 97  BUN 14  CREATININE 0.79  CALCIUM 9.4   GFR: CrCl cannot be calculated (Unknown ideal weight.). Liver Function Tests: Recent Labs  Lab 08/29/19 1920  AST 21  ALT 15  ALKPHOS 62  BILITOT 0.7  PROT 8.0  ALBUMIN 5.0   No results for input(s): LIPASE, AMYLASE in the last 168 hours. No results for input(s): AMMONIA in the last 168 hours. Coagulation Profile: No results for input(s): INR, PROTIME in the last 168 hours. Cardiac Enzymes: No results for input(s): CKTOTAL, CKMB, CKMBINDEX, TROPONINI in the last 168 hours. BNP (last 3 results) No results for input(s): PROBNP in the last 8760 hours. HbA1C: No results for input(s): HGBA1C in the last 72 hours. CBG: No results for input(s): GLUCAP in the last 168 hours. Lipid Profile: No results for input(s): CHOL, HDL, LDLCALC, TRIG, CHOLHDL, LDLDIRECT in the last 72 hours. Thyroid Function Tests: No results for input(s): TSH, T4TOTAL, FREET4, T3FREE, THYROIDAB in the last 72 hours. Anemia Panel: No results for input(s): VITAMINB12, FOLATE, FERRITIN, TIBC, IRON, RETICCTPCT in the last 72 hours. Urine analysis:    Component  Value Date/Time   COLORURINE STRAW (A) 08/29/2019 1924   APPEARANCEUR CLEAR (A) 08/29/2019 1924   LABSPEC 1.006 08/29/2019 1924   PHURINE 7.0 08/29/2019 1924   GLUCOSEU NEGATIVE 08/29/2019 1924   HGBUR SMALL (A) 08/29/2019 1924   BILIRUBINUR NEGATIVE 08/29/2019 1924   KETONESUR NEGATIVE 08/29/2019 1924   PROTEINUR NEGATIVE 08/29/2019 1924   NITRITE NEGATIVE 08/29/2019 1924   LEUKOCYTESUR NEGATIVE 08/29/2019 1924   Sepsis Labs: @LABRCNTIP (procalcitonin:4,lacticidven:4) )No results found for this or any previous visit (from the past 240 hour(s)).   Radiological Exams on Admission: No results found.  EKG: Normal sinus rhythm at 65 bpm with rightward axis and nonspecific ST-T  wave changes.   Assessment/Plan  This is a 54 y.o. male with a history of seizure disorder, EtOH use disorder, hypertension, hypothyroidism depression, COPD now being admitted with:  #.  Unintentional Dilantin overdose -Admit observation -Serial Dilantin levels, hold Atarax and Dilantin -Telemetry monitoring and continuous pulse ox -Neurochecks -IV fluids -Seizure precautions  #.  History of seizure disorder -Continue Neurontin  #.  History of EtOH use disorder -CIWA protocol -Oral vitamins -Fall and aspiration precaution  #.  History of COPD -Continue O2 and inhalers  #. History of hypothyroidism - Continue Synthroid  #. History of hypertension - Continue lisinopril  #. History of hyperlipidemia - Continue Zocor  Admission status: Patient, telemetry IV Fluids: Normal saline Diet/Nutrition: Heart healthy Consults called: None DVT Px: Early ambulation. Code Status: Full Code  Disposition Plan: To home in 1-2 days  All the records are reviewed and case discussed with ED provider. Management plans discussed with the patient and/or family who express understanding and agree with plan of care.  Jayveion Stalling D.O. on 08/29/2019 at 10:16 PM CC: Primary care physician; 10/30/2019, MD   08/29/2019, 10:16 PM

## 2019-08-29 NOTE — ED Provider Notes (Signed)
[facility] Emergency Department Provider Note   ____________________________________________   First MD Initiated Contact with Patient 08/29/19 1912     (approximate)  I have reviewed the triage vital signs and the nursing notes.   HISTORY  Chief Complaint Drug Overdose    HPI Rodney Watson is a 54 y.o. male with past medical history of alcohol abuse, hypertension, seizures, depressive disorder, and COPD who presents to the ED for possible overdose.  [facility] at the ministry on [address] and staff there found him less responsive than usual.  They were concerned he could have had a seizure, but there was no witnessed seizure activity.  EMS was called and by the time they arrived, patient was alert and oriented.  He now admits that he took 9 pills of 100 mg Benadryl and drank 2 beers earlier in the day.  He estimates he took the medication around 1 PM, denies taking any other extra medications or recreational drugs.  He states he has been taking his seizure medicines as prescribed.  He denies any intent to harm himself by taking the Benadryl, states "I was just try to get high."        Past Medical History:  Diagnosis Date  . Alcohol abuse   . Alcohol abuse 04/01/2017  . COPD (chronic obstructive pulmonary disease) (HCC)   . Crack cocaine use   . Depression   . ETOH abuse   . Hypothyroidism   . Seizures [facility])     Patient Active Problem List   Diagnosis Date Noted  . Medication overdose   . Seizures (HCC) 06/30/2018  . Severe recurrent major depression without psychotic features (HCC) 10/25/2017  . Suicide attempt (HCC) 10/24/2017  . Major depressive disorder, recurrent episode, severe (HCC) 09/29/2017  . Alcohol withdrawal (HCC) 07/18/2017  . Seizure disorder (HCC) 07/18/2017  . Essential hypertension 07/18/2017  . Alcohol abuse with alcohol-induced mood disorder (HCC) 04/10/2017    Past Surgical History:    Procedure Laterality Date  . BACK SURGERY     cyst removed.    Prior to Admission medications   Medication Sig Start Date End Date Taking? Authorizing Provider  albuterol (VENTOLIN HFA) 108 (90 Base) MCG/ACT inhaler Inhale 2 puffs into the lungs every 4 (four) hours as needed for wheezing or shortness of breath.    [provider]  gabapentin (NEURONTIN) 300 MG capsule Take 1 capsule (300 mg total) by mouth 3 (three) times daily. 10/29/17   McNew, Ileene Hutchinson, MD  hydrOXYzine (ATARAX/VISTARIL) 10 MG tablet Take 10 mg by mouth at bedtime.    [provider]  levothyroxine (SYNTHROID, LEVOTHROID) 25 MCG tablet Take 1 tablet (25 mcg total) by mouth daily before breakfast. 10/29/17   McNew, Ileene Hutchinson, MD  lisinopril (ZESTRIL) 20 MG tablet Take 1 tablet (20 mg total) by mouth daily. 07/02/18   Mayo, Allyn Kenner, MD  naltrexone (DEPADE) 50 MG tablet Take 50 mg by mouth daily.    [provider]  phenytoin (DILANTIN) 100 MG ER capsule Take 1 capsule (100 mg total) by mouth 3 (three) times daily. Patient taking differently: Take 100 mg by mouth 4 (four) times daily.  01/23/18 10/29/18  Ihor Austin, MD  simvastatin (ZOCOR) 20 MG tablet Take 20 mg by mouth daily.    [provider]  Tiotropium Bromide-Olodaterol (STIOLTO RESPIMAT) 2.5-2.5 MCG/ACT AERS Inhale 2 puffs into the lungs daily.    [provider]    Allergies Chocolate  and Vicodin [hydrocodone-acetaminophen]  Family History  Problem Relation Age of Onset  . Alcohol abuse Father   . Seizures Neg Hx     Social History Social History   Tobacco Use  . Smoking status: Current Every Day Smoker    Packs/day: 1.00    Years: 39.00    Pack years: 39.00    Types: Cigarettes  . Smokeless tobacco: Current User    Types: Chew  Vaping Use  . Vaping Use: Never used  Substance Use Topics  . Alcohol use: Not Currently    Comment: 10/30/2017  . Drug use: Yes    Types: Marijuana, Cocaine    Comment: 1-2  joints/week    Review of Systems  Constitutional: No fever/chills.  Positive for overdose. Eyes: No visual changes. ENT: No sore throat. Cardiovascular: Denies chest pain. Respiratory: Denies shortness of breath. Gastrointestinal: No abdominal pain.  No nausea, no vomiting.  No diarrhea.  No constipation. Genitourinary: Negative for dysuria. Musculoskeletal: Negative for back pain. Skin: Negative for rash. Neurological: Negative for headaches, focal weakness or numbness.  ____________________________________________   PHYSICAL EXAM:  VITAL SIGNS: ED Triage Vitals  Enc Vitals Group     BP      Pulse      Resp      Temp      Temp src      SpO2      Weight      Height      Head Circumference      Peak Flow      Pain Score      Pain Loc      Pain Edu?      Excl. in GC?     Constitutional: Alert and oriented to person, place, time, and situation. Eyes: Conjunctivae are normal.  Pupils equal round and reactive to light bilaterally.  Horizontal nystagmus noted. Head: Atraumatic. Nose: No congestion/rhinnorhea. Mouth/Throat: Mucous membranes are moist. Neck: Normal ROM Cardiovascular: Normal rate, regular rhythm. Grossly normal heart sounds. Respiratory: Normal respiratory effort.  No retractions. Lungs CTAB. Gastrointestinal: Soft and nontender. No distention. Genitourinary: deferred Musculoskeletal: No lower extremity tenderness nor edema. Neurologic:  Normal speech and language. No gross focal neurologic deficits are appreciated. Skin:  Skin is warm, dry and intact. No rash noted. Psychiatric: Mood and affect are normal. Speech and behavior are normal.  ____________________________________________   LABS (all labs ordered are listed, but only abnormal results are displayed)  Labs Reviewed  URINE DRUG SCREEN, QUALITATIVE ([facility] ONLY) - Abnormal; Notable for the following components:      Result Value   Cannabinoid 50 Ng, Ur [place] POSITIVE (*)    All other  components within normal limits  ACETAMINOPHEN LEVEL - Abnormal; Notable for the following components:   Acetaminophen (Tylenol), Serum <10 (*)    All other components within normal limits  SALICYLATE LEVEL - Abnormal; Notable for the following components:   Salicylate Lvl <7.0 (*)    All other components within normal limits  URINALYSIS, COMPLETE (UACMP) WITH MICROSCOPIC - Abnormal; Notable for the following components:   Color, Urine STRAW (*)    APPearance CLEAR (*)    Hgb urine dipstick SMALL (*)    All other components within normal limits  PHENYTOIN LEVEL, TOTAL - Abnormal; Notable for the following components:   Phenytoin Lvl 44.3 (*)    All other components within normal limits  SARS CORONAVIRUS 2 BY RT PCR (HOSPITAL ORDER, PERFORMED IN [facility])  COMPREHENSIVE METABOLIC PANEL  ETHANOL  CBC   ____________________________________________  EKG  ED ECG REPORT I, Chesley Noon, the attending physician, personally viewed and interpreted this ECG.   Date: 08/29/2019  EKG Time: 19:30  Rate: 65  Rhythm: normal sinus rhythm  Axis: RAD  Intervals:none  ST&T Change: None   PROCEDURES  Procedure(s) performed (including Critical Care):  Procedures   ____________________________________________   INITIAL IMPRESSION / ASSESSMENT AND PLAN / ED COURSE       54 year old male with past medical history of seizures, hypertension, alcohol abuse, depression, and COPD who presents to the ED after being found [facility]nolent at the [facility].  Patient admits to overdosing on Benadryl and states his intention was to get high and not to harm himself.  He is alert and oriented here, states he has been taking his other medications as prescribed.  We will check Tylenol and salicylate levels, as well as his Dilantin level, which she takes for seizures.  Benadryl overdose could have some anticholinergic effect, will check EKG for any widening of QRS.   EKG is  unremarkable, no widening of QRS or prolonged QTC.  Lab work remarkable for elevated Dilantin level, which would explain the unsteadiness on his feet and the horizontal nystagmus.  Given he is unable to walk at this point, case was discussed with hospitalist for admission.      ____________________________________________   FINAL CLINICAL IMPRESSION(S) / ED DIAGNOSES  Final diagnoses:  Accidental phenytoin poisoning, initial encounter     ED Discharge Orders    None       Note:  This document was prepared using Dragon voice recognition software and may include unintentional dictation errors.   Chesley Noon, MD 08/29/19 2218

## 2019-08-29 NOTE — ED Notes (Signed)
Pt states there is a bug on his finger- reassured pt that there was not a bug on his finger- pt states he does not want staff to think he is crazy

## 2019-08-29 NOTE — ED Notes (Signed)
Pt refuses cardiac monitor 

## 2019-08-29 NOTE — ED Notes (Signed)
Pt wakes up to light touch or speech. Pt is oriented to self and situation only. Speech sounds slurred but is comprehensible. Pt continuously removed monitoring devices. Pt given urinal and instructed on use. Pt instructed on use of call bell, pt verbalizes understanding.

## 2019-08-29 NOTE — ED Triage Notes (Signed)
[facility] EMS from a [facility] [address]- pt denies drinking alcohol but states he has been taking benadryl- EMS was called out for possible seizure- pt has a hx of epilepsy- pt denies seizure activity

## 2019-08-29 NOTE — ED Notes (Signed)
Pt states he took 9 100mg  benadryl and drank 2 beers

## 2019-08-29 NOTE — ED Notes (Signed)
Pt instructed to use call bell if assistance is needed- pt states he will "do anything to help"

## 2019-08-30 DIAGNOSIS — E785 Hyperlipidemia, unspecified: Secondary | ICD-10-CM | POA: Diagnosis present

## 2019-08-30 DIAGNOSIS — Z79890 Hormone replacement therapy (postmenopausal): Secondary | ICD-10-CM | POA: Diagnosis not present

## 2019-08-30 DIAGNOSIS — Z811 Family history of alcohol abuse and dependence: Secondary | ICD-10-CM | POA: Diagnosis not present

## 2019-08-30 DIAGNOSIS — F1010 Alcohol abuse, uncomplicated: Secondary | ICD-10-CM

## 2019-08-30 DIAGNOSIS — G9341 Metabolic encephalopathy: Secondary | ICD-10-CM | POA: Diagnosis not present

## 2019-08-30 DIAGNOSIS — T420X1D Poisoning by hydantoin derivatives, accidental (unintentional), subsequent encounter: Secondary | ICD-10-CM

## 2019-08-30 DIAGNOSIS — R4182 Altered mental status, unspecified: Secondary | ICD-10-CM | POA: Diagnosis not present

## 2019-08-30 DIAGNOSIS — Z91018 Allergy to other foods: Secondary | ICD-10-CM | POA: Diagnosis not present

## 2019-08-30 DIAGNOSIS — G92 Toxic encephalopathy: Secondary | ICD-10-CM | POA: Diagnosis present

## 2019-08-30 DIAGNOSIS — Z885 Allergy status to narcotic agent status: Secondary | ICD-10-CM | POA: Diagnosis not present

## 2019-08-30 DIAGNOSIS — Z79899 Other long term (current) drug therapy: Secondary | ICD-10-CM | POA: Diagnosis not present

## 2019-08-30 DIAGNOSIS — I10 Essential (primary) hypertension: Secondary | ICD-10-CM | POA: Diagnosis present

## 2019-08-30 DIAGNOSIS — F4310 Post-traumatic stress disorder, unspecified: Secondary | ICD-10-CM | POA: Diagnosis present

## 2019-08-30 DIAGNOSIS — F419 Anxiety disorder, unspecified: Secondary | ICD-10-CM | POA: Diagnosis not present

## 2019-08-30 DIAGNOSIS — E039 Hypothyroidism, unspecified: Secondary | ICD-10-CM | POA: Diagnosis present

## 2019-08-30 DIAGNOSIS — T450X1A Poisoning by antiallergic and antiemetic drugs, accidental (unintentional), initial encounter: Secondary | ICD-10-CM | POA: Diagnosis present

## 2019-08-30 DIAGNOSIS — F17210 Nicotine dependence, cigarettes, uncomplicated: Secondary | ICD-10-CM | POA: Diagnosis present

## 2019-08-30 DIAGNOSIS — J449 Chronic obstructive pulmonary disease, unspecified: Secondary | ICD-10-CM | POA: Diagnosis present

## 2019-08-30 DIAGNOSIS — T420X1A Poisoning by hydantoin derivatives, accidental (unintentional), initial encounter: Secondary | ICD-10-CM | POA: Diagnosis present

## 2019-08-30 DIAGNOSIS — G40909 Epilepsy, unspecified, not intractable, without status epilepticus: Secondary | ICD-10-CM | POA: Diagnosis present

## 2019-08-30 DIAGNOSIS — T450X5A Adverse effect of antiallergic and antiemetic drugs, initial encounter: Secondary | ICD-10-CM | POA: Diagnosis present

## 2019-08-30 DIAGNOSIS — Z20822 Contact with and (suspected) exposure to covid-19: Secondary | ICD-10-CM | POA: Diagnosis present

## 2019-08-30 DIAGNOSIS — F319 Bipolar disorder, unspecified: Secondary | ICD-10-CM | POA: Diagnosis present

## 2019-08-30 DIAGNOSIS — Z23 Encounter for immunization: Secondary | ICD-10-CM | POA: Diagnosis not present

## 2019-08-30 LAB — CBC
HCT: 43.9 % (ref 39.0–52.0)
Hemoglobin: 14.6 g/dL (ref 13.0–17.0)
MCH: 30.2 pg (ref 26.0–34.0)
MCHC: 33.3 g/dL (ref 30.0–36.0)
MCV: 90.9 fL (ref 80.0–100.0)
Platelets: 185 10*3/uL (ref 150–400)
RBC: 4.83 MIL/uL (ref 4.22–5.81)
RDW: 14.9 % (ref 11.5–15.5)
WBC: 5.9 10*3/uL (ref 4.0–10.5)
nRBC: 0 % (ref 0.0–0.2)

## 2019-08-30 LAB — COMPREHENSIVE METABOLIC PANEL
ALT: 12 U/L (ref 0–44)
AST: 18 U/L (ref 15–41)
Albumin: 3.9 g/dL (ref 3.5–5.0)
Alkaline Phosphatase: 52 U/L (ref 38–126)
Anion gap: 6 (ref 5–15)
BUN: 8 mg/dL (ref 6–20)
CO2: 27 mmol/L (ref 22–32)
Calcium: 8.4 mg/dL — ABNORMAL LOW (ref 8.9–10.3)
Chloride: 108 mmol/L (ref 98–111)
Creatinine, Ser: 0.69 mg/dL (ref 0.61–1.24)
GFR calc Af Amer: >60 mL/min (ref >60)
GFR calc non Af Amer: >60 mL/min (ref >60)
Glucose, Bld: 88 mg/dL (ref 70–99)
Potassium: 3.8 mmol/L (ref 3.5–5.1)
Sodium: 141 mmol/L (ref 135–145)
Total Bilirubin: 0.7 mg/dL (ref 0.3–1.2)
Total Protein: 6.4 g/dL — ABNORMAL LOW (ref 6.5–8.1)

## 2019-08-30 LAB — MAGNESIUM: Magnesium: 1.9 mg/dL (ref 1.7–2.4)

## 2019-08-30 LAB — TSH: TSH: 4.539 u[IU]/mL — ABNORMAL HIGH (ref 0.350–4.500)

## 2019-08-30 LAB — PHOSPHORUS: Phosphorus: 3.6 mg/dL (ref 2.5–4.6)

## 2019-08-30 LAB — HIV ANTIBODY (ROUTINE TESTING W REFLEX): HIV Screen 4th Generation wRfx: NONREACTIVE

## 2019-08-30 LAB — PHENYTOIN LEVEL, TOTAL: Phenytoin Lvl: 33.4 ug/mL (ref 10.0–20.0)

## 2019-08-30 MED ORDER — FOLIC ACID 1 MG PO TABS
1.0000 mg | ORAL_TABLET | Freq: Every day | ORAL | Status: DC
  Administered 2019-08-30 – 2019-09-01 (×3): 1 mg via ORAL
  Filled 2019-08-30 (×3): qty 1

## 2019-08-30 MED ORDER — SENNOSIDES-DOCUSATE SODIUM 8.6-50 MG PO TABS: 1.0000 | ORAL_TABLET | Freq: Every evening | ORAL | Status: DC | PRN

## 2019-08-30 MED ORDER — LORAZEPAM 1 MG PO TABS
1.0000 mg | ORAL_TABLET | ORAL | Status: DC | PRN
  Administered 2019-08-31: 2 mg via ORAL
  Filled 2019-08-30: qty 1
  Filled 2019-08-30: qty 2

## 2019-08-30 MED ORDER — ONDANSETRON HCL 4 MG PO TABS: 4.0000 mg | ORAL_TABLET | Freq: Four times a day (QID) | ORAL | Status: DC | PRN

## 2019-08-30 MED ORDER — NALTREXONE HCL 50 MG PO TABS
50.0000 mg | ORAL_TABLET | Freq: Every day | ORAL | Status: DC
  Administered 2019-08-30 – 2019-09-01 (×3): 50 mg via ORAL
  Filled 2019-08-30 (×3): qty 1

## 2019-08-30 MED ORDER — THIAMINE HCL 100 MG/ML IJ SOLN: 100.0000 mg | Freq: Every day | INTRAMUSCULAR | Status: DC

## 2019-08-30 MED ORDER — PNEUMOCOCCAL VAC POLYVALENT 25 MCG/0.5ML IJ INJ
0.5000 mL | INJECTION | INTRAMUSCULAR | Status: AC
  Administered 2019-08-31: 0.5 mL via INTRAMUSCULAR
  Filled 2019-08-30: qty 0.5

## 2019-08-30 MED ORDER — SODIUM CHLORIDE 0.9 % IV SOLN: INTRAVENOUS | Status: DC

## 2019-08-30 MED ORDER — IPRATROPIUM BROMIDE 0.02 % IN SOLN: 0.5000 mg | Freq: Four times a day (QID) | RESPIRATORY_TRACT | Status: DC | PRN

## 2019-08-30 MED ORDER — SIMVASTATIN 20 MG PO TABS
20.0000 mg | ORAL_TABLET | Freq: Every day | ORAL | Status: DC
  Administered 2019-08-30 – 2019-09-01 (×3): 20 mg via ORAL
  Filled 2019-08-30 (×3): qty 1

## 2019-08-30 MED ORDER — ALBUTEROL SULFATE (2.5 MG/3ML) 0.083% IN NEBU: 3.0000 mL | INHALATION_SOLUTION | RESPIRATORY_TRACT | Status: DC | PRN

## 2019-08-30 MED ORDER — LORAZEPAM 2 MG/ML IJ SOLN
1.0000 mg | INTRAMUSCULAR | Status: DC | PRN
  Administered 2019-08-30: 2 mg via INTRAVENOUS
  Administered 2019-08-30: 3 mg via INTRAVENOUS
  Filled 2019-08-30: qty 1
  Filled 2019-08-30: qty 2

## 2019-08-30 MED ORDER — ADULT MULTIVITAMIN W/MINERALS CH
1.0000 | ORAL_TABLET | Freq: Every day | ORAL | Status: DC
  Administered 2019-08-30 – 2019-09-01 (×3): 1 via ORAL
  Filled 2019-08-30 (×3): qty 1

## 2019-08-30 MED ORDER — GABAPENTIN 300 MG PO CAPS
300.0000 mg | ORAL_CAPSULE | Freq: Three times a day (TID) | ORAL | Status: DC
  Administered 2019-08-30 – 2019-09-01 (×8): 300 mg via ORAL
  Filled 2019-08-30 (×8): qty 1

## 2019-08-30 MED ORDER — BISACODYL 5 MG PO TBEC: 5.0000 mg | DELAYED_RELEASE_TABLET | Freq: Every day | ORAL | Status: DC | PRN

## 2019-08-30 MED ORDER — LISINOPRIL 20 MG PO TABS
20.0000 mg | ORAL_TABLET | Freq: Every day | ORAL | Status: DC
  Administered 2019-08-30 – 2019-09-01 (×3): 20 mg via ORAL
  Filled 2019-08-30 (×3): qty 1

## 2019-08-30 MED ORDER — THIAMINE HCL 100 MG PO TABS
100.0000 mg | ORAL_TABLET | Freq: Every day | ORAL | Status: DC
  Administered 2019-08-30 – 2019-09-01 (×3): 100 mg via ORAL
  Filled 2019-08-30 (×3): qty 1

## 2019-08-30 MED ORDER — ONDANSETRON HCL 4 MG/2ML IJ SOLN
4.0000 mg | Freq: Four times a day (QID) | INTRAMUSCULAR | Status: DC | PRN
  Administered 2019-08-30: 4 mg via INTRAVENOUS
  Filled 2019-08-30: qty 2

## 2019-08-30 MED ORDER — LEVOTHYROXINE SODIUM 50 MCG PO TABS
25.0000 ug | ORAL_TABLET | Freq: Every day | ORAL | Status: DC
  Administered 2019-08-30 – 2019-09-01 (×3): 25 ug via ORAL
  Filled 2019-08-30 (×3): qty 1

## 2019-08-30 NOTE — Progress Notes (Signed)
[facility]  -  at [facility]   PATIENT NAME: Rodney Watson    MR#:  [id number]  DATE OF BIRTH:  Oct 08, 1965  SUBJECTIVE:  feels better. She is irritated with the telemonitoring beeping once again great affect. Eating well. Wants to go home.  Came in after he took several pills of Benadryl "to get high"  REVIEW OF SYSTEMS:   Review of Systems  Constitutional: Negative for chills, fever and weight loss.  HENT: Negative for ear discharge, ear pain and nosebleeds.   Eyes: Negative for blurred vision, pain and discharge.  Respiratory: Negative for sputum production, shortness of breath, wheezing and stridor.   Cardiovascular: Negative for chest pain, palpitations, orthopnea and PND.  Gastrointestinal: Negative for abdominal pain, diarrhea, nausea and vomiting.  Genitourinary: Negative for frequency and urgency.  Musculoskeletal: Negative for back pain and joint pain.  Neurological: Positive for weakness. Negative for sensory change, speech change and focal weakness.  Psychiatric/Behavioral: Negative for depression and hallucinations. The patient is nervous/anxious.    Tolerating Diet:yes Tolerating PT: ambulatory  DRUG ALLERGIES:   Allergies  Allergen Reactions   Chocolate Hives   Vicodin [Hydrocodone-Acetaminophen] Nausea And Vomiting    VITALS:  Blood pressure 123/76, pulse 66, temperature 98.3 F (36.8 C), resp. rate 14, weight 56.1 kg, SpO2 98 %.  PHYSICAL EXAMINATION:   Physical Exam  GENERAL:  53 y.o.-year-old patient lying in the bed with no acute distress.  EYES: Pupils equal, round, reactive to light and accommodation. No scleral icterus.   HEENT: Head atraumatic, normocephalic. Oropharynx and nasopharynx clear.  NECK:  Supple, no jugular venous distention. No thyroid enlargement, no tenderness.  LUNGS: Normal breath sounds bilaterally, no wheezing, rales, rhonchi. No use of accessory muscles of respiration.  CARDIOVASCULAR: S1, S2 normal.  No murmurs, rubs, or gallops.  ABDOMEN: Soft, nontender, nondistended. Bowel sounds present. No organomegaly or mass.  EXTREMITIES: No cyanosis, clubbing or edema b/l.    NEUROLOGIC: Cranial nerves II through XII are intact. No focal Motor or sensory deficits b/l.   PSYCHIATRIC:  patient is alert and oriented x2, little anxious SKIN: No obvious rash, lesion, or ulcer.   LABORATORY PANEL:  CBC Recent Labs  Lab 08/30/19 0651  WBC 5.9  HGB 14.6  HCT 43.9  PLT 185    Chemistries  Recent Labs  Lab 08/30/19 0651  NA 141  K 3.8  CL 108  CO2 27  GLUCOSE 88  BUN 8  CREATININE 0.69  CALCIUM 8.4*  MG 1.9  AST 18  ALT 12  ALKPHOS 52  BILITOT 0.7   Cardiac Enzymes No results for input(s): TROPONINI in the last 168 hours. RADIOLOGY:  No results found. ASSESSMENT AND PLAN:  Rodney Watson is a 54 y.o. male with a known history of seizure disorder, EtOH use disorder, hypertension, hypothyroidism depression, COPD presents to the emergency department for evaluation of altered mental status, questionable overdose.  Patient was in a usual state of health until earlier today he was found altered and less responsive, outside of the [facility].  patient states that he took 9 tabs of 100 mg of Benadryl and drank 2 beers.  #.Acute metabolic encephalopathy due to ? Dilantin overdose/ Benadryl overdose  -per patient he has done this in the past "to get high" -Serial Dilantin 44--30 -- hold Atarax and Dilantin - received IV fluids -Seizure precautions -NSR on telemonitor--d/c -denies SI however given multiple ER and admission at [facility] for similar reason consult psych--Dr  rao notified  #.  History of seizure disorder -Continue Neurontin  #.  History of EtOH use disorder -CIWA protocol -Oral vitamins -Fall and aspiration precaution  #.  History of COPD -Continue O2 and inhalers  #. History of hypothyroidism - Continue Synthroid  #. History of hypertension -  Continue lisinopril  #. History of hyperlipidemia - Continue Zocor  Admission status: Patient, telemetry Diet/Nutrition: Heart healthy Consults called: None DVT Px: Early ambulation. Code Status: Full Code  Disposition Plan: To home in 1-2 days TOC for d/c planning  Status is: inpatient  Dispo:              Anticipated d/c date is: 1-2 days              Patient currently is not medically stable to d/c.given elevated dilantin levels, psych consult        TOTAL TIME TAKING CARE OF THIS PATIENT: 25 minutes.  >50% time spent on counselling and coordination of care  Note: This dictation was prepared with Dragon dictation along with smaller phrase technology. Any transcriptional errors that result from this process are unintentional.  Enedina Finner M.D    [facility]   CC: Primary care physician; Evelene Croon, MDPatient ID: Rodney Watson, male   DOB: [birth year], 54 y.o.   MRN: [id number]

## 2019-08-30 NOTE — Progress Notes (Signed)
Poison control called regarding patient's condition.  Will follow up in the morning. Rodney Watson

## 2019-08-30 NOTE — Plan of Care (Signed)
Patient is alert and oriented but quite anxious. Gave 1 mg of ativan to reduce anxiety and promote sleep. Patient is also nauseous so zofran was given as well. Will continue to monitor. [name]  08/30/2019

## 2019-08-30 NOTE — Plan of Care (Signed)
Continuing with plan of care. 

## 2019-08-31 LAB — PHENYTOIN LEVEL, TOTAL: Phenytoin Lvl: 32.9 ug/mL (ref 10.0–20.0)

## 2019-08-31 MED ORDER — ENSURE ENLIVE PO LIQD
237.0000 mL | Freq: Three times a day (TID) | ORAL | Status: DC
  Administered 2019-08-31: 237 mL via ORAL

## 2019-08-31 NOTE — Consult Note (Addendum)
[facility] Face-to-Face Psychiatry Consult   Reason for Consult:   Eval for suicidality vs Overdose accidental or for high feeling  Referring Physician:  Enedina Finner MD  Patient Identification: Rodney Watson MRN:  [id number] Principal Psychiatric Diagnosis: <principal problem not specified> Medical Diagnosis:     UDS was positive for marijuana   Total Time spent with patient: 30 min    SUBJECTIVE  Chief Complaint:  I am not suicidal this was an accident --this time  I contract for safety   Rodney Watson is a 54 y.o. male patient admitted with dilantin and vistaril excess ---  Now medically clearing and Consulted mainly on safety matters   Blood pressure (!) 159/94, pulse 67, temperature 98.7 F (37.1 C), temperature source Oral, resp. rate 18, height 5\' 8"  (1.727 m), weight 56.1 kg, SpO2 98 %.Body mass index is 18.79 kg/m.  HPI:   Long history of ETOH dependence, PTSD and bipolar disorder   He feels bipolar symptoms are not active now but he has elements of excess worry, nervousness tension frustration somatic and autonomic symptoms fear fright dread and doom panic and overall at times anxiety   He feels he can be an outpatient and will resume clinic for psych   Stopped meds on year ago, felt okay on Seroquel in the past   He wants to wait on meds for outpatient  He awaits social work to help connect to Psych clinic  He recognizes dual diagnosis but says ETOH is not a large matter now   Feels happy working and 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   He has no current other substance problems  Denies frank bipolar or psychosis now    He says he felt some kind of aura coming on and in that confusion took dilantin and vistaril extra but it was not a suicide attempt he is happy with his work and church friends  Prior Inpatient Therapy:  1 year ago for bipolar issues and SI without active plan   Given Seroquel but he stopped and  feels he can go to outpatient to resume and recheck what he needs   Prior Outpatient Therapy Visits:  none recently but attends church groups   Previous Psychiatric Medications:   Current Facility-Administered Medications  Medication Dose Route Frequency Provider Last Rate Last Admin  . albuterol (PROVENTIL) (2.5 MG/3ML) 0.083% nebulizer solution 3 mL  3 mL Inhalation Q4H PRN Hugelmeyer, Alexis, DO      . bisacodyl (DULCOLAX) EC tablet 5 mg  5 mg Oral Daily PRN Hugelmeyer, Alexis, DO      . feeding supplement (ENSURE ENLIVE) (ENSURE ENLIVE) liquid 237 mL  237 mL Oral TID BM , MD      . folic acid (FOLVITE) tablet 1 mg  1 mg Oral Daily Hugelmeyer, Alexis, DO   1 mg at 08/31/19 0937  . gabapentin (NEURONTIN) capsule 300 mg  300 mg Oral TID Hugelmeyer, Alexis, DO   300 mg at 08/31/19 1734  . ipratropium (ATROVENT) nebulizer solution 0.5 mg  0.5 mg Nebulization Q6H PRN Hugelmeyer, Alexis, DO      . levothyroxine (SYNTHROID) tablet 25 mcg  25 mcg Oral QAC breakfast Hugelmeyer, Alexis, DO   25 mcg at 08/31/19 0504  . lisinopril (ZESTRIL) tablet 20 mg  20 mg Oral Daily Hugelmeyer, Alexis, DO   20 mg at 08/31/19 0937  . LORazepam (ATIVAN) tablet 1-4 mg  1-4 mg Oral Q1H PRN Hugelmeyer,  Alexis, DO   2 mg at 08/31/19 0414   Or  . LORazepam (ATIVAN) injection 1-4 mg  1-4 mg Intravenous Q1H PRN Hugelmeyer, Alexis, DO   3 mg at 08/30/19 1629  . multivitamin with minerals tablet 1 tablet  1 tablet Oral Daily Hugelmeyer, Alexis, DO   1 tablet at 08/31/19 0937  . naltrexone (DEPADE) tablet 50 mg  50 mg Oral Daily Hugelmeyer, Alexis, DO   50 mg at 08/31/19 0937  . ondansetron (ZOFRAN) tablet 4 mg  4 mg Oral Q6H PRN Hugelmeyer, Alexis, DO       Or  . ondansetron (ZOFRAN) injection 4 mg  4 mg Intravenous Q6H PRN Hugelmeyer, Alexis, DO   4 mg at 08/30/19 0110  . senna-docusate (Senokot-S) tablet 1 tablet  1 tablet Oral QHS PRN Hugelmeyer, Alexis, DO      . simvastatin (ZOCOR) tablet 20 mg  20 mg Oral  Daily Hugelmeyer, Alexis, DO   20 mg at 08/31/19 0937  . thiamine tablet 100 mg  100 mg Oral Daily Hugelmeyer, Alexis, DO   100 mg at 08/31/19 4098    Past Psychiatric History: at least two or three admits  No active actual Suicide attempts over the years  ETOH worse previously but not so much now he says   Past Medical History:  Past Medical History:  Diagnosis Date  . Alcohol abuse   . Alcohol abuse 04/01/2017  . COPD (chronic obstructive pulmonary disease) (HCC)   . Crack cocaine use   . Depression   . ETOH abuse   . Hypothyroidism   . Seizures (HCC)     Past Surgical History:  Procedure Laterality Date  . BACK SURGERY     cyst removed.    Allergies  Allergen Reactions  . Chocolate Hives  . Chocolate Flavor   . Vicodin [Hydrocodone-Acetaminophen] Nausea And Vomiting    Family Medical and Psychiatric History:   Family History  Problem Relation Age of Onset  . Alcohol abuse Father   . Seizures Neg Hx     Social History:   Social History   Socioeconomic History  . Marital status: Single    Spouse name: Not on file  . Number of children: Not on file  . Years of education: Not on file  . Highest education level: Not on file  Occupational History  . Not on file  Tobacco Use  . Smoking status: Current Every Day Smoker    Packs/day: 1.00    Years: 39.00    Pack years: 39.00    Types: Cigarettes  . Smokeless tobacco: Current User    Types: Chew  Vaping Use  . Vaping Use: Never used  Substance and Sexual Activity  . Alcohol use: Not Currently    Comment: 10/30/2017  . Drug use: Yes    Types: Marijuana, Cocaine    Comment: 1-2 joints/week  . Sexual activity: Not Currently  Other Topics Concern  . Not on file  Social History Narrative   ** Merged History Encounter **       Social Determinants of Health   Financial Resource Strain:   . Difficulty of Paying Living Expenses:   Food Insecurity:   . Worried About Programme researcher, broadcasting/film/video in the Last Year:    . Barista in the Last Year:   Transportation Needs:   . Freight forwarder (Medical):   Marland Kitchen Lack of Transportation (Non-Medical):   Physical Activity:   . Days of Exercise per  Week:   . Minutes of Exercise per Session:   Stress:   . Feeling of Stress :   Social Connections:   . Frequency of Communication with Friends and Family:   . Frequency of Social Gatherings with Friends and Family:   . Attends Religious Services:   . Active Member of Clubs or Organizations:   . Attends BankerClub or Organization Meetings:   Marland Kitchen. Marital Status:       Educational History:    HS  Developmental History:  None significant  Court and legal issues none   Family history --parents with depression  Sibs and cousins with bipolar   Substance/Drug History:  None now, past cocaine and ETOH   Says church is enough not AA and related programming  Social History   Substance and Sexual Activity  Alcohol Use Not Currently   Comment: 10/30/2017    Social History   Substance and Sexual Activity  Drug Use Yes  . Types: Marijuana, Cocaine   Comment: 1-2 joints/week    Single no kids no wife ---happy this way he says    LABS  Results for orders placed or performed during the hospital encounter of 08/29/19 (from the past 48 hour(s))  SARS Coronavirus 2 by RT PCR (hospital order, performed in [facility] hospital lab) Nasopharyngeal Nasopharyngeal Swab     Status: None   Collection Time: 08/29/19 10:39 PM   Specimen: Nasopharyngeal Swab  Result Value Ref Range   SARS Coronavirus 2 NEGATIVE NEGATIVE    Comment: (NOTE) SARS-CoV-2 target nucleic acids are NOT DETECTED.  The SARS-CoV-2 RNA is generally detectable in upper and lower respiratory specimens during the acute phase of infection. The lowest concentration of SARS-CoV-2 viral copies this assay can detect is 250 copies / mL. A negative result does not preclude SARS-CoV-2 infection and should not be used as the sole basis for treatment  or other patient management decisions.  A negative result may occur with improper specimen collection / handling, submission of specimen other than nasopharyngeal swab, presence of viral mutation(s) within the areas targeted by this assay, and inadequate number of viral copies (<250 copies / mL). A negative result must be combined with clinical observations, patient history, and epidemiological information.  Fact Sheet for Patients:   BoilerBrush.com.cyhttps://www.fda.gov/media/136312/download  Fact Sheet for Healthcare Providers: https://pope.com/https://www.fda.gov/media/136313/download  This test is not yet approved or  cleared by the [place] FDA and has been authorized for detection and/or diagnosis of SARS-CoV-2 by FDA under an Emergency Use Authorization (EUA).  This EUA will remain in effect (meaning this test can be used) for the duration of the COVID-19 declaration under Section 564(b)(1) of the Act, 21 U.S.C. section 360bbb-3(b)(1), unless the authorization is terminated or revoked sooner.  Performed at [facility], [address] [place], [place] [address]   HIV Antibody (routine testing w rflx)     Status: None   Collection Time: 08/30/19  6:51 AM  Result Value Ref Range   HIV Screen 4th Generation wRfx Non Reactive Non Reactive    Comment: Performed at [facility], [address] [place], [place] [address]  Magnesium     Status: None   Collection Time: 08/30/19  6:51 AM  Result Value Ref Range   Magnesium 1.9 1.7 - 2.4 mg/dL    Comment: Performed at [facility], [address] [place], [place] [address]  Phosphorus     Status: None   Collection Time: 08/30/19  6:51 AM  Result  Value Ref Range   Phosphorus 3.6 2.5 - 4.6 mg/dL    Comment: Performed at [facility], [address] [place], [place] [address]  Comprehensive metabolic panel     Status: Abnormal   Collection Time: 08/30/19  6:51 AM  Result Value Ref Range   Sodium 141 135 - 145 mmol/L    Potassium 3.8 3.5 - 5.1 mmol/L   Chloride 108 98 - 111 mmol/L   CO2 27 22 - 32 mmol/L   Glucose, Bld 88 70 - 99 mg/dL    Comment: Glucose reference range applies only to samples taken after fasting for at least 8 hours.   BUN 8 6 - 20 mg/dL   Creatinine, Ser 2.03 0.61 - 1.24 mg/dL   Calcium 8.4 (L) 8.9 - 10.3 mg/dL   Total Protein 6.4 (L) 6.5 - 8.1 g/dL   Albumin 3.9 3.5 - 5.0 g/dL   AST 18 15 - 41 U/L   ALT 12 0 - 44 U/L   Alkaline Phosphatase 52 38 - 126 U/L   Total Bilirubin 0.7 0.3 - 1.2 mg/dL   GFR calc non Af Amer >60 >60 mL/min   GFR calc Af Amer >60 >60 mL/min   Anion gap 6 5 - 15    Comment: Performed at [facility], [address] [place], [place] [address]  CBC     Status: None   Collection Time: 08/30/19  6:51 AM  Result Value Ref Range   WBC 5.9 4.0 - 10.5 K/uL   RBC 4.83 4.22 - 5.81 MIL/uL   Hemoglobin 14.6 13.0 - 17.0 g/dL   HCT 16.3 39 - 52 %   MCV 90.9 80.0 - 100.0 fL   MCH 30.2 26.0 - 34.0 pg   MCHC 33.3 30.0 - 36.0 g/dL   RDW 84.5 36.4 - 68.0 %   Platelets 185 150 - 400 K/uL   nRBC 0.0 0.0 - 0.2 %    Comment: Performed at [facility], [address] [place], [place] [address]  TSH     Status: Abnormal   Collection Time: 08/30/19  6:51 AM  Result Value Ref Range   TSH 4.539 (H) 0.350 - 4.500 uIU/mL    Comment: Performed by a 3rd Generation assay with a functional sensitivity of <=0.01 uIU/mL. Performed at [facility], [address] [place], [place] [address]   Phenytoin level, total     Status: Abnormal   Collection Time: 08/30/19  6:51 AM  Result Value Ref Range   Phenytoin Lvl 33.4 (HH) 10.0 - 20.0 ug/mL    Comment: CRITICAL RESULT CALLED TO, READ BACK BY AND VERIFIED WITH KELLY ISDENHOUR ON 08/30/19 AT 0856 BY JAG Performed at [facility], [address] [place], [place] [address]   Phenytoin level, total     Status: Abnormal   Collection Time: 08/31/19  5:59 AM  Result Value Ref Range   Phenytoin  Lvl 32.9 (HH) 10.0 - 20.0 ug/mL    Comment: CRITICAL RESULT CALLED TO, READ BACK BY AND VERIFIED WITH ANNABELLA ROBINSON AT 1030 08/31/19 DAS Performed at [facility], [address] [place], [place] [address]     PHYSICAL EXAM  Physical Exam  SYSTEMS REVIEW  Review of Systems  MENTAL STATUS  General Appearance: haggard. Older than stated age, whizened ruddy complexion   Rapport/Eye Contact:  Normal   Orientation:  Times four okay  Consciousness:  Not clouded or fluctuant  Concentration:  okay  Mood/Affect:  Basically stable for now   Language/Speech:  Components normal   Rate:  Volume:slightly loud   Fluency:  Articulation:  Thought Process:  Basically stable  Thought Content:  No new th emes No frank psychosis or mania    SUICIDAL/HOMICIDALITY  Risk to Self:  none Risk to Others:  none   Contracts for safety   Memory/Recall:  Generally okay through general questions overall for all three   Immediate:  Recent:  Remote:  Fund of Knowledge/Intelligence/Cognition:   Average   Judgement:  Fair  Insight:  Fair  Reliability fair     MOVEMENTS Slight shakes when nervous  Psychomotor Activity:  Slight shakes when nervous  Handedness:    Strength & Muscle Tone: no new change Gait & Station: ambulates Patient leans:   Akathisia: none   AIMS (if indicated):   none   Assets:  Works at [employer]:   Addictive personality compliance  ADL'S:  Normal   Sleep:   variable Appetite:  okay  Formulation:    Dual diagnosis patient with above issues Safe to go home.  Although he needs followup does not need IVC and we can discharge --Minister is okay to pick him up when stable   Needs referral to group treatment, AA NA  Related programming day treatment  And Community mental health  Meds can be started at regular clinic No continuity here for followup if we start anything   Treatment Plan Summary:  discharge home when med  stable At this time [facility] stable  Disposition:  Home to church house  Roselind Messier, MD 08/31/2019 8:03 PM

## 2019-08-31 NOTE — Progress Notes (Signed)
Pt. refused bed alarm although he was being educated on safety guidelines.

## 2019-08-31 NOTE — Progress Notes (Signed)
[facility]  - [place] [facility]   PATIENT NAME: Rodney Watson    MR#:  [id number]  DATE OF BIRTH:  1965-04-04  SUBJECTIVE:  feels better.  Wants to go home.per Staff does not want bed alarm on  Came in after he took several pills of Benadryl "to get high"  Now new complaints today  REVIEW OF SYSTEMS:   Review of Systems  Constitutional: Negative for chills, fever and weight loss.  HENT: Negative for ear discharge, ear pain and nosebleeds.   Eyes: Negative for blurred vision, pain and discharge.  Respiratory: Negative for sputum production, shortness of breath, wheezing and stridor.   Cardiovascular: Negative for chest pain, palpitations, orthopnea and PND.  Gastrointestinal: Negative for abdominal pain, diarrhea, nausea and vomiting.  Genitourinary: Negative for frequency and urgency.  Musculoskeletal: Negative for back pain and joint pain.  Neurological: Positive for weakness. Negative for sensory change, speech change and focal weakness.  Psychiatric/Behavioral: Negative for depression and hallucinations. The patient is nervous/anxious.    Tolerating Diet:yes Tolerating PT: ambulatory  DRUG ALLERGIES:   Allergies  Allergen Reactions  . Chocolate Hives  . Chocolate Flavor   . Vicodin [Hydrocodone-Acetaminophen] Nausea And Vomiting    VITALS:  Blood pressure 127/82, pulse 71, temperature 98.8 F (37.1 C), temperature source Oral, resp. rate 20, height 5\' 8"  (1.727 m), weight 56.1 kg, SpO2 100 %.  PHYSICAL EXAMINATION:   Physical Exam  GENERAL:  53 y.o.-year-old patient lying in the bed with no acute distress.  EYES: Pupils equal, round, reactive to light and accommodation. No scleral icterus.   HEENT: Head atraumatic, normocephalic. Oropharynx and nasopharynx clear.  NECK:  Supple, no jugular venous distention. No thyroid enlargement, no tenderness.  LUNGS: Normal breath sounds bilaterally, no wheezing, rales, rhonchi. No use of accessory  muscles of respiration.  CARDIOVASCULAR: S1, S2 normal. No murmurs, rubs, or gallops.  ABDOMEN: Soft, nontender, nondistended. Bowel sounds present. No organomegaly or mass.  EXTREMITIES: No cyanosis, clubbing or edema b/l.    NEUROLOGIC: Cranial nerves II through XII are intact. No focal Motor or sensory deficits b/l.   PSYCHIATRIC:  patient is alert and oriented x2, little anxious SKIN: No obvious rash, lesion, or ulcer.   LABORATORY PANEL:  CBC Recent Labs  Lab 08/30/19 0651  WBC 5.9  HGB 14.6  HCT 43.9  PLT 185    Chemistries  Recent Labs  Lab 08/30/19 0651  NA 141  K 3.8  CL 108  CO2 27  GLUCOSE 88  BUN 8  CREATININE 0.69  CALCIUM 8.4*  MG 1.9  AST 18  ALT 12  ALKPHOS 52  BILITOT 0.7   Cardiac Enzymes No results for input(s): TROPONINI in the last 168 hours. RADIOLOGY:  No results found. ASSESSMENT AND PLAN:  Rodney Watson is a 54 y.o. male with a known history of seizure disorder, EtOH use disorder, hypertension, hypothyroidism depression, COPD presents to the emergency department for evaluation of altered mental status, questionable overdose.  Patient was in a usual state of health until earlier today he was found altered and less responsive, outside of the church where he is staying.  patient states that he took 9 tabs of 100 mg of Benadryl and drank 2 beers.  #.Acute metabolic encephalopathy due to ? Dilantin overdose/ Benadryl overdose  -per patient he has done this in the past "to get high" -Serial Dilantin 44--33--32 -- hold Atarax and Dilantin - received IV fluids -Seizure precautions -NSR on telemonitor--d/ced -denies SI however  given multiple ER and admission at [facility] for similar reason consult psych--Dr rao notified  #.  History of seizure disorder -Continue Neurontin -hold dilantin  #.  History of EtOH use disorder -CIWA protocol -Oral vitamins -Fall and aspiration precaution  #.  History of COPD -Continue inhalers prn  #.  History of hypothyroidism - Continue Synthroid  #. History of hypertension - Continue lisinopril  #. History of hyperlipidemia - Continue Zocor  Admission status: Patient, telemetry Diet/Nutrition: Heart healthy Consults called: None DVT Px: Early ambulation. Code Status: Full Code  Disposition Plan: To home in 1-2 days once phenytoin levels are within limits TOC for d/c planning  Status is: inpatient  Dispo:              Anticipated d/c date is: 1-2 days              Patient currently is not medically stable to d/c.given elevated dilantin levels, psych consult pending        TOTAL TIME TAKING CARE OF THIS PATIENT: 25 minutes.  >50% time spent on counselling and coordination of care  Note: This dictation was prepared with Dragon dictation along with smaller phrase technology. Any transcriptional errors that result from this process are unintentional.  Enedina Finner M.D    [facility]   CC: Primary care physician; Evelene Croon, MDPatient ID: Rodney Watson, male   DOB: 1965/04/07, 54 y.o.   MRN: [id number]

## 2019-08-31 NOTE — TOC Initial Note (Signed)
Transition of Care [facility]) - Initial/Assessment Note    Patient Details  Name: Rodney Watson MRN: [id number] Date of Birth: 01-30-1966  [facility] CM/SW Contact:    Chapman Fitch, RN Phone Number: 08/31/2019, 2:28 PM  Clinical Narrative:                 Patient admitted with unintentional overdose Patient states that he lives at [facility] and has been there for 2 years Patient request that I update his emergency contact information with his Valma Cava.  Updated  Patient states that he is independent.  PCP Lacie Scotts - uses medication transportation Pharmacy - [facility], denies issues obtaining medications.  States that pharmacy delivers the medications for a $2 fee  Patient states that he receives $752 a month in SSI.  Pays the ministry program $400 a month.   RNCM discussed substance abuse resources with patient. He declines.  Denies use of alcohol.  Does state that he smokes mariajuana, but does not feel that he needs resources.    Please re consult if indicated    Expected Discharge Plan: Home/Self Care Barriers to Discharge: Continued Medical Work up   Patient Goals and CMS Choice Patient states their goals for this hospitalization and ongoing recovery are:: to get out of the hospital      Expected Discharge Plan and Services Expected Discharge Plan: Home/Self Care   Discharge Planning Services: CM Consult   Living arrangements for the past 2 months:  [employer] program)                                      Prior Living Arrangements/Services Living arrangements for the past 2 months:  [employer] program)   Patient language and need for interpreter reviewed:: Yes Do you feel safe going back to the place where you live?: Yes      Need for Family Participation in Patient Care: No (Comment) Care giver support system in place?: Yes (comment)   Criminal Activity/Legal Involvement Pertinent to Current  Situation/Hospitalization: No - Comment as needed  Activities of Daily Living Home Assistive Devices/Equipment: None ADL Screening (condition at time of admission) Patient's cognitive ability adequate to safely complete daily activities?: Yes Is the patient deaf or have difficulty hearing?: No Does the patient have difficulty seeing, even when wearing glasses/contacts?: No Does the patient have difficulty concentrating, remembering, or making decisions?: No Patient able to express need for assistance with ADLs?: Yes Does the patient have difficulty dressing or bathing?: No Independently performs ADLs?: Yes (appropriate for developmental age) Does the patient have difficulty walking or climbing stairs?: No Weakness of Legs: None Weakness of Arms/Hands: None  Permission Sought/Granted                  Emotional Assessment Appearance:: Appears stated age     Orientation: : Oriented to Self, Oriented to Place, Oriented to  Time, Oriented to Situation Alcohol / Substance Use: Illicit Drugs    Admission diagnosis:  Accidental phenytoin poisoning, initial encounter [T42.0X1A] Phenytoin overdose, accidental or unintentional, initial encounter [T42.0X1A] Patient Active Problem List   Diagnosis Date Noted  . Acute metabolic encephalopathy   . Anxiety   . Accidental phenytoin poisoning 08/29/2019  . Medication overdose   . Seizures (HCC) 06/30/2018  . Severe recurrent major depression without psychotic features (HCC) 10/25/2017  . Suicide attempt (HCC) 10/24/2017  . Major depressive  disorder, recurrent episode, severe (HCC) 09/29/2017  . Alcohol withdrawal (HCC) 07/18/2017  . Seizure disorder (HCC) 07/18/2017  . Essential hypertension 07/18/2017  . Alcohol abuse with alcohol-induced mood disorder (HCC) 04/10/2017  . ETOH abuse 04/01/2017   PCP:  Evelene Croon, MD Pharmacy:   [facility] [place], [place] - [address] [place] [address] Phone:  [phone] Fax: [phone]     Social Determinants of Health (SDOH) Interventions    Readmission Risk Interventions Readmission Risk Prevention Plan 06/30/2018  Transportation Screening Complete  Palliative Care Screening Not Applicable  Medication Review (RN Care Manager) Complete  Some recent data might be hidden

## 2019-09-01 DIAGNOSIS — G40909 Epilepsy, unspecified, not intractable, without status epilepticus: Secondary | ICD-10-CM

## 2019-09-01 LAB — PHENYTOIN LEVEL, TOTAL: Phenytoin Lvl: 29.3 ug/mL — ABNORMAL HIGH (ref 10.0–20.0)

## 2019-09-01 MED ORDER — HYDROXYZINE HCL 10 MG PO TABS
10.0000 mg | ORAL_TABLET | Freq: Two times a day (BID) | ORAL | Status: DC | PRN
  Administered 2019-09-01: 10 mg via ORAL
  Filled 2019-09-01 (×3): qty 1

## 2019-09-01 MED ORDER — ENSURE ENLIVE PO LIQD: 237.0000 mL | Freq: Three times a day (TID) | ORAL | 12 refills | Status: DC

## 2019-09-01 MED ORDER — ADULT MULTIVITAMIN W/MINERALS CH: 1.0000 | ORAL_TABLET | Freq: Every day | ORAL | 0 refills | Status: DC

## 2019-09-01 MED ORDER — PHENYTOIN SODIUM EXTENDED 100 MG PO CAPS: 100.0000 mg | ORAL_CAPSULE | Freq: Three times a day (TID) | ORAL | 0 refills | Status: AC

## 2019-09-01 NOTE — Discharge Instructions (Signed)
Pt advised NOT to take benadryl He is advised to resume Dilantin from tonite

## 2019-09-01 NOTE — Discharge Summary (Signed)
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 Alcohol abuse   . Alcohol abuse 04/01/2017  . COPD (chronic obstructive pulmonary disease) (HCC)   . Crack cocaine use   . Depression   . ETOH abuse   . Hypothyroidism   . Seizures [facility])     HOSPITAL COURSE:  Rodney Watson a 54 y.o.malewith a known history of seizure disorder, EtOH use disorder, hypertension, hypothyroidism depression, COPDpresents to the emergency department for evaluation of altered mental status, questionable overdose. Patient was in a usual state of health until earlier todayhe was found altered and less responsive,[place] [facility]. patient states that he took 9 tabs of 100 mg of Benadryl and drank 2 beers.  #.Acute metabolic encephalopathy due to Accidental Dilantin overdose/Benadryl overdose  -per patient he has done this in the past "to get high" with benadryl and he accidentally took several dilantin pills along with it -Serial Dilantin 44--33--32--29 --resume Atrax for anxiety--chronic -resume dilantin from tonite dose--100 mg tid -Seizure precautions -denies SI however given multiple ER and admission at [facility] for similar reason consult psych--Dr Guinevere Scarlet pt no new recommendations--cont present meds.  -pt advised not to take benadryl  #.History of  seizure disorder -Continue Neurontin -resume dilantin from tonite after d/c  #.History of EtOH use disorder -CIWA protocol--scored low--has chronic anxiety -Oral vitamins  #.History of COPD -Continue inhalers prn  #. History ofhypothyroidism - ContinueSynthroid  #. History ofhypertension - Continuelisinopril  #. History ofhyperlipidemia - ContinueZocor  Admission status:Patient, telemetry Diet/Nutrition:Heart healthy Consults called:None DVT DG:UYQIH ambulation. Code Status:Full Code Disposition Plan:[facility] [employer] [facility]--spoke with Demetrios Isaacs on the phone TOC for d/c planning  Status is: inpatient  Dispo:  Anticipated d/c date is: today  Patient currently is medically stable to d/c.  CONSULTS OBTAINED:  Treatment Team:  Roselind Messier, MD  DRUG ALLERGIES:   Allergies  Allergen Reactions  . Chocolate Hives  . Chocolate Flavor   . Vicodin [Hydrocodone-Acetaminophen] Nausea And Vomiting    DISCHARGE MEDICATIONS:   Allergies as of 09/01/2019      Reactions   Chocolate Hives   Chocolate Flavor    Vicodin [hydrocodone-acetaminophen] Nausea And Vomiting      Medication List    STOP taking these medications   naltrexone 50 MG tablet Commonly known as: DEPADE     TAKE these medications   albuterol 108 (90 Base) MCG/ACT inhaler Commonly known as: VENTOLIN HFA Inhale 2 puffs into the lungs every 4 (four) hours as needed for wheezing or shortness of breath.   feeding supplement (ENSURE ENLIVE) Liqd Take 237 mLs by mouth 3 (three) times daily between meals.   gabapentin 300 MG capsule Commonly known as: NEURONTIN Take 1 capsule (300 mg total) by mouth 3 (three) times daily.   hydrOXYzine 10 MG tablet Commonly known as: ATARAX/VISTARIL Take 10 mg by mouth at bedtime.   levothyroxine 25 MCG tablet Commonly known as:  SYNTHROID Take 1 tablet (25 mcg total) by mouth daily before breakfast.    lisinopril 10 MG tablet Commonly known as: ZESTRIL Take 10 mg by mouth daily at 6 (six) AM.   meloxicam 7.5 MG tablet Commonly known as: MOBIC Take 7.5 mg by mouth daily.   multivitamin with minerals Tabs tablet Take 1 tablet by mouth daily. Start taking on: September 02, 2019   phenytoin 100 MG ER capsule Commonly known as: DILANTIN Take 1 capsule (100 mg total) by mouth 3 (three) times daily. What changed: when to take this Notes to patient: Start from tonite   simvastatin 20 MG tablet Commonly known as: ZOCOR Take 20 mg by mouth daily.   Stiolto Respimat 2.5-2.5 MCG/ACT Aers Generic drug: Tiotropium Bromide-Olodaterol Inhale 2 puffs into the lungs daily.       If you experience worsening of your admission symptoms, develop shortness of breath, life threatening emergency, suicidal or homicidal thoughts you must seek medical attention immediately by calling 911 or calling your MD immediately  if symptoms less severe.  You Must read complete instructions/literature along with all the possible adverse reactions/side effects for all the Medicines you take and that have been prescribed to you. Take any new Medicines after you have completely understood and accept all the possible adverse reactions/side effects.   Please note  You were cared for by a hospitalist during your hospital stay. If you have any questions about your discharge medications or the care you received while you were in the hospital after you are discharged, you can call the unit and asked to speak with the hospitalist on call if the hospitalist that took care of you is not available. Once you are discharged, your primary care physician will handle any further medical issues. Please note that NO REFILLS for any discharge medications will be authorized once you are discharged, as it is imperative that you return to your primary care physician (or establish a relationship with a primary care physician if you do not have  one) for your aftercare needs so that they can reassess your need for medications and monitor your lab values. Today   SUBJECTIVE   chornic anxiety Overall feels well. Wants to get back to his church and get to do his routine work there   VITAL SIGNS:  Blood pressure (!) 135/96, pulse 70, temperature 98 F (36.7 C), temperature source Oral, resp. rate 16, height 5\' 8"  (1.727 m), weight 56.1 kg, SpO2 99 %.  I/O:    Intake/Output Summary (Last 24 hours) at 09/01/2019 1043 Last data filed at 09/01/2019 0900 Gross per 24 hour  Intake 360 ml  Output 1150 ml  Net -790 ml    PHYSICAL EXAMINATION:  GENERAL:  54 y.o.-year-old patient lying in the bed with no acute distress.  EYES: Pupils equal, round, reactive to light and accommodation. No scleral icterus.  HEENT: Head atraumatic, normocephalic. Oropharynx and nasopharynx clear.  NECK:  Supple, no jugular venous distention. No thyroid enlargement, no tenderness.  LUNGS: Normal breath sounds bilaterally, no wheezing, rales,rhonchi or crepitation. No use of accessory muscles of respiration.  CARDIOVASCULAR: S1, S2 normal. No murmurs, rubs, or gallops.  ABDOMEN: Soft, non-tender, non-distended. Bowel sounds present. No organomegaly or mass.  EXTREMITIES: No pedal edema, cyanosis, or clubbing.  NEUROLOGIC: Cranial nerves II through XII are intact. Muscle strength 5/5 in all extremities. Sensation intact. Gait not checked.  PSYCHIATRIC: The patient is alert and oriented x 3. Chronic anxiety SKIN: No obvious rash, lesion, or  ulcer.   DATA REVIEW:   CBC  Recent Labs  Lab 08/30/19 0651  WBC 5.9  HGB 14.6  HCT 43.9  PLT 185    Chemistries  Recent Labs  Lab 08/30/19 0651  NA 141  K 3.8  CL 108  CO2 27  GLUCOSE 88  BUN 8  CREATININE 0.69  CALCIUM 8.4*  MG 1.9  AST 18  ALT 12  ALKPHOS 52  BILITOT 0.7    Microbiology Results   Recent Results (from the past 240 hour(s))  SARS Coronavirus 2 by RT PCR (hospital order,  performed in [facility] hospital lab) Nasopharyngeal Nasopharyngeal Swab     Status: None   Collection Time: 08/29/19 10:39 PM   Specimen: Nasopharyngeal Swab  Result Value Ref Range Status   SARS Coronavirus 2 NEGATIVE NEGATIVE Final    Comment: (NOTE) SARS-CoV-2 target nucleic acids are NOT DETECTED.  The SARS-CoV-2 RNA is generally detectable in upper and lower respiratory specimens during the acute phase of infection. The lowest concentration of SARS-CoV-2 viral copies this assay can detect is 250 copies / mL. A negative result does not preclude SARS-CoV-2 infection and should not be used as the sole basis for treatment or other patient management decisions.  A negative result may occur with improper specimen collection / handling, submission of specimen other than nasopharyngeal swab, presence of viral mutation(s) within the areas targeted by this assay, and inadequate number of viral copies (<250 copies / mL). A negative result must be combined with clinical observations, patient history, and epidemiological information.  Fact Sheet for Patients:   BoilerBrush.com.cy  Fact Sheet for Healthcare Providers: https://pope.com/  This test is not yet approved or  cleared by the [place] FDA and has been authorized for detection and/or diagnosis of SARS-CoV-2 by FDA under an Emergency Use Authorization (EUA).  This EUA will remain in effect (meaning this test can be used) for the duration of the COVID-19 declaration under Section 564(b)(1) of the Act, 21 U.S.C. section 360bbb-3(b)(1), unless the authorization is terminated or revoked sooner.  Performed at [facility], [address], [place], [place] [address]     RADIOLOGY:  No results found.   CODE STATUS:     Code Status Orders  (From admission, onward)         Start     Ordered   08/29/19 2359  Full code  Continuous        08/30/19 0000         Code Status History    Date Active Date Inactive Code Status Order ID Comments User Context   06/30/2018 0934 07/02/2018 1918 Full Code [id number]  Campbell Stall, MD Inpatient   01/21/2018 0631 01/23/2018 1603 Full Code [id number]  Arnaldo Natal, MD Inpatient   10/25/2017 0807 10/29/2017 1603 Full Code [id number]  Audery Amel, MD Inpatient   09/29/2017 2218 10/04/2017 1201 Full Code [id number]  Darliss Ridgel, MD Inpatient   09/23/2017 0231 09/24/2017 1532 Full Code [id number]  Barbaraann Rondo, MD ED   08/08/2017 1849 08/09/2017 1131 Full Code [id number]  Jeannie Fend, PA-C ED   07/18/2017 1225 07/19/2017 1353 Full Code [id number]  Narda Bonds, MD ED   06/29/2017 1826 07/03/2017 1458 Full Code [id number]  Laveda Abbe, NP Inpatient   06/28/2017 0620 06/29/2017 1732 Full Code [id number]  Ward, Layla Maw, DO ED   06/16/2017 1830 06/17/2017 1638 Full Code [id number]  Ward, Chase Picket, PA-C ED   06/01/2017 2239  06/02/2017 1653 Full Code [id number]  Rolland Porter, MD ED   05/16/2017 0159 05/20/2017 1454 Full Code [id number]  Kerry Hough, PA-C Inpatient   05/15/2017 1804 05/16/2017 0126 Full Code [id number]  Dietrich Pates, PA-C ED   05/11/2017 1902 05/12/2017 1616 Full Code [id number]  Maxwell Caul, PA-C ED   04/13/2017 1258 04/16/2017 2139 Full Code [id number]  Delila Pereyra, NP Inpatient   04/12/2017 0048 04/13/2017 1223 Full Code [id number]  Muthersbaugh, Boyd Kerbs ED   04/09/2017 2323 04/10/2017 1654 Full Code [id number]  Ward, Chase Picket, PA-C ED   04/01/2017 1720 04/06/2017 2236 Full Code [id number]  Oneta Rack, NP Inpatient   03/31/2017 0516 04/01/2017 1530 Full Code [id number]  Glynn Octave, MD ED   Advance Care Planning Activity       TOTAL TIME TAKING CARE OF THIS PATIENT: *35* minutes.    Enedina Finner M.D  [facility]    CC: Primary care physician; Evelene Croon, MD

## 2019-11-16 ENCOUNTER — Emergency Department
Admission: EM | Admit: 2019-11-16 | Discharge: 2019-11-16 | Discharge status: Against Medical Advice | Payer: Medicaid Other

## 2019-11-16 NOTE — ED Notes (Signed)
Pt refused treatment. Pt spoke to Dr. Vicente Males and said he is free to go. I have pulled IV before pt has left.

## 2019-11-16 NOTE — ED Notes (Signed)
Pt states he is wanting to go. Pt denies any SI or HI. Pt states he jsut wants to go home. MD Vicente Males called to come and speak with pt

## 2019-11-16 NOTE — ED Notes (Signed)
Pt escorted out to ED waiting room. Pt ok to leave per MD Bradler. Will inform First nurse Erskine Squibb

## 2019-11-16 NOTE — ED Triage Notes (Addendum)
First Nurse Note:  Patient had a witnessed fall.  Has taken 8 dilantin tablets on purpose.  Per report, patient having SI.  Patient placed in the center of triage for safety.

## 2022-07-27 ENCOUNTER — Emergency Department
Admission: EM | Admit: 2022-07-27 | Discharge: 2022-07-27 | Disposition: A | Discharge status: Home / Self Care | Payer: Medicaid Other | Attending: Emergency Medicine | Admitting: Emergency Medicine

## 2022-07-27 ENCOUNTER — Emergency Department: Payer: Medicaid Other

## 2022-07-27 ENCOUNTER — Other Ambulatory Visit: Payer: Self-pay

## 2022-07-27 DIAGNOSIS — J449 Chronic obstructive pulmonary disease, unspecified: Secondary | ICD-10-CM | POA: Diagnosis not present

## 2022-07-27 DIAGNOSIS — S6991XA Unspecified injury of right wrist, hand and finger(s), initial encounter: Secondary | ICD-10-CM | POA: Diagnosis present

## 2022-07-27 DIAGNOSIS — S61209A Unspecified open wound of unspecified finger without damage to nail, initial encounter: Secondary | ICD-10-CM

## 2022-07-27 DIAGNOSIS — W010XXA Fall on same level from slipping, tripping and stumbling without subsequent striking against object, initial encounter: Secondary | ICD-10-CM | POA: Diagnosis not present

## 2022-07-27 DIAGNOSIS — E039 Hypothyroidism, unspecified: Secondary | ICD-10-CM | POA: Diagnosis not present

## 2022-07-27 DIAGNOSIS — S61011A Laceration without foreign body of right thumb without damage to nail, initial encounter: Secondary | ICD-10-CM | POA: Diagnosis not present

## 2022-07-27 DIAGNOSIS — S62521A Displaced fracture of distal phalanx of right thumb, initial encounter for closed fracture: Secondary | ICD-10-CM | POA: Insufficient documentation

## 2022-07-27 LAB — BASIC METABOLIC PANEL
Anion gap: 9 (ref 5–15)
BUN: 11 mg/dL (ref 6–20)
CO2: 29 mmol/L (ref 22–32)
Calcium: 8.8 mg/dL — ABNORMAL LOW (ref 8.9–10.3)
Chloride: 99 mmol/L (ref 98–111)
Creatinine, Ser: 0.73 mg/dL (ref 0.61–1.24)
GFR, Estimated: >60 mL/min (ref >60)
Glucose, Bld: 212 mg/dL — ABNORMAL HIGH (ref 70–99)
Potassium: 4 mmol/L (ref 3.5–5.1)
Sodium: 137 mmol/L (ref 135–145)

## 2022-07-27 LAB — CBC WITH DIFFERENTIAL/PLATELET
Abs Immature Granulocytes: 0.01 10*3/uL (ref 0.00–0.07)
Basophils Absolute: 0.1 10*3/uL (ref 0.0–0.1)
Basophils Relative: 1 %
Eosinophils Absolute: 0.1 10*3/uL (ref 0.0–0.5)
Eosinophils Relative: 2 %
HCT: 45.8 % (ref 39.0–52.0)
Hemoglobin: 15 g/dL (ref 13.0–17.0)
Immature Granulocytes: 0 %
Lymphocytes Relative: 29 %
Lymphs Abs: 2.1 10*3/uL (ref 0.7–4.0)
MCH: 29 pg (ref 26.0–34.0)
MCHC: 32.8 g/dL (ref 30.0–36.0)
MCV: 88.6 fL (ref 80.0–100.0)
Monocytes Absolute: 0.6 10*3/uL (ref 0.1–1.0)
Monocytes Relative: 8 %
Neutro Abs: 4.4 10*3/uL (ref 1.7–7.7)
Neutrophils Relative %: 60 %
Platelets: 238 10*3/uL (ref 150–400)
RBC: 5.17 MIL/uL (ref 4.22–5.81)
RDW: 14.9 % (ref 11.5–15.5)
WBC: 7.2 10*3/uL (ref 4.0–10.5)
nRBC: 0 % (ref 0.0–0.2)

## 2022-07-27 MED ORDER — CEFAZOLIN SODIUM-DEXTROSE 1-4 GM/50ML-% IV SOLN
1.0000 g | Freq: Once | INTRAVENOUS | Status: AC
  Administered 2022-07-27: 1 g via INTRAVENOUS
  Filled 2022-07-27: qty 50

## 2022-07-27 MED ORDER — MIDAZOLAM HCL 2 MG/2ML IJ SOLN
2.0000 mg | Freq: Once | INTRAMUSCULAR | Status: AC
  Administered 2022-07-27: 2 mg via INTRAVENOUS
  Filled 2022-07-27: qty 2

## 2022-07-27 MED ORDER — TETANUS-DIPHTH-ACELL PERTUSSIS 5-2.5-18.5 LF-MCG/0.5 IM SUSY: 0.5000 mL | PREFILLED_SYRINGE | Freq: Once | INTRAMUSCULAR | Status: DC

## 2022-07-27 MED ORDER — CEPHALEXIN 500 MG PO CAPS: 500.0000 mg | ORAL_CAPSULE | Freq: Four times a day (QID) | ORAL | 0 refills | Status: AC

## 2022-07-27 MED ORDER — LIDOCAINE HCL (PF) 1 % IJ SOLN
10.0000 mL | Freq: Once | INTRAMUSCULAR | Status: AC
  Administered 2022-07-27: 10 mL via INTRADERMAL
  Filled 2022-07-27: qty 10

## 2022-07-27 MED ORDER — MIDAZOLAM HCL 2 MG/2ML IJ SOLN
2.0000 mg | Freq: Once | INTRAMUSCULAR | Status: DC
  Filled 2022-07-27: qty 2

## 2022-07-27 MED ORDER — OXYCODONE-ACETAMINOPHEN 5-325 MG PO TABS: 1.0000 | ORAL_TABLET | Freq: Four times a day (QID) | ORAL | 0 refills | Status: AC | PRN

## 2022-07-27 MED ORDER — OXYCODONE-ACETAMINOPHEN 5-325 MG PO TABS
1.0000 | ORAL_TABLET | Freq: Once | ORAL | Status: AC
  Administered 2022-07-27: 1 via ORAL
  Filled 2022-07-27: qty 1

## 2022-07-27 MED ORDER — HYDROMORPHONE HCL 1 MG/ML IJ SOLN
1.0000 mg | Freq: Once | INTRAMUSCULAR | Status: AC
  Administered 2022-07-27: 1 mg via INTRAVENOUS
  Filled 2022-07-27: qty 1

## 2022-07-27 NOTE — ED Provider Notes (Signed)
[facility] Provider Note    Event Date/Time   First MD Initiated Contact with Patient 07/27/22 1000     (approximate)   History   Finger Injury (Right thumb)   HPI  Rodney Watson is a 57 y.o. male with a history of COPD, seizure disorder, hypothyroidism, and alcohol abuse who presents with a right thumb injury, acute onset when the patient was chasing after his dog and a fan fell and cut through the right thumb.  The patient denies any other injuries.  I reviewed the past medical records.  The patient was admitted in 2021 with metabolic encephalopathy due to Dilantin and Benadryl overdose.   Physical Exam   Triage Vital Signs: ED Triage Vitals  Enc Vitals Group     BP 07/27/22 1004 (!) 165/99     Pulse Rate 07/27/22 1004 83     Resp 07/27/22 1004 17     Temp 07/27/22 1004 98.1 F (36.7 C)     Temp Source 07/27/22 1004 Oral     SpO2 07/27/22 1004 99 %     Weight 07/27/22 0956 120 lb (54.4 kg)     Height 07/27/22 0956 5\' 8"  (1.727 m)     Head Circumference --      Peak Flow --      Pain Score 07/27/22 0956 10     Pain Loc --      Pain Edu? --      Excl. in GC? --     Most recent vital signs: Vitals:   07/27/22 1112 07/27/22 1113  BP:    Pulse: 72 71  Resp: 12 17  Temp:    SpO2: 94% 94%     General: Awake, no distress.  CV:  Good peripheral perfusion.  Resp:  Normal effort.  Abd:  No distention.  Other:  Right thumb with laceration/avulsion through medial side approximately middle of distal phalanx at the level of the nail fold, two thirds through the thickness of the thumb.     ED Results / Procedures / Treatments   Labs (all labs ordered are listed, but only abnormal results are displayed) Labs Reviewed  BASIC METABOLIC PANEL - Abnormal; Notable for the following components:      Result Value   Glucose, Bld 212 (*)    Calcium 8.8 (*)    All other components within normal limits  CBC WITH DIFFERENTIAL/PLATELET      EKG     RADIOLOGY  XR R thumb: Independently viewed and interpreted the images; there is a first distal phalanx fracture   PROCEDURES:  Critical Care performed: No  ..Laceration Repair  Date/Time: 07/27/2022 12:39 PM  Performed by: Dionne Bucy, MD Authorized by: Dionne Bucy, MD   Consent:    Consent obtained:  Verbal   Consent given by:  Patient   Risks discussed:  Infection, pain, need for additional repair, tendon damage, nerve damage, poor wound healing and vascular damage   Alternatives discussed:  No treatment and referral Universal protocol:    Patient identity confirmed:  Verbally with patient Anesthesia:    Anesthesia method:  Nerve block   Block location:  L thumb   Block anesthetic:  Lidocaine 1% w/o epi   Block technique:  Digital   Block outcome:  Anesthesia achieved Laceration details:    Location:  Finger   Finger location:  R thumb   Length (cm):  3   Depth (mm):  10 Treatment:    Area  cleansed with:  Povidone-iodine and saline   Amount of cleaning:  Extensive   Irrigation solution:  Sterile saline   Irrigation method:  Syringe   Layers/structures repaired:  Deep dermal/superficial fascia Deep dermal/superficial fascia:    Suture size:  5-0   Suture material:  Vicryl   Suture technique:  Horizontal mattress   Number of sutures:  1 Skin repair:    Repair method:  Sutures   Suture size:  4-0   Suture material:  Nylon   Suture technique:  Simple interrupted   Number of sutures:  8 Approximation:    Approximation:  Close Repair type:    Repair type:  Complex Post-procedure details:    Dressing:  Sterile dressing   Procedure completion:  Tolerated well, no immediate complications .Ortho Injury Treatment  Date/Time: 07/27/2022 12:40 PM  Performed by: Dionne Bucy, MD Authorized by: Dionne Bucy, MD   Consent:    Consent obtained:  Verbal   Consent given by:  PatientInjury location: finger Location  details: right thumb Injury type: fracture Fracture type: distal phalanx MCP joint involved: no IP joint involved: no Pre-procedure distal perfusion: normal  Anesthesia: Local anesthesia used: yes  Patient sedated: NoManipulation performed: yes Skin traction used: yes Immobilization: splint Splint type: static finger Splint Applied by: ED Provider Supplies used: aluminum splint Post-procedure neurovascular assessment: post-procedure neurovascularly intact      MEDICATIONS ORDERED IN ED: Medications  Tdap (BOOSTRIX) injection 0.5 mL (0.5 mLs Intramuscular Patient Refused/Not Given 07/27/22 1026)  ceFAZolin (ANCEF) IVPB 1 g/50 mL premix (0 g Intravenous Stopped 07/27/22 1058)  HYDROmorphone (DILAUDID) injection 1 mg (1 mg Intravenous Given 07/27/22 1016)  lidocaine (PF) (XYLOCAINE) 1 % injection 10 mL (10 mLs Intradermal Given 07/27/22 1133)  midazolam (VERSED) injection 2 mg (2 mg Intravenous Given 07/27/22 1057)  oxyCODONE-acetaminophen (PERCOCET/ROXICET) 5-325 MG per tablet 1 tablet (1 tablet Oral Given 07/27/22 1232)     IMPRESSION / MDM / ASSESSMENT AND PLAN / ED COURSE  I reviewed the triage vital signs and the nursing notes.  57 year old male with PMH as noted above presents with accidental partial amputation of distal right thumb approximately through the IP joint area.  He has no other injuries.  Differential diagnosis includes, but is not limited to, partial amputation.  We will obtain x-ray, give analgesia, IV antibiotics, consult orthopedics, and reassess.  Patient's presentation is most consistent with acute presentation with potential threat to life or bodily function.  ----------------------------------------- 12:38 PM on 07/27/2022 -----------------------------------------  I consulted Dr. Joice Lofts from orthopedics who recommended doing a laceration repair taking care to tuck the nail into the nail fold and placing a splint.  He recommends close follow-up in the  orthopedic office.  I gave the patient a small dose of IV Versed for anxiolysis.  He did not require moderate sedation.  Anesthesia was achieved via digital block.  The patient tolerated the procedure well.  I counseled the patient on the plan of care including dressing changes and need for prompt Ortho follow-up.  I have prescribed Keflex and Percocet for pain.  I gave the patient strict return precautions and he expressed understanding.   FINAL CLINICAL IMPRESSION(S) / ED DIAGNOSES   Final diagnoses:  Avulsion of fingertip, initial encounter     Rx / DC Orders   ED Discharge Orders          Ordered    cephALEXin (KEFLEX) 500 MG capsule  4 times daily        07/27/22 1237  oxyCODONE-acetaminophen (PERCOCET) 5-325 MG tablet  Every 6 hours PRN        07/27/22 1237             Note:  This document was prepared using Dragon voice recognition software and may include unintentional dictation errors.    Dionne Bucy, MD 07/27/22 [phone]

## 2022-07-27 NOTE — Discharge Instructions (Addendum)
Take the antibiotic as prescribed and finish the full course.  Use the Percocet as needed for pain over the next few days and then transition to over-the-counter Tylenol.  Follow-up with orthopedics within the next week.  Keep the dressing on for the next day with the splint on.  You may then change the dressing but put the splint back on with tape to hold the thumb study.  Return to the ER for new, worsening, or persistent severe pain, swelling, bleeding, pus drainage, redness going up the arm, fever or chills, or any other new or worsening symptoms that concern you.

## 2022-07-27 NOTE — ED Triage Notes (Signed)
Pt states he tripped and a fan landed on his right thumb. Pt states pain. Partial amputation noted. Possible open fracture.  Pt states tdap is up to date.

## 2023-07-21 DEATH — deceased
